# Patient Record
Sex: Female | Born: 1950
Health system: Southern US, Community
[De-identification: ages and names within clinical notes are randomized; demographics above are authoritative.]

## PROBLEM LIST (undated history)

## (undated) DIAGNOSIS — E739 Lactose intolerance, unspecified: Secondary | ICD-10-CM

## (undated) DIAGNOSIS — Z87898 Personal history of other specified conditions: Secondary | ICD-10-CM

## (undated) DIAGNOSIS — M8580 Other specified disorders of bone density and structure, unspecified site: Secondary | ICD-10-CM

## (undated) DIAGNOSIS — G5763 Lesion of plantar nerve, bilateral lower limbs: Secondary | ICD-10-CM

## (undated) DIAGNOSIS — Z860100 Personal history of colon polyps, unspecified: Secondary | ICD-10-CM

## (undated) DIAGNOSIS — M5416 Radiculopathy, lumbar region: Secondary | ICD-10-CM

## (undated) DIAGNOSIS — G8929 Other chronic pain: Secondary | ICD-10-CM

## (undated) DIAGNOSIS — M722 Plantar fascial fibromatosis: Secondary | ICD-10-CM

## (undated) DIAGNOSIS — Z9889 Other specified postprocedural states: Secondary | ICD-10-CM

## (undated) DIAGNOSIS — I351 Nonrheumatic aortic (valve) insufficiency: Secondary | ICD-10-CM

## (undated) DIAGNOSIS — M1990 Unspecified osteoarthritis, unspecified site: Secondary | ICD-10-CM

## (undated) DIAGNOSIS — Z973 Presence of spectacles and contact lenses: Secondary | ICD-10-CM

## (undated) DIAGNOSIS — N200 Calculus of kidney: Secondary | ICD-10-CM

## (undated) DIAGNOSIS — M545 Low back pain, unspecified: Secondary | ICD-10-CM

## (undated) DIAGNOSIS — E785 Hyperlipidemia, unspecified: Secondary | ICD-10-CM

## (undated) DIAGNOSIS — N898 Other specified noninflammatory disorders of vagina: Secondary | ICD-10-CM

## (undated) DIAGNOSIS — H269 Unspecified cataract: Secondary | ICD-10-CM

## (undated) DIAGNOSIS — R112 Nausea with vomiting, unspecified: Secondary | ICD-10-CM

## (undated) DIAGNOSIS — G4700 Insomnia, unspecified: Secondary | ICD-10-CM

## (undated) DIAGNOSIS — G629 Polyneuropathy, unspecified: Secondary | ICD-10-CM

## (undated) DIAGNOSIS — I700 Atherosclerosis of aorta: Secondary | ICD-10-CM

## (undated) DIAGNOSIS — R011 Cardiac murmur, unspecified: Secondary | ICD-10-CM

## (undated) DIAGNOSIS — F419 Anxiety disorder, unspecified: Secondary | ICD-10-CM

## (undated) DIAGNOSIS — M25569 Pain in unspecified knee: Secondary | ICD-10-CM

## (undated) DIAGNOSIS — I493 Ventricular premature depolarization: Secondary | ICD-10-CM

## (undated) DIAGNOSIS — B009 Herpesviral infection, unspecified: Secondary | ICD-10-CM

## (undated) HISTORY — DX: Hyperlipidemia, unspecified: E78.5

## (undated) HISTORY — DX: Anxiety disorder, unspecified: F41.9

## (undated) HISTORY — DX: Personal history of colon polyps, unspecified: Z86.0100

## (undated) HISTORY — DX: Personal history of colonic polyps: Z86.010

## (undated) HISTORY — PX: TONSILLECTOMY AND ADENOIDECTOMY: SUR1326

## (undated) HISTORY — PX: EXCISION MORTON'S NEUROMA: SHX5013

## (undated) HISTORY — DX: Unspecified osteoarthritis, unspecified site: M19.90

## (undated) HISTORY — DX: Insomnia, unspecified: G47.00

## (undated) HISTORY — PX: PLANTAR FASCIA RELEASE: SHX2239

## (undated) HISTORY — DX: Lactose intolerance, unspecified: E73.9

## (undated) HISTORY — DX: Other specified disorders of bone density and structure, unspecified site: M85.80

---

## 1972-03-15 HISTORY — PX: TOTAL ABDOMINAL HYSTERECTOMY: SHX209

## 1999-04-10 ENCOUNTER — Other Ambulatory Visit: Admission: RE | Admit: 1999-04-10 | Discharge: 1999-04-10 | Payer: Self-pay | Admitting: *Deleted

## 2004-12-29 ENCOUNTER — Ambulatory Visit: Payer: Self-pay | Admitting: Pulmonary Disease

## 2005-01-19 ENCOUNTER — Ambulatory Visit: Payer: Self-pay | Admitting: Pulmonary Disease

## 2009-02-20 ENCOUNTER — Ambulatory Visit (HOSPITAL_BASED_OUTPATIENT_CLINIC_OR_DEPARTMENT_OTHER): Admission: RE | Admit: 2009-02-20 | Discharge: 2009-02-20 | Payer: Self-pay | Admitting: Orthopedic Surgery

## 2009-05-08 ENCOUNTER — Ambulatory Visit (HOSPITAL_BASED_OUTPATIENT_CLINIC_OR_DEPARTMENT_OTHER): Admission: RE | Admit: 2009-05-08 | Discharge: 2009-05-08 | Payer: Self-pay | Admitting: Orthopedic Surgery

## 2010-04-02 ENCOUNTER — Ambulatory Visit
Admission: RE | Admit: 2010-04-02 | Discharge: 2010-04-02 | Payer: Self-pay | Source: Home / Self Care | Attending: Orthopedic Surgery | Admitting: Orthopedic Surgery

## 2010-04-08 NOTE — Op Note (Addendum)
  NAMEREEGAN, MCTIGHE NO.:  [id number]  MEDICAL RECORD NO.:  [id number]          PATIENT TYPE:  AMB  LOCATION:  DSC                          FACILITY:  [facility]  PHYSICIAN:  Loreta Ave, M.D. DATE OF BIRTH:  1950-11-24  DATE OF PROCEDURE: DATE OF DISCHARGE:                              OPERATIVE REPORT   PREOPERATIVE DIAGNOSIS:  Chronic recalcitrant plantar fasciitis, left heel.  POSTOPERATIVE DIAGNOSIS:  Chronic recalcitrant plantar fasciitis, left heel.  PROCEDURE:  Endoscopic plantar fascia release, left heel.  SURGEON:  Loreta Ave, M.D.  ASSISTANT:  Zonia Kief, PA.  ANESTHESIA:  General.  BLOOD LOSS:  Minimal.  SPECIMENS:  None.  CULTURES:  None.  COMPLICATIONS:  None.  DRESSING:  Soft compressive with wooden shoe.  TOURNIQUET TIME:  30 minutes.  PROCEDURE:  The patient was brought to the operating room, placed on the operating table in supine position.  After adequate anesthesia had been obtained, calf tourniquet applied.  Prepped and draped in usual sterile fashion.  Exsanguinated with elevation and Esmarch.  Tourniquet was inflated to 250 mmHg.  With fluoroscopic guidance, the attachment of the plantar fascia to the os calcis identified from medial to lateral. Small incisions made on either side.  A spreading spatula was then used to clear the tissue off the plantar side of the plantar fascia, and the cannula for plantar fascia release was inserted from one side to the other.  Fluoroscopy used again to confirm good position.  Looking through the cannula with the endoscopic instruments, plantar fascia was identified.  Divided under direct visualization from medial to lateral down to the muscle layer below.  Neurovascular structures on either side protected throughout.  Once I confirmed a good release, wound was irrigated.  The cannula was removed.  Portals closed with nylon. Sterile compressive dressing applied.  Tourniquet  inflated was removed. Anesthesia reversed.  Brought to recovery room.  Tolerated surgery well. No complications.     Loreta Ave, M.D.     DFM/MEDQ  D:  04/02/2010  T:  04/03/2010  Job:  [id number]  Electronically Signed by Mckinley Jewel M.D. on 04/08/2010 04:21:47 PM

## 2010-04-30 ENCOUNTER — Encounter: Payer: Self-pay | Admitting: Sports Medicine

## 2010-05-06 ENCOUNTER — Encounter: Payer: Self-pay | Admitting: Sports Medicine

## 2010-05-06 ENCOUNTER — Encounter (INDEPENDENT_AMBULATORY_CARE_PROVIDER_SITE_OTHER): Payer: BC Managed Care – PPO | Admitting: Sports Medicine

## 2010-05-06 DIAGNOSIS — M79609 Pain in unspecified limb: Secondary | ICD-10-CM | POA: Insufficient documentation

## 2010-05-06 DIAGNOSIS — M7750 Other enthesopathy of unspecified foot: Secondary | ICD-10-CM

## 2010-05-06 DIAGNOSIS — M722 Plantar fascial fibromatosis: Secondary | ICD-10-CM | POA: Insufficient documentation

## 2010-05-12 NOTE — Assessment & Plan Note (Signed)
Summary: PER WAINER, MAY NEED ORTHOTICS, PF RELEASE IN JAN   Vital Signs:  Patient profile:   60 year old female Height:      65 inches Weight:      170 pounds BMI:     28.39 BP sitting:   111 / 66  Vitals Entered By: Lillia Pauls CMA (May 06, 2010 11:33 AM)  History of Present Illness: Patient had endoscopic plantar fascial release January 19th. Has some persisent pain and swelling but it is much improved. She started experiencing pain with all movement in April. Patient tried cortizone shot and night splint with no relief. Patient decided to have MRI and surgery rather than waiting for a year.  Patient has had 3 morton's neuroma - 2 from left and 1 removed from right At least 3 year hx of chronic bilat foot pain has tried lots of OTC inserts and pads  December 2010 - was first surgery - left foot morton's neuroma February 2011 - left and right moron's neuroma removed January 2012 - Endoscopic plantar fascial release  Patient has tried many different shoes and inserts in the past. Patient states she is not able to walk 1 mile without discomfort in left foot at this time.    Physical Exam  General:  Well-developed,well-nourished,in no acute distress; alert,appropriate and cooperative throughout examination Msk:  Hip: MS 5/5 hip flexion/extension MS 4/5 hip abduction Negative straight leg Negative FABERE  Ankle: Normal ROM in ankles b/l No joint laxity Anterior drawer negative b/l Talar tilt negative b/l  MS 5/5 Plantarflexion, Dorsiflexion, Inversion, Eversion of foot b/l  Morton's foot High longitudinal arch No visible transverse arch collapse Crowding of digit with hammering of 3rd DIP b/l bunionettes bilat some TTP under MT heads   Gait analysis: -Patient favoring left side now due to recent surgery   Impression & Recommendations:  Problem # 1:  PLANTAR FASCIITIS, LEFT (ICD-728.71) Assessment New Patient's left foot pain improving since endoscopic  surgical release. Patient with history of 2 Morton's neuromas removed on left foot and one removed on right foot. Patient with weakness of hip abduction more pronounced on left side.  Patient has several of her own insoles. Patient to try wearing 3 different insoles each with metatarsal pads over the next month. Patient to follow up in one month to determine which insole is best for her. If patient still having pain at that time will determine if custom orthotic is needed. Patient given hip strengthening exercises (hip abduction 3x30 daily, standing hip rotation 3x15 daily)  Problem # 2:  METATARSALGIA (ICD-726.70)  we tried adding MT pads to several inserts we settled on using 3 to try to see if these give relief and can stop the recurrent Morton's neuromas  Orders: Foot Orthosis ( Arch Strap/Heel Cup) [phone])  Problem # 3:  FOOT PAIN, BILATERAL (ICD-729.5) I think with all she has invested in shoe support we will try to use what she has to give her ideal support If this is successful cont with the OTC products  if not helping p 1 month try custom orthotics  Patient Instructions: 1)  Alternate red insole, tan insole and blue insole daily in work shoes to determine which insole feels the best. 2)  Try red insole in sports insole. 3)  Follow up in one month to determine which insole will be best for you. 4)  Excersises 3 sets of 30 Hip Abduction and 3 sets of 15 Standing hip rotation daily.   Orders Added:  1)  New Patient Level III [16109] 2)  Foot Orthosis ( Arch Strap/Heel Cup) [phone]

## 2010-06-04 LAB — POCT HEMOGLOBIN-HEMACUE: Hemoglobin: 14.5 g/dL (ref 12.0–15.0)

## 2010-06-16 LAB — POCT HEMOGLOBIN-HEMACUE: Hemoglobin: 13.4 g/dL (ref 12.0–15.0)

## 2010-09-10 ENCOUNTER — Ambulatory Visit (HOSPITAL_BASED_OUTPATIENT_CLINIC_OR_DEPARTMENT_OTHER)
Admission: RE | Admit: 2010-09-10 | Discharge: 2010-09-10 | Disposition: A | Discharge status: Home / Self Care | Payer: BC Managed Care – PPO | Source: Ambulatory Visit | Attending: Orthopedic Surgery | Admitting: Orthopedic Surgery

## 2010-09-10 DIAGNOSIS — M7710 Lateral epicondylitis, unspecified elbow: Secondary | ICD-10-CM | POA: Insufficient documentation

## 2010-09-10 DIAGNOSIS — H3530 Unspecified macular degeneration: Secondary | ICD-10-CM | POA: Insufficient documentation

## 2010-09-10 DIAGNOSIS — M129 Arthropathy, unspecified: Secondary | ICD-10-CM | POA: Insufficient documentation

## 2010-09-10 DIAGNOSIS — M249 Joint derangement, unspecified: Secondary | ICD-10-CM | POA: Insufficient documentation

## 2010-09-10 DIAGNOSIS — G5630 Lesion of radial nerve, unspecified upper limb: Secondary | ICD-10-CM | POA: Insufficient documentation

## 2010-09-10 DIAGNOSIS — Z01812 Encounter for preprocedural laboratory examination: Secondary | ICD-10-CM | POA: Insufficient documentation

## 2010-09-10 HISTORY — PX: ELBOW DEBRIDEMENT: SHX931

## 2010-09-10 LAB — POCT HEMOGLOBIN-HEMACUE: Hemoglobin: 13.3 g/dL (ref 12.0–15.0)

## 2010-09-25 NOTE — Op Note (Signed)
  NAMELEOMIA, BLAKE NO.:  [id number]  MEDICAL RECORD NO.:  [id number]  LOCATION:  DSC                          FACILITY:  [facility]  PHYSICIAN:  Loreta Ave, M.D. DATE OF BIRTH:  [birth year]  DATE OF PROCEDURE:  09/10/2010 DATE OF DISCHARGE:  04/02/2010                              OPERATIVE REPORT   PREOPERATIVE DIAGNOSES: 1. Right elbow chronic lateral epicondylitis with tearing extensor     carpi radialis brevis tendon. 2. Radial tunnel syndrome, proximal forearm right.  POSTOPERATIVE DIAGNOSES: 1. Right elbow chronic lateral epicondylitis with tearing extensor     carpi radialis brevis tendon. 2. Radial tunnel syndrome, proximal forearm right.  PROCEDURES: 1. Right elbow lateral exploration.  Debridement of ECRB tendon.     Debridement drilling, lateral epicondyle.  Repair of superficial     extensors over defect. 2. Radial tunnel release.  SURGEON:  Loreta Ave, MD  ASSISTANT:  Genene Churn. Barry Dienes, [place]  ANESTHESIA:  General.  BLOOD LOSS:  Minimal.  SPECIMENS:  None.  CULTURES:  None.  COMPLICATIONS:  None.  DRESSING:  Soft compressive with a sugar-tong splint.  TOURNIQUET TIME:  Forty-five minutes.  PROCEDURE IN DETAIL:  The patient was brought to the operating room, placed in the operating table in supine position.  After adequate anesthesia had been obtained, tourniquet applied, prepped and draped in usual sterile fashion.  Exsanguinated with elevation, Esmarch tourniquet inflated to 50 mmHg.  Attention first turned lateral.  Incision from the epicondyle distal.  Skin and  subcutaneous tissue divided.  Superficial extensors intact, divided longitudinally, exposing ECRB.  Marked mucinous degeneration tearing of the tendon and capsule from the lateral epicondyle down the level of the radial head.  All the abnormal tissue excised.  Joint inspected.  No instability or chondromalacia.  Wound irrigated.  Epicondyle debrided.  Treated  with multiple drilling. Superficial extensors closed over the defect with Vicryl.  Wound irrigated and closed subcutaneous and subcuticular with Vicryl. Attention turned to the radial tunnel.  A volar incision, proximal volar aspect of forearm adjacent to the extensor muscles.  Skin and subcutaneous tissue divided.  The interval by the extensor is developed, they are retracted, revealing superficial branch of the radial nerve. This was followed back to the bifurcation.  The motor branch followed distally.  Numerous crossing venous structures treated with bipolar cautery.  Moderate constriction at the arcade of [place].  The tendon aponeurosis and muscle were split from there by 2 cm distal. Obliterating all pressure on the nerve, both from the venous structures and tendon.  The superficial and deep branch inspected intact throughout completion.  Wound irrigated.  Closed subcutaneous and subcuticular Vicryl.  Sterile compressive dressing applied.  Sugar-tong splint applied.  Tourniquet deflated and removed.  Anesthesia reversed. Brought to the recovery room.  Tolerated surgery well.  No complications.     Loreta Ave, M.D.     DFM/MEDQ  D:  09/10/2010  T:  09/11/2010  Job:  [id number]  Electronically Signed by Mckinley Jewel M.D. on 09/25/2010 08:25:56 AM

## 2012-08-31 ENCOUNTER — Encounter: Payer: Self-pay | Admitting: Internal Medicine

## 2012-08-31 ENCOUNTER — Ambulatory Visit (INDEPENDENT_AMBULATORY_CARE_PROVIDER_SITE_OTHER): Payer: BC Managed Care – PPO | Admitting: Internal Medicine

## 2012-08-31 VITALS — BP 130/80 | HR 56 | Ht 65.5 in | Wt 173.6 lb

## 2012-08-31 DIAGNOSIS — R002 Palpitations: Secondary | ICD-10-CM | POA: Insufficient documentation

## 2012-08-31 NOTE — Progress Notes (Signed)
OFFICE NOTE  Chief Complaint:  Palpitations  Primary Care Physician: Jearld Lesch, MD  HPI:  Lorraine [name] is a pleasant 62 year old female who works as a IT sales professional at [employer].  She was referred to [place] for evaluation of missed or skipped beats. She does not report any awareness of palpitations, however she notices that she occasionally skips beats. This actually may occur on a daily basis. Has been present for many years, however recently was brought to her attention by her primary care doctor. She is actually fairly active, and belongs to the [employer]. She is often swimming or doing land exercises, and does not have any significant limitation to exercise. She denies any chest pain or worsening shortness of breath. Recently she was trying to lose weight and was prescribed phentermine. This did cause marked increase in her palpitations and she discontinued the medicine secondary to that.  PMHx:  Past Medical History  Diagnosis Date  . Insomnia   . Arthritis   . Hyperlipidemia     Past Surgical History  Procedure Laterality Date  . Tonsillectomy  child  . Abdominal hysterectomy  1974  . Morton neuroma  2010, 2011    R & L foot  . Plantar fascitis  2012    L foot  . R elbow surgery  08/2010    FAMHx:  History reviewed. No pertinent family history. No history of palpitations in the family.  SOCHx:   reports that she has never smoked. She has never used smokeless tobacco. She reports that she does not drink alcohol or use illicit drugs.  ALLERGIES:  Allergies  Allergen Reactions  . Codeine Nausea Only    ROS: A comprehensive review of systems was negative except for: Cardiovascular: positive for palpitations  HOME MEDS: Current Outpatient Prescriptions  Medication Sig Dispense Refill  . ALPRAZolam (XANAX) 0.5 MG tablet Take 0.5 mg by mouth as needed for sleep.      Marland Kitchen amoxicillin (AMOXIL) 875 MG tablet Take 875 mg by mouth 2 (two) times daily.      .  famotidine (PEPCID) 20 MG tablet Take 20 mg by mouth as needed for heartburn.      Marland Kitchen KRILL OIL PO Take 1 capsule by mouth daily.      . meloxicam (MOBIC) 15 MG tablet Take 15 mg by mouth daily.      . NON FORMULARY Take 1 each by mouth daily. Pure Magnesium Plus (takes a few times a week)      . Probiotic Product (PROBIOTIC DAILY PO) Take 1 tablet by mouth daily. Align      . traMADol (ULTRAM) 50 MG tablet Take 50 mg by mouth every 6 (six) hours as needed for pain.      Marland Kitchen triazolam (HALCION) 0.25 MG tablet Take 0.25 mg by mouth at bedtime.       No current facility-administered medications for this visit.    LABS/IMAGING: No results found for this or any previous visit (from the past 48 hour(s)). No results found.  VITALS: BP 130/80  Pulse 56  Ht 5' 5.5" (1.664 m)  Wt 173 lb 9.6 oz (78.744 kg)  BMI 28.44 kg/m2  EXAM: General appearance: alert and no distress Neck: no adenopathy, no carotid bruit, no JVD, supple, symmetrical, trachea midline and thyroid not enlarged, symmetric, no tenderness/mass/nodules Lungs: clear to auscultation bilaterally Heart: regular rate and rhythm and occasional missed beats Abdomen: soft, non-tender; bowel sounds normal; no masses,  no organomegaly Extremities: extremities normal, atraumatic,  no cyanosis or edema Pulses: 2+ and symmetric Skin: Skin color, texture, turgor normal. No rashes or lesions Neurologic: Grossly normal  EKG: Sinus bradycardia with a fusion beat at 56  ASSESSMENT: 1. Extrasystoles, which appear to be fusion of the beats or PVCs that are nonconducted 2. Dyslipidemia 3. Age risk of coronary disease  PLAN: 1.   Ms. Hornbrook is having extra beats which are nonconducted. These appear to be fusion beats. She is unaware of them, but most likely is having a good part of her life. Given her age, however I would like to rule out ischemia as a possible cause. I would also like to see the response of her heart with exercise. Thank you  treadmill stress test is appropriate. If that is negative, would not recommend any further treatment. These could be suppressed with a beta blocker, however since she is asymptomatic, I do not see any benefit with that. She should continue to work on modifying her risk, and also work on lowering cholesterol.  I will be contacted with the results of her stress test. Thanks again for the kind referral.  Chrystie Nose, MD, [facility] Attending Cardiologist [facility]  HILTY,Kenneth C 08/31/2012, 10:13 AM

## 2012-08-31 NOTE — Patient Instructions (Addendum)
Your physician wants you to follow-up as needed.   Your physician has requested that you have en exercise stress myoview. For further information please visit [email or web address] Please follow instruction sheet, as given. We will call you with the results.

## 2012-09-01 ENCOUNTER — Encounter: Payer: Self-pay | Admitting: Internal Medicine

## 2012-09-06 ENCOUNTER — Ambulatory Visit (HOSPITAL_COMMUNITY)
Admission: RE | Admit: 2012-09-06 | Discharge: 2012-09-06 | Disposition: A | Discharge status: Home / Self Care | Payer: BC Managed Care – PPO | Source: Ambulatory Visit | Attending: Internal Medicine | Admitting: Internal Medicine

## 2012-09-06 ENCOUNTER — Encounter (HOSPITAL_COMMUNITY): Payer: BC Managed Care – PPO

## 2012-09-06 DIAGNOSIS — R002 Palpitations: Secondary | ICD-10-CM | POA: Insufficient documentation

## 2012-09-18 ENCOUNTER — Telehealth: Payer: Self-pay | Admitting: *Deleted

## 2012-09-18 NOTE — Telephone Encounter (Signed)
Message from Dr. Rennis Golden:  Please notify patient that the stress test results were low-risk. I don't think her extra beats (PVC's) are due to ischemia (blockages). -Dr. Rennis Golden   Call to Lorraine [name] and results given per MD.  Lorraine [name] verbalized understanding.

## 2013-03-15 HISTORY — PX: CATARACT EXTRACTION W/ INTRAOCULAR LENS  IMPLANT, BILATERAL: SHX1307

## 2014-01-13 HISTORY — PX: ANTERIOR CERVICAL DECOMP/DISCECTOMY FUSION: SHX1161

## 2014-01-26 ENCOUNTER — Encounter (HOSPITAL_COMMUNITY): Payer: Self-pay

## 2014-01-26 ENCOUNTER — Inpatient Hospital Stay (HOSPITAL_COMMUNITY): Payer: BC Managed Care – PPO

## 2014-01-26 ENCOUNTER — Inpatient Hospital Stay (HOSPITAL_COMMUNITY)
Admission: EM | Admit: 2014-01-26 | Discharge: 2014-01-30 | DRG: 552 | Disposition: A | Discharge status: Home / Self Care | Payer: BC Managed Care – PPO | Attending: Neurosurgery | Admitting: Neurosurgery

## 2014-01-26 DIAGNOSIS — M1990 Unspecified osteoarthritis, unspecified site: Secondary | ICD-10-CM | POA: Diagnosis present

## 2014-01-26 DIAGNOSIS — E785 Hyperlipidemia, unspecified: Secondary | ICD-10-CM | POA: Diagnosis present

## 2014-01-26 DIAGNOSIS — Z7952 Long term (current) use of systemic steroids: Secondary | ICD-10-CM | POA: Diagnosis not present

## 2014-01-26 DIAGNOSIS — R112 Nausea with vomiting, unspecified: Secondary | ICD-10-CM | POA: Diagnosis present

## 2014-01-26 DIAGNOSIS — R52 Pain, unspecified: Secondary | ICD-10-CM | POA: Diagnosis present

## 2014-01-26 DIAGNOSIS — M25512 Pain in left shoulder: Secondary | ICD-10-CM

## 2014-01-26 DIAGNOSIS — G8918 Other acute postprocedural pain: Secondary | ICD-10-CM

## 2014-01-26 DIAGNOSIS — Z79891 Long term (current) use of opiate analgesic: Secondary | ICD-10-CM

## 2014-01-26 DIAGNOSIS — D72829 Elevated white blood cell count, unspecified: Secondary | ICD-10-CM | POA: Diagnosis present

## 2014-01-26 DIAGNOSIS — E860 Dehydration: Secondary | ICD-10-CM | POA: Diagnosis present

## 2014-01-26 DIAGNOSIS — Z79899 Other long term (current) drug therapy: Secondary | ICD-10-CM | POA: Diagnosis not present

## 2014-01-26 DIAGNOSIS — M542 Cervicalgia: Principal | ICD-10-CM | POA: Diagnosis present

## 2014-01-26 DIAGNOSIS — Z90710 Acquired absence of both cervix and uterus: Secondary | ICD-10-CM

## 2014-01-26 DIAGNOSIS — M25519 Pain in unspecified shoulder: Secondary | ICD-10-CM

## 2014-01-26 DIAGNOSIS — M79602 Pain in left arm: Secondary | ICD-10-CM

## 2014-01-26 LAB — URINALYSIS, ROUTINE W REFLEX MICROSCOPIC
Bilirubin Urine: NEGATIVE
Glucose, UA: NEGATIVE mg/dL
Hgb urine dipstick: NEGATIVE
Ketones, ur: 40 mg/dL — AB
NITRITE: NEGATIVE
PROTEIN: NEGATIVE mg/dL
SPECIFIC GRAVITY, URINE: 1.018 (ref 1.005–1.030)
UROBILINOGEN UA: 0.2 mg/dL (ref 0.0–1.0)
pH: 6 (ref 5.0–8.0)

## 2014-01-26 LAB — CBC WITH DIFFERENTIAL/PLATELET
BASOS PCT: 0 % (ref 0–1)
Basophils Absolute: 0 10*3/uL (ref 0.0–0.1)
EOS ABS: 0.1 10*3/uL (ref 0.0–0.7)
Eosinophils Relative: 1 % (ref 0–5)
HEMATOCRIT: 43.6 % (ref 36.0–46.0)
Hemoglobin: 14.7 g/dL (ref 12.0–15.0)
Lymphocytes Relative: 26 % (ref 12–46)
Lymphs Abs: 2.8 10*3/uL (ref 0.7–4.0)
MCH: 28.5 pg (ref 26.0–34.0)
MCHC: 33.7 g/dL (ref 30.0–36.0)
MCV: 84.7 fL (ref 78.0–100.0)
Monocytes Absolute: 0.8 10*3/uL (ref 0.1–1.0)
Monocytes Relative: 7 % (ref 3–12)
NEUTROS ABS: 7 10*3/uL (ref 1.7–7.7)
NEUTROS PCT: 66 % (ref 43–77)
PLATELETS: 361 10*3/uL (ref 150–400)
RBC: 5.15 MIL/uL — ABNORMAL HIGH (ref 3.87–5.11)
RDW: 14.3 % (ref 11.5–15.5)
WBC: 10.7 10*3/uL — AB (ref 4.0–10.5)

## 2014-01-26 LAB — BASIC METABOLIC PANEL
ANION GAP: 17 — AB (ref 5–15)
BUN: 23 mg/dL (ref 6–23)
CHLORIDE: 106 meq/L (ref 96–112)
CO2: 20 mEq/L (ref 19–32)
Calcium: 9.3 mg/dL (ref 8.4–10.5)
Creatinine, Ser: 0.65 mg/dL (ref 0.50–1.10)
Glucose, Bld: 97 mg/dL (ref 70–99)
POTASSIUM: 4 meq/L (ref 3.7–5.3)
SODIUM: 143 meq/L (ref 137–147)

## 2014-01-26 LAB — URINE MICROSCOPIC-ADD ON

## 2014-01-26 MED ORDER — PANTOPRAZOLE SODIUM 40 MG IV SOLR
40.0000 mg | Freq: Two times a day (BID) | INTRAVENOUS | Status: DC
  Administered 2014-01-26 – 2014-01-27 (×3): 40 mg via INTRAVENOUS
  Filled 2014-01-26 (×3): qty 40

## 2014-01-26 MED ORDER — SODIUM CHLORIDE 0.9 % IJ SOLN: 3.0000 mL | INTRAMUSCULAR | Status: DC | PRN

## 2014-01-26 MED ORDER — SODIUM CHLORIDE 0.9 % IJ SOLN
3.0000 mL | Freq: Two times a day (BID) | INTRAMUSCULAR | Status: DC
  Administered 2014-01-26: 3 mL via INTRAVENOUS

## 2014-01-26 MED ORDER — POLYETHYLENE GLYCOL 3350 17 G PO PACK
17.0000 g | PACK | Freq: Every day | ORAL | Status: DC | PRN
  Administered 2014-01-28: 17 g via ORAL
  Filled 2014-01-26: qty 1

## 2014-01-26 MED ORDER — ALUM & MAG HYDROXIDE-SIMETH 200-200-20 MG/5ML PO SUSP: 30.0000 mL | Freq: Four times a day (QID) | ORAL | Status: DC | PRN

## 2014-01-26 MED ORDER — ONDANSETRON HCL 4 MG/2ML IJ SOLN
4.0000 mg | Freq: Once | INTRAMUSCULAR | Status: AC
Start: 2014-01-26 — End: 2014-01-26
  Administered 2014-01-26: 4 mg via INTRAVENOUS
  Filled 2014-01-26: qty 2

## 2014-01-26 MED ORDER — BISACODYL 5 MG PO TBEC
5.0000 mg | DELAYED_RELEASE_TABLET | Freq: Every day | ORAL | Status: DC | PRN
  Administered 2014-01-27 – 2014-01-28 (×2): 5 mg via ORAL
  Filled 2014-01-26: qty 1

## 2014-01-26 MED ORDER — LORAZEPAM 2 MG/ML IJ SOLN
1.0000 mg | Freq: Once | INTRAMUSCULAR | Status: AC
  Administered 2014-01-26: 1 mg via INTRAVENOUS
  Filled 2014-01-26: qty 1

## 2014-01-26 MED ORDER — TRIAZOLAM 0.125 MG PO TABS
0.2500 mg | ORAL_TABLET | Freq: Every day | ORAL | Status: DC
  Administered 2014-01-26 – 2014-01-29 (×4): 0.25 mg via ORAL
  Filled 2014-01-26 (×4): qty 2

## 2014-01-26 MED ORDER — OXYCODONE HCL 5 MG PO TABS
5.0000 mg | ORAL_TABLET | ORAL | Status: DC | PRN
  Administered 2014-01-27: 5 mg via ORAL
  Filled 2014-01-26: qty 1

## 2014-01-26 MED ORDER — ACETAMINOPHEN 650 MG RE SUPP: 650.0000 mg | Freq: Four times a day (QID) | RECTAL | Status: DC | PRN

## 2014-01-26 MED ORDER — MAGNESIUM CITRATE PO SOLN: 1.0000 | Freq: Once | ORAL | Status: AC | PRN

## 2014-01-26 MED ORDER — HYDROMORPHONE HCL 1 MG/ML IJ SOLN
1.0000 mg | INTRAMUSCULAR | Status: DC | PRN
  Administered 2014-01-26 – 2014-01-27 (×4): 1 mg via INTRAVENOUS
  Filled 2014-01-26 (×4): qty 1

## 2014-01-26 MED ORDER — ACETAMINOPHEN 325 MG PO TABS
650.0000 mg | ORAL_TABLET | Freq: Four times a day (QID) | ORAL | Status: DC | PRN
  Administered 2014-01-29: 650 mg via ORAL
  Filled 2014-01-26: qty 2

## 2014-01-26 MED ORDER — HEPARIN SODIUM (PORCINE) 5000 UNIT/ML IJ SOLN
5000.0000 [IU] | Freq: Three times a day (TID) | INTRAMUSCULAR | Status: DC
  Administered 2014-01-26 – 2014-01-29 (×9): 5000 [IU] via SUBCUTANEOUS
  Filled 2014-01-26 (×12): qty 1

## 2014-01-26 MED ORDER — DIAZEPAM 5 MG PO TABS
5.0000 mg | ORAL_TABLET | Freq: Four times a day (QID) | ORAL | Status: DC | PRN
  Administered 2014-01-27 – 2014-01-30 (×8): 5 mg via ORAL
  Filled 2014-01-26 (×9): qty 1

## 2014-01-26 MED ORDER — SENNA 8.6 MG PO TABS
1.0000 | ORAL_TABLET | Freq: Two times a day (BID) | ORAL | Status: DC
  Administered 2014-01-26 – 2014-01-29 (×7): 8.6 mg via ORAL
  Filled 2014-01-26 (×8): qty 1

## 2014-01-26 MED ORDER — SODIUM CHLORIDE 0.9 % IV SOLN: 250.0000 mL | INTRAVENOUS | Status: DC | PRN

## 2014-01-26 MED ORDER — HYDROMORPHONE HCL 1 MG/ML IJ SOLN
1.0000 mg | Freq: Once | INTRAMUSCULAR | Status: AC
  Administered 2014-01-26: 1 mg via INTRAVENOUS
  Filled 2014-01-26: qty 1

## 2014-01-26 MED ORDER — POTASSIUM CHLORIDE IN NACL 20-0.9 MEQ/L-% IV SOLN
INTRAVENOUS | Status: DC
  Administered 2014-01-26 – 2014-01-28 (×3): via INTRAVENOUS
  Filled 2014-01-26 (×6): qty 1000

## 2014-01-26 MED ORDER — SODIUM CHLORIDE 0.9 % IV SOLN
INTRAVENOUS | Status: DC
  Administered 2014-01-26: 16:00:00 via INTRAVENOUS

## 2014-01-26 MED ORDER — SODIUM CHLORIDE 0.9 % IV BOLUS (SEPSIS)
2000.0000 mL | Freq: Once | INTRAVENOUS | Status: AC
  Administered 2014-01-26: 2000 mL via INTRAVENOUS

## 2014-01-26 MED ORDER — ONDANSETRON HCL 4 MG/2ML IJ SOLN
4.0000 mg | Freq: Four times a day (QID) | INTRAMUSCULAR | Status: DC | PRN
  Administered 2014-01-27: 4 mg via INTRAVENOUS
  Filled 2014-01-26: qty 2

## 2014-01-26 MED ORDER — ONDANSETRON HCL 4 MG PO TABS
4.0000 mg | ORAL_TABLET | Freq: Four times a day (QID) | ORAL | Status: DC | PRN
  Administered 2014-01-27 – 2014-01-28 (×2): 4 mg via ORAL
  Filled 2014-01-26 (×2): qty 1

## 2014-01-26 NOTE — ED Notes (Signed)
Dr. Christella Noa arrived here at about 1645 hours; and is still speaking with pt.  She is pleased to find she will be admitted.  Dr. Christella Noa has informed her she is to be admitted at [facility], with which she agrees.

## 2014-01-26 NOTE — Progress Notes (Signed)
Patient  Arrived from [facility] E.D patient alert and oriented X4  Oriented to unit and Room. Assisted to bathroom one assist voided without difficulty.

## 2014-01-26 NOTE — ED Provider Notes (Signed)
CSN: [id number]     Arrival date & time 01/26/14  1256 History   First MD Initiated Contact with Patient 01/26/14 1327     Chief Complaint  Patient presents with  . Nausea     (Consider location/radiation/quality/duration/timing/severity/associated sxs/prior Treatment) HPI Comments: Patient here due to worsening nausea and vomiting and left upper extremity pain. Patient had outpatient surgery on her C-spine 12 days ago and saw her neurosurgeon yesterday, Dr. Hal Neer, for follow-up and was placed on a different pain regimen of Valium and hydromorphone. She denies any fever or chills. Denies any neck pain. Denies any weakness to her arm. Pain characterized as sharp and localized to her left arm and worse with movement. Denies any numbness or tingling to her left hand. Has been unable to keep down her pain medications at this time. States that over the past week she has had decreased oral intake and as well body weakness. No change in bowel or bladder function.  The history is provided by the patient.    Past Medical History  Diagnosis Date  . Insomnia   . Arthritis   . Hyperlipidemia    Past Surgical History  Procedure Laterality Date  . Tonsillectomy  child  . Abdominal hysterectomy  1974  . Morton neuroma  2010, 2011    R & L foot  . Plantar fascitis  2012    L foot  . R elbow surgery  08/2010  . Cervical spine surgery     No family history on file. History  Substance Use Topics  . Smoking status: Never Smoker   . Smokeless tobacco: Never Used  . Alcohol Use: No   OB History    No data available     Review of Systems  All other systems reviewed and are negative.     Allergies  Codeine  Home Medications   Prior to Admission medications   Medication Sig Start Date End Date Taking? Authorizing Provider  ALPRAZolam Duanne Moron) 0.5 MG tablet Take 0.5 mg by mouth as needed for sleep.    Historical Provider, MD  famotidine (PEPCID) 20 MG tablet Take 20 mg by mouth as  needed for heartburn.    Historical Provider, MD  Darcey Nora SUSP  12/31/13   Historical Provider, MD  meloxicam (MOBIC) 15 MG tablet Take 15 mg by mouth daily.    Historical Provider, MD  NON FORMULARY Take 1 each by mouth daily. Pure Magnesium Plus (takes a few times a week)    Historical Provider, MD  Oxycodone HCl 10 MG TABS  01/02/14   Historical Provider, MD  predniSONE (STERAPRED UNI-PAK) 5 MG TABS tablet  01/21/14   Historical Provider, MD  Probiotic Product (PROBIOTIC DAILY PO) Take 1 tablet by mouth daily. Align    Historical Provider, MD  traMADol (ULTRAM) 50 MG tablet Take 50 mg by mouth every 6 (six) hours as needed for pain.    Historical Provider, MD  triazolam (HALCION) 0.25 MG tablet Take 0.25 mg by mouth at bedtime.    Historical Provider, MD   BP 158/57 mmHg  Pulse 73  Temp(Src) 98.7 F (37.1 C) (Oral)  Resp 16  SpO2 100% Physical Exam  Constitutional: She is oriented to person, place, and time. She appears well-developed and well-nourished.  Non-toxic appearance. No distress.  HENT:  Head: Normocephalic and atraumatic.  Eyes: Conjunctivae, EOM and lids are normal. Pupils are equal, round, and reactive to light.  Neck: Normal range of motion. Neck supple. No tracheal deviation present.  No thyroid mass present.    Cardiovascular: Normal rate, regular rhythm and normal heart sounds.  Exam reveals no gallop.   No murmur heard. Pulmonary/Chest: Effort normal and breath sounds normal. No stridor. No respiratory distress. She has no decreased breath sounds. She has no wheezes. She has no rhonchi. She has no rales.  Abdominal: Soft. Normal appearance and bowel sounds are normal. She exhibits no distension. There is no tenderness. There is no rebound and no CVA tenderness.  Musculoskeletal: Normal range of motion. She exhibits no edema or tenderness.  Neurological: She is alert and oriented to person, place, and time. She has normal strength. No cranial nerve deficit or sensory  deficit. GCS eye subscore is 4. GCS verbal subscore is 5. GCS motor subscore is 6.  Skin: Skin is warm and dry. No abrasion and no rash noted.  Psychiatric: She has a normal mood and affect. Her speech is normal and behavior is normal.  Nursing note and vitals reviewed.   ED Course  Procedures (including critical care time) Labs Review Labs Reviewed  URINE CULTURE  CBC WITH DIFFERENTIAL  BASIC METABOLIC PANEL  URINALYSIS, ROUTINE W REFLEX MICROSCOPIC    Imaging Review No results found.   EKG Interpretation None      MDM   Final diagnoses:  None    Patient given pain meds here and still feels weak. Dehydration evidenced on her blood work. Spoke with neurosurgeon and he will come to consult on the patient. Patient will be admitted by triad hospitalist for pain management    Leota Jacobsen, MD 01/26/14 1616

## 2014-01-26 NOTE — H&P (Addendum)
[facility] Consult Note.   Haely Leyland ZOX:[id number] DOB: 11-Oct-1950 DOA: 01/26/2014  Referring physician: Dr Zenia Resides.  PCP: Harvie Junior, MD   Chief Complaint: Neck, shoulder pain, severe. And Nausea.   HPI: Lorraine [name] is a 63 y.o. female with PMH significant for hyperlipidemia, Arthritis, PVC or extrasystole,  recent C spinal surgery done by Dr Cyril Mourning 11-02  Who presents with severe neck, shoulder pain. Patient relates that 3 days post surgery she started to have this pain, 10/10, no weakness. She has try multiples pain medications. Dr Cyril Mourning saw her day prior to admission and prescribe valium and oral dilaudid. She has not been able to keep this medications down due to nausea and vomiting.  She has had nausea and vomiting for last week. Unable to eat. She has not had a BM for a week now. She has pass some gas.   She denies chest pain, dyspnea, abdominal pain. No lower extremities edema.    Review of Systems:  Negative, except as per HPI.   Past Medical History  Diagnosis Date  . Insomnia   . Arthritis   . Hyperlipidemia    Past Surgical History  Procedure Laterality Date  . Tonsillectomy  child  . Abdominal hysterectomy  1974  . Morton neuroma  2010, 2011    R & L foot  . Plantar fascitis  2012    L foot  . R elbow surgery  08/2010  . Cervical spine surgery     Social History:  reports that she has never smoked. She has never used smokeless tobacco. She reports that she does not drink alcohol or use illicit drugs.  Allergies  Allergen Reactions  . Codeine Nausea Only   Family History;  Father dies of PNA, history of Alzheimer.  Mother; History of liver cancer.   Prior to Admission medications   Medication Sig Start Date End Date Taking? Authorizing Provider  ALPRAZolam Duanne Moron) 0.5 MG tablet Take 0.5 mg by mouth as needed for sleep.   Yes Historical Provider, MD  diazepam (VALIUM) 5 MG tablet Take 5 mg by mouth 3 (three) times daily as needed for  anxiety.   Yes Historical Provider, MD  famotidine (PEPCID) 20 MG tablet Take 20 mg by mouth as needed for heartburn.   Yes Historical Provider, MD  Hydrocodone-Acetaminophen (VICODIN) 5-300 MG TABS Take 1 tablet by mouth every 4 (four) hours as needed (pain).   Yes Historical Provider, MD  HYDROmorphone (DILAUDID) 4 MG tablet Take 4 mg by mouth every 4 (four) hours as needed for severe pain.   Yes Historical Provider, MD  meloxicam (MOBIC) 15 MG tablet Take 15 mg by mouth daily.   Yes Historical Provider, MD  NON FORMULARY Take 1 each by mouth daily. Pure Magnesium Plus (takes a few times a week)   Yes Historical Provider, MD  traMADol (ULTRAM) 50 MG tablet Take 50 mg by mouth every 6 (six) hours as needed for pain.   Yes Historical Provider, MD  triazolam (HALCION) 0.25 MG tablet Take 0.25 mg by mouth at bedtime.   Yes Historical Provider, MD  Darcey Nora SUSP  12/31/13   Historical Provider, MD  Oxycodone HCl 10 MG TABS Take 10 mg by mouth every 4 (four) hours as needed (pain).  01/02/14   Historical Provider, MD  predniSONE (STERAPRED UNI-PAK) 5 MG TABS tablet  01/21/14   Historical Provider, MD   Physical Exam: Filed Vitals:   01/26/14 1308 01/26/14 1649  BP: 158/57 133/67  Pulse: 73  82  Temp: 98.7 F (37.1 C)   TempSrc: Oral   Resp: 16 23  SpO2: 100% 100%    Wt Readings from Last 3 Encounters:  08/31/12 78.744 kg (173 lb 9.6 oz)  05/06/10 77.111 kg (170 lb)    General:  Appears uncomfortable due to pain Eyes: PERRL, normal lids, irises & conjunctiva ENT: grossly normal hearing, lips & tongue very dry. Anterior incision appears clean, swelling area posterior neck.  Neck: no LAD, masses or thyromegaly Cardiovascular: RRR, no m/r/g. No LE edema. Telemetry: SR, no arrhythmias  Respiratory: CTA bilaterally, no w/r/r. Normal respiratory effort. Abdomen: soft, ntnd Skin: no rash or induration seen on limited exam Musculoskeletal: grossly normal tone BUE/BLE, no significant edema on  upper extremity.  Psychiatric: grossly normal mood and affect, speech fluent and appropriate Neurologic: grossly non-focal. Motor strength 5/5.            Labs on Admission:  Basic Metabolic Panel:  Recent Labs Lab 01/26/14 1452  NA 143  K 4.0  CL 106  CO2 20  GLUCOSE 97  BUN 23  CREATININE 0.65  CALCIUM 9.3   Liver Function Tests: No results for input(s): AST, ALT, ALKPHOS, BILITOT, PROT, ALBUMIN in the last 168 hours. No results for input(s): LIPASE, AMYLASE in the last 168 hours. No results for input(s): AMMONIA in the last 168 hours. CBC:  Recent Labs Lab 01/26/14 1452  WBC 10.7*  NEUTROABS 7.0  HGB 14.7  HCT 43.6  MCV 84.7  PLT 361   Cardiac Enzymes: No results for input(s): CKTOTAL, CKMB, CKMBINDEX, TROPONINI in the last 168 hours.  BNP (last 3 results) No results for input(s): PROBNP in the last 8760 hours. CBG: No results for input(s): GLUCAP in the last 168 hours.  Radiological Exams on Admission: No results found.  EKG: ordered.   Assessment/Plan Active Problems:   Nausea & vomiting   Dehydration   Neck and shoulder pain  1-Neck Pain, Shoulder Pain: Post C spine surgery: Patient presents with severe neck, left shoulder arm pain that started post surgery.  Will check Shoulder X ray.  Will defer MRI c spine to neurosurgery/  IV dilaudid every 3 hours PRN.  Continue with valium PRN.    2-Nausea, vomiting; differential could be related to medications, but also this could be secondary to ileus.  Continue with IV fluids.  IV Zofran PRN.  Will check KUB, lipase, LFT, cycle cardiac enzymes.  Clear diet.  Follow urine culture.   3-Mild leukocytosis:  Follow trend.   Discussed with Dr Christella Noa, he will admit patient to [facility] and take over patient care. He will call us as needed. [facility] sign off.    Code Status: Presume Full Code.  DVT Prophylaxis:SCD.  Family Communication: Care discussed with patient and friend who was at bedside.    Disposition Plan: expect 2 to 3 days inpatient.   Time spent: 75 minutes.   Niel Hummer A [facility] Pager [phone]

## 2014-01-26 NOTE — ED Notes (Signed)
Patient talking with Dr. Christella Noa

## 2014-01-26 NOTE — ED Notes (Signed)
She states she has had nausea with occasional, but not excessive vomiting since having an operation on  her c-spine 11-2 of this month.

## 2014-01-26 NOTE — ED Notes (Signed)
I have just called report to 4 N to nurse Aiesha.  We are notifying [facility] at this time.

## 2014-01-26 NOTE — H&P (Signed)
[id number] Lorraine [name] is a 63 y.o. female Whom underwent an ACDF at [facility]5,5/6  By Dr. Hal Neer on November 2. She went home that day without difficulty, with a normal neurologic examination. Post op she stated she started to have fairly severe pain in the left upper extremity, which was the painful limb preop, on November 7th. She contacted the on call physician, who instructed her to take an anti inflammatory in addition to her pain medication. She had stopped on her own accord the pain medication on November 4th. She was not able to tolerate the percocet, so she took hydrocodone which she had on the 7th. Lorraine [name] was prescribed a medrol dosepak on the 8th, and stated she felt well for one day. Then she describes an intense tingling in the left upper extremity along with pain. Lorraine [name] did see Dr. Hal Neer this past Friday and was told she was doing ok, but she did go home. This morning she called me stating she was dehydrated, and that she needed an IV. She came in to [facility] long ED for evaluation. Nothing of note was appreciated by the ED physician, but I will admit her for iv hydration, and pain control.  Allergies  Allergen Reactions  . Codeine Nausea Only   Prior to Admission medications   Medication Sig Start Date End Date Taking? Authorizing Provider  ALPRAZolam Duanne Moron) 0.5 MG tablet Take 0.5 mg by mouth as needed for sleep.   Yes Historical Provider, MD  diazepam (VALIUM) 5 MG tablet Take 5 mg by mouth 3 (three) times daily as needed for anxiety.   Yes Historical Provider, MD  famotidine (PEPCID) 20 MG tablet Take 20 mg by mouth as needed for heartburn.   Yes Historical Provider, MD  Hydrocodone-Acetaminophen (VICODIN) 5-300 MG TABS Take 1 tablet by mouth every 4 (four) hours as needed (pain).   Yes Historical Provider, MD  HYDROmorphone (DILAUDID) 4 MG tablet Take 4 mg by mouth every 4 (four) hours as needed for severe pain.   Yes Historical Provider, MD  meloxicam (MOBIC) 15 MG tablet Take 15 mg  by mouth daily.   Yes Historical Provider, MD  NON FORMULARY Take 1 each by mouth daily. Pure Magnesium Plus (takes a few times a week)   Yes Historical Provider, MD  traMADol (ULTRAM) 50 MG tablet Take 50 mg by mouth every 6 (six) hours as needed for pain.   Yes Historical Provider, MD  triazolam (HALCION) 0.25 MG tablet Take 0.25 mg by mouth at bedtime.   Yes Historical Provider, MD  Darcey Nora SUSP  12/31/13   Historical Provider, MD  Oxycodone HCl 10 MG TABS Take 10 mg by mouth every 4 (four) hours as needed (pain).  01/02/14   Historical Provider, MD  predniSONE (STERAPRED UNI-PAK) 5 MG TABS tablet  01/21/14   Historical Provider, MD   Past Surgical History  Procedure Laterality Date  . Tonsillectomy  child  . Abdominal hysterectomy  1974  . Morton neuroma  2010, 2011    R & L foot  . Plantar fascitis  2012    L foot  . R elbow surgery  08/2010  . Cervical spine surgery     Past Medical History  Diagnosis Date  . Insomnia   . Arthritis   . Hyperlipidemia    History   Social History  . Marital Status: Divorced    Spouse Name: N/A    Number of Children: N/A  . Years of Education: N/A   Occupational History  .  Not on file.   Social History Main Topics  . Smoking status: Never Smoker   . Smokeless tobacco: Never Used  . Alcohol Use: No  . Drug Use: No  . Sexual Activity: Not on file   Other Topics Concern  . Not on file   Social History Narrative   No family history on file. Physical Exam  Constitutional: She is oriented to person, place, and time. She appears well-developed and well-nourished. She appears distressed.  HENT:  Head: Normocephalic and atraumatic.  Right Ear: External ear normal.  Left Ear: External ear normal.  Nose: Nose normal.  Mouth/Throat: Oropharynx is clear and moist.  Eyes: Conjunctivae and EOM are normal. Pupils are equal, round, and reactive to light. Right eye exhibits no discharge. Left eye exhibits no discharge.  Neck: Normal range of  motion. Neck supple.  Cardiovascular: Normal rate, regular rhythm, normal heart sounds and intact distal pulses.   Pulmonary/Chest: Effort normal and breath sounds normal.  Abdominal: Soft. Bowel sounds are normal.  Musculoskeletal: Normal range of motion.  Neurological: She is alert and oriented to person, place, and time. She has normal strength and normal reflexes. She displays normal reflexes. No cranial nerve deficit or sensory deficit. She exhibits normal muscle tone. Coordination normal. She displays no Babinski's sign on the right side. She displays no Babinski's sign on the left side.  Reflex Scores:      Tricep reflexes are 2+ on the right side and 2+ on the left side.      Bicep reflexes are 2+ on the right side and 2+ on the left side.      Brachioradialis reflexes are 2+ on the right side and 2+ on the left side.      Patellar reflexes are 2+ on the right side and 2+ on the left side.      Achilles reflexes are 2+ on the right side and 2+ on the left side. Normal proprioception Normal fundoscopic evaluation Coordination in the upper extremities is normal  Skin: Skin is warm and dry.  Psychiatric: She has a normal mood and affect. Her behavior is normal. Judgment and thought content normal.   Assessment/Plan Admit for pain control, and IV hydration. Vital signs are within normal limits.  Will obtain plain xray upon arrival to [facility] hospital.

## 2014-01-26 NOTE — ED Notes (Signed)
Bed: YS16 Expected date: 01/26/14 Expected time: 12:46 PM Means of arrival: Ambulance Comments: N/V arm pain

## 2014-01-27 ENCOUNTER — Inpatient Hospital Stay (HOSPITAL_COMMUNITY): Payer: BC Managed Care – PPO

## 2014-01-27 ENCOUNTER — Encounter (HOSPITAL_COMMUNITY): Payer: Self-pay | Admitting: *Deleted

## 2014-01-27 LAB — URINE CULTURE: Colony Count: 50000

## 2014-01-27 MED ORDER — PROCHLORPERAZINE 25 MG RE SUPP
25.0000 mg | Freq: Two times a day (BID) | RECTAL | Status: DC | PRN
  Filled 2014-01-27: qty 1

## 2014-01-27 MED ORDER — HYDROMORPHONE HCL 2 MG PO TABS
2.0000 mg | ORAL_TABLET | ORAL | Status: DC | PRN
  Administered 2014-01-27 – 2014-01-29 (×9): 2 mg via ORAL
  Filled 2014-01-27 (×11): qty 1

## 2014-01-27 NOTE — Plan of Care (Signed)
Problem: Phase I Progression Outcomes Goal: OOB as tolerated unless otherwise ordered Outcome: Completed/Met Date Met:  01/27/14

## 2014-01-27 NOTE — Plan of Care (Signed)
Problem: Consults Goal: General Medical Patient Education See Patient Education Module for specific education.  Outcome: Completed/Met Date Met:  01/27/14

## 2014-01-27 NOTE — Plan of Care (Signed)
Problem: Phase III Progression Outcomes Goal: Voiding independently Outcome: Completed/Met Date Met:  01/27/14

## 2014-01-27 NOTE — Plan of Care (Signed)
Problem: Phase II Progression Outcomes Goal: Progress activity as tolerated unless otherwise ordered Outcome: Completed/Met Date Met:  01/27/14     

## 2014-01-27 NOTE — Plan of Care (Signed)
Problem: Phase I Progression Outcomes Goal: Voiding-avoid urinary catheter unless indicated Outcome: Completed/Met Date Met:  01/27/14

## 2014-01-27 NOTE — Progress Notes (Signed)
Patient ID: Lorraine [name], female   DOB: [birth year], 63 y.o.   MRN: [id number] BP 124/59 mmHg  Pulse 62  Temp(Src) 98.3 F (36.8 C) (Oral)  Resp 18  Ht 5\' 5"  (1.651 m)  Wt 77.111 kg (170 lb)  BMI 28.29 kg/m2  SpO2 100% Alert and oriented x 4, speech is clear and fluent Complaining of nausea, no emesis since admission Moving all extremities States she still feels ill. Poor appetite encouraged to eat

## 2014-01-28 ENCOUNTER — Inpatient Hospital Stay (HOSPITAL_COMMUNITY): Payer: BC Managed Care – PPO

## 2014-01-28 MED ORDER — GABAPENTIN 600 MG PO TABS
300.0000 mg | ORAL_TABLET | Freq: Two times a day (BID) | ORAL | Status: DC
  Administered 2014-01-28 – 2014-01-29 (×4): 300 mg via ORAL
  Filled 2014-01-28 (×5): qty 1

## 2014-01-28 MED ORDER — PANTOPRAZOLE SODIUM 40 MG PO TBEC
40.0000 mg | DELAYED_RELEASE_TABLET | Freq: Two times a day (BID) | ORAL | Status: DC
  Administered 2014-01-28 – 2014-01-29 (×4): 40 mg via ORAL
  Filled 2014-01-28 (×5): qty 1

## 2014-01-28 NOTE — Progress Notes (Signed)
CARE MANAGEMENT NOTE 01/28/2014  Patient:  Lorraine [name], Lorraine [name]   Account Number:  [id number]  Date Initiated:  01/28/2014  Documentation initiated by:  Olga Coaster  Subjective/Objective Assessment:   ADMITTED WITH CERVICALGIA, NAUSEA/ VOMITING     Action/Plan:   CM FOLLOWING FOR DCP   Anticipated DC Date:  02/01/2014   Anticipated DC Plan:  POSSIBLY HOME/SELF CARE     DC Planning Services  CM consult         Status of service:  In process, will continue to follow Medicare Important Message given?   (If response is "NO", the following Medicare IM given date fields will be blank)  Per UR Regulation:  Reviewed for med. necessity/level of care/duration of stay  Comments:  11/16/2015Mindi Slicker RN,BSN,MHA [phone]

## 2014-01-28 NOTE — Progress Notes (Signed)
Patient ID: Lorraine [name], female   DOB: 12/31/1950, 63 y.o.   MRN: [id number] Afeb, vss Says she feels better in general but has bad Left arm pain. No new neuro issues. Will check MRI scan of the neck and make plan based on our findings.

## 2014-01-28 NOTE — Progress Notes (Signed)
Pt last bowel movement on 11/2.  Pt is getting scheduled senna but refusing anything  Guadalupe Dawn for it, states " It will happen when it happens".  Pt passing gas, bowel sounds active, abdomen soft non-tender.  This nurse convinced her to take dulcolax PO this shift.  Will continue to monitor. Cori Razor, RN

## 2014-01-29 NOTE — Plan of Care (Signed)
Problem: Phase I Progression Outcomes Goal: Hemodynamically stable Outcome: Completed/Met Date Met:  01/29/14

## 2014-01-29 NOTE — Progress Notes (Signed)
Patient complaining of left arm/sholder pain applied ice and po pain medication she rested well last night and says she has reduced pain this morning will continue to monitor.

## 2014-01-29 NOTE — Progress Notes (Signed)
Patient ID: Lorraine [name], female   DOB: [birth year], 63 y.o.   MRN: [id number] Afeb, vss No new neuro issues Says her pain is a bit better. I have reviewed the MRI and it looks excellent with no post issues to explain her left arm pain. The plan is to continue the present treatment, and see how she feels tomorrow with tentative plans to have her go home in the am.

## 2014-01-30 MED ORDER — HYDROMORPHONE HCL 2 MG PO TABS: 2.0000 mg | ORAL_TABLET | ORAL | Status: DC | PRN

## 2014-01-30 MED ORDER — DIAZEPAM 5 MG PO TABS: 5.0000 mg | ORAL_TABLET | Freq: Four times a day (QID) | ORAL | Status: DC | PRN

## 2014-01-30 NOTE — Progress Notes (Signed)
Pt is being discharged home. Discharge instructions were given to patient and family 

## 2014-01-30 NOTE — Discharge Summary (Signed)
  Physician Discharge Summary  Patient ID: Lorraine [name] MRN: [id number] DOB/AGE: March 31, 1950 63 y.o.  Admit date: 01/26/2014 Discharge date: 01/30/2014  Admission Diagnoses:  Discharge Diagnoses:  Active Problems:   Nausea & vomiting   Dehydration   Neck and shoulder pain   Cervicalgia   Discharged Condition: fair  Hospital Course: Surgery 2 weeks ago with 2 level acdf. Initially did well but then developed left shoulder region pain. Admitted for pain control. MRI neck done which looked excellent. Was able to increase activity. Initislly had nausea and some vomiting but this improved. Once pain adequaltely controlled, felt could be released with out patient follow up.  Consults: None  Significant Diagnostic Studies: MRI  Treatments: IV hydration  Discharge Exam: Blood pressure 112/48, pulse 52, temperature 98.1 F (36.7 C), temperature source Oral, resp. rate 18, height 5\' 5"  (1.651 m), weight 77.111 kg (170 lb), SpO2 99 %. Incision/Wound:clean and dry  Disposition: 01-Home or Self Care     Medication List    ASK your doctor about these medications        ALPRAZolam 0.5 MG tablet  Commonly known as:  XANAX  Take 0.5 mg by mouth as needed for sleep.     diazepam 5 MG tablet  Commonly known as:  VALIUM  Take 5 mg by mouth 3 (three) times daily as needed for anxiety.     FLUVIRIN Susp  Generic drug:  Influenza Vac Typ A&B Surf Ant     HYDROmorphone 4 MG tablet  Commonly known as:  DILAUDID  Take 4 mg by mouth every 4 (four) hours as needed for severe pain.     meloxicam 15 MG tablet  Commonly known as:  MOBIC  Take 15 mg by mouth daily.     NON FORMULARY  Take 1 each by mouth daily. Pure Magnesium Plus (takes a few times a week)     Oxycodone HCl 10 MG Tabs  Take 10 mg by mouth every 4 (four) hours as needed (pain).     PEPCID 20 MG tablet  Generic drug:  famotidine  Take 20 mg by mouth as needed for heartburn.     predniSONE 5 MG Tabs tablet   Commonly known as:  STERAPRED UNI-PAK     traMADol 50 MG tablet  Commonly known as:  ULTRAM  Take 50 mg by mouth every 6 (six) hours as needed for pain.     triazolam 0.25 MG tablet  Commonly known as:  HALCION  Take 0.25 mg by mouth at bedtime.     VICODIN 5-300 MG Tabs  Generic drug:  Hydrocodone-Acetaminophen  Take 1 tablet by mouth every 4 (four) hours as needed (pain).         At home rest most of the time. Get up 9 or 10 times each day and take a 15 or 20 minute walk. No riding in the car and to your first postoperative appointment. If you have neck surgery you may shower from the chest down starting on the third postoperative day. If you had back surgery he may start showering on the third postoperative day with saran wrap wrapped around your incisional area 3 times. After the shower remove the saran wrap. Take pain medicine as needed and other medications as instructed. Call my office for an appointment.  SignedFaythe Ghee, MD 01/30/2014, 8:59 AM

## 2014-03-18 LAB — CBC AND DIFFERENTIAL
HEMATOCRIT: 43 % (ref 36–46)
Hemoglobin: 14.8 g/dL (ref 12.0–16.0)
PLATELETS: 321 10*3/uL (ref 150–399)
WBC: 6.9 10^3/mL

## 2014-03-18 LAB — BASIC METABOLIC PANEL
POTASSIUM: 4.2 mmol/L (ref 3.4–5.3)
SODIUM: 141 mmol/L (ref 137–147)

## 2015-03-06 ENCOUNTER — Encounter: Payer: Self-pay | Admitting: Podiatry

## 2015-03-06 ENCOUNTER — Ambulatory Visit (INDEPENDENT_AMBULATORY_CARE_PROVIDER_SITE_OTHER): Payer: BC Managed Care – PPO | Admitting: Podiatry

## 2015-03-06 VITALS — BP 134/81 | HR 59 | Resp 16 | Ht 65.0 in | Wt 155.0 lb

## 2015-03-06 DIAGNOSIS — M722 Plantar fascial fibromatosis: Secondary | ICD-10-CM

## 2015-03-06 MED ORDER — METHYLPREDNISOLONE 4 MG PO TBPK: ORAL_TABLET | ORAL | Status: DC

## 2015-03-06 NOTE — Patient Instructions (Signed)

## 2015-03-06 NOTE — Progress Notes (Signed)
   Subjective:    Patient ID: Lorraine [name], female    DOB: 1950/11/25, 64 y.o.   MRN: [id number]  HPI: She presents today with a 2 month duration of a painful right heel that seems to be screaming at her with every step she takes. She had a friend who had shockwave performed and she would like to consider this at some point.    Review of Systems  All other systems reviewed and are negative.      Objective:   Physical Exam: She presents today as a 64 year old female vital signs stable alert and oriented 3 pulses are strongly palpable. Neurologic sensorium is intact versus lasting monofilament. Deep tendon reflexes are intact. Muscle strength +5 over 5 dorsiflexion plantar flexors and inverters everters all intrinsic musculature is intact. Orthopedic evaluation Mr. Insall joints distal to the ankle have a full range of motion without crepitation. His pain on palpation medial calcaneal tubercle of the right heel. She has pain on palpation medial calcaneal tubercle of the right heel. Consistent with plantar fasciitis. Radiographs confirm plantar distally oriented calcaneal heel spur. Soft tissue increase in density of plantar fascial calcaneal insertion site and a plantar fasciitis. Cutaneous evaluation demonstrates supple well-hydrated cutis there is no erythema edema cellulitis drainage or odor.      Assessment & Plan:  Assessment: Plantar fasciitis 2 months right foot.  Plan: Discussed etiology pathology conservative versus surgical therapies. Placed her in a plantar fascial brace. She has a night splint at home. Started on a Medrol Dosepak. Discussed appropriate shoe gear stretching exercises ice therapy sugar modifications. Injection right heel. We will discuss the possible need for shockwave therapy at her next visit.

## 2015-03-21 ENCOUNTER — Other Ambulatory Visit: Payer: Self-pay | Admitting: Specialist

## 2015-03-21 DIAGNOSIS — R5381 Other malaise: Secondary | ICD-10-CM

## 2015-04-01 ENCOUNTER — Ambulatory Visit (INDEPENDENT_AMBULATORY_CARE_PROVIDER_SITE_OTHER): Payer: BC Managed Care – PPO | Admitting: Podiatry

## 2015-04-01 ENCOUNTER — Encounter: Payer: Self-pay | Admitting: Podiatry

## 2015-04-01 VITALS — BP 125/69 | HR 53 | Resp 16

## 2015-04-01 DIAGNOSIS — M722 Plantar fascial fibromatosis: Secondary | ICD-10-CM

## 2015-04-01 NOTE — Progress Notes (Signed)
She presents today states that she is doing approximately 80% improvement of her right heel pain.  Objective: Pulses are palpable. Pain on palpation medial calcaneal tubercle of the right foot. No calf pain.  Assessment: Plantar fasciitis right foot.  Plan: Injected the right heel today follow-up with her in 1 month.

## 2015-04-14 ENCOUNTER — Other Ambulatory Visit: Payer: Self-pay | Admitting: Specialist

## 2015-04-14 DIAGNOSIS — Z780 Asymptomatic menopausal state: Secondary | ICD-10-CM

## 2015-05-07 ENCOUNTER — Ambulatory Visit
Admission: RE | Admit: 2015-05-07 | Discharge: 2015-05-07 | Disposition: A | Discharge status: Home / Self Care | Payer: BC Managed Care – PPO | Source: Ambulatory Visit | Attending: Specialist | Admitting: Specialist

## 2015-05-07 DIAGNOSIS — Z780 Asymptomatic menopausal state: Secondary | ICD-10-CM

## 2015-05-08 ENCOUNTER — Ambulatory Visit (INDEPENDENT_AMBULATORY_CARE_PROVIDER_SITE_OTHER): Payer: BC Managed Care – PPO | Admitting: Podiatry

## 2015-05-08 ENCOUNTER — Encounter: Payer: Self-pay | Admitting: Podiatry

## 2015-05-08 VITALS — BP 138/66 | HR 58 | Resp 12

## 2015-05-08 DIAGNOSIS — M722 Plantar fascial fibromatosis: Secondary | ICD-10-CM

## 2015-05-08 DIAGNOSIS — S91311A Laceration without foreign body, right foot, initial encounter: Secondary | ICD-10-CM | POA: Diagnosis not present

## 2015-05-08 MED ORDER — MUPIROCIN 2 % EX OINT: TOPICAL_OINTMENT | CUTANEOUS | Status: DC

## 2015-05-08 NOTE — Progress Notes (Signed)
She presents today for follow-up of her plantar fasciitis of her right foot. She states that she is doing very well until she overdid working outside in the yard just the other day. He states that she felt like there was something in her heel that was hurting her stabbing her. When she evaluated the heel she noticed a laceration on the heel but was not bleeding at the time. She had been wearing her plantar fascial brace at that time. She also has a small area of irritation medial aspect of the foot as well superior to the fat pad.  Objective: Vital signs are stable alert and oriented 3. Pulses are palpable. Minimal pain on palpation medial calcaneal tubercle of the right heel. Superficial laceration plantar aspect of the right foot which appears to be healing and contracting already. This does not appear to be deep nor do I see any foreign body. She does have some skin breakdown medial aspect of the foot it appears that there was probably some irritation associated with the plantar fascial brace or possibly even something in her shoemaker caused this. They're very well could've been some skin breakdown associated with steroid injections however the injection will side of the foot and inferior to the area in question.  Assessment: Plantar fasciitis resolving skin fissure plantar aspect right foot.  Plan: Started her on Bactroban ointment to be applied daily and I will follow-up with her in a week or 2.

## 2015-05-14 HISTORY — PX: COLONOSCOPY: SHX174

## 2015-05-14 LAB — HM COLONOSCOPY

## 2015-05-27 ENCOUNTER — Other Ambulatory Visit: Payer: Self-pay | Admitting: Gastroenterology

## 2015-10-14 ENCOUNTER — Ambulatory Visit (INDEPENDENT_AMBULATORY_CARE_PROVIDER_SITE_OTHER): Payer: BC Managed Care – PPO | Admitting: Podiatry

## 2015-10-14 ENCOUNTER — Ambulatory Visit (INDEPENDENT_AMBULATORY_CARE_PROVIDER_SITE_OTHER): Payer: BC Managed Care – PPO

## 2015-10-14 ENCOUNTER — Encounter: Payer: Self-pay | Admitting: Podiatry

## 2015-10-14 DIAGNOSIS — S92301A Fracture of unspecified metatarsal bone(s), right foot, initial encounter for closed fracture: Secondary | ICD-10-CM | POA: Diagnosis not present

## 2015-10-14 DIAGNOSIS — M7671 Peroneal tendinitis, right leg: Secondary | ICD-10-CM

## 2015-10-15 NOTE — Progress Notes (Signed)
She presents today for follow-up of her plantar fasciitis and pain to the lateral aspect of the foot now she states the plantar fasciitis has resolved at the lateral aspect of the foot is still painful. Denies any trauma.  Objective: Vital signs are stable she is alert and oriented 3. Pulses are palpable. She is pain on palpation of fifth metatarsal base of the right foot and along the peroneal tendon of the brevis. Redress demonstrates what appears to be a possible avulsion fracture fifth metatarsal base right with soft tissue edema around the peroneal tendons.  Assessment: Peroneal tendinitis fifth metatarsal base avulsion fracture.  Plan: I placed her in a short Cam Walker will follow up with her for another set of x-rays in 4 weeks.

## 2015-11-11 ENCOUNTER — Ambulatory Visit (INDEPENDENT_AMBULATORY_CARE_PROVIDER_SITE_OTHER): Payer: BC Managed Care – PPO | Admitting: Podiatry

## 2015-11-11 ENCOUNTER — Encounter: Payer: Self-pay | Admitting: Podiatry

## 2015-11-11 ENCOUNTER — Ambulatory Visit (INDEPENDENT_AMBULATORY_CARE_PROVIDER_SITE_OTHER): Payer: BC Managed Care – PPO

## 2015-11-11 DIAGNOSIS — M7671 Peroneal tendinitis, right leg: Secondary | ICD-10-CM

## 2015-11-11 DIAGNOSIS — S92301A Fracture of unspecified metatarsal bone(s), right foot, initial encounter for closed fracture: Secondary | ICD-10-CM

## 2015-11-12 NOTE — Progress Notes (Signed)
She presents today for follow-up of her fifth metatarsal base fracture/avulsion fracture and insertional peroneal tendinitis. She states that it's getting better but it's not well.  Objective: Vital signs are stable she is alert and oriented 3 still has edema over the fifth metatarsal base and peroneal brevis tendon. She has pain on palpation of the insertion site of the fifth metatarsal and peroneal tendon. Radiographs taken today demonstrate no change in fifth metatarsal base.  Assessment: I'm concerned she may have a split tear of the peroneal tendon.  Assessment: I'm requesting an MRI she is to continue to wear her boot until the MRI is complete.

## 2015-11-19 ENCOUNTER — Telehealth: Payer: Self-pay | Admitting: *Deleted

## 2015-11-19 NOTE — Telephone Encounter (Signed)
"  I spoke to Imaging people about my MRI.  It is scheduled for tomorrow.  They said it needs authorization.  I'm calling to see what I need to do.  I can't afford to pay for this.  What needs to be done?"  I will take care of the pre-certification.  You should be fine for tomorrow.  "How will I know that it has been authorized?"  [facility] will not do the test without approval.  "So, I can call them before I go tomorrow to see if it has been approved?"  Yes, that is correct.

## 2015-11-20 ENCOUNTER — Ambulatory Visit
Admission: RE | Admit: 2015-11-20 | Discharge: 2015-11-20 | Disposition: A | Discharge status: Home / Self Care | Payer: BC Managed Care – PPO | Source: Ambulatory Visit | Attending: Podiatry | Admitting: Podiatry

## 2015-11-20 DIAGNOSIS — M7671 Peroneal tendinitis, right leg: Secondary | ICD-10-CM

## 2015-12-04 ENCOUNTER — Telehealth: Payer: Self-pay | Admitting: *Deleted

## 2015-12-04 ENCOUNTER — Ambulatory Visit: Payer: BC Managed Care – PPO | Admitting: Podiatry

## 2015-12-04 NOTE — Telephone Encounter (Addendum)
Dr. Milinda Pointer ordered MRI overread.  I informed pt of the delay, pt states she would like to get a tentative appt to discuss the results and she is currently feeling better and doing passive stretches, because she got her results. Mailed copy of MRI disc to [facility]. 12/10/2015-Pt asked if the overread had arrived and if she could get a copy. Left message inform pt overread was available for Dr. Milinda Pointer and I would attach copy for her to pick up at [place].

## 2015-12-18 ENCOUNTER — Ambulatory Visit: Payer: BC Managed Care – PPO | Admitting: Podiatry

## 2015-12-19 ENCOUNTER — Encounter: Payer: Self-pay | Admitting: Podiatry

## 2015-12-23 ENCOUNTER — Ambulatory Visit (INDEPENDENT_AMBULATORY_CARE_PROVIDER_SITE_OTHER): Payer: BC Managed Care – PPO | Admitting: Podiatry

## 2015-12-23 ENCOUNTER — Encounter: Payer: Self-pay | Admitting: Podiatry

## 2015-12-23 DIAGNOSIS — M7671 Peroneal tendinitis, right leg: Secondary | ICD-10-CM

## 2015-12-23 DIAGNOSIS — M722 Plantar fascial fibromatosis: Secondary | ICD-10-CM

## 2015-12-23 MED ORDER — GABAPENTIN 300 MG PO CAPS: 300.0000 mg | ORAL_CAPSULE | Freq: Every day | ORAL | 3 refills | Status: DC

## 2015-12-24 NOTE — Progress Notes (Signed)
She presents today for review of her MRI of her right foot which was positive for interstitial tearing of the plantar fascia. She states it is quite tender today and tingling.  Objective: Vital signs are stable alert and oriented 3 we reviewed the radiographs and MRIs as well as report with her today which did demonstrate interstitial tearing of the right plantar fascia.  Assessment: Plantar fasciitis right.  Plan: I injected the right heel today with Kenalog and local anesthetic. We discussed need for surgical intervention I'll follow-up with her in early November for surgical consult for endoscopic fasciotomy.

## 2016-01-20 ENCOUNTER — Ambulatory Visit: Payer: BC Managed Care – PPO | Admitting: Podiatry

## 2016-01-21 DIAGNOSIS — N764 Abscess of vulva: Secondary | ICD-10-CM | POA: Diagnosis not present

## 2016-01-27 DIAGNOSIS — M25512 Pain in left shoulder: Secondary | ICD-10-CM | POA: Diagnosis not present

## 2016-01-27 DIAGNOSIS — M542 Cervicalgia: Secondary | ICD-10-CM | POA: Diagnosis not present

## 2016-02-03 ENCOUNTER — Ambulatory Visit: Payer: BC Managed Care – PPO | Admitting: Podiatry

## 2016-02-16 DIAGNOSIS — M25512 Pain in left shoulder: Secondary | ICD-10-CM | POA: Diagnosis not present

## 2016-02-17 ENCOUNTER — Encounter: Payer: Self-pay | Admitting: Podiatry

## 2016-02-17 ENCOUNTER — Ambulatory Visit (INDEPENDENT_AMBULATORY_CARE_PROVIDER_SITE_OTHER): Payer: Medicare Other | Admitting: Podiatry

## 2016-02-17 VITALS — BP 141/53 | HR 60 | Resp 16

## 2016-02-17 DIAGNOSIS — M722 Plantar fascial fibromatosis: Secondary | ICD-10-CM | POA: Diagnosis not present

## 2016-02-17 DIAGNOSIS — M25512 Pain in left shoulder: Secondary | ICD-10-CM | POA: Diagnosis not present

## 2016-02-17 NOTE — Patient Instructions (Signed)

## 2016-02-18 NOTE — Progress Notes (Signed)
She presents today with chief complaint of pain to the right foot. She states that this is been a chronic pain and ongoing problem and should like to consider surgical intervention. No changes in her past history medications or allergies.  Objective: I have reviewed her past medical history medications allergy surgery social history and review of systems area pulses are strongly palpable. Neurologic sensorium is intact. She has pain on palpation medial calcaneal tubercle of the right foot.  Assessment: Pain in limb secondary to plantar fasciitis chronic in nature right foot.  Plan: Discussed etiology pathology conservative versus surgical therapies at this point time we consented her for an endoscopic plantar fasciotomy right foot. We discussed possible postop complications which may include but are not limited to postop pain bleeding swelling infection need for further surgery overcorrection and under correction loss of digit loss of limb loss of life.  We dispensed information regarding the surgical center today as well as a Cam Walker for her postop recovery.Marland Kitchen

## 2016-03-02 ENCOUNTER — Telehealth: Payer: Self-pay | Admitting: *Deleted

## 2016-03-02 NOTE — Telephone Encounter (Signed)
"  I am having foot surgery on January 19 with Dr. Milinda Pointer.  We were supposed to schedule my post-op appointments for January 25 and February 1.  We felled to do that, I need for you to call me so I can get that scheduled."

## 2016-03-12 NOTE — Telephone Encounter (Signed)
I am returning your call.  We have your first post-operative appointment scheduled for January 25 at 8:45 am and second one is scheduled for February 1 at 9:15 am.  "What time will I need to be there for my surgery?  Does the surgical center ever close due to inclement weather?"  Someone from the surgical center will call you with the arrival time a day or two prior to surgery date.  They will notify you if there are any changes in their schedule due to inclement weather.

## 2016-04-01 ENCOUNTER — Other Ambulatory Visit: Payer: Self-pay | Admitting: Podiatry

## 2016-04-01 MED ORDER — ONDANSETRON HCL 4 MG PO TABS: 4.0000 mg | ORAL_TABLET | Freq: Three times a day (TID) | ORAL | 0 refills | Status: DC | PRN

## 2016-04-01 MED ORDER — HYDROMORPHONE HCL 4 MG PO TABS: 4.0000 mg | ORAL_TABLET | ORAL | 0 refills | Status: DC | PRN

## 2016-04-01 MED ORDER — CEPHALEXIN 500 MG PO CAPS: 500.0000 mg | ORAL_CAPSULE | Freq: Three times a day (TID) | ORAL | 0 refills | Status: DC

## 2016-04-02 ENCOUNTER — Encounter: Payer: Self-pay | Admitting: Podiatry

## 2016-04-02 DIAGNOSIS — M25571 Pain in right ankle and joints of right foot: Secondary | ICD-10-CM | POA: Diagnosis not present

## 2016-04-02 DIAGNOSIS — M722 Plantar fascial fibromatosis: Secondary | ICD-10-CM | POA: Diagnosis not present

## 2016-04-02 DIAGNOSIS — E7800 Pure hypercholesterolemia, unspecified: Secondary | ICD-10-CM | POA: Diagnosis not present

## 2016-04-05 ENCOUNTER — Telehealth: Payer: Self-pay

## 2016-04-05 NOTE — Telephone Encounter (Signed)
Spoke with pt regarding post op status, she states she is doing ok, although the block has worn off. She states that she was taking a muscle relaxer instead of the Rx pain medication and it seemed to work well. She has had no nausea, chills or fever. Advised on boot usage, ice and elevation. She is to call with any acute s/s changes

## 2016-04-06 ENCOUNTER — Other Ambulatory Visit: Payer: Self-pay

## 2016-04-06 MED ORDER — TIZANIDINE HCL 4 MG PO TABS: 4.0000 mg | ORAL_TABLET | Freq: Four times a day (QID) | ORAL | 0 refills | Status: DC | PRN

## 2016-04-06 NOTE — Progress Notes (Signed)
Spoke with pt in regards to taking a Zanaflex for post op pain instead of Rx pain medication. Per Dr Milinda Pointer, an Rx was written for this medication Zanaflex 4mg  #10 take Q6hrs prn muscle spasms. Advised her to continue with elevation and rest.

## 2016-04-08 ENCOUNTER — Encounter: Payer: Self-pay | Admitting: Podiatry

## 2016-04-08 ENCOUNTER — Ambulatory Visit (INDEPENDENT_AMBULATORY_CARE_PROVIDER_SITE_OTHER): Payer: Medicare Other | Admitting: Podiatry

## 2016-04-08 DIAGNOSIS — M722 Plantar fascial fibromatosis: Secondary | ICD-10-CM

## 2016-04-08 NOTE — Progress Notes (Signed)
She presents today 1 week status post endoscopic plantar fasciotomy of the right foot states it is still quite sore. She denies chest pain Pain shortness of breath.  Objective: Vital signs are stable she is alert and oriented 3. Has tenderness on palpation of the plantar right heel does states that it has not. Be the same type of pain. Sutures are intact margins are well coapted. Right foot does not demonstrate any signs of infection.  Assessment: Status post 1 week endoscopic plantar fasciotomy right foot.  Plan: Place her in a compression anklet and I will allow her to start washing his foot. She sustained a cam walker granulation as well as sleeping. I will follow-up with her in 1 week for suture removal she will continue the use of the cam walker at nighttime but I will allow her back into her tennis shoes with orthotics.

## 2016-04-12 NOTE — Progress Notes (Signed)
DOS 01.19.2018 Endoscopic plantar fasciotomy right.

## 2016-04-15 ENCOUNTER — Ambulatory Visit (INDEPENDENT_AMBULATORY_CARE_PROVIDER_SITE_OTHER): Payer: Self-pay | Admitting: Podiatry

## 2016-04-15 DIAGNOSIS — Z9889 Other specified postprocedural states: Secondary | ICD-10-CM

## 2016-04-15 DIAGNOSIS — M722 Plantar fascial fibromatosis: Secondary | ICD-10-CM

## 2016-04-15 MED ORDER — TIZANIDINE HCL 4 MG PO TABS: 4.0000 mg | ORAL_TABLET | Freq: Four times a day (QID) | ORAL | 0 refills | Status: DC | PRN

## 2016-04-15 NOTE — Progress Notes (Signed)
Subjective:     Patient ID: Lorraine [name], female   DOB: Sep 22, 1950, 66 y.o.   MRN: [id number]  HPI patient presents the heel is doing very well with minimal discomfort   Review of Systems     Objective:   Physical Exam Neurovascular status intact negative Homans sign noted wound edges well coapted stitches in place with minimal plantar discomfort of the heel    Assessment:     Doing well post endoscopic surgery right    Plan:     Stitches were removed band aids applied and gave instructions on gradual increase in activity and utilization of compression stocking which was dispensed Ace wrap and elevation immobilization as needed. Reappoint in approximately 4 weeks

## 2016-04-28 ENCOUNTER — Encounter: Payer: Self-pay | Admitting: General Practice

## 2016-05-06 ENCOUNTER — Ambulatory Visit (INDEPENDENT_AMBULATORY_CARE_PROVIDER_SITE_OTHER): Payer: Medicare Other | Admitting: Podiatry

## 2016-05-06 ENCOUNTER — Encounter: Payer: Self-pay | Admitting: Podiatry

## 2016-05-06 VITALS — BP 117/66 | HR 61 | Resp 18

## 2016-05-06 DIAGNOSIS — M722 Plantar fascial fibromatosis: Secondary | ICD-10-CM | POA: Diagnosis not present

## 2016-05-06 MED ORDER — TIZANIDINE HCL 4 MG PO TABS: 4.0000 mg | ORAL_TABLET | Freq: Four times a day (QID) | ORAL | 0 refills | Status: DC | PRN

## 2016-05-06 MED ORDER — GABAPENTIN 300 MG PO CAPS: ORAL_CAPSULE | ORAL | 3 refills | Status: DC

## 2016-05-06 NOTE — Progress Notes (Signed)
She presents today one month status post endoscopic plantar fasciotomy states that she is doing well but she has some burning to the forefoot and medial longitudinal arch she states that she has not in the arch that is exquisitely painful and she is having pain along the outside of her foot and up the back of her leg.  Objective: Vital signs are stable she's alert and oriented 3. Pulses are palpable. Neurologic sensorium is intact she has no pain on direct palpation at the surgical site. She does have a nodular mass nonpulsatile in nature to the medial band plantar fascia in the medial longitudinal arch. I feel this is probably a small plantar fibroma.  Plan: Well-healing endoscopic plantar fasciotomy with postsurgical pain and a plantar fibroma medial longitudinal arch right.  Plan: I injected Kenalog into the site of maximal tenderness today into the fibroma. I will follow up with her in a few weeks. Pulses suggested that she take her gabapentin twice daily rather than just a when necessary basis which she is doing now. 300 mg gabapentin twice daily. Rewrote her a prescription for her.

## 2016-05-24 DIAGNOSIS — Z1231 Encounter for screening mammogram for malignant neoplasm of breast: Secondary | ICD-10-CM | POA: Diagnosis not present

## 2016-05-24 LAB — HM MAMMOGRAPHY

## 2016-05-25 ENCOUNTER — Other Ambulatory Visit: Payer: Self-pay | Admitting: General Practice

## 2016-05-26 ENCOUNTER — Encounter: Payer: Self-pay | Admitting: General Practice

## 2016-05-26 ENCOUNTER — Ambulatory Visit (INDEPENDENT_AMBULATORY_CARE_PROVIDER_SITE_OTHER): Payer: Medicare Other | Admitting: Family Medicine

## 2016-05-26 ENCOUNTER — Encounter: Payer: Self-pay | Admitting: Family Medicine

## 2016-05-26 DIAGNOSIS — E785 Hyperlipidemia, unspecified: Secondary | ICD-10-CM | POA: Diagnosis not present

## 2016-05-26 DIAGNOSIS — R768 Other specified abnormal immunological findings in serum: Secondary | ICD-10-CM | POA: Diagnosis not present

## 2016-05-26 DIAGNOSIS — G629 Polyneuropathy, unspecified: Secondary | ICD-10-CM

## 2016-05-26 DIAGNOSIS — R102 Pelvic and perineal pain: Secondary | ICD-10-CM | POA: Diagnosis not present

## 2016-05-26 DIAGNOSIS — M1990 Unspecified osteoarthritis, unspecified site: Secondary | ICD-10-CM | POA: Insufficient documentation

## 2016-05-26 DIAGNOSIS — G4700 Insomnia, unspecified: Secondary | ICD-10-CM | POA: Diagnosis not present

## 2016-05-26 DIAGNOSIS — R7689 Other specified abnormal immunological findings in serum: Secondary | ICD-10-CM | POA: Insufficient documentation

## 2016-05-26 LAB — CBC WITH DIFFERENTIAL/PLATELET
Basophils Absolute: 0.1 10*3/uL (ref 0.0–0.1)
Basophils Relative: 1.3 % (ref 0.0–3.0)
EOS PCT: 4.6 % (ref 0.0–5.0)
Eosinophils Absolute: 0.3 10*3/uL (ref 0.0–0.7)
HCT: 43.8 % (ref 36.0–46.0)
HEMOGLOBIN: 14.4 g/dL (ref 12.0–15.0)
LYMPHS ABS: 1.9 10*3/uL (ref 0.7–4.0)
Lymphocytes Relative: 28.1 % (ref 12.0–46.0)
MCHC: 32.9 g/dL (ref 30.0–36.0)
MCV: 87.3 fl (ref 78.0–100.0)
MONO ABS: 0.4 10*3/uL (ref 0.1–1.0)
Monocytes Relative: 5.7 % (ref 3.0–12.0)
NEUTROS ABS: 4.1 10*3/uL (ref 1.4–7.7)
Neutrophils Relative %: 60.3 % (ref 43.0–77.0)
PLATELETS: 266 10*3/uL (ref 150.0–400.0)
RBC: 5.02 Mil/uL (ref 3.87–5.11)
RDW: 15.2 % (ref 11.5–15.5)
WBC: 6.8 10*3/uL (ref 4.0–10.5)

## 2016-05-26 LAB — TSH: TSH: 1.69 u[IU]/mL (ref 0.35–4.50)

## 2016-05-26 LAB — HEPATIC FUNCTION PANEL
ALBUMIN: 4.4 g/dL (ref 3.5–5.2)
ALK PHOS: 81 U/L (ref 39–117)
ALT: 39 U/L — AB (ref 0–35)
AST: 23 U/L (ref 0–37)
Bilirubin, Direct: 0.1 mg/dL (ref 0.0–0.3)
Total Bilirubin: 0.9 mg/dL (ref 0.2–1.2)
Total Protein: 7 g/dL (ref 6.0–8.3)

## 2016-05-26 LAB — LIPID PANEL
CHOLESTEROL: 264 mg/dL — AB (ref 0–200)
HDL: 56.4 mg/dL (ref >39.00)
LDL Cholesterol: 195 mg/dL — ABNORMAL HIGH (ref 0–99)
NONHDL: 207.24
Total CHOL/HDL Ratio: 5
Triglycerides: 59 mg/dL (ref 0.0–149.0)
VLDL: 11.8 mg/dL (ref 0.0–40.0)

## 2016-05-26 LAB — BASIC METABOLIC PANEL
BUN: 25 mg/dL — ABNORMAL HIGH (ref 6–23)
CALCIUM: 10.1 mg/dL (ref 8.4–10.5)
CO2: 25 mEq/L (ref 19–32)
Chloride: 106 mEq/L (ref 96–112)
Creatinine, Ser: 0.72 mg/dL (ref 0.40–1.20)
GFR: 86.16 mL/min (ref >60.00)
Glucose, Bld: 93 mg/dL (ref 70–99)
POTASSIUM: 4.4 meq/L (ref 3.5–5.1)
Sodium: 140 mEq/L (ref 135–145)

## 2016-05-26 MED ORDER — ZOLPIDEM TARTRATE 10 MG PO TABS: 10.0000 mg | ORAL_TABLET | Freq: Every day | ORAL | 3 refills | Status: DC

## 2016-05-26 MED ORDER — TIZANIDINE HCL 4 MG PO TABS: 4.0000 mg | ORAL_TABLET | Freq: Four times a day (QID) | ORAL | 0 refills | Status: DC | PRN

## 2016-05-26 MED ORDER — MELOXICAM 15 MG PO TABS: 15.0000 mg | ORAL_TABLET | Freq: Every day | ORAL | 3 refills | Status: DC

## 2016-05-26 NOTE — Progress Notes (Signed)
   Subjective:    Patient ID: Lorraine [name], female    DOB: 08-11-1950, 66 y.o.   MRN: [id number]  HPI New to establish.  Previous MD- [facility] (office closed)  Health Maintenance- UTD on colonoscopy (3/17- [facility]), mammo 3/12 [facility]), DEXA 2017 (osteopenia)  Vaginal pain- only with penetration, sxs started ~1 yr ago.  'something doesn't seem right'.  Neuropathy- feet bilaterally, 'stinging and burning'.  On Gabapentin, seeing Dr Milinda Pointer.  Insomnia- pt has difficulty falling asleep.  Has been on sleep meds 'for a number of years'.  Currently on Zolpidem 10mg .  Pt is needing a refill  Arthritis- chronic problem, pt is alternating Mobic and Diclofenac.  Pt also takes Zanaflex prn.   + ANA- labs from 03/24/15 show homogenous pattern w/ titer of 1:320  Hyperlipidemia- on 03/24/15 total cholesterol 228, LDL 153   Review of Systems Denies CP, SOB, abd pain, N/V, HAs, visual changes, edema. + palpitations intermittently    Objective:   Physical Exam  Constitutional: She is oriented to person, place, and time. She appears well-developed and well-nourished. No distress.  overweight  HENT:  Head: Normocephalic and atraumatic.  Eyes: Conjunctivae and EOM are normal. Pupils are equal, round, and reactive to light.  Neck: Normal range of motion. Neck supple. No thyromegaly present.  Cardiovascular: Normal rate, regular rhythm, normal heart sounds and intact distal pulses.   No murmur heard. Pulmonary/Chest: Effort normal and breath sounds normal. No respiratory distress.  Abdominal: Soft. She exhibits no distension. There is no tenderness.  Musculoskeletal: She exhibits no edema.  Lymphadenopathy:    She has no cervical adenopathy.  Neurological: She is alert and oriented to person, place, and time.  Skin: Skin is warm and dry.  Psychiatric: She has a normal mood and affect. Her behavior is normal.  Vitals reviewed.         Assessment & Plan:

## 2016-05-26 NOTE — Assessment & Plan Note (Signed)
New to provider, ongoing for pt.  Seeing Dr Milinda Pointer.  On Gabapentin- has refills available.  Pt has not had neuropathy work up but reports this is the working dx.  Given her + ANA and high titer, this needs further investigation.  Refer to Rheum.  Pt expressed understanding and is in agreement w/ plan.

## 2016-05-26 NOTE — Patient Instructions (Signed)
Schedule your Medicare Wellness Visit with Lorraine [name] in 3-4 months and a follow up with me in 6 months to recheck Rheumatologic issues We'll notify you of your lab results and make any changes if needed We'll call you with your Rheumatology and GYN appts Restart the Mobic once daily for pain/inflammation Continue to use the Zolpidem at night for sleep Call with any questions or concerns Hang in there!!!

## 2016-05-26 NOTE — Assessment & Plan Note (Signed)
New.  Noted on record review for pt.  She has never seen rheum despite a titer of 320.  Given her neuropathy, joint pain- will need complete work up.  Referral placed.

## 2016-05-26 NOTE — Assessment & Plan Note (Signed)
New to provider, ongoing for pt x1 yr.  Pt reports 'something doesn't feel right in there'.  Is not sexually active w/ others but has pain w/ penetration during masturbation.  Given pt's concerns, will refer to GYN.  Pt expressed understanding and is in agreement w/ plan.

## 2016-05-26 NOTE — Progress Notes (Signed)
Pre visit review using our clinic review tool, if applicable. No additional management support is needed unless otherwise documented below in the visit note. 

## 2016-05-26 NOTE — Assessment & Plan Note (Signed)
New to provider, ongoing for pt.  She is attempting to control w/ diet and exercise.  Repeat labs today to determine if meds are needed.  Will follow.

## 2016-05-26 NOTE — Assessment & Plan Note (Signed)
New to provider, ongoing for pt.  Needs refill on Ambien.  Pt signed CSC in order to get refill today.

## 2016-05-26 NOTE — Assessment & Plan Note (Signed)
New to provider, ongoing for pt.  Needs refill on once daily anti-inflammatory.  Will go back to Mobic rather than Diclofenac.  Given pt's + ANA and high titer, will get Rheumatoid factor.  Refer to Rheumatology.  Pt expressed understanding and is in agreement w/ plan.

## 2016-05-27 ENCOUNTER — Other Ambulatory Visit: Payer: Self-pay | Admitting: Family Medicine

## 2016-05-27 DIAGNOSIS — E785 Hyperlipidemia, unspecified: Secondary | ICD-10-CM

## 2016-05-27 LAB — RHEUMATOID FACTOR: Rhuematoid fact SerPl-aCnc: <14 IU/mL (ref <14)

## 2016-05-27 LAB — ANTI-NUCLEAR AB-TITER (ANA TITER): ANA Titer 1: 1:160 {titer} — ABNORMAL HIGH

## 2016-05-27 LAB — ANA: ANA: POSITIVE — AB

## 2016-05-27 MED ORDER — ATORVASTATIN CALCIUM 40 MG PO TABS: 40.0000 mg | ORAL_TABLET | Freq: Every evening | ORAL | 3 refills | Status: DC

## 2016-06-02 DIAGNOSIS — H35363 Drusen (degenerative) of macula, bilateral: Secondary | ICD-10-CM | POA: Diagnosis not present

## 2016-06-08 ENCOUNTER — Ambulatory Visit (INDEPENDENT_AMBULATORY_CARE_PROVIDER_SITE_OTHER): Payer: Medicare Other | Admitting: Podiatry

## 2016-06-08 ENCOUNTER — Encounter: Payer: Self-pay | Admitting: Podiatry

## 2016-06-08 DIAGNOSIS — M722 Plantar fascial fibromatosis: Secondary | ICD-10-CM | POA: Diagnosis not present

## 2016-06-08 DIAGNOSIS — Z9889 Other specified postprocedural states: Secondary | ICD-10-CM

## 2016-06-09 NOTE — Progress Notes (Signed)
She presents today status post endoscopic plantar fasciotomy of the right foot. She states it seems to be coming along a gas that she refers to the p.m. right foot. Date of surgery June 3 19 2018.  Objective: Vital signs are stable she is alert and oriented 3. Palpation metatarsophalangeal joint plantar fascial area appears to be healing nicely as is the plantar fibroma.  Assessment: Capsulitis plantar fasciitis resolving.  Plan: Injected dexamethasone second metatarsophalangeal joint follow up with me 1 month

## 2016-06-16 ENCOUNTER — Ambulatory Visit: Payer: BC Managed Care – PPO | Admitting: Family Medicine

## 2016-06-21 DIAGNOSIS — R1900 Intra-abdominal and pelvic swelling, mass and lump, unspecified site: Secondary | ICD-10-CM | POA: Diagnosis not present

## 2016-06-21 DIAGNOSIS — Z01419 Encounter for gynecological examination (general) (routine) without abnormal findings: Secondary | ICD-10-CM | POA: Diagnosis not present

## 2016-06-21 DIAGNOSIS — N898 Other specified noninflammatory disorders of vagina: Secondary | ICD-10-CM | POA: Diagnosis not present

## 2016-06-21 DIAGNOSIS — N952 Postmenopausal atrophic vaginitis: Secondary | ICD-10-CM | POA: Diagnosis not present

## 2016-06-25 ENCOUNTER — Encounter (HOSPITAL_BASED_OUTPATIENT_CLINIC_OR_DEPARTMENT_OTHER): Payer: Self-pay | Admitting: *Deleted

## 2016-06-25 DIAGNOSIS — N898 Other specified noninflammatory disorders of vagina: Secondary | ICD-10-CM | POA: Diagnosis not present

## 2016-06-25 DIAGNOSIS — R1904 Left lower quadrant abdominal swelling, mass and lump: Secondary | ICD-10-CM | POA: Diagnosis not present

## 2016-06-25 NOTE — Progress Notes (Addendum)
NPO AFTER MN.  ARRIVE AT 0600.  NEEDS HG.  ASK MDA DOS IF EKG NEEDED , HX PVC'S.

## 2016-06-29 NOTE — H&P (Addendum)
Lorraine [name] is an 66 y.o. female. Here for vaginal cyst removal.   Pertinent Gynecological History: Menses: post-menopausal Bleeding: N/A Contraception: none DES exposure: unknown Blood transfusions: none Sexually transmitted diseases: no past history Previous GYN Procedures:Hysterecomy  Last mammogram: normal Date: last year per pt Last pap: normal Date: 2011 (s/p hyst) OB History: G1, P1001   Menstrual History: Menarche age: unknown No LMP recorded. Patient has had a hysterectomy.    Past Medical History:  Diagnosis Date  . Anxiety   . Chronic knee pain   . Chronic low back pain   . History of palpitations    2014-- low risk exercise tolerence test, no ischemi, PVC's (09-06-2012)  . Hx of colonic polyps   . Hyperlipidemia   . Insomnia   . Lactose intolerance   . Osteoarthritis    knees and lumbar  . Osteopenia   . Peripheral neuropathy    bilateral feet -- burning and stinging  . PONV (postoperative nausea and vomiting)    severe  . PVC's (premature ventricular contractions)   . Vaginal cyst   . Wears glasses     Past Surgical History:  Procedure Laterality Date  . ANTERIOR CERVICAL DECOMP/DISCECTOMY FUSION  01/2014   C4 -- C6  . CATARACT EXTRACTION W/ INTRAOCULAR LENS  IMPLANT, BILATERAL  2015  . ELBOW DEBRIDEMENT Right 09/10/2010   and tendon debridement and radial tunnel release  . EXCISION MORTON'S NEUROMA Bilateral left 02-20-2009/  right & left 05-08-2009  . PLANTAR FASCIA RELEASE Bilateral left 04-02-2010/  right 04-02-2016  . TONSILLECTOMY AND ADENOIDECTOMY  child  . TOTAL ABDOMINAL HYSTERECTOMY  1974   w/  Left salpingoophorectomy and Partial right salpingoophorectomy    Family History  Problem Relation Age of Onset  . Cancer Mother     lung  . Heart attack Father   . Memory loss Father   . Cancer Paternal Grandfather     colon    Social History:  reports that she has never smoked. She has never used smokeless tobacco. She reports that  she does not drink alcohol or use drugs.  Allergies:  Allergies  Allergen Reactions  . Codeine Nausea And Vomiting    No prescriptions prior to admission.    ROS  Height 5\' 5"  (1.651 m), weight 77.1 kg (170 lb). Physical Exam Well appearing, NAD CTAB Reg rate Abd soft, nontender, nondistended Breasts WNL GYN: large 2-3cm cytic structure at the vaginal introitus with extremely thin walls. No overlying erythema or induration. Vaginal cuff w/out any defects of abnl. Vaginal cuff well healed. Mild vaginal atrophy.  No results found for this or any previous visit (from the past 24 hour(s)).  No results found.  Assessment/Plan: G1P1 w/vaginal dryness/problems usuing vibrators, found to have a 2cm vaginal cyst. Plan for removal vs marsupialization and biopsy of cyst wall given possible bartholin's cyst in woman >7 yo. Risks discussed including infection, bleeding, damage to surrounding structures and the need for additional procedures. Consent signed and pt desires to proceed. Will initiate abx if any evidence of infection intraop. Rx already given for PO dilaudid.   Tyson Dense 06/29/2016, 2:43 PM    No changes in above H&P. Risks again discussed and patient desires to proceed. Consent signed.   Royston Sinner MD

## 2016-06-30 ENCOUNTER — Encounter (HOSPITAL_BASED_OUTPATIENT_CLINIC_OR_DEPARTMENT_OTHER): Payer: Self-pay | Admitting: Anesthesiology

## 2016-06-30 NOTE — Anesthesia Preprocedure Evaluation (Addendum)
Anesthesia Evaluation  Patient identified by MRN, date of birth, ID band Patient awake    Reviewed: Allergy & Precautions, NPO status , Patient's Chart, lab work & pertinent test results  History of Anesthesia Complications (+) PONV and history of anesthetic complications  Airway Mallampati: II  TM Distance: >3 FB Neck ROM: Full    Dental  (+) Teeth Intact, Dental Advisory Given   Pulmonary neg pulmonary ROS,    breath sounds clear to auscultation       Cardiovascular negative cardio ROS   Rhythm:Regular Rate:Normal     Neuro/Psych Anxiety  Neuromuscular disease    GI/Hepatic negative GI ROS, Neg liver ROS,   Endo/Other  negative endocrine ROS  Renal/GU negative Renal ROS  negative genitourinary   Musculoskeletal  (+) Arthritis , Osteoarthritis,    Abdominal   Peds negative pediatric ROS (+)  Hematology negative hematology ROS (+)   Anesthesia Other Findings Day of surgery medications reviewed with the patient. - HLD  Reproductive/Obstetrics negative OB ROS                            Lab Results  Component Value Date   WBC 6.8 05/26/2016   HGB 13.6 07/01/2016   HCT 43.8 05/26/2016   MCV 87.3 05/26/2016   PLT 266.0 05/26/2016   Lab Results  Component Value Date   CREATININE 0.72 05/26/2016   BUN 25 (H) 05/26/2016   NA 140 05/26/2016   K 4.4 05/26/2016   CL 106 05/26/2016   CO2 25 05/26/2016   No results found for: INR, PROTIME  06/2016 EKG: sinus bradycardia.  Anesthesia Physical Anesthesia Plan  ASA: II  Anesthesia Plan: General   Post-op Pain Management:    Induction: Intravenous  Airway Management Planned: LMA  Additional Equipment:   Intra-op Plan:   Post-operative Plan: Extubation in OR  Informed Consent: I have reviewed the patients History and Physical, chart, labs and discussed the procedure including the risks, benefits and alternatives for the  proposed anesthesia with the patient or authorized representative who has indicated his/her understanding and acceptance.   Dental advisory given  Plan Discussed with: CRNA  Anesthesia Plan Comments:         Anesthesia Quick Evaluation

## 2016-07-01 ENCOUNTER — Encounter (HOSPITAL_BASED_OUTPATIENT_CLINIC_OR_DEPARTMENT_OTHER)
Admission: RE | Disposition: A | Discharge status: Home / Self Care | Payer: Self-pay | Source: Ambulatory Visit | Attending: Obstetrics and Gynecology

## 2016-07-01 ENCOUNTER — Ambulatory Visit (HOSPITAL_BASED_OUTPATIENT_CLINIC_OR_DEPARTMENT_OTHER): Payer: Medicare Other | Admitting: Anesthesiology

## 2016-07-01 ENCOUNTER — Encounter (HOSPITAL_BASED_OUTPATIENT_CLINIC_OR_DEPARTMENT_OTHER): Payer: Self-pay | Admitting: *Deleted

## 2016-07-01 ENCOUNTER — Ambulatory Visit (HOSPITAL_BASED_OUTPATIENT_CLINIC_OR_DEPARTMENT_OTHER)
Admission: RE | Admit: 2016-07-01 | Discharge: 2016-07-01 | Disposition: A | Discharge status: Home / Self Care | Payer: Medicare Other | Source: Ambulatory Visit | Attending: Obstetrics and Gynecology | Admitting: Obstetrics and Gynecology

## 2016-07-01 DIAGNOSIS — M545 Low back pain: Secondary | ICD-10-CM | POA: Insufficient documentation

## 2016-07-01 DIAGNOSIS — L048 Acute lymphadenitis of other sites: Secondary | ICD-10-CM | POA: Insufficient documentation

## 2016-07-01 DIAGNOSIS — G8929 Other chronic pain: Secondary | ICD-10-CM | POA: Insufficient documentation

## 2016-07-01 DIAGNOSIS — Z90710 Acquired absence of both cervix and uterus: Secondary | ICD-10-CM | POA: Diagnosis not present

## 2016-07-01 DIAGNOSIS — G4700 Insomnia, unspecified: Secondary | ICD-10-CM | POA: Diagnosis not present

## 2016-07-01 DIAGNOSIS — N959 Unspecified menopausal and perimenopausal disorder: Secondary | ICD-10-CM | POA: Diagnosis not present

## 2016-07-01 DIAGNOSIS — N750 Cyst of Bartholin's gland: Secondary | ICD-10-CM | POA: Insufficient documentation

## 2016-07-01 DIAGNOSIS — E785 Hyperlipidemia, unspecified: Secondary | ICD-10-CM | POA: Insufficient documentation

## 2016-07-01 DIAGNOSIS — M1990 Unspecified osteoarthritis, unspecified site: Secondary | ICD-10-CM | POA: Insufficient documentation

## 2016-07-01 DIAGNOSIS — G629 Polyneuropathy, unspecified: Secondary | ICD-10-CM | POA: Diagnosis not present

## 2016-07-01 DIAGNOSIS — F419 Anxiety disorder, unspecified: Secondary | ICD-10-CM | POA: Diagnosis not present

## 2016-07-01 DIAGNOSIS — M25569 Pain in unspecified knee: Secondary | ICD-10-CM | POA: Diagnosis not present

## 2016-07-01 DIAGNOSIS — R112 Nausea with vomiting, unspecified: Secondary | ICD-10-CM | POA: Diagnosis not present

## 2016-07-01 DIAGNOSIS — N898 Other specified noninflammatory disorders of vagina: Secondary | ICD-10-CM | POA: Diagnosis not present

## 2016-07-01 DIAGNOSIS — R002 Palpitations: Secondary | ICD-10-CM | POA: Diagnosis not present

## 2016-07-01 HISTORY — DX: Polyneuropathy, unspecified: G62.9

## 2016-07-01 HISTORY — DX: Other chronic pain: G89.29

## 2016-07-01 HISTORY — DX: Presence of spectacles and contact lenses: Z97.3

## 2016-07-01 HISTORY — DX: Ventricular premature depolarization: I49.3

## 2016-07-01 HISTORY — PX: EXCISION VAGINAL CYST: SHX5825

## 2016-07-01 HISTORY — DX: Nausea with vomiting, unspecified: R11.2

## 2016-07-01 HISTORY — DX: Pain in unspecified knee: M25.569

## 2016-07-01 HISTORY — DX: Personal history of other specified conditions: Z87.898

## 2016-07-01 HISTORY — DX: Other specified postprocedural states: Z98.890

## 2016-07-01 HISTORY — DX: Low back pain, unspecified: M54.50

## 2016-07-01 HISTORY — DX: Low back pain: M54.5

## 2016-07-01 HISTORY — DX: Other specified noninflammatory disorders of vagina: N89.8

## 2016-07-01 LAB — POCT HEMOGLOBIN-HEMACUE: Hemoglobin: 13.6 g/dL (ref 12.0–15.0)

## 2016-07-01 SURGERY — EXCISION, CYST, VAGINA
Anesthesia: General

## 2016-07-01 MED ORDER — CEFAZOLIN SODIUM-DEXTROSE 2-4 GM/100ML-% IV SOLN
INTRAVENOUS | Status: AC
  Filled 2016-07-01: qty 100

## 2016-07-01 MED ORDER — EPHEDRINE 5 MG/ML INJ
INTRAVENOUS | Status: AC
  Filled 2016-07-01: qty 10

## 2016-07-01 MED ORDER — PROPOFOL 10 MG/ML IV BOLUS
INTRAVENOUS | Status: DC | PRN
Start: 2016-07-01 — End: 2016-07-01
  Administered 2016-07-01: 170 mg via INTRAVENOUS

## 2016-07-01 MED ORDER — LIDOCAINE 2% (20 MG/ML) 5 ML SYRINGE
INTRAMUSCULAR | Status: AC
  Filled 2016-07-01: qty 5

## 2016-07-01 MED ORDER — KETOROLAC TROMETHAMINE 30 MG/ML IJ SOLN
INTRAMUSCULAR | Status: DC | PRN
  Administered 2016-07-01: 30 mg via INTRAVENOUS

## 2016-07-01 MED ORDER — ONDANSETRON HCL 4 MG/2ML IJ SOLN
INTRAMUSCULAR | Status: AC
  Filled 2016-07-01: qty 2

## 2016-07-01 MED ORDER — FENTANYL CITRATE (PF) 100 MCG/2ML IJ SOLN
INTRAMUSCULAR | Status: AC
  Filled 2016-07-01: qty 2

## 2016-07-01 MED ORDER — DEXAMETHASONE SODIUM PHOSPHATE 10 MG/ML IJ SOLN
INTRAMUSCULAR | Status: AC
  Filled 2016-07-01: qty 1

## 2016-07-01 MED ORDER — PROPOFOL 10 MG/ML IV BOLUS
INTRAVENOUS | Status: AC
  Filled 2016-07-01: qty 20

## 2016-07-01 MED ORDER — DEXAMETHASONE SODIUM PHOSPHATE 4 MG/ML IJ SOLN
INTRAMUSCULAR | Status: DC | PRN
  Administered 2016-07-01: 10 mg via INTRAVENOUS

## 2016-07-01 MED ORDER — MIDAZOLAM HCL 2 MG/2ML IJ SOLN
INTRAMUSCULAR | Status: AC
  Filled 2016-07-01: qty 2

## 2016-07-01 MED ORDER — LIDOCAINE 2% (20 MG/ML) 5 ML SYRINGE
INTRAMUSCULAR | Status: DC | PRN
Start: 2016-07-01 — End: 2016-07-01
  Administered 2016-07-01: 60 mg via INTRAVENOUS

## 2016-07-01 MED ORDER — SULFAMETHOXAZOLE-TRIMETHOPRIM 800-160 MG PO TABS: 1.0000 | ORAL_TABLET | Freq: Two times a day (BID) | ORAL | 0 refills | Status: AC

## 2016-07-01 MED ORDER — FENTANYL CITRATE (PF) 100 MCG/2ML IJ SOLN
INTRAMUSCULAR | Status: DC | PRN
  Administered 2016-07-01: 50 ug via INTRAVENOUS

## 2016-07-01 MED ORDER — CEFAZOLIN SODIUM-DEXTROSE 2-4 GM/100ML-% IV SOLN
2.0000 g | INTRAVENOUS | Status: AC
  Administered 2016-07-01: 2 g via INTRAVENOUS
  Filled 2016-07-01: qty 100

## 2016-07-01 MED ORDER — MIDAZOLAM HCL 5 MG/5ML IJ SOLN
INTRAMUSCULAR | Status: DC | PRN
  Administered 2016-07-01 (×2): 1 mg via INTRAVENOUS

## 2016-07-01 MED ORDER — SCOPOLAMINE 1 MG/3DAYS TD PT72
MEDICATED_PATCH | TRANSDERMAL | Status: DC | PRN
  Administered 2016-07-01: 1 via TRANSDERMAL

## 2016-07-01 MED ORDER — ONDANSETRON HCL 4 MG/2ML IJ SOLN
INTRAMUSCULAR | Status: DC | PRN
  Administered 2016-07-01: 4 mg via INTRAVENOUS

## 2016-07-01 MED ORDER — SCOPOLAMINE 1 MG/3DAYS TD PT72
MEDICATED_PATCH | TRANSDERMAL | Status: AC
  Filled 2016-07-01: qty 1

## 2016-07-01 MED ORDER — LACTATED RINGERS IV SOLN
INTRAVENOUS | Status: DC
  Administered 2016-07-01: 07:00:00 via INTRAVENOUS
  Filled 2016-07-01: qty 1000

## 2016-07-01 MED ORDER — EPHEDRINE SULFATE-NACL 50-0.9 MG/10ML-% IV SOSY
PREFILLED_SYRINGE | INTRAVENOUS | Status: DC | PRN
  Administered 2016-07-01: 10 mg via INTRAVENOUS

## 2016-07-01 SURGICAL SUPPLY — 34 items
BLADE SURG 15 STRL LF DISP TIS (BLADE) ×1 IMPLANT
BLADE SURG 15 STRL SS (BLADE) ×2
CATH ROBINSON RED A/P 16FR (CATHETERS) ×1 IMPLANT
CATH ROBINSON RED A/P 8FR (CATHETERS) ×1 IMPLANT
COVER BACK TABLE 60X90IN (DRAPES) ×2 IMPLANT
DRAIN PENROSE 18X1/4 LTX STRL (WOUND CARE) ×1 IMPLANT
DRAPE HYSTEROSCOPY (DRAPE) ×2 IMPLANT
DRAPE LG THREE QUARTER DISP (DRAPES) ×2 IMPLANT
DRSG TELFA 3X8 NADH (GAUZE/BANDAGES/DRESSINGS) ×2 IMPLANT
ELECT REM PT RETURN 9FT ADLT (ELECTROSURGICAL) ×2
ELECTRODE REM PT RTRN 9FT ADLT (ELECTROSURGICAL) ×1 IMPLANT
GLOVE BIO SURGEON STRL SZ 6.5 (GLOVE) ×4 IMPLANT
GOWN STRL REUS W/TWL LRG LVL3 (GOWN DISPOSABLE) ×2 IMPLANT
KIT RM TURNOVER CYSTO AR (KITS) ×2 IMPLANT
LEGGING LITHOTOMY PAIR STRL (DRAPES) ×2 IMPLANT
NDL HYPO 25X1 1.5 SAFETY (NEEDLE) IMPLANT
NDL SPNL 22GX3.5 QUINCKE BK (NEEDLE) IMPLANT
NEEDLE HYPO 25X1 1.5 SAFETY (NEEDLE) IMPLANT
NEEDLE SPNL 22GX3.5 QUINCKE BK (NEEDLE) IMPLANT
PACK BASIN DAY SURGERY FS (CUSTOM PROCEDURE TRAY) ×2 IMPLANT
PAD DRESSING TELFA 3X8 NADH (GAUZE/BANDAGES/DRESSINGS) IMPLANT
PAD OB MATERNITY 4.3X12.25 (PERSONAL CARE ITEMS) ×2 IMPLANT
PAD PREP 24X48 CUFFED NSTRL (MISCELLANEOUS) ×2 IMPLANT
SURGILUBE 2OZ TUBE FLIPTOP (MISCELLANEOUS) ×2 IMPLANT
SUT CHROMIC 3 0 SH 27 (SUTURE) IMPLANT
SUT SILK 3 0 SH 30 (SUTURE) IMPLANT
SUT VIC AB 3-0 SH 27 (SUTURE)
SUT VIC AB 3-0 SH 27X BRD (SUTURE) IMPLANT
SYR CONTROL 10ML LL (SYRINGE) IMPLANT
TOWEL OR 17X24 6PK STRL BLUE (TOWEL DISPOSABLE) ×4 IMPLANT
TRAY DSU PREP LF (CUSTOM PROCEDURE TRAY) ×2 IMPLANT
TUBE CONNECTING 12X1/4 (SUCTIONS) ×2 IMPLANT
WATER STERILE IRR 500ML POUR (IV SOLUTION) ×2 IMPLANT
YANKAUER SUCT BULB TIP NO VENT (SUCTIONS) ×2 IMPLANT

## 2016-07-01 NOTE — Transfer of Care (Signed)
Immediate Anesthesia Transfer of Care Note  Patient: Lorraine [name]  Procedure(s) Performed: Procedure(s) (LRB): EXCISION VAGINAL CYST (N/A)  Patient Location: PACU  Anesthesia Type: General  Level of Consciousness: awake, oriented, sedated and patient cooperative  Airway & Oxygen Therapy: Patient Spontanous Breathing and Patient connected to face mask oxygen  Post-op Assessment: Report given to PACU RN and Post -op Vital signs reviewed and stable  Post vital signs: Reviewed and stable  Complications: No apparent anesthesia complications Last Vitals:  Vitals:   07/01/16 0601 07/01/16 0808  BP: (!) 123/59 134/61  Pulse: (!) 53 61  Resp: 18 13  Temp: 36.4 C 36.3 C

## 2016-07-01 NOTE — Op Note (Signed)
DATE OF OPERATION:  07/01/16  PREOPERATIVE DIAGNOSIS:  R Bartholin gland cyst  POSTOPERATIVE DIAGNOSIS:  Suspect R Bartholin gland abscess.  OPERATION PERFORMED:  Removal of Bartholin gland cyst/abscess.  SURGEON:  Lucillie Garfinkel MD  ASSISTANT:  None  ANESTHESIA:  General.  ESTIMATED BLOOD LOSS:  Negligible.  COMPLICATIONS:  None.  INDICATIONS FOR OPERATION AND OPERATIVE FINDINGS:  The patient was seen in the office prior to admission, at which time a large Bartholin gland cyst was appreciated. Due to her age of 26 and the increased risk of carcinoma, the patient opted for complete excision of the cyst.  Examination under anesthesia again noted a left Bartholin gland abscess, approximately 3 cm.   DESCRIPTION OF OPERATION:  The patient was brought to the operating room in stable condition and placed on the operating room table in the dorsal supine position. 2g ancef was given.  The patient was then administered general anesthesia without difficulty. The patient was then repositioned in the dorsal lithotomy position. Exam under anesthesia was performed. The findings on examination are delineated above. The patient was then perineally prepped and draped.  With the use of an 11 blade, a skin incision was made in the mucocutaneous junction, approximately 1 cm. The cyst protruded from the incision and metz were used to dissect the cyst from the vulvar epithelium and lyse adhesions. While attempting to grasp the cyst with an alice clamp, the cyst ruptured. Fluid cultures were sent to pathology. The cyst was was still able to be separated from the vaginal mucosa and it was sent to pathology. Hemostasis was achieved in the bed of the cyst with a combination of electrocautery and three interrupted stitches with 3'0 vicryl. The incision was closed with a running stitch and a small ~1cm drain was placed for continued drainage of the area.   At the conclusion of the procedure, the site was hemostatic. The  patient was then repositioned to the dorsal supine position and awakened from general anesthesia. The patient was transferred to the stretcher and taken to the postanesthesia recovery unit in good condition. There were no complications associated with this procedure.   Plan for removal of the drain on Monday in the office. Plan for postoperative Bactrim BID x 5 days given suspicion for possible abscess.   Arty Baumgartner MD

## 2016-07-01 NOTE — Discharge Instructions (Signed)

## 2016-07-01 NOTE — Anesthesia Postprocedure Evaluation (Addendum)
Anesthesia Post Note  Patient: RUTHE ROEMER  Procedure(s) Performed: Procedure(s) (LRB): EXCISION VAGINAL CYST (N/A)  Patient location during evaluation: PACU Anesthesia Type: General Level of consciousness: awake and alert Pain management: pain level controlled Vital Signs Assessment: post-procedure vital signs reviewed and stable Respiratory status: spontaneous breathing, nonlabored ventilation, respiratory function stable and patient connected to nasal cannula oxygen Cardiovascular status: blood pressure returned to baseline and stable Postop Assessment: no signs of nausea or vomiting Anesthetic complications: no       Last Vitals:  Vitals:   07/01/16 0915 07/01/16 0945  BP:  133/61  Pulse:  (!) 54  Resp: 14 12  Temp:  36.6 C    Last Pain:  Vitals:   07/01/16 0601  TempSrc: Oral                 Effie Berkshire

## 2016-07-01 NOTE — Anesthesia Procedure Notes (Signed)
Procedure Name: LMA Insertion Date/Time: 07/01/2016 7:26 AM Performed by: Denna Haggard D Pre-anesthesia Checklist: Patient identified, Emergency Drugs available, Suction available and Patient being monitored Patient Re-evaluated:Patient Re-evaluated prior to inductionOxygen Delivery Method: Circle system utilized Preoxygenation: Pre-oxygenation with 100% oxygen Intubation Type: IV induction Ventilation: Mask ventilation without difficulty LMA: LMA inserted LMA Size: 4.0 Number of attempts: 1 Airway Equipment and Method: Bite block Placement Confirmation: positive ETCO2 Tube secured with: Tape Dental Injury: Teeth and Oropharynx as per pre-operative assessment

## 2016-07-01 NOTE — Brief Op Note (Signed)
07/01/2016  8:14 AM  PATIENT:  Lorraine [name]  66 y.o. female  PRE-OPERATIVE DIAGNOSIS:  VAGINAL CYST, suspect bartholin's gland cyst  POST-OPERATIVE DIAGNOSIS:  VAGINAL CYST, suspect bartholin's gland abscess  PROCEDURE:  Procedure(s): EXCISION VAGINAL CYST (N/A)  SURGEON:  Surgeon(s) and Role:    * Tyson Dense, MD - Primary  PHYSICIAN ASSISTANT:   ASSISTANTS: none   ANESTHESIA:   LMA  EBL:  Total I/O In: 600 [I.V.:600] Out: 10 [Blood:10]  BLOOD ADMINISTERED:none  DRAINS: premrose vulvar drain   LOCAL MEDICATIONS USED:  NONE  SPECIMEN:  Source of Specimen:  bartholin's cyst  DISPOSITION OF SPECIMEN:  PATHOLOGY  COUNTS:  YES  TOURNIQUET:  * No tourniquets in log *  DICTATION: .Note written in EPIC  PLAN OF CARE: Discharge to home after PACU  PATIENT DISPOSITION:  PACU - hemodynamically stable.   Delay start of Pharmacological VTE agent (>24hrs) due to surgical blood loss or risk of bleeding: not applicable

## 2016-07-02 ENCOUNTER — Encounter (HOSPITAL_BASED_OUTPATIENT_CLINIC_OR_DEPARTMENT_OTHER): Payer: Self-pay | Admitting: Obstetrics and Gynecology

## 2016-07-03 LAB — AEROBIC CULTURE  (SUPERFICIAL SPECIMEN): CULTURE: NORMAL

## 2016-07-03 LAB — AEROBIC CULTURE W GRAM STAIN (SUPERFICIAL SPECIMEN)

## 2016-07-06 LAB — ANAEROBIC CULTURE

## 2016-07-09 DIAGNOSIS — E669 Obesity, unspecified: Secondary | ICD-10-CM | POA: Diagnosis not present

## 2016-07-09 DIAGNOSIS — Z6831 Body mass index (BMI) 31.0-31.9, adult: Secondary | ICD-10-CM | POA: Diagnosis not present

## 2016-07-09 DIAGNOSIS — R768 Other specified abnormal immunological findings in serum: Secondary | ICD-10-CM | POA: Diagnosis not present

## 2016-07-15 ENCOUNTER — Ambulatory Visit (INDEPENDENT_AMBULATORY_CARE_PROVIDER_SITE_OTHER): Payer: Self-pay | Admitting: Podiatry

## 2016-07-15 ENCOUNTER — Encounter: Payer: Self-pay | Admitting: Podiatry

## 2016-07-15 DIAGNOSIS — M722 Plantar fascial fibromatosis: Secondary | ICD-10-CM

## 2016-07-15 NOTE — Progress Notes (Signed)
She presents today for follow-up of her endoscopic plantar fasciotomy of the right foot and continued numbness to the toe hallux right she's also complaining of pain around the second metatarsophalangeal joint.  Objective: The major portion of the reproducible pain is on the right foot at the second metatarsophalangeal joint. Minimal pain on palpation of the rear foot. There is no edema and no erythema cellulitis drainage or odor.  Assessment: Well-healing endoscopic fasciotomy compensatory capsulitis of the second metatarsophalangeal joint and lateral aspect of the foot.  Plan: I feel we should not give her another injection at this point in time I really did recommend physical therapy and should this fail to alleviate her symptoms then orthotics will be necessary. I discussed this in great detail with her today she understands that Medicare does not cover orthotics.

## 2016-07-20 DIAGNOSIS — M25671 Stiffness of right ankle, not elsewhere classified: Secondary | ICD-10-CM | POA: Diagnosis not present

## 2016-07-20 DIAGNOSIS — M25672 Stiffness of left ankle, not elsewhere classified: Secondary | ICD-10-CM | POA: Diagnosis not present

## 2016-07-20 DIAGNOSIS — M79672 Pain in left foot: Secondary | ICD-10-CM | POA: Diagnosis not present

## 2016-07-20 DIAGNOSIS — M79671 Pain in right foot: Secondary | ICD-10-CM | POA: Diagnosis not present

## 2016-07-21 DIAGNOSIS — M25672 Stiffness of left ankle, not elsewhere classified: Secondary | ICD-10-CM | POA: Diagnosis not present

## 2016-07-21 DIAGNOSIS — M25671 Stiffness of right ankle, not elsewhere classified: Secondary | ICD-10-CM | POA: Diagnosis not present

## 2016-07-21 DIAGNOSIS — M79672 Pain in left foot: Secondary | ICD-10-CM | POA: Diagnosis not present

## 2016-07-21 DIAGNOSIS — M79671 Pain in right foot: Secondary | ICD-10-CM | POA: Diagnosis not present

## 2016-07-23 ENCOUNTER — Other Ambulatory Visit (INDEPENDENT_AMBULATORY_CARE_PROVIDER_SITE_OTHER): Payer: Medicare Other

## 2016-07-23 DIAGNOSIS — M79671 Pain in right foot: Secondary | ICD-10-CM | POA: Diagnosis not present

## 2016-07-23 DIAGNOSIS — M25671 Stiffness of right ankle, not elsewhere classified: Secondary | ICD-10-CM | POA: Diagnosis not present

## 2016-07-23 DIAGNOSIS — E785 Hyperlipidemia, unspecified: Secondary | ICD-10-CM | POA: Diagnosis not present

## 2016-07-23 DIAGNOSIS — M79672 Pain in left foot: Secondary | ICD-10-CM | POA: Diagnosis not present

## 2016-07-23 DIAGNOSIS — M25672 Stiffness of left ankle, not elsewhere classified: Secondary | ICD-10-CM | POA: Diagnosis not present

## 2016-07-23 LAB — HEPATIC FUNCTION PANEL
ALBUMIN: 4.8 g/dL (ref 3.5–5.2)
ALT: 17 U/L (ref 0–35)
AST: 14 U/L (ref 0–37)
Alkaline Phosphatase: 88 U/L (ref 39–117)
Bilirubin, Direct: 0.1 mg/dL (ref 0.0–0.3)
Total Bilirubin: 0.8 mg/dL (ref 0.2–1.2)
Total Protein: 7.3 g/dL (ref 6.0–8.3)

## 2016-07-26 DIAGNOSIS — M25672 Stiffness of left ankle, not elsewhere classified: Secondary | ICD-10-CM | POA: Diagnosis not present

## 2016-07-26 DIAGNOSIS — M79672 Pain in left foot: Secondary | ICD-10-CM | POA: Diagnosis not present

## 2016-07-26 DIAGNOSIS — M25671 Stiffness of right ankle, not elsewhere classified: Secondary | ICD-10-CM | POA: Diagnosis not present

## 2016-07-26 DIAGNOSIS — M79671 Pain in right foot: Secondary | ICD-10-CM | POA: Diagnosis not present

## 2016-07-28 DIAGNOSIS — M79672 Pain in left foot: Secondary | ICD-10-CM | POA: Diagnosis not present

## 2016-07-28 DIAGNOSIS — M79671 Pain in right foot: Secondary | ICD-10-CM | POA: Diagnosis not present

## 2016-07-28 DIAGNOSIS — M25672 Stiffness of left ankle, not elsewhere classified: Secondary | ICD-10-CM | POA: Diagnosis not present

## 2016-07-28 DIAGNOSIS — M25671 Stiffness of right ankle, not elsewhere classified: Secondary | ICD-10-CM | POA: Diagnosis not present

## 2016-07-29 DIAGNOSIS — M25672 Stiffness of left ankle, not elsewhere classified: Secondary | ICD-10-CM | POA: Diagnosis not present

## 2016-07-29 DIAGNOSIS — M25671 Stiffness of right ankle, not elsewhere classified: Secondary | ICD-10-CM | POA: Diagnosis not present

## 2016-07-29 DIAGNOSIS — M79671 Pain in right foot: Secondary | ICD-10-CM | POA: Diagnosis not present

## 2016-07-29 DIAGNOSIS — M79672 Pain in left foot: Secondary | ICD-10-CM | POA: Diagnosis not present

## 2016-08-02 DIAGNOSIS — M25671 Stiffness of right ankle, not elsewhere classified: Secondary | ICD-10-CM | POA: Diagnosis not present

## 2016-08-02 DIAGNOSIS — M79672 Pain in left foot: Secondary | ICD-10-CM | POA: Diagnosis not present

## 2016-08-02 DIAGNOSIS — M25672 Stiffness of left ankle, not elsewhere classified: Secondary | ICD-10-CM | POA: Diagnosis not present

## 2016-08-02 DIAGNOSIS — M79671 Pain in right foot: Secondary | ICD-10-CM | POA: Diagnosis not present

## 2016-08-03 DIAGNOSIS — M79671 Pain in right foot: Secondary | ICD-10-CM | POA: Diagnosis not present

## 2016-08-03 DIAGNOSIS — M25672 Stiffness of left ankle, not elsewhere classified: Secondary | ICD-10-CM | POA: Diagnosis not present

## 2016-08-03 DIAGNOSIS — M25671 Stiffness of right ankle, not elsewhere classified: Secondary | ICD-10-CM | POA: Diagnosis not present

## 2016-08-03 DIAGNOSIS — M79672 Pain in left foot: Secondary | ICD-10-CM | POA: Diagnosis not present

## 2016-08-05 DIAGNOSIS — M25672 Stiffness of left ankle, not elsewhere classified: Secondary | ICD-10-CM | POA: Diagnosis not present

## 2016-08-05 DIAGNOSIS — M25671 Stiffness of right ankle, not elsewhere classified: Secondary | ICD-10-CM | POA: Diagnosis not present

## 2016-08-05 DIAGNOSIS — M79672 Pain in left foot: Secondary | ICD-10-CM | POA: Diagnosis not present

## 2016-08-05 DIAGNOSIS — M79671 Pain in right foot: Secondary | ICD-10-CM | POA: Diagnosis not present

## 2016-08-10 DIAGNOSIS — M79671 Pain in right foot: Secondary | ICD-10-CM | POA: Diagnosis not present

## 2016-08-10 DIAGNOSIS — M79672 Pain in left foot: Secondary | ICD-10-CM | POA: Diagnosis not present

## 2016-08-10 DIAGNOSIS — M25672 Stiffness of left ankle, not elsewhere classified: Secondary | ICD-10-CM | POA: Diagnosis not present

## 2016-08-10 DIAGNOSIS — M25671 Stiffness of right ankle, not elsewhere classified: Secondary | ICD-10-CM | POA: Diagnosis not present

## 2016-08-12 ENCOUNTER — Encounter: Payer: Self-pay | Admitting: Podiatry

## 2016-08-12 ENCOUNTER — Telehealth: Payer: Self-pay | Admitting: *Deleted

## 2016-08-12 ENCOUNTER — Ambulatory Visit (INDEPENDENT_AMBULATORY_CARE_PROVIDER_SITE_OTHER): Payer: Medicare Other | Admitting: Podiatry

## 2016-08-12 ENCOUNTER — Ambulatory Visit (INDEPENDENT_AMBULATORY_CARE_PROVIDER_SITE_OTHER): Payer: Medicare Other

## 2016-08-12 DIAGNOSIS — M7751 Other enthesopathy of right foot: Secondary | ICD-10-CM | POA: Diagnosis not present

## 2016-08-12 DIAGNOSIS — M778 Other enthesopathies, not elsewhere classified: Secondary | ICD-10-CM

## 2016-08-12 DIAGNOSIS — M79671 Pain in right foot: Secondary | ICD-10-CM | POA: Diagnosis not present

## 2016-08-12 DIAGNOSIS — M722 Plantar fascial fibromatosis: Secondary | ICD-10-CM

## 2016-08-12 DIAGNOSIS — M779 Enthesopathy, unspecified: Principal | ICD-10-CM

## 2016-08-12 DIAGNOSIS — G5781 Other specified mononeuropathies of right lower limb: Secondary | ICD-10-CM

## 2016-08-12 DIAGNOSIS — G5761 Lesion of plantar nerve, right lower limb: Secondary | ICD-10-CM | POA: Diagnosis not present

## 2016-08-12 DIAGNOSIS — M25671 Stiffness of right ankle, not elsewhere classified: Secondary | ICD-10-CM | POA: Diagnosis not present

## 2016-08-12 DIAGNOSIS — M25672 Stiffness of left ankle, not elsewhere classified: Secondary | ICD-10-CM | POA: Diagnosis not present

## 2016-08-12 DIAGNOSIS — M79672 Pain in left foot: Secondary | ICD-10-CM | POA: Diagnosis not present

## 2016-08-12 MED ORDER — PREGABALIN 50 MG PO CAPS: 50.0000 mg | ORAL_CAPSULE | Freq: Two times a day (BID) | ORAL | 2 refills | Status: DC

## 2016-08-12 NOTE — Telephone Encounter (Signed)
Pt states she wanted to know if Dr. Milinda Pointer wanted her to be on the Lyrica. I reviewed today's orders and Dr. Milinda Pointer did order. I spoke with pt and she states she did come back and was given a written rx.

## 2016-08-12 NOTE — Progress Notes (Addendum)
She presents today for follow-up of her right foot endoscopic plantar fasciotomy date of surgery 04/02/2016 states that in going to PT and I think he needs to be x-rayed. She states this really been hurting around the toes and PT thinks it may be a stress fracture.  Objective: Vital signs are stable alert and oriented 3. Much decrease in pain to the endoscopic fasciotomy site. She has tenderness on palpation to the third interdigital space of the right foot with a palpable Mulder's click. Radiographs do not demonstrate any type of osseous abnormalities.  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  Plan: Injected the area today with Kenalog and local anesthetic discussed the possible need for dehydrated alcohol. Referred her back to physical therapy.  Also started her on Lyrica 50 mg 1 twice daily and she will discontinue the gabapentin. Follow up with her in the near future.

## 2016-08-13 NOTE — Addendum Note (Signed)
Addendum  created 08/13/16 1227 by Effie Berkshire, MD   Sign clinical note

## 2016-08-16 DIAGNOSIS — M25672 Stiffness of left ankle, not elsewhere classified: Secondary | ICD-10-CM | POA: Diagnosis not present

## 2016-08-16 DIAGNOSIS — M79671 Pain in right foot: Secondary | ICD-10-CM | POA: Diagnosis not present

## 2016-08-16 DIAGNOSIS — M25671 Stiffness of right ankle, not elsewhere classified: Secondary | ICD-10-CM | POA: Diagnosis not present

## 2016-08-16 DIAGNOSIS — M79672 Pain in left foot: Secondary | ICD-10-CM | POA: Diagnosis not present

## 2016-08-19 DIAGNOSIS — M25672 Stiffness of left ankle, not elsewhere classified: Secondary | ICD-10-CM | POA: Diagnosis not present

## 2016-08-19 DIAGNOSIS — M79671 Pain in right foot: Secondary | ICD-10-CM | POA: Diagnosis not present

## 2016-08-19 DIAGNOSIS — M25671 Stiffness of right ankle, not elsewhere classified: Secondary | ICD-10-CM | POA: Diagnosis not present

## 2016-08-19 DIAGNOSIS — M79672 Pain in left foot: Secondary | ICD-10-CM | POA: Diagnosis not present

## 2016-08-25 DIAGNOSIS — M25671 Stiffness of right ankle, not elsewhere classified: Secondary | ICD-10-CM | POA: Diagnosis not present

## 2016-08-25 DIAGNOSIS — M25672 Stiffness of left ankle, not elsewhere classified: Secondary | ICD-10-CM | POA: Diagnosis not present

## 2016-08-25 DIAGNOSIS — M79671 Pain in right foot: Secondary | ICD-10-CM | POA: Diagnosis not present

## 2016-08-25 DIAGNOSIS — M79672 Pain in left foot: Secondary | ICD-10-CM | POA: Diagnosis not present

## 2016-08-26 ENCOUNTER — Encounter: Payer: Self-pay | Admitting: Podiatry

## 2016-08-26 ENCOUNTER — Ambulatory Visit (INDEPENDENT_AMBULATORY_CARE_PROVIDER_SITE_OTHER): Payer: Medicare Other | Admitting: Podiatry

## 2016-08-26 DIAGNOSIS — M79671 Pain in right foot: Secondary | ICD-10-CM | POA: Diagnosis not present

## 2016-08-26 DIAGNOSIS — M778 Other enthesopathies, not elsewhere classified: Secondary | ICD-10-CM

## 2016-08-26 DIAGNOSIS — M779 Enthesopathy, unspecified: Secondary | ICD-10-CM

## 2016-08-26 DIAGNOSIS — G5761 Lesion of plantar nerve, right lower limb: Secondary | ICD-10-CM

## 2016-08-26 DIAGNOSIS — M25672 Stiffness of left ankle, not elsewhere classified: Secondary | ICD-10-CM | POA: Diagnosis not present

## 2016-08-26 DIAGNOSIS — M79672 Pain in left foot: Secondary | ICD-10-CM | POA: Diagnosis not present

## 2016-08-26 DIAGNOSIS — M7751 Other enthesopathy of right foot: Secondary | ICD-10-CM

## 2016-08-26 DIAGNOSIS — M25671 Stiffness of right ankle, not elsewhere classified: Secondary | ICD-10-CM | POA: Diagnosis not present

## 2016-08-26 DIAGNOSIS — G5781 Other specified mononeuropathies of right lower limb: Secondary | ICD-10-CM

## 2016-08-26 DIAGNOSIS — N952 Postmenopausal atrophic vaginitis: Secondary | ICD-10-CM | POA: Diagnosis not present

## 2016-08-29 NOTE — Progress Notes (Signed)
She presents today with chief complaint painful right foot. Last time we injected it she states that it felt much better.  Objective: Vital signs are stable she is alert and oriented 3. Pulses are palpable. She has palpable Mulder's click third interdigital space of the right foot. She also has pain to the dorsal aspect of the right foot.  Assessment: Neuritis neuroma third interdigital space right capsulitis dorsal aspect right.  Plan: Injected the dorsal aspect of the dexamethasone local anesthetic also injected the first dose of dehydrated alcohol to the right foot.

## 2016-08-30 DIAGNOSIS — M25671 Stiffness of right ankle, not elsewhere classified: Secondary | ICD-10-CM | POA: Diagnosis not present

## 2016-08-30 DIAGNOSIS — M79671 Pain in right foot: Secondary | ICD-10-CM | POA: Diagnosis not present

## 2016-08-30 DIAGNOSIS — M79672 Pain in left foot: Secondary | ICD-10-CM | POA: Diagnosis not present

## 2016-08-30 DIAGNOSIS — M25672 Stiffness of left ankle, not elsewhere classified: Secondary | ICD-10-CM | POA: Diagnosis not present

## 2016-09-02 DIAGNOSIS — M25671 Stiffness of right ankle, not elsewhere classified: Secondary | ICD-10-CM | POA: Diagnosis not present

## 2016-09-02 DIAGNOSIS — M79671 Pain in right foot: Secondary | ICD-10-CM | POA: Diagnosis not present

## 2016-09-02 DIAGNOSIS — M79672 Pain in left foot: Secondary | ICD-10-CM | POA: Diagnosis not present

## 2016-09-02 DIAGNOSIS — M25672 Stiffness of left ankle, not elsewhere classified: Secondary | ICD-10-CM | POA: Diagnosis not present

## 2016-09-06 DIAGNOSIS — M79671 Pain in right foot: Secondary | ICD-10-CM | POA: Diagnosis not present

## 2016-09-06 DIAGNOSIS — M25672 Stiffness of left ankle, not elsewhere classified: Secondary | ICD-10-CM | POA: Diagnosis not present

## 2016-09-06 DIAGNOSIS — M25671 Stiffness of right ankle, not elsewhere classified: Secondary | ICD-10-CM | POA: Diagnosis not present

## 2016-09-06 DIAGNOSIS — M79672 Pain in left foot: Secondary | ICD-10-CM | POA: Diagnosis not present

## 2016-09-09 DIAGNOSIS — M79672 Pain in left foot: Secondary | ICD-10-CM | POA: Diagnosis not present

## 2016-09-09 DIAGNOSIS — M25672 Stiffness of left ankle, not elsewhere classified: Secondary | ICD-10-CM | POA: Diagnosis not present

## 2016-09-09 DIAGNOSIS — M25671 Stiffness of right ankle, not elsewhere classified: Secondary | ICD-10-CM | POA: Diagnosis not present

## 2016-09-09 DIAGNOSIS — M79671 Pain in right foot: Secondary | ICD-10-CM | POA: Diagnosis not present

## 2016-09-13 DIAGNOSIS — M25671 Stiffness of right ankle, not elsewhere classified: Secondary | ICD-10-CM | POA: Diagnosis not present

## 2016-09-13 DIAGNOSIS — M79671 Pain in right foot: Secondary | ICD-10-CM | POA: Diagnosis not present

## 2016-09-13 DIAGNOSIS — M25672 Stiffness of left ankle, not elsewhere classified: Secondary | ICD-10-CM | POA: Diagnosis not present

## 2016-09-13 DIAGNOSIS — M79672 Pain in left foot: Secondary | ICD-10-CM | POA: Diagnosis not present

## 2016-09-13 DIAGNOSIS — M7751 Other enthesopathy of right foot: Secondary | ICD-10-CM | POA: Diagnosis not present

## 2016-09-14 ENCOUNTER — Encounter: Payer: Self-pay | Admitting: Podiatry

## 2016-09-14 ENCOUNTER — Ambulatory Visit (INDEPENDENT_AMBULATORY_CARE_PROVIDER_SITE_OTHER): Payer: Medicare Other | Admitting: Podiatry

## 2016-09-14 DIAGNOSIS — G5761 Lesion of plantar nerve, right lower limb: Secondary | ICD-10-CM | POA: Diagnosis not present

## 2016-09-14 DIAGNOSIS — M792 Neuralgia and neuritis, unspecified: Secondary | ICD-10-CM | POA: Diagnosis not present

## 2016-09-14 DIAGNOSIS — G5781 Other specified mononeuropathies of right lower limb: Secondary | ICD-10-CM

## 2016-09-14 NOTE — Progress Notes (Signed)
She presents today for follow-up of her neuroma or neuritis third interdigital space right foot and the first intermetatarsal space. She states that the neuroma is doing better with first metatarsal space is still quite tender.  Objective: Vital signs stable she is alert and oriented 3 status painful Mulder's plate to the thirdjust between the metatarsal heads right foot. She also has tenderness on palpation of the first metatarsal space consistent with the peroneal nerve neuritis.  Assessment: Deep peroneal nerve neuritis as well as neuroma third interdigital space right foot.  Plan: Discussed etiology pathology inserted surgical therapies injected the third interdigital space as well as the first interdigital space of the alcohol this awaits second injection to the third interdigital space in the first to the first. Follow-up with her in 3 weeks area

## 2016-09-22 NOTE — Progress Notes (Addendum)
Subjective:   Lorraine [name] is a 66 y.o. female who presents for an Initial Medicare Annual Wellness Visit.  Review of Systems    No ROS.  Medicare Wellness Visit. Additional risk factors are reflected in the social history.  Cardiac Risk Factors include: dyslipidemia;advanced age (>45mn, >>45women)   Sleep patterns: Sleeps 6 hours, feels rested. Takes Ambien. Up to void x 3.  Home Safety/Smoke Alarms: Feels safe in home. Smoke alarms in place.  Living environment; residence and Firearm Safety: Lives alone in 1 story home.  Seat Belt Safety/Bike Helmet: Wears seat belt.   Counseling:   Eye Exam-Last exam 10/2015, yearly Dr. TJoya San Pt reports [facility].   Dental-Last exam 04/2016, every 6 months. Dr. JWynetta Emery  Female:   Pap-N/A      Mammo-05/24/2016, negative.       Dexa scan-05/07/2015, Osteopenia      [facility]-Colonoscopy 05/27/2015, polyp. Recall 5 years. Dr. OPaulita Fujita     Objective:    Today's Vitals   09/23/16 0824  BP: 118/68  Pulse: 66  SpO2: 98%  Weight: 176 lb 6.4 oz (80 kg)  Height: _0  (1.651 m)   Body mass index is 29.35 kg/m.   Current Medications (verified) Outpatient Encounter Prescriptions as of 09/23/2016  Medication Sig  . atorvastatin (LIPITOR) 40 MG tablet Take 1 tablet (40 mg total) by mouth every evening. (Patient taking differently: Take 40 mg by mouth every evening. )  . gabapentin (NEURONTIN) 300 MG capsule Take one capsule by mouth at lunch and one capsule by mouth at bedtime. (Patient taking differently: Take one capsule by mouth at lunch and one capsule by mouth at bedtime.)  . MAGNESIUM PO Take by mouth 3 (three) times a week.  . meloxicam (MOBIC) 15 MG tablet Take 1 tablet (15 mg total) by mouth daily. (Patient taking differently: Take 15 mg by mouth every morning. )  . POTASSIUM CHLORIDE PO Take by mouth.  .Marland KitchentiZANidine (ZANAFLEX) 4 MG tablet Take 1 tablet (4 mg total) by mouth every 6 (six) hours as needed for muscle spasms.  .  valACYclovir (VALTREX) 1000 MG tablet Take 1,000 mg by mouth 2 (two) times daily as needed.   . Vitamin D-Vitamin K (VITAMIN K2-VITAMIN D3) 45-2000 MCG-UNIT CAPS Take by mouth 3 (three) times a week.  . zolpidem (AMBIEN) 10 MG tablet Take 1 tablet (10 mg total) by mouth at bedtime.  . Omega-3 Fatty Acids (FISH OIL PO) Take by mouth 3 (three) times a week.    No facility-administered encounter medications on file as of 09/23/2016.     Allergies (verified) Codeine   History: Past Medical History:  Diagnosis Date  . Anxiety   . Chronic knee pain   . Chronic low back pain   . History of palpitations    2014-- low risk exercise tolerence test, no ischemi, PVC's (09-06-2012)  . Hx of colonic polyps   . Hyperlipidemia   . Insomnia   . Lactose intolerance   . Osteoarthritis    knees and lumbar  . Osteopenia   . Peripheral neuropathy    bilateral feet -- burning and stinging  . PONV (postoperative nausea and vomiting)    severe  . PVC's (premature ventricular contractions)   . Vaginal cyst   . Wears glasses    Past Surgical History:  Procedure Laterality Date  . ANTERIOR CERVICAL DECOMP/DISCECTOMY FUSION  01/2014   C4 -- C6  . CATARACT EXTRACTION W/ INTRAOCULAR LENS  IMPLANT,  BILATERAL  2015  . ELBOW DEBRIDEMENT Right 09/10/2010   and tendon debridement and radial tunnel release  . EXCISION MORTON'S NEUROMA Bilateral left 02-20-2009/  right & left 05-08-2009  . EXCISION VAGINAL CYST N/A 07/01/2016   Procedure: EXCISION VAGINAL CYST;  Surgeon: Tyson Dense, MD;  Location[facility];  Service: Gynecology;  Laterality: N/A;  . FOOT SURGERY Right 03/15/2016  . PLANTAR FASCIA RELEASE Bilateral left 04-02-2010/  right 04-02-2016  . TONSILLECTOMY AND ADENOIDECTOMY  child  . TOTAL ABDOMINAL HYSTERECTOMY  1974   w/  Left salpingoophorectomy and Partial right salpingoophorectomy   Family History  Problem Relation Age of Onset  . Cancer Mother        lung  .  Heart attack Father   . Memory loss Father   . Cancer Paternal Grandfather        colon   Social History   Occupational History  . Not on file.   Social History Main Topics  . Smoking status: Never Smoker  . Smokeless tobacco: Never Used  . Alcohol use No  . Drug use: No  . Sexual activity: Not on file    Tobacco Counseling Counseling given: Not Answered   Activities of Daily Living In your present state of health, do you have any difficulty performing the following activities: 09/23/2016 07/01/2016  Hearing? N N  Vision? N N  Difficulty concentrating or making decisions? N N  Walking or climbing stairs? N N  Dressing or bathing? N N  Doing errands, shopping? N -  Preparing Food and eating ? N -  Using the Toilet? N -  In the past six months, have you accidently leaked urine? N -  Do you have problems with loss of bowel control? N -  Managing your Medications? N -  Managing your Finances? N -  Housekeeping or managing your Housekeeping? N -  Some recent data might be hidden    Immunizations and Health Maintenance Immunization History  Administered Date(s) Administered  . Influenza,inj,Quad PF,36+ Mos 11/14/2015  . Pneumococcal Conjugate-13 06/14/2015  . Tdap 07/13/2015   Health Maintenance Due  Topic Date Due  . Hepatitis C Screening  Mar 26, 1950  . PNA vac Low Risk Adult (2 of 2 - PPSV23) 06/13/2016    Patient Care Team: [name], MD as PCP - General (Family Medicine) Royston Sinner, Colin Benton, MD as Consulting Physician (Obstetrics and Gynecology) [name], [place] as Consulting Physician (Podiatry) [name], MD as Consulting Physician (Rheumatology) [name], MD as Attending Physician (Orthopedic Surgery)  Indicate any recent Medical Services you may have received from other than [facility] providers in the past year (date may be approximate).     Assessment:   This is a routine wellness examination for Lorraine [name]. Physical assessment  deferred to PCP.   Hearing/Vision screen Hearing Screening Comments: Able to hear conversational tones w/o difficulty. No issues reported.   Vision Screening Comments: Wears glasses. H/O Cataract surgery 2015  Dietary issues and exercise activities discussed: Current Exercise Habits: Home exercise routine (Works full time. ), Type of exercise: treadmill;Other - see comments (water aerobics), Time (Minutes): 45, Frequency (Times/Week): 4, Weekly Exercise (Minutes/Week): 180, Exercise limited by: orthopedic condition(s)   Diet (meal preparation, eat out, water intake, caffeinated beverages, dairy products, fruits and vegetables): Drinks water.   Breakfast: eggs, bread Lunch: vegetables, protein Dinner: vegetables, fruit, protein, salads  Discussed heart healthy diet and staying active.   Goals    None  Depression Screen PHQ 2/9 Scores 09/23/2016 05/26/2016  PHQ - 2 Score 0 0  PHQ- 9 Score - 0    Fall Risk Fall Risk  09/23/2016 05/26/2016  Falls in the past year? No No  Risk for fall due to : History of fall(s) -    Cognitive Function: MMSE - Mini Mental State Exam 09/23/2016  Orientation to time 5  Orientation to Place 5  Registration 3  Attention/ Calculation 5  Recall 2  Language- name 2 objects 2  Language- repeat 1  Language- follow 3 step command 3  Language- read & follow direction 1  Write a sentence 1  Copy design 1  Total score 29        Screening Tests Health Maintenance  Topic Date Due  . Hepatitis C Screening  January 17, 1951  . PNA vac Low Risk Adult (2 of 2 - PPSV23) 06/13/2016  . INFLUENZA VACCINE  10/13/2016  . MAMMOGRAM  05/25/2018  . COLONOSCOPY  05/13/2025  . TETANUS/TDAP  07/12/2025  . DEXA SCAN  Completed   Declines Shingrix Vaccine d/t cost.     Plan:    Bring a copy of your advance directives to your next office visit.  Continue doing brain stimulating activities (puzzles, reading, adult coloring books, staying active) to keep memory  sharp.   I have personally reviewed and noted the following in the patient's chart:   . Medical and social history . Use of alcohol, tobacco or illicit drugs  . Current medications and supplements . Functional ability and status . Nutritional status . Physical activity . Advanced directives . List of other physicians . Hospitalizations, surgeries, and ER visits in previous 12 months . Vitals . Screenings to include cognitive, depression, and falls . Referrals and appointments  In addition, I have reviewed and discussed with patient certain preventive protocols, quality metrics, and best practice recommendations. A written personalized care plan for preventive services as well as general preventive health recommendations were provided to patient.     Gerilyn Nestle, RN   09/23/2016    PCP Notes: -Would like to discuss at next F/U with PCP (11/23/2016) ---Patient has questions regarding labs to dx Alzheimer's. Reports "brain is blank" occasionally. [facility] of [facility]'s. MMS Exam 29/30.  ---Would like to start receiving B12 injections (reports NO h/o B12 deficiency)   Reviewed documentation and agree w/ above.  Will discuss her questions at the next visit as there is no lab testing for dementia and unless verified low B12, no need for injxns  Annye Asa

## 2016-09-23 ENCOUNTER — Ambulatory Visit (INDEPENDENT_AMBULATORY_CARE_PROVIDER_SITE_OTHER): Payer: Medicare Other

## 2016-09-23 ENCOUNTER — Telehealth: Payer: Self-pay

## 2016-09-23 DIAGNOSIS — E785 Hyperlipidemia, unspecified: Secondary | ICD-10-CM

## 2016-09-23 DIAGNOSIS — Z0000 Encounter for general adult medical examination without abnormal findings: Secondary | ICD-10-CM

## 2016-09-23 DIAGNOSIS — Z23 Encounter for immunization: Secondary | ICD-10-CM | POA: Diagnosis not present

## 2016-09-23 MED ORDER — ATORVASTATIN CALCIUM 40 MG PO TABS: 40.0000 mg | ORAL_TABLET | Freq: Every evening | ORAL | 3 refills | Status: DC

## 2016-09-23 MED ORDER — ZOLPIDEM TARTRATE 10 MG PO TABS: 10.0000 mg | ORAL_TABLET | Freq: Every day | ORAL | 3 refills | Status: DC

## 2016-09-23 MED ORDER — MELOXICAM 15 MG PO TABS: 15.0000 mg | ORAL_TABLET | Freq: Every day | ORAL | 3 refills | Status: DC

## 2016-09-23 NOTE — Telephone Encounter (Signed)
Medication filled to pharmacy as requested.   

## 2016-09-23 NOTE — Telephone Encounter (Signed)
Patient calling because [facility] is saying they have only received RX for the Atorvastatin and Meloxicam.  Patient is just wanting to verify that the other medications will be called in as well.

## 2016-09-23 NOTE — Telephone Encounter (Signed)
This will be sent in to the pharmacy.

## 2016-09-23 NOTE — Telephone Encounter (Signed)
Ok for Atorvastatin, Meloxicm, Tizanidine, Zolpidem, #30 3 for each

## 2016-09-23 NOTE — Telephone Encounter (Signed)
Patient in for AWV today, requesting refills for:  -Atorvastatin 40mg  (Pt states does not take on daily basis because it makes her weak, currently on hold by pt) -Meloxicam 15 mg -Tizanidine 4 mg -Zolpidem 10 mg  Patient has f/u appt with PCP on 11/23/2016

## 2016-09-23 NOTE — Patient Instructions (Addendum)
Bring a copy of your advance directives to your next office visit.  Continue doing brain stimulating activities (puzzles, reading, adult coloring books, staying active) to keep memory sharp.    Fall Prevention in the Home Falls can cause injuries. They can happen to people of all ages. There are many things you can do to make your home safe and to help prevent falls. What can I do on the outside of my home?  Regularly fix the edges of walkways and driveways and fix any cracks.  Remove anything that might make you trip as you walk through a door, such as a raised step or threshold.  Trim any bushes or trees on the path to your home.  Use bright outdoor lighting.  Clear any walking paths of anything that might make someone trip, such as rocks or tools.  Regularly check to see if handrails are loose or broken. Make sure that both sides of any steps have handrails.  Any raised decks and porches should have guardrails on the edges.  Have any leaves, snow, or ice cleared regularly.  Use sand or salt on walking paths during winter.  Clean up any spills in your garage right away. This includes oil or grease spills. What can I do in the bathroom?  Use night lights.  Install grab bars by the toilet and in the tub and shower. Do not use towel bars as grab bars.  Use non-skid mats or decals in the tub or shower.  If you need to sit down in the shower, use a plastic, non-slip stool.  Keep the floor dry. Clean up any water that spills on the floor as soon as it happens.  Remove soap buildup in the tub or shower regularly.  Attach bath mats securely with double-sided non-slip rug tape.  Do not have throw rugs and other things on the floor that can make you trip. What can I do in the bedroom?  Use night lights.  Make sure that you have a light by your bed that is easy to reach.  Do not use any sheets or blankets that are too big for your bed. They should not hang down onto the  floor.  Have a firm chair that has side arms. You can use this for support while you get dressed.  Do not have throw rugs and other things on the floor that can make you trip. What can I do in the kitchen?  Clean up any spills right away.  Avoid walking on wet floors.  Keep items that you use a lot in easy-to-reach places.  If you need to reach something above you, use a strong step stool that has a grab bar.  Keep electrical cords out of the way.  Do not use floor polish or wax that makes floors slippery. If you must use wax, use non-skid floor wax.  Do not have throw rugs and other things on the floor that can make you trip. What can I do with my stairs?  Do not leave any items on the stairs.  Make sure that there are handrails on both sides of the stairs and use them. Fix handrails that are broken or loose. Make sure that handrails are as long as the stairways.  Check any carpeting to make sure that it is firmly attached to the stairs. Fix any carpet that is loose or worn.  Avoid having throw rugs at the top or bottom of the stairs. If you do have throw rugs,  attach them to the floor with carpet tape.  Make sure that you have a light switch at the top of the stairs and the bottom of the stairs. If you do not have them, ask someone to add them for you. What else can I do to help prevent falls?  Wear shoes that: ? Do not have high heels. ? Have rubber bottoms. ? Are comfortable and fit you well. ? Are closed at the toe. Do not wear sandals.  If you use a stepladder: ? Make sure that it is fully opened. Do not climb a closed stepladder. ? Make sure that both sides of the stepladder are locked into place. ? Ask someone to hold it for you, if possible.  Clearly mark and make sure that you can see: ? Any grab bars or handrails. ? First and last steps. ? Where the edge of each step is.  Use tools that help you move around (mobility aids) if they are needed. These  include: ? Canes. ? Walkers. ? Scooters. ? Crutches.  Turn on the lights when you go into a dark area. Replace any light bulbs as soon as they burn out.  Set up your furniture so you have a clear path. Avoid moving your furniture around.  If any of your floors are uneven, fix them.  If there are any pets around you, be aware of where they are.  Review your medicines with your doctor. Some medicines can make you feel dizzy. This can increase your chance of falling. Ask your doctor what other things that you can do to help prevent falls. This information is not intended to replace advice given to you by your health care provider. Make sure you discuss any questions you have with your health care provider. Document Released: 12/26/2008 Document Revised: 08/07/2015 Document Reviewed: 04/05/2014 Elsevier Interactive Patient Education  2018 [place] Maintenance, Female Adopting a healthy lifestyle and getting preventive care can go a long way to promote health and wellness. Talk with your health care provider about what schedule of regular examinations is right for you. This is a good chance for you to check in with your provider about disease prevention and staying healthy. In between checkups, there are plenty of things you can do on your own. Experts have done a lot of research about which lifestyle changes and preventive measures are most likely to keep you healthy. Ask your health care provider for more information. Weight and diet Eat a healthy diet  Be sure to include plenty of vegetables, fruits, low-fat dairy products, and lean protein.  Do not eat a lot of foods high in solid fats, added sugars, or salt.  Get regular exercise. This is one of the most important things you can do for your health. ? Most adults should exercise for at least 150 minutes each week. The exercise should increase your heart rate and make you sweat (moderate-intensity exercise). ? Most adults  should also do strengthening exercises at least twice a week. This is in addition to the moderate-intensity exercise.  Maintain a healthy weight  Body mass index (BMI) is a measurement that can be used to identify possible weight problems. It estimates body fat based on height and weight. Your health care provider can help determine your BMI and help you achieve or maintain a healthy weight.  For females 35 years of age and older: ? A BMI below 18.5 is considered underweight. ? A BMI of 18.5 to 24.9 is normal. ?  A BMI of 25 to 29.9 is considered overweight. ? A BMI of 30 and above is considered obese.  Watch levels of cholesterol and blood lipids  You should start having your blood tested for lipids and cholesterol at 66 years of age, then have this test every 5 years.  You may need to have your cholesterol levels checked more often if: ? Your lipid or cholesterol levels are high. ? You are older than 66 years of age. ? You are at high risk for heart disease.  Cancer screening Lung Cancer  Lung cancer screening is recommended for adults 20-45 years old who are at high risk for lung cancer because of a history of smoking.  A yearly low-dose CT scan of the lungs is recommended for people who: ? Currently smoke. ? Have quit within the past 15 years. ? Have at least a 30-pack-year history of smoking. A pack year is smoking an average of one pack of cigarettes a day for 1 year.  Yearly screening should continue until it has been 15 years since you quit.  Yearly screening should stop if you develop a health problem that would prevent you from having lung cancer treatment.  Breast Cancer  Practice breast self-awareness. This means understanding how your breasts normally appear and feel.  It also means doing regular breast self-exams. Let your health care provider know about any changes, no matter how small.  If you are in your 20s or 30s, you should have a clinical breast exam (CBE)  by a health care provider every 1-3 years as part of a regular health exam.  If you are 49 or older, have a CBE every year. Also consider having a breast X-ray (mammogram) every year.  If you have a family history of breast cancer, talk to your health care provider about genetic screening.  If you are at high risk for breast cancer, talk to your health care provider about having an MRI and a mammogram every year.  Breast cancer gene (BRCA) assessment is recommended for women who have family members with BRCA-related cancers. BRCA-related cancers include: ? Breast. ? Ovarian. ? Tubal. ? Peritoneal cancers.  Results of the assessment will determine the need for genetic counseling and BRCA1 and BRCA2 testing.  Cervical Cancer Your health care provider may recommend that you be screened regularly for cancer of the pelvic organs (ovaries, uterus, and vagina). This screening involves a pelvic examination, including checking for microscopic changes to the surface of your cervix (Pap test). You may be encouraged to have this screening done every 3 years, beginning at age 80.  For women ages 18-65, health care providers may recommend pelvic exams and Pap testing every 3 years, or they may recommend the Pap and pelvic exam, combined with testing for human papilloma virus (HPV), every 5 years. Some types of HPV increase your risk of cervical cancer. Testing for HPV may also be done on women of any age with unclear Pap test results.  Other health care providers may not recommend any screening for nonpregnant women who are considered low risk for pelvic cancer and who do not have symptoms. Ask your health care provider if a screening pelvic exam is right for you.  If you have had past treatment for cervical cancer or a condition that could lead to cancer, you need Pap tests and screening for cancer for at least 20 years after your treatment. If Pap tests have been discontinued, your risk factors (such as  having a  new sexual partner) need to be reassessed to determine if screening should resume. Some women have medical problems that increase the chance of getting cervical cancer. In these cases, your health care provider may recommend more frequent screening and Pap tests.  Colorectal Cancer  This type of cancer can be detected and often prevented.  Routine colorectal cancer screening usually begins at 66 years of age and continues through 66 years of age.  Your health care provider may recommend screening at an earlier age if you have risk factors for colon cancer.  Your health care provider may also recommend using home test kits to check for hidden blood in the stool.  A small camera at the end of a tube can be used to examine your colon directly (sigmoidoscopy or colonoscopy). This is done to check for the earliest forms of colorectal cancer.  Routine screening usually begins at age 67.  Direct examination of the colon should be repeated every 5-10 years through 66 years of age. However, you may need to be screened more often if early forms of precancerous polyps or small growths are found.  Skin Cancer  Check your skin from head to toe regularly.  Tell your health care provider about any new moles or changes in moles, especially if there is a change in a mole's shape or color.  Also tell your health care provider if you have a mole that is larger than the size of a pencil eraser.  Always use sunscreen. Apply sunscreen liberally and repeatedly throughout the day.  Protect yourself by wearing long sleeves, pants, a wide-brimmed hat, and sunglasses whenever you are outside.  Heart disease, diabetes, and high blood pressure  High blood pressure causes heart disease and increases the risk of stroke. High blood pressure is more likely to develop in: ? People who have blood pressure in the high end of the normal range (130-139/85-89 mm Hg). ? People who are overweight or  obese. ? People who are African American.  If you are 7-49 years of age, have your blood pressure checked every 3-5 years. If you are 76 years of age or older, have your blood pressure checked every year. You should have your blood pressure measured twice-once when you are at a hospital or clinic, and once when you are not at a hospital or clinic. Record the average of the two measurements. To check your blood pressure when you are not at a hospital or clinic, you can use: ? An automated blood pressure machine at a pharmacy. ? A home blood pressure monitor.  If you are between 75 years and 31 years old, ask your health care provider if you should take aspirin to prevent strokes.  Have regular diabetes screenings. This involves taking a blood sample to check your fasting blood sugar level. ? If you are at a normal weight and have a low risk for diabetes, have this test once every three years after 66 years of age. ? If you are overweight and have a high risk for diabetes, consider being tested at a younger age or more often. Preventing infection Hepatitis B  If you have a higher risk for hepatitis B, you should be screened for this virus. You are considered at high risk for hepatitis B if: ? You were born in a country where hepatitis B is common. Ask your health care provider which countries are considered high risk. ? Your parents were born in a high-risk country, and you have not been immunized  against hepatitis B (hepatitis B vaccine). ? You have HIV or AIDS. ? You use needles to inject street drugs. ? You live with someone who has hepatitis B. ? You have had sex with someone who has hepatitis B. ? You get hemodialysis treatment. ? You take certain medicines for conditions, including cancer, organ transplantation, and autoimmune conditions.  Hepatitis C  Blood testing is recommended for: ? Everyone born from [birth year] through 1965. ? Anyone with known risk factors for hepatitis  C.  Sexually transmitted infections (STIs)  You should be screened for sexually transmitted infections (STIs) including gonorrhea and chlamydia if: ? You are sexually active and are younger than 66 years of age. ? You are older than 66 years of age and your health care provider tells you that you are at risk for this type of infection. ? Your sexual activity has changed since you were last screened and you are at an increased risk for chlamydia or gonorrhea. Ask your health care provider if you are at risk.  If you do not have HIV, but are at risk, it may be recommended that you take a prescription medicine daily to prevent HIV infection. This is called pre-exposure prophylaxis (PrEP). You are considered at risk if: ? You are sexually active and do not regularly use condoms or know the HIV status of your partner(s). ? You take drugs by injection. ? You are sexually active with a partner who has HIV.  Talk with your health care provider about whether you are at high risk of being infected with HIV. If you choose to begin PrEP, you should first be tested for HIV. You should then be tested every 3 months for as long as you are taking PrEP. Pregnancy  If you are premenopausal and you may become pregnant, ask your health care provider about preconception counseling.  If you may become pregnant, take 400 to 800 micrograms (mcg) of folic acid every day.  If you want to prevent pregnancy, talk to your health care provider about birth control (contraception). Osteoporosis and menopause  Osteoporosis is a disease in which the bones lose minerals and strength with aging. This can result in serious bone fractures. Your risk for osteoporosis can be identified using a bone density scan.  If you are 52 years of age or older, or if you are at risk for osteoporosis and fractures, ask your health care provider if you should be screened.  Ask your health care provider whether you should take a calcium or  vitamin D supplement to lower your risk for osteoporosis.  Menopause may have certain physical symptoms and risks.  Hormone replacement therapy may reduce some of these symptoms and risks. Talk to your health care provider about whether hormone replacement therapy is right for you. Follow these instructions at home:  Schedule regular health, dental, and eye exams.  Stay current with your immunizations.  Do not use any tobacco products including cigarettes, chewing tobacco, or electronic cigarettes.  If you are pregnant, do not drink alcohol.  If you are breastfeeding, limit how much and how often you drink alcohol.  Limit alcohol intake to no more than 1 drink per day for nonpregnant women. One drink equals 12 ounces of beer, 5 ounces of wine, or 1 ounces of hard liquor.  Do not use street drugs.  Do not share needles.  Ask your health care provider for help if you need support or information about quitting drugs.  Tell your health care provider if  you often feel depressed.  Tell your health care provider if you have ever been abused or do not feel safe at home. This information is not intended to replace advice given to you by your health care provider. Make sure you discuss any questions you have with your health care provider. Document Released: 09/14/2010 Document Revised: 08/07/2015 Document Reviewed: 12/03/2014 Elsevier Interactive Patient Education  [employer].

## 2016-09-24 ENCOUNTER — Other Ambulatory Visit: Payer: Self-pay | Admitting: Family Medicine

## 2016-09-27 MED ORDER — TIZANIDINE HCL 4 MG PO TABS: 4.0000 mg | ORAL_TABLET | Freq: Four times a day (QID) | ORAL | 0 refills | Status: DC | PRN

## 2016-09-27 NOTE — Telephone Encounter (Signed)
Last OV 05/26/16 zanaflex last filled 05/26/16 #30 with 0

## 2016-10-12 ENCOUNTER — Encounter: Payer: Self-pay | Admitting: Podiatry

## 2016-10-12 ENCOUNTER — Ambulatory Visit (INDEPENDENT_AMBULATORY_CARE_PROVIDER_SITE_OTHER): Payer: Medicare Other | Admitting: Podiatry

## 2016-10-12 DIAGNOSIS — G5761 Lesion of plantar nerve, right lower limb: Secondary | ICD-10-CM | POA: Diagnosis not present

## 2016-10-12 DIAGNOSIS — G5781 Other specified mononeuropathies of right lower limb: Secondary | ICD-10-CM

## 2016-10-12 DIAGNOSIS — M792 Neuralgia and neuritis, unspecified: Secondary | ICD-10-CM | POA: Diagnosis not present

## 2016-10-13 NOTE — Progress Notes (Signed)
She presents today for follow-up of neuroma. She states that her condition is moving in the correct direction. She states it is better than it was.  Objective: Vital signs are stable she is alert and oriented 3. Pulses are palpable. Still has pain on palpation to the first intermetatarsal space proximally and she also has pain on palpation to the third interdigital space of the right foot.  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 Neuroma third interspace right foot.  Plan: Injected another dose of dehydrated alcohol first intermetatarsal space this will be her second injection to the first intermetatarsal space and third injection to the third intermetatarsal space. I will follow-up with him in 3 weeks

## 2016-11-03 DIAGNOSIS — H35363 Drusen (degenerative) of macula, bilateral: Secondary | ICD-10-CM | POA: Diagnosis not present

## 2016-11-09 ENCOUNTER — Encounter: Payer: Self-pay | Admitting: Podiatry

## 2016-11-09 ENCOUNTER — Ambulatory Visit (INDEPENDENT_AMBULATORY_CARE_PROVIDER_SITE_OTHER): Payer: Medicare Other | Admitting: Podiatry

## 2016-11-09 DIAGNOSIS — G5761 Lesion of plantar nerve, right lower limb: Secondary | ICD-10-CM

## 2016-11-09 DIAGNOSIS — M792 Neuralgia and neuritis, unspecified: Secondary | ICD-10-CM | POA: Diagnosis not present

## 2016-11-09 DIAGNOSIS — G5781 Other specified mononeuropathies of right lower limb: Secondary | ICD-10-CM

## 2016-11-09 NOTE — Progress Notes (Signed)
She presents today for follow-up of her neuritis and neuroma. She states that she is actually starting to do better as we are to inject the fourth dose into the third interdigital space and the third dose into the first interdigital space.  Objective: She still has tenderness on palpation of the neuroma third interdigital space right worse in the first interdigital space right.  Assessment: Neuritis deep peroneal nerve right foot. Neuroma third interdigital space right foot.  Plan: Injected dehydrated alcohol first and third interdigital spaces of the right foot. Follow up with her in 1 month

## 2016-11-23 ENCOUNTER — Encounter: Payer: Self-pay | Admitting: Family Medicine

## 2016-11-23 ENCOUNTER — Ambulatory Visit (INDEPENDENT_AMBULATORY_CARE_PROVIDER_SITE_OTHER): Payer: Medicare Other | Admitting: Family Medicine

## 2016-11-23 VITALS — BP 132/92 | HR 53 | Temp 98.1°F | Resp 16 | Ht 65.0 in | Wt 177.0 lb

## 2016-11-23 DIAGNOSIS — Z23 Encounter for immunization: Secondary | ICD-10-CM | POA: Diagnosis not present

## 2016-11-23 DIAGNOSIS — E785 Hyperlipidemia, unspecified: Secondary | ICD-10-CM

## 2016-11-23 DIAGNOSIS — B029 Zoster without complications: Secondary | ICD-10-CM | POA: Diagnosis not present

## 2016-11-23 DIAGNOSIS — R768 Other specified abnormal immunological findings in serum: Secondary | ICD-10-CM

## 2016-11-23 LAB — BASIC METABOLIC PANEL
BUN: 22 mg/dL (ref 6–23)
CHLORIDE: 106 meq/L (ref 96–112)
CO2: 27 mEq/L (ref 19–32)
Calcium: 9.7 mg/dL (ref 8.4–10.5)
Creatinine, Ser: 0.68 mg/dL (ref 0.40–1.20)
GFR: 91.89 mL/min (ref >60.00)
Glucose, Bld: 93 mg/dL (ref 70–99)
POTASSIUM: 4.6 meq/L (ref 3.5–5.1)
Sodium: 141 mEq/L (ref 135–145)

## 2016-11-23 LAB — LIPID PANEL
CHOL/HDL RATIO: 4
Cholesterol: 200 mg/dL (ref 0–200)
HDL: 53.9 mg/dL (ref >39.00)
LDL Cholesterol: 135 mg/dL — ABNORMAL HIGH (ref 0–99)
NonHDL: 145.62
TRIGLYCERIDES: 52 mg/dL (ref 0.0–149.0)
VLDL: 10.4 mg/dL (ref 0.0–40.0)

## 2016-11-23 LAB — HEPATIC FUNCTION PANEL
ALT: 15 U/L (ref 0–35)
AST: 12 U/L (ref 0–37)
Albumin: 4.2 g/dL (ref 3.5–5.2)
Alkaline Phosphatase: 80 U/L (ref 39–117)
BILIRUBIN DIRECT: 0.1 mg/dL (ref 0.0–0.3)
Total Bilirubin: 0.8 mg/dL (ref 0.2–1.2)
Total Protein: 6.6 g/dL (ref 6.0–8.3)

## 2016-11-23 MED ORDER — TIZANIDINE HCL 4 MG PO TABS: 4.0000 mg | ORAL_TABLET | Freq: Four times a day (QID) | ORAL | 0 refills | Status: DC | PRN

## 2016-11-23 NOTE — Progress Notes (Signed)
   Subjective:    Patient ID: Lorraine [name], female    DOB: Apr 09, 1950, 66 y.o.   MRN: [id number]  HPI Arthritis issues- pt had + ANA of 1:320.  Was referred to Rheumatology for complete evaluation.  Pt was told she did not have Lupus- she is pleased by this.  The plan is for yearly follow up w/ Rheum.  Hyperlipidemia- pt was started on Lipitor 40mg  in March due to LDL of 195.  Denies abd pain, N/V.  Pt reports 'it weakens me'- and will take 1/2 tab daily and take breaks as needed based on sxs.  Rash- developed yesterday on L shoulder.  Has been under a lot of stress recently and working a lot of overtime.  Had shingles shot at age 19.  Pt has Valtrex at home for cold sores.   Review of Systems For ROS see HPI     Objective:   Physical Exam  Constitutional: She is oriented to person, place, and time. She appears well-developed and well-nourished. No distress.  HENT:  Head: Normocephalic and atraumatic.  Eyes: Pupils are equal, round, and reactive to light. Conjunctivae and EOM are normal.  Neck: Normal range of motion. Neck supple. No thyromegaly present.  Cardiovascular: Normal rate, regular rhythm, normal heart sounds and intact distal pulses.   No murmur heard. Pulmonary/Chest: Effort normal and breath sounds normal. No respiratory distress.  Abdominal: Soft. She exhibits no distension. There is no tenderness.  Musculoskeletal: She exhibits no edema.  Lymphadenopathy:    She has no cervical adenopathy.  Neurological: She is alert and oriented to person, place, and time.  Skin: Skin is warm and dry. Rash (vesicular rash on L shoulder consistent w/ Zoster) noted.  Psychiatric: She has a normal mood and affect. Her behavior is normal.  Vitals reviewed.         Assessment & Plan:

## 2016-11-23 NOTE — Assessment & Plan Note (Signed)
Pt has been to Rheum and the plan is to follow up yearly to monitor for emergence of sxs.  She reports she is currently seeing Ortho for knee pain and will get injxns prn.  Will continue to follow along.

## 2016-11-23 NOTE — Assessment & Plan Note (Signed)
Pt was started on Statin at last visit.  She reports that she will take this medication but adjust the dose and the frequency based on how she is feeling.  Stressed that it works best when taken daily but pt is committed to her unique scheduling of medication.  Check labs.  Adjust meds prn

## 2016-11-23 NOTE — Progress Notes (Signed)
Pre visit review using our clinic review tool, if applicable. No additional management support is needed unless otherwise documented below in the visit note. 

## 2016-11-23 NOTE — Assessment & Plan Note (Signed)
New.  Pt's rash is consistent w/ shingles.  Her lack of pain is likely due to the fact that she has been previously vaccinated.  Start Valtrex as directed.  Pt expressed understanding and is in agreement w/ plan.

## 2016-11-23 NOTE — Patient Instructions (Signed)
Schedule your Medicare Wellness Visit w/ [facility] notify you of your lab results and make any changes if needed Start the Valtrex 3x/day x7 days for the shingles Continue to work on healthy diet and regular exercise- you can do it!!! Call with any questions or concerns Stay safe with the weather!!!

## 2016-11-24 ENCOUNTER — Encounter: Payer: Self-pay | Admitting: General Practice

## 2016-11-25 ENCOUNTER — Ambulatory Visit: Payer: Medicare Other | Admitting: Family Medicine

## 2016-12-02 ENCOUNTER — Ambulatory Visit (INDEPENDENT_AMBULATORY_CARE_PROVIDER_SITE_OTHER): Payer: Medicare Other | Admitting: Podiatry

## 2016-12-02 DIAGNOSIS — G5781 Other specified mononeuropathies of right lower limb: Secondary | ICD-10-CM

## 2016-12-02 DIAGNOSIS — G5761 Lesion of plantar nerve, right lower limb: Secondary | ICD-10-CM

## 2016-12-02 NOTE — Progress Notes (Signed)
She presents today for follow-up of pain to her right foot. States that she is just recently developed shingles on her left shoulder and upper left thorax. She is currently taking antivirals. She states that it is not very painful however she has started to develop more pain in her feet and was wondering whether or not it may be associated with her shingles outbreak. At this point she states that it is more painful to the third interdigital space of the right foot than it was last week.  Objective: Vital signs are stable alert and oriented 3. Pulses are palpable. She has a palpable Mulder's click to the third interdigital space of the right foot. She also has severe pain on palpation of the deep peroneal nerve of the right foot.  Assessment: Neuroma third interdigital space right neuritis deep peroneal nerve right foot.  Plan: Discussed etiology pathology conservative versus surgical therapies. At this point I reinjected another dose of dehydrated alcohol to the first interdigital space and the third interdigital space of the right foot. Follow-up with her in 3-4 weeks. If she is not improved we'll consider neurology appointment or biopsy.

## 2016-12-21 DIAGNOSIS — M7712 Lateral epicondylitis, left elbow: Secondary | ICD-10-CM | POA: Diagnosis not present

## 2016-12-21 DIAGNOSIS — M25561 Pain in right knee: Secondary | ICD-10-CM | POA: Diagnosis not present

## 2017-01-04 ENCOUNTER — Encounter: Payer: Self-pay | Admitting: Podiatry

## 2017-01-04 ENCOUNTER — Ambulatory Visit (INDEPENDENT_AMBULATORY_CARE_PROVIDER_SITE_OTHER): Payer: Medicare Other | Admitting: Podiatry

## 2017-01-04 DIAGNOSIS — G5761 Lesion of plantar nerve, right lower limb: Secondary | ICD-10-CM

## 2017-01-04 DIAGNOSIS — G5781 Other specified mononeuropathies of right lower limb: Secondary | ICD-10-CM

## 2017-01-04 DIAGNOSIS — M792 Neuralgia and neuritis, unspecified: Secondary | ICD-10-CM

## 2017-01-04 MED ORDER — METHYLPREDNISOLONE 4 MG PO TBPK: ORAL_TABLET | ORAL | 0 refills | Status: DC

## 2017-01-05 NOTE — Progress Notes (Signed)
She presents today for follow-up of a neuroma third interdigital space of the right foot and the deep peroneal neuritis of the right foot. She states that the neuroma seems to be doing better as does the neuritis after a knee injection by Dr. Percell Miller 12/31/2016. She states that she noticed an improvement in her foot immediately following the cortisone injection to her knee.  Objective: Vital signs are stable she is alert and oriented 3. Still has pain on palpation to the deep peroneal nerve in the first intermetatarsal space. Much decreased pain on palpation to the third interdigital space.  Assessment: Resolving neuroma third interdigital space right deep peroneal neuritis right foot.  Plan: At this point also repaired to inject dehydrated alcohol however she is asking for cortisone. I see no harm and injecting cortisone at this time she would also like to consider a pulse pack. She states that this seemed to work better initially and she would like to consider continuing the cortisone prednisone effect since it worked well for her knee and her foot. At this point I injected just the first intermetatarsal space with 20 mg of Kenalog and local anesthetic. I then prescribed a 6 day Medrol Dosepak and will follow-up with her in 1 month

## 2017-01-20 ENCOUNTER — Encounter: Payer: Self-pay | Admitting: Family Medicine

## 2017-01-20 MED ORDER — ZOLPIDEM TARTRATE 10 MG PO TABS: 10.0000 mg | ORAL_TABLET | Freq: Every day | ORAL | 3 refills | Status: DC

## 2017-01-20 MED ORDER — MELOXICAM 15 MG PO TABS: 15.0000 mg | ORAL_TABLET | Freq: Every day | ORAL | 3 refills | Status: DC

## 2017-01-20 NOTE — Telephone Encounter (Signed)
Last OV 11/23/16 meloxicam last filled 09/23/16 #30 with 3 Zolpidem last filled 09/23/16 #30 with 3

## 2017-01-20 NOTE — Telephone Encounter (Signed)
Medication filled to pharmacy as requested.   

## 2017-02-01 ENCOUNTER — Telehealth: Payer: Self-pay | Admitting: Podiatry

## 2017-02-01 ENCOUNTER — Ambulatory Visit (INDEPENDENT_AMBULATORY_CARE_PROVIDER_SITE_OTHER): Payer: Medicare Other | Admitting: Podiatry

## 2017-02-01 ENCOUNTER — Encounter: Payer: Self-pay | Admitting: Podiatry

## 2017-02-01 DIAGNOSIS — G5781 Other specified mononeuropathies of right lower limb: Secondary | ICD-10-CM

## 2017-02-01 DIAGNOSIS — G5761 Lesion of plantar nerve, right lower limb: Secondary | ICD-10-CM | POA: Diagnosis not present

## 2017-02-01 DIAGNOSIS — M792 Neuralgia and neuritis, unspecified: Secondary | ICD-10-CM

## 2017-02-01 MED ORDER — AMITRIPTYLINE HCL 150 MG PO TABS: 150.0000 mg | ORAL_TABLET | Freq: Every day | ORAL | 1 refills | Status: DC

## 2017-02-01 NOTE — Telephone Encounter (Signed)
Left message informing pt that she could take 1/2 of the amitriptyline at bedtime.

## 2017-02-01 NOTE — Telephone Encounter (Signed)
Call her and let her know that it was sent over.  I only want her to take 1/2 a tablet at bedtime.

## 2017-02-01 NOTE — Progress Notes (Signed)
She presents today for follow-up of her neuritis and neuroma to the right foot. She states that deathly feels better than it did but is just not doing well. She states that the steroid injections did not really work last time.  Objective: Signs are stable she is alert and oriented 3. Still has pain on palpation third interdigital space. First interdigital space is painful as well.  Assessment: Neuritis possible neuropathy possible neuroma.  Plan: We will start her on Elavil 150 mg. She will take one half tablet at nighttime. We will consider a biopsy for epidermal nerve fiber consultation in 1 month.

## 2017-02-01 NOTE — Telephone Encounter (Signed)
Pt said she was just in office and went to  [employer] in [place], [place] not there. Just checking

## 2017-03-01 ENCOUNTER — Ambulatory Visit (INDEPENDENT_AMBULATORY_CARE_PROVIDER_SITE_OTHER): Payer: Medicare Other | Admitting: Podiatry

## 2017-03-01 ENCOUNTER — Encounter: Payer: Self-pay | Admitting: Podiatry

## 2017-03-01 DIAGNOSIS — G5793 Unspecified mononeuropathy of bilateral lower limbs: Secondary | ICD-10-CM | POA: Diagnosis not present

## 2017-03-01 MED ORDER — NORTRIPTYLINE HCL 10 MG PO CAPS: 10.0000 mg | ORAL_CAPSULE | Freq: Every day | ORAL | 1 refills | Status: DC

## 2017-03-01 NOTE — Progress Notes (Signed)
She presents today for follow-up of her neuropathy.  States that her feet and legs have been doing much better since she started taking the Elavil but she is taking less than 75 mg of Elavil because she is cutting on 150 mg tablet into 3 pieces.  She states that she is starting to have some urinary retention at this point he would like to try nortriptyline at 10 mg which is what her friend has been suggesting.  She would also like to go ahead and have the biopsies done today.  Objective: Vital signs are stable alert and oriented x3.  Pulses are palpable.  No change in neurologic sensation.  Assessment: Neuropathy.  Plan: At this point we performed nerve biopsies after localization.  The area was prepped and draped with a Betadine swabs and the pack.  2 mm punch biopsies were taken 4 cm above the lateral malleolus bilaterally.  She tolerated procedure well without complications I did write a prescription for nortriptyline 10 mg to be taken at nighttime.

## 2017-03-03 ENCOUNTER — Ambulatory Visit (INDEPENDENT_AMBULATORY_CARE_PROVIDER_SITE_OTHER): Payer: Medicare Other | Admitting: Family Medicine

## 2017-03-03 ENCOUNTER — Encounter: Payer: Self-pay | Admitting: Family Medicine

## 2017-03-03 VITALS — BP 144/82 | HR 78 | Temp 98.1°F | Ht 65.0 in | Wt 183.0 lb

## 2017-03-03 DIAGNOSIS — R102 Pelvic and perineal pain unspecified side: Secondary | ICD-10-CM

## 2017-03-03 DIAGNOSIS — R350 Frequency of micturition: Secondary | ICD-10-CM

## 2017-03-03 DIAGNOSIS — R82998 Other abnormal findings in urine: Secondary | ICD-10-CM | POA: Diagnosis not present

## 2017-03-03 LAB — POCT URINALYSIS DIPSTICK
Blood, UA: NEGATIVE
PH UA: 6 (ref 5.0–8.0)
PROTEIN UA: NEGATIVE
SPEC GRAV UA: 1.02 (ref 1.010–1.025)
UROBILINOGEN UA: 4 U/dL — AB

## 2017-03-03 MED ORDER — CEPHALEXIN 500 MG PO CAPS: 500.0000 mg | ORAL_CAPSULE | Freq: Two times a day (BID) | ORAL | 0 refills | Status: DC

## 2017-03-03 NOTE — Progress Notes (Signed)
Subjective   CC:  Chief Complaint  Patient presents with  . Urinary Frequency    but going in small amounts, not empting  . plevic pressure    HPI: Lorraine [name] is a 66 y.o. female who presents to the office today to address the problems listed above in the chief complaint.  Patient reports dysuria and urinary frequency x 3 days.  She has sensation of increased urinary pressure and poor bladder emptying. Has been on amitrypiline x 30days for possible peripheral neuropathy. She stopped this 3 days ago.  She denies fevers flank pain nausea vomiting or gross hematuria.  Symptoms have been present for several  days.  She denies history of interstitial cystitis.  She denies vaginal symptoms including vaginal discharge or pelvic pain. No f/c/s.   I reviewed the patients updated PMH, FH, and SocHx.    Patient Active Problem List   Diagnosis Date Noted  . Shingles 11/23/2016  . Arthritis 05/26/2016  . Vaginal pain 05/26/2016  . Insomnia 05/26/2016  . Neuropathy 05/26/2016  . Positive ANA (antinuclear antibody) 05/26/2016  . Hyperlipidemia 05/26/2016  . Nausea & vomiting 01/26/2014  . Dehydration 01/26/2014  . Neck and shoulder pain 01/26/2014  . Cervicalgia 01/26/2014  . Palpitations 08/31/2012  . METATARSALGIA 05/06/2010  . PLANTAR FASCIITIS, LEFT 05/06/2010  . FOOT PAIN, BILATERAL 05/06/2010   Current Meds  Medication Sig  . MAGNESIUM PO Take by mouth 3 (three) times a week.  . meloxicam (MOBIC) 15 MG tablet Take 1 tablet (15 mg total) daily by mouth.  Marland Kitchen POTASSIUM CHLORIDE PO Take 99 mg by mouth daily.   Marland Kitchen tiZANidine (ZANAFLEX) 4 MG tablet Take 1 tablet (4 mg total) by mouth every 6 (six) hours as needed for muscle spasms.  . Vitamin D-Vitamin K (VITAMIN K2-VITAMIN D3) 45-2000 MCG-UNIT CAPS Take by mouth 3 (three) times a week.  . zolpidem (AMBIEN) 10 MG tablet Take 1 tablet (10 mg total) at bedtime by mouth.    Review of Systems: Cardiovascular: negative for chest  pain Respiratory: negative for SOB or persistent cough Gastrointestinal: negative for abdominal pain Constitutional: Negative for fever malaise or anorexia  Objective  Vitals: BP (!) 144/82 (BP Location: Left Arm, Patient Position: Sitting, Cuff Size: Normal)   Pulse 78   Temp 98.1 F (36.7 C) (Oral)   Ht 5\' 5"  (1.651 m)   Wt 183 lb (83 kg)   SpO2 97%   BMI 30.45 kg/m  General: no acute distress  Psych:  Alert and oriented, normal mood and affect Cardiovascular:  RRR without murmur or gallop. no peripheral edema Respiratory:  Good breath sounds bilaterally, CTAB with normal respiratory effort Gastrointestinal: soft, flat abdomen, normal active bowel sounds, no palpable masses, no hepatosplenomegaly, no appreciated hernias, NO CVAT, mild suprapubic ttp w/o rebound or guarding, no distention of bladder appreciated Skin:  Warm, no rashes Neurologic:   Mental status is normal. normal gait ON AZO Office Visit on 03/03/2017  Component Date Value Ref Range Status  . Color, UA 03/03/2017 Orange   Final  . Clarity, UA 03/03/2017 cloudy   Final  . Glucose, UA 03/03/2017 +-   Final  . Bilirubin, UA 03/03/2017 3+   Final  . Ketones, UA 03/03/2017 +-   Final  . Spec Grav, UA 03/03/2017 1.020  1.010 - 1.025 Final  . Blood, UA 03/03/2017 negative   Final  . pH, UA 03/03/2017 6.0  5.0 - 8.0 Final  . Protein, UA 03/03/2017 negative  Final  . Urobilinogen, UA 03/03/2017 4.0* 0.2 or 1.0 E.U./dL Final  . Nitrite, UA 03/03/2017 +   Final  . Leukocytes, UA 03/03/2017 Large (3+)* Negative Final  . Appearance 03/03/2017    Final    Assessment  1. Urinary frequency   2. Leukocytes in urine      Plan   Presumed UTI: Start abx, agree with stopping anticholinergic, and should improve in next 48 hours. F/u if not improved. To ER for pelvic pain..  Follow up: prn    Commons side effects, risks, benefits, and alternatives for medications and treatment plan prescribed today were discussed, and  the patient expressed understanding of the given instructions. Patient is instructed to call or message via MyChart if he/she has any questions or concerns regarding our treatment plan. No barriers to understanding were identified. We discussed Red Flag symptoms and signs in detail. Patient expressed understanding regarding what to do in case of urgent or emergency type symptoms.   Medication list was reconciled, printed and provided to the patient in AVS. Patient instructions and summary information was reviewed with the patient as documented in the AVS. This note was prepared with assistance of Dragon voice recognition software. Occasional wrong-word or sound-a-like substitutions may have occurred due to the inherent limitations of voice recognition software  Orders Placed This Encounter  Procedures  . Urine Culture  . POCT Urinalysis Dipstick   Meds ordered this encounter  Medications  . cephALEXin (KEFLEX) 500 MG capsule    Sig: Take 1 capsule (500 mg total) by mouth 2 (two) times daily.    Dispense:  10 capsule    Refill:  0

## 2017-03-04 ENCOUNTER — Telehealth: Payer: Self-pay | Admitting: *Deleted

## 2017-03-04 LAB — URINE CULTURE
MICRO NUMBER: 81434379
Result:: NO GROWTH
SPECIMEN QUALITY: ADEQUATE

## 2017-03-04 NOTE — Telephone Encounter (Signed)
Nerve biopsy specimen was not picked up by [employer]. I spoke with Fredderick Phenix, she states the specimen is not viable, and test will need to be performed again and if pt is charged she should contact them and it will be discounted. I explained the problem to pt and transferred to scheduler to be scheduled for 03/10/2017 with Dr. Milinda Pointer.

## 2017-03-07 ENCOUNTER — Encounter: Payer: Self-pay | Admitting: Family Medicine

## 2017-03-07 ENCOUNTER — Other Ambulatory Visit: Payer: Self-pay

## 2017-03-07 ENCOUNTER — Emergency Department (HOSPITAL_COMMUNITY)
Admission: EM | Admit: 2017-03-07 | Discharge: 2017-03-07 | Disposition: A | Discharge status: Home / Self Care | Payer: Medicare Other | Attending: Emergency Medicine | Admitting: Emergency Medicine

## 2017-03-07 ENCOUNTER — Encounter: Payer: Self-pay | Admitting: General Practice

## 2017-03-07 ENCOUNTER — Ambulatory Visit (INDEPENDENT_AMBULATORY_CARE_PROVIDER_SITE_OTHER): Payer: Medicare Other | Admitting: Family Medicine

## 2017-03-07 ENCOUNTER — Encounter (HOSPITAL_COMMUNITY): Payer: Self-pay | Admitting: Emergency Medicine

## 2017-03-07 VITALS — BP 130/82 | HR 81 | Temp 98.0°F | Resp 17 | Ht 65.0 in | Wt 177.2 lb

## 2017-03-07 DIAGNOSIS — R350 Frequency of micturition: Secondary | ICD-10-CM | POA: Diagnosis not present

## 2017-03-07 DIAGNOSIS — R339 Retention of urine, unspecified: Secondary | ICD-10-CM

## 2017-03-07 DIAGNOSIS — R338 Other retention of urine: Secondary | ICD-10-CM | POA: Insufficient documentation

## 2017-03-07 DIAGNOSIS — R1030 Lower abdominal pain, unspecified: Secondary | ICD-10-CM | POA: Diagnosis not present

## 2017-03-07 DIAGNOSIS — Z79899 Other long term (current) drug therapy: Secondary | ICD-10-CM | POA: Insufficient documentation

## 2017-03-07 LAB — COMPREHENSIVE METABOLIC PANEL
ALBUMIN: 4.5 g/dL (ref 3.5–5.0)
ALT: 21 U/L (ref 14–54)
AST: 34 U/L (ref 15–41)
Alkaline Phosphatase: 90 U/L (ref 38–126)
Anion gap: 11 (ref 5–15)
BILIRUBIN TOTAL: 1.3 mg/dL — AB (ref 0.3–1.2)
BUN: 28 mg/dL — AB (ref 6–20)
CHLORIDE: 105 mmol/L (ref 101–111)
CO2: 18 mmol/L — ABNORMAL LOW (ref 22–32)
CREATININE: 0.79 mg/dL (ref 0.44–1.00)
Calcium: 9.3 mg/dL (ref 8.9–10.3)
GFR calc Af Amer: >60 mL/min (ref >60)
GLUCOSE: 77 mg/dL (ref 65–99)
POTASSIUM: 4.9 mmol/L (ref 3.5–5.1)
Sodium: 134 mmol/L — ABNORMAL LOW (ref 135–145)
Total Protein: 8 g/dL (ref 6.5–8.1)

## 2017-03-07 LAB — URINALYSIS, ROUTINE W REFLEX MICROSCOPIC
Bilirubin Urine: NEGATIVE
Glucose, UA: NEGATIVE mg/dL
Hgb urine dipstick: NEGATIVE
Ketones, ur: 80 mg/dL — AB
LEUKOCYTES UA: NEGATIVE
NITRITE: NEGATIVE
PH: 5 (ref 5.0–8.0)
Protein, ur: NEGATIVE mg/dL
SPECIFIC GRAVITY, URINE: 1.023 (ref 1.005–1.030)

## 2017-03-07 LAB — POCT URINALYSIS DIPSTICK
Glucose, UA: NEGATIVE
Leukocytes, UA: NEGATIVE
NITRITE UA: POSITIVE
PH UA: 5 (ref 5.0–8.0)
PROTEIN UA: NEGATIVE
UROBILINOGEN UA: 1 U/dL

## 2017-03-07 LAB — CBC WITH DIFFERENTIAL/PLATELET
Basophils Absolute: 0 10*3/uL (ref 0.0–0.1)
Basophils Relative: 0 %
EOS ABS: 0.1 10*3/uL (ref 0.0–0.7)
EOS PCT: 1 %
HCT: 46.6 % — ABNORMAL HIGH (ref 36.0–46.0)
Hemoglobin: 16 g/dL — ABNORMAL HIGH (ref 12.0–15.0)
LYMPHS ABS: 3.4 10*3/uL (ref 0.7–4.0)
LYMPHS PCT: 35 %
MCH: 29.5 pg (ref 26.0–34.0)
MCHC: 34.3 g/dL (ref 30.0–36.0)
MCV: 86 fL (ref 78.0–100.0)
MONO ABS: 0.7 10*3/uL (ref 0.1–1.0)
Monocytes Relative: 7 %
Neutro Abs: 5.4 10*3/uL (ref 1.7–7.7)
Neutrophils Relative %: 57 %
PLATELETS: 241 10*3/uL (ref 150–400)
RBC: 5.42 MIL/uL — AB (ref 3.87–5.11)
RDW: 15 % (ref 11.5–15.5)
WBC: 9.6 10*3/uL (ref 4.0–10.5)

## 2017-03-07 MED ORDER — BETHANECHOL CHLORIDE 10 MG PO TABS: 10.0000 mg | ORAL_TABLET | Freq: Three times a day (TID) | ORAL | 0 refills | Status: DC

## 2017-03-07 NOTE — ED Provider Notes (Signed)
[facility] DEPT Provider Note   CSN: [id number] Arrival date & time: 03/07/17  1459     History   Chief Complaint Chief Complaint  Patient presents with  . Urinary Retention    HPI Lorraine [name] is a 66 y.o. female with past medical history of anxiety, hyperlipidemia, presenting to the ED with 1 week of urinary retention.  Patient has been followed by her PCP throughout this past week for these symptoms.  Per chart review, patient was recently started on amitriptyline and it was suspected that the anticholinergic effect from this medication was causing her retention.  Started on a 5-day course of Keflex for possible, and has 2 remaining doses. U/A done in clinic last week was neg.  She was seen again this morning and given bethanechol times to improve her bladder emptying.  She was instructed to report to the ED if it did not work in a few hours.  She states she took the medication, however the feeling of pressure remains.  She states she feels like she has lots of pressure down in her bladder area, and when she attempts to urinate she only has a few dribbles of urine come out.  She states sometimes when she stands she feels as though she is going to wet herself, however has not done so.  Denies any other abdominal pain, flank pain, nausea, vomiting, dysuria, vaginal bleeding or discharge.  The history is provided by the patient.    Past Medical History:  Diagnosis Date  . Anxiety   . Chronic knee pain   . Chronic low back pain   . History of palpitations    2014-- low risk exercise tolerence test, no ischemi, PVC's (09-06-2012)  . Hx of colonic polyps   . Hyperlipidemia   . Insomnia   . Lactose intolerance   . Osteoarthritis    knees and lumbar  . Osteopenia   . Peripheral neuropathy    bilateral feet -- burning and stinging  . PONV (postoperative nausea and vomiting)    severe  . PVC's (premature ventricular contractions)   . Vaginal cyst     . Wears glasses     Patient Active Problem List   Diagnosis Date Noted  . Shingles 11/23/2016  . Arthritis 05/26/2016  . Vaginal pain 05/26/2016  . Insomnia 05/26/2016  . Neuropathy 05/26/2016  . Positive ANA (antinuclear antibody) 05/26/2016  . Hyperlipidemia 05/26/2016  . Nausea & vomiting 01/26/2014  . Dehydration 01/26/2014  . Neck and shoulder pain 01/26/2014  . Cervicalgia 01/26/2014  . Palpitations 08/31/2012  . METATARSALGIA 05/06/2010  . PLANTAR FASCIITIS, LEFT 05/06/2010  . FOOT PAIN, BILATERAL 05/06/2010    Past Surgical History:  Procedure Laterality Date  . ANTERIOR CERVICAL DECOMP/DISCECTOMY FUSION  01/2014   C4 -- C6  . CATARACT EXTRACTION W/ INTRAOCULAR LENS  IMPLANT, BILATERAL  2015  . ELBOW DEBRIDEMENT Right 09/10/2010   and tendon debridement and radial tunnel release  . EXCISION MORTON'S NEUROMA Bilateral left 02-20-2009/  right & left 05-08-2009  . EXCISION VAGINAL CYST N/A 07/01/2016   Procedure: EXCISION VAGINAL CYST;  Surgeon: Tyson Dense, MD;  Location: [facility];  Service: Gynecology;  Laterality: N/A;  . FOOT SURGERY Right 03/15/2016  . PLANTAR FASCIA RELEASE Bilateral left 04-02-2010/  right 04-02-2016  . TONSILLECTOMY AND ADENOIDECTOMY  child  . TOTAL ABDOMINAL HYSTERECTOMY  1974   w/  Left salpingoophorectomy and Partial right salpingoophorectomy    OB History  No data available       Home Medications    Prior to Admission medications   Medication Sig Start Date End Date Taking? Authorizing Provider  bethanechol (URECHOLINE) 10 MG tablet Take 1 tablet (10 mg total) by mouth 3 (three) times daily. 03/07/17  Yes [name], MD  cephALEXin (KEFLEX) 500 MG capsule Take 1 capsule (500 mg total) by mouth 2 (two) times daily. 03/03/17  Yes Leamon Arnt, MD  MAGNESIUM PO Take by mouth 3 (three) times a week.   Yes [provider]  meloxicam (MOBIC) 15 MG tablet Take 1 tablet (15 mg total) daily  by mouth. 01/20/17  Yes [name], MD  POTASSIUM CHLORIDE PO Take 99 mg by mouth daily.    Yes [provider]  tiZANidine (ZANAFLEX) 4 MG tablet Take 1 tablet (4 mg total) by mouth every 6 (six) hours as needed for muscle spasms. 11/23/16  Yes [name], MD  Vitamin D-Vitamin K (VITAMIN K2-VITAMIN D3) 45-2000 MCG-UNIT CAPS Take by mouth 3 (three) times a week.   Yes [provider]  zolpidem (AMBIEN) 10 MG tablet Take 1 tablet (10 mg total) at bedtime by mouth. 01/20/17  Yes [name], MD  atorvastatin (LIPITOR) 40 MG tablet Take 1 tablet (40 mg total) by mouth every evening. Patient not taking: Reported on 03/03/2017 09/23/16   [name], MD  nortriptyline (PAMELOR) 10 MG capsule Take 1 capsule (10 mg total) by mouth at bedtime. Patient not taking: Reported on 03/03/2017 03/01/17   [name], DPM    Family History Family History  Problem Relation Age of Onset  . Cancer Mother        lung  . Heart attack Father   . Memory loss Father   . Cancer Paternal Grandfather        colon    Social History Social History   Tobacco Use  . Smoking status: Never Smoker  . Smokeless tobacco: Never Used  Substance Use Topics  . Alcohol use: No  . Drug use: No     Allergies   Codeine   Review of Systems Review of Systems  Constitutional: Negative for fever.  Gastrointestinal: Negative for nausea and vomiting.  Genitourinary: Positive for difficulty urinating and urgency. Negative for dysuria, flank pain, vaginal bleeding and vaginal discharge.  All other systems reviewed and are negative.    Physical Exam Updated Vital Signs BP (!) 141/89 (BP Location: Right Arm)   Pulse 65   Temp 98.4 F (36.9 C) (Oral)   Resp 15   Wt 80.8 kg (178 lb 2 oz)   SpO2 100%   BMI 29.64 kg/m   Physical Exam  Constitutional: She appears well-developed and well-nourished. No distress.  HENT:  Head: Normocephalic and atraumatic.  Eyes:  Conjunctivae are normal.  Cardiovascular: Normal rate, regular rhythm, normal heart sounds and intact distal pulses.  Pulmonary/Chest: Effort normal and breath sounds normal.  Abdominal: Soft. Bowel sounds are normal. She exhibits no distension and no mass. There is tenderness (mild suprapubic tenderness, no distension noted). There is no rebound and no guarding.  No CVA tenderness  Neurological: She is alert.  Skin: Skin is warm.  Psychiatric: She has a normal mood and affect. Her behavior is normal.  Nursing note and vitals reviewed.    ED Treatments / Results  Labs (all labs ordered are listed, but only abnormal results are displayed) Labs Reviewed  URINALYSIS, ROUTINE W REFLEX MICROSCOPIC -  Abnormal; Notable for the following components:      Result Value   Color, Urine AMBER (*)    Ketones, ur 80 (*)    All other components within normal limits  COMPREHENSIVE METABOLIC PANEL - Abnormal; Notable for the following components:   Sodium 134 (*)    CO2 18 (*)    BUN 28 (*)    Total Bilirubin 1.3 (*)    All other components within normal limits  CBC WITH DIFFERENTIAL/PLATELET - Abnormal; Notable for the following components:   RBC 5.42 (*)    Hemoglobin 16.0 (*)    HCT 46.6 (*)    All other components within normal limits  URINE CULTURE    EKG  EKG Interpretation None       Radiology No results found.  Procedures Procedures (including critical care time)       Medications Ordered in ED Medications - No data to display   Initial Impression / Assessment and Plan / ED Course  I have reviewed the triage vital signs and the nursing notes.  Pertinent labs & imaging results that were available during my care of the patient were reviewed by me and considered in my medical decision making (see chart for details).     Presenting with 1 week of urinary retention.  Patient being followed by her PCP throughout this past week for the symptoms, which were questionably  from starting amitriptyline.  Patient has since stopped that medication, however they retention persists.  She was given bethanechol today by her PCP with hopes of relieving symptoms, however she persisted to have urinary retention. Also almost finished with keflex rx. Initial bladder scan showed 42 mL's of urine, however bedside ultrasound showing distended bladder.  Patient urinated with postvoid residual showing 350 mL's.  Patient urinated again in the ED with greater than 100 mL's shown on bladder scan.  At this time feel a Foley catheter placement will benefit patient until she can be seen outpatient by urology. No flank pain, fever, N/V. Remainder of workup reassuring.  Normal kidney function.  Normal white count.  Urine without infection.  Will Place Foley catheter in ED, with >200cc urine draining prior to discharge. Discharged with a leg bag and urology referral. Discussed importance of oral hydration.   Patient discussed with and seen by Dr. Sherry Ruffing.  Discussed results, findings, treatment and follow up. Patient advised of return precautions. Patient verbalized understanding and agreed with plan.  Final Clinical Impressions(s) / ED Diagnoses   Final diagnoses:  Acute urinary retention    ED Discharge Orders    None       Robinson, [place] N, PA-C 03/08/17 0002    Tegeler, Gwenyth Allegra, MD 03/09/17 [phone]

## 2017-03-07 NOTE — ED Notes (Signed)
ED Provider at bedside. 

## 2017-03-07 NOTE — Progress Notes (Signed)
Please call patient: I have reviewed his/her lab results. Urine culture did NOT show a bladder infection. If her symptoms were a side effect from the amitryptiline, then she should be feeling better.

## 2017-03-07 NOTE — Patient Instructions (Signed)
Follow up as needed or as scheduled Start the Bethanechol 3x/day as directed.  This can show improvement as quickly as 30 minutes but may take 90 minutes to work.  If no improvement, and you continue to feel distended, please go to the ER for evaluation We'll call you with your urology appt Take the Bethanechol on an empty stomach to avoid nausea/vomiting and please change positions slowly as this can cause dizziness Call with any questions or concerns Hang in there!!!

## 2017-03-07 NOTE — Discharge Instructions (Signed)
Please read instructions below. Finish your antibiotic as prescribed. Empty your catheter bag regularly. Schedule an appointment with the urologist to follow-up on your urinary retention.  Do not attempt to remove the catheter on your own.  If you have any problems with your catheter between now and urology follow-up, you can report back to the Er. Return to the ER for fever, flank pain, or new or concerning symptoms.

## 2017-03-07 NOTE — Progress Notes (Signed)
   Subjective:    Patient ID: Lorraine [name], female    DOB: [birth year], 66 y.o.   MRN: [id number]  HPI Bladder issues- pt saw Dr Jonni Sanger on Thursday.  Pt was started on Amitriptyline in November (taking a quarter of the pills).  1 week ago, woke up unable to void.  Stopped Amitriptyline 1 week ago and still not able to void 'more than a tablespoon'.  Took AZO w/o relief.  + hesitancy, frequency.  No burning w/ urination.  Urine from last week did not show infxn.  No low back pain.  + distended.   Review of Systems For ROS see HPI     Objective:   Physical Exam  Constitutional: She is oriented to person, place, and time. She appears well-developed and well-nourished.  Anxious and uncomfortable  HENT:  Head: Normocephalic and atraumatic.  Abdominal: Bowel sounds are normal. She exhibits distension (suprapubic distension w/ mild TTP). There is tenderness. There is no rebound and no guarding.  Neurological: She is alert and oriented to person, place, and time.  Skin: Skin is warm and dry.  Psychiatric: Her behavior is normal.  anxious  Vitals reviewed.         Assessment & Plan:  Urinary retention- reviewed w/ pt that her urine culture from last week was negative and her UA today was unremarkable.  Suspect this is an anticholinergic effect from her amitriptyline which pt discontinued 6 days ago.  Will add Bethanechol in attempts to improve bladder emptying.  I will put in referral to urology but cautioned her that with the holiday this won't be until later this week.  Instructed her that if the Bethanechol isn't effective within 2 hrs, she needs to go to ER.  Pt expressed understanding and is in agreement w/ plan.

## 2017-03-07 NOTE — ED Triage Notes (Addendum)
Patient here with complaints of difficulty urinating. Seen by primary care doctor with no relief. Symptoms started on Monday. States that her bladder is full.

## 2017-03-08 LAB — URINE CULTURE
MICRO NUMBER:: 81446090
SPECIMEN QUALITY:: ADEQUATE

## 2017-03-09 LAB — URINE CULTURE: Culture: NO GROWTH

## 2017-03-10 ENCOUNTER — Ambulatory Visit: Payer: Medicare Other | Admitting: Podiatry

## 2017-03-10 NOTE — Telephone Encounter (Signed)
So what went wrong? Of course have her in for another test.

## 2017-03-14 DIAGNOSIS — R338 Other retention of urine: Secondary | ICD-10-CM | POA: Diagnosis not present

## 2017-03-22 ENCOUNTER — Ambulatory Visit: Payer: Medicare Other | Admitting: Podiatry

## 2017-03-24 ENCOUNTER — Ambulatory Visit: Payer: Medicare Other | Admitting: Podiatry

## 2017-03-24 ENCOUNTER — Encounter: Payer: Self-pay | Admitting: Podiatry

## 2017-03-24 ENCOUNTER — Ambulatory Visit (INDEPENDENT_AMBULATORY_CARE_PROVIDER_SITE_OTHER): Payer: Medicare Other | Admitting: Podiatry

## 2017-03-24 DIAGNOSIS — M79672 Pain in left foot: Secondary | ICD-10-CM | POA: Diagnosis not present

## 2017-03-24 DIAGNOSIS — M79671 Pain in right foot: Secondary | ICD-10-CM | POA: Diagnosis not present

## 2017-03-24 DIAGNOSIS — G5793 Unspecified mononeuropathy of bilateral lower limbs: Secondary | ICD-10-CM

## 2017-03-24 NOTE — Progress Notes (Signed)
She presents today for repeat of her nerve biopsy.  She states that the nortriptyline and amitriptyline caused her to have a bladder problems where she had to actually be catheterized for a week.  So she states that she is not taking that any longer.  We have to resample the skin today because of a complication with pickup of the last nerve biopsies.  Objective: The areas were prepped today in the normal sterile fashion and local anesthetic was injected above the area of biopsy.  This area was prepped after local anesthetic to the fact and 2 punch biopsies were on the lateral fibula and the right fibula and was sent for pathologic evaluation for a epidermal nerve fiber testing.  Assessment neuropathy.  Plan: Epidermal nerve fiber testing is was taken today follow-up with her once that comes in about 2 weeks.

## 2017-04-06 ENCOUNTER — Telehealth: Payer: Self-pay | Admitting: *Deleted

## 2017-04-06 DIAGNOSIS — G629 Polyneuropathy, unspecified: Secondary | ICD-10-CM

## 2017-04-06 DIAGNOSIS — M792 Neuralgia and neuritis, unspecified: Secondary | ICD-10-CM

## 2017-04-06 NOTE — Telephone Encounter (Signed)
I spoke with Lorraine [name] states test is complete will fax to [phone].

## 2017-04-06 NOTE — Telephone Encounter (Signed)
Received [facility] Nerve biopsy results and S. Durant scanned into pt's chart. I informed pt the results were available and to keep her appt tomorrow.

## 2017-04-06 NOTE — Telephone Encounter (Signed)
Pt called for nerve biopsy results.

## 2017-04-07 ENCOUNTER — Ambulatory Visit (INDEPENDENT_AMBULATORY_CARE_PROVIDER_SITE_OTHER): Payer: Medicare Other | Admitting: Podiatry

## 2017-04-07 ENCOUNTER — Encounter: Payer: Self-pay | Admitting: Neurology

## 2017-04-07 ENCOUNTER — Encounter: Payer: Self-pay | Admitting: Podiatry

## 2017-04-07 DIAGNOSIS — G5793 Unspecified mononeuropathy of bilateral lower limbs: Secondary | ICD-10-CM | POA: Diagnosis not present

## 2017-04-07 DIAGNOSIS — M792 Neuralgia and neuritis, unspecified: Secondary | ICD-10-CM | POA: Diagnosis not present

## 2017-04-07 NOTE — Telephone Encounter (Signed)
-----   Message from Rip Harbour, [facility] sent at 04/07/2017  9:16 AM EST ----- Regarding: Neurology referral [facility] - consult - evaluate neuropathy/neuritis bilateral - nerve fiber testing negative

## 2017-04-07 NOTE — Progress Notes (Signed)
She presents today for follow-up of her nerve fiber testing.  She states that both of her feet still hurt but the right one in particular is very tender.  Objective: No change in neurovascular status.  Pulses are strongly palpable.  She still has pain on palpation to the first and third interdigital space of the right foot.  Pathology report does demonstrate a relatively normal nerve ending concentration.  It appears that she may have an idiopathic neuropathy.  Assessment: Neuromas with neuropathy.  Plan: Reinjected Kenalog and local anesthetic to the point of maximal tenderness today after sterile Betadine skin prep consisting of 20 mg of Kenalog 5 mg Marcaine point maximal tenderness.  Tolerated procedure well without comp occasions.  We are referring her to neurology for pain in her feet.

## 2017-04-07 NOTE — Telephone Encounter (Signed)
Faxed referral, clinicals and demographics to [facility]. 

## 2017-04-25 DIAGNOSIS — M545 Low back pain: Secondary | ICD-10-CM | POA: Diagnosis not present

## 2017-04-25 DIAGNOSIS — M48061 Spinal stenosis, lumbar region without neurogenic claudication: Secondary | ICD-10-CM | POA: Diagnosis not present

## 2017-04-27 DIAGNOSIS — M545 Low back pain: Secondary | ICD-10-CM | POA: Diagnosis not present

## 2017-04-27 DIAGNOSIS — M48061 Spinal stenosis, lumbar region without neurogenic claudication: Secondary | ICD-10-CM | POA: Diagnosis not present

## 2017-05-05 ENCOUNTER — Ambulatory Visit: Payer: Medicare Other | Admitting: Podiatry

## 2017-05-13 ENCOUNTER — Encounter: Payer: Self-pay | Admitting: Family Medicine

## 2017-05-13 MED ORDER — MELOXICAM 15 MG PO TABS: 15.0000 mg | ORAL_TABLET | Freq: Every day | ORAL | 3 refills | Status: DC

## 2017-05-13 MED ORDER — ZOLPIDEM TARTRATE 10 MG PO TABS: 10.0000 mg | ORAL_TABLET | Freq: Every day | ORAL | 3 refills | Status: DC

## 2017-05-13 NOTE — Telephone Encounter (Signed)
Last OV 03/07/17 Urinary frequency meloxicam last filled 01/20/17 #30 with 3 Zolpidem last filled 01/20/17 #30 with 3

## 2017-05-19 DIAGNOSIS — M48061 Spinal stenosis, lumbar region without neurogenic claudication: Secondary | ICD-10-CM | POA: Diagnosis not present

## 2017-05-19 DIAGNOSIS — M545 Low back pain: Secondary | ICD-10-CM | POA: Diagnosis not present

## 2017-05-27 DIAGNOSIS — Z1231 Encounter for screening mammogram for malignant neoplasm of breast: Secondary | ICD-10-CM | POA: Diagnosis not present

## 2017-05-27 DIAGNOSIS — H35363 Drusen (degenerative) of macula, bilateral: Secondary | ICD-10-CM | POA: Diagnosis not present

## 2017-05-27 LAB — HM MAMMOGRAPHY

## 2017-05-30 ENCOUNTER — Encounter: Payer: Self-pay | Admitting: Family Medicine

## 2017-06-10 DIAGNOSIS — M48061 Spinal stenosis, lumbar region without neurogenic claudication: Secondary | ICD-10-CM | POA: Diagnosis not present

## 2017-06-10 DIAGNOSIS — M545 Low back pain: Secondary | ICD-10-CM | POA: Diagnosis not present

## 2017-07-08 ENCOUNTER — Encounter

## 2017-07-08 ENCOUNTER — Encounter: Payer: Self-pay | Admitting: Neurology

## 2017-07-08 ENCOUNTER — Ambulatory Visit (INDEPENDENT_AMBULATORY_CARE_PROVIDER_SITE_OTHER): Payer: Medicare Other | Admitting: Neurology

## 2017-07-08 VITALS — BP 110/80 | HR 62 | Ht 64.5 in | Wt 181.5 lb

## 2017-07-08 DIAGNOSIS — M5416 Radiculopathy, lumbar region: Secondary | ICD-10-CM | POA: Diagnosis not present

## 2017-07-08 DIAGNOSIS — R011 Cardiac murmur, unspecified: Secondary | ICD-10-CM | POA: Diagnosis not present

## 2017-07-08 DIAGNOSIS — M792 Neuralgia and neuritis, unspecified: Secondary | ICD-10-CM

## 2017-07-08 NOTE — Progress Notes (Signed)
[facility] Clinic Note - Initial Visit   Date: 07/08/17  Lorraine [name] MRN: [id number] DOB: [birth year]   Dear Lorraine [name]:  Thank you for your kind referral of Lorraine [name] for consultation of bilateral feet pain. Although her history is well known to you, please allow [place] to reiterate it for the purpose of our medical record. The patient was accompanied to the clinic by self.    History of Present Illness: Lorraine [name] is a 68 y.o. right-handed Caucasian female with cervical fusion at C4-5 and C5-6 (2015), insomnia, bilateral plantar fasciitis s/p plantar fasciotomy, and bilateral Morton's neuroma s/p resection presenting for evaluation of bilateral feet pain.    She had right foot plantar fasciotomy in January 2018.  Following this, she developed burning pain over her entire right foot and by the fall of 2018, she has similar symptoms in the left foot.  Her pain feels like sharp prickly needles and affects her entire foot, and is particularly worse over the top of the foot.  Pain is constant and worse at night.  She denies any weakness or imbalance and walks unassisted.  Rest improves her pain and activity such as yard work or walking exacerbates her pain. Pain is usually ranked as 4-5/10.  She has been followed by Dr. Ron Agee for pain management and pver the past few months, she had two lumbar epidural injections which did relieve some of her feet pain.  She has tried amitriptyline 150mg /d (effective, but developed urinary retention), gabapentin 300mg  qhs (urinary urgency), Lyrica (discontinued due to bladder problems).  She is currently not taking any medications. She has tried OTC ointments.  Her prior evaluation has included skin biopsy for small fiber neuropathy which was normal.  Most recent MRI lumbar spine from 2017 shows bilateral L5 impingement due to foraminal stenosis.   She has no history of diabetes mellitus, thyroid disease, family history of  neuropathy, or heavy alcohol use.   Out-side paper records, electronic medical record, and images have been reviewed where available and most recent data is summarized as:  Skin biopsy 03/24/2017:  Intraepidermal nerve counts are normal  MRI cervical spine 01/28/2014:  Interval C4-C6 ACDF. No residual spinal stenosis. Mild to moderate residual neural foraminal narrowing as above, improved from prior.  MRI lumbar spine 07/04/2015: 1.  Focally advanced facet arthropathy at L4-5 with anterolisthesis and progressive canal stenosis.  The subarticular recesses are particularly stenotic with bilateral L5 impingement 2.  Progressive L2-3 degenerative disc disease without impingement 3.  Strain of the low right intrinsic back muscles.   Lab Results  Component Value Date   TSH 1.69 05/26/2016   No results found for: [id number] Lab Results  Component Value Date   ANA POS (A) 05/26/2016   No results found for: HGBA1C   Past Medical History:  Diagnosis Date  . Anxiety   . Chronic knee pain   . Chronic low back pain   . History of palpitations    2014-- low risk exercise tolerence test, no ischemi, PVC's (09-06-2012)  . Hx of colonic polyps   . Hyperlipidemia   . Insomnia   . Lactose intolerance   . Osteoarthritis    knees and lumbar  . Osteopenia   . Peripheral neuropathy    bilateral feet -- burning and stinging  . PONV (postoperative nausea and vomiting)    severe  . PVC's (premature ventricular contractions)   . Vaginal cyst   . Wears glasses  Past Surgical History:  Procedure Laterality Date  . ANTERIOR CERVICAL DECOMP/DISCECTOMY FUSION  01/2014   C4 -- C6  . CATARACT EXTRACTION W/ INTRAOCULAR LENS  IMPLANT, BILATERAL  2015  . ELBOW DEBRIDEMENT Right 09/10/2010   and tendon debridement and radial tunnel release  . EXCISION MORTON'S NEUROMA Bilateral left 02-20-2009/  right & left 05-08-2009  . EXCISION VAGINAL CYST N/A 07/01/2016   Procedure: EXCISION VAGINAL CYST;   Surgeon: Tyson Dense, MD;  Location: [facility];  Service: Gynecology;  Laterality: N/A;  . FOOT SURGERY Right 03/15/2016  . PLANTAR FASCIA RELEASE Bilateral left 04-02-2010/  right 04-02-2016  . TONSILLECTOMY AND ADENOIDECTOMY  child  . TOTAL ABDOMINAL HYSTERECTOMY  1974   w/  Left salpingoophorectomy and Partial right salpingoophorectomy     Medications:  Outpatient Encounter Medications as of 07/08/2017  Medication Sig  . B Complex Vitamins (B COMPLEX 100 PO) Take by mouth.  Marland Kitchen MAGNESIUM PO Take by mouth 3 (three) times a week.  . meloxicam (MOBIC) 15 MG tablet Take 1 tablet (15 mg total) by mouth daily.  Marland Kitchen POTASSIUM CHLORIDE PO Take 99 mg by mouth daily.   . pregabalin (LYRICA) 75 MG capsule Take 75 mg by mouth 2 (two) times daily.  Marland Kitchen Specialty Vitamins Products (LIPOTRIAD VISION SUPPORT PO) Take by mouth.  Marland Kitchen tiZANidine (ZANAFLEX) 4 MG tablet Take 1 tablet (4 mg total) by mouth every 6 (six) hours as needed for muscle spasms.  . valACYclovir (VALTREX) 1000 MG tablet Take 1,000 mg by mouth 2 (two) times daily.  . Vitamin D-Vitamin K (VITAMIN K2-VITAMIN D3) 45-2000 MCG-UNIT CAPS Take by mouth 3 (three) times a week.  . zolpidem (AMBIEN) 10 MG tablet Take 1 tablet (10 mg total) by mouth at bedtime.  . [DISCONTINUED] atorvastatin (LIPITOR) 40 MG tablet Take 1 tablet (40 mg total) by mouth every evening. (Patient not taking: Reported on 03/03/2017)  . [DISCONTINUED] bethanechol (URECHOLINE) 10 MG tablet Take 1 tablet (10 mg total) by mouth 3 (three) times daily.  . [DISCONTINUED] nortriptyline (PAMELOR) 10 MG capsule Take 1 capsule (10 mg total) by mouth at bedtime. (Patient not taking: Reported on 03/03/2017)   No facility-administered encounter medications on file as of 07/08/2017.      Allergies:  Allergies  Allergen Reactions  . Codeine Nausea And Vomiting  . Nortriptyline     Bladder retention     Family History: Family History  Problem Relation Age  of Onset  . Lung cancer Mother        lung  . Heart attack Father   . Memory loss Father   . Cancer Paternal Grandfather        colon  . Multiple sclerosis Brother     Social History: Social History   Tobacco Use  . Smoking status: Never Smoker  . Smokeless tobacco: Never Used  Substance Use Topics  . Alcohol use: No  . Drug use: No   Social History   Social History Narrative   Lives alone in a one story home.  Has one son.  Works as a Counsellor for [employer].  Education: some college.     Review of Systems:  CONSTITUTIONAL: No fevers, chills, night sweats, or weight loss.   EYES: No visual changes or eye pain ENT: No hearing changes.  No history of nose bleeds.   RESPIRATORY: No cough, wheezing and shortness of breath.   CARDIOVASCULAR: Negative for chest pain, and palpitations.   GI: Negative for  abdominal discomfort, blood in stools or black stools.  No recent change in bowel habits.   GU:  No history of incontinence.   MUSCLOSKELETAL: No history of joint pain or swelling.  No myalgias.   SKIN: Negative for lesions, rash, and itching.   HEMATOLOGY/ONCOLOGY: Negative for prolonged bleeding, bruising easily, and swollen nodes.  No history of cancer.   ENDOCRINE: Negative for cold or heat intolerance, polydipsia or goiter.   PSYCH:  No depression or anxiety symptoms.   NEURO: As Above.   Vital Signs:  BP 110/80   Pulse 62   Ht 5' 4.5" (1.638 m)   Wt 181 lb 8 oz (82.3 kg)   SpO2 96%   BMI 30.67 kg/m    General Medical Exam:   General:  Well appearing, comfortable.   Eyes/ENT: see cranial nerve examination.   Neck: No masses appreciated.  Full range of motion without tenderness.  No carotid bruits. Respiratory:  Clear to auscultation, good air entry bilaterally.   Cardiac:  Regular rate and rhythm, systolic murmur.   Extremities:  No deformities, edema, or skin discoloration.  Skin:  No rashes or lesions.  Neurological Exam: MENTAL STATUS  including orientation to time, place, person, recent and remote memory, attention span and concentration, language, and fund of knowledge is normal.  Speech is not dysarthric.  CRANIAL NERVES: II:  No visual field defects.  Unremarkable fundi.   III-IV-VI: Pupils equal round and reactive to light.  Normal conjugate, extra-ocular eye movements in all directions of gaze.  No nystagmus.  No ptosis.   V:  Normal facial sensation.   VII:  Normal facial symmetry and movements.   VIII:  Normal hearing and vestibular function.   IX-X:  Normal palatal movement.   XI:  Normal shoulder shrug and head rotation.   XII:  Normal tongue strength and range of motion, no deviation or fasciculation.  MOTOR:  No atrophy, fasciculations or abnormal movements.  No pronator drift.  Tone is normal.    Right Upper Extremity:    Left Upper Extremity:    Deltoid  5/5   Deltoid  5/5   Biceps  5/5   Biceps  5/5   Triceps  5/5   Triceps  5/5   Wrist extensors  5/5   Wrist extensors  5/5   Wrist flexors  5/5   Wrist flexors  5/5   Finger extensors  5/5   Finger extensors  5/5   Finger flexors  5/5   Finger flexors  5/5   Dorsal interossei  5/5   Dorsal interossei  5/5   Abductor pollicis  5/5   Abductor pollicis  5/5   Tone (Ashworth scale)  0  Tone (Ashworth scale)  0   Right Lower Extremity:    Left Lower Extremity:    Hip flexors  5/5   Hip flexors  5/5   Hip extensors  5/5   Hip extensors  5/5   Knee flexors  5/5   Knee flexors  5/5   Knee extensors  5/5   Knee extensors  5/5   Dorsiflexors  5/5   Dorsiflexors  5/5   Plantarflexors  5/5   Plantarflexors  5/5   Toe extensors  5/5   Toe extensors  5/5   Toe flexors  5/5   Toe flexors  5/5   Tone (Ashworth scale)  0  Tone (Ashworth scale)  0   MSRs:  Right  Left brachioradialis 2+  brachioradialis 2+  biceps 2+  biceps 2+  triceps 2+  triceps 2+  patellar 2+  patellar 2+  ankle jerk 2+   ankle jerk 2+  Hoffman no  Hoffman no  plantar response down  plantar response down   SENSORY:  Normal and symmetric perception of light touch, pinprick, vibration, and proprioception.  Romberg's sign absent.   COORDINATION/GAIT: Normal finger-to- nose-finger.  Intact rapid alternating movements bilaterally.    Gait narrow based and stable. Tandem and stressed gait intact.    IMPRESSION: Mrs. Koplin is a 67 year-old female referred for evaluation of bilateral feet pain.  Her neurological exam shows remarkably normal sensory exam, normal Achilles reflexes, and intact distal strength which makes large fiber neuropathy less likely.  I recommend NCS/EMG of the legs to better assess whether there is neuropathy or if symptoms are due to L5 radiculopathy.  Skin biopsy for small fiber neuropathy is normal.  Recommend lidocaine topical ointment to feet for pain relief, as she has many medication intolerances.    Systolic heart murmur is new and recommend that she follow-up with her cardiologist for this.    Further recommendations pending results.   Thank you for allowing me to participate in patient's care.  If I can answer any additional questions, I would be pleased to do so.    Sincerely,    Donika K. Posey Pronto, DO

## 2017-07-08 NOTE — Patient Instructions (Signed)
NCS/EMG of the legs  Follow-up with your cardiologist regarding for possible heart murmur

## 2017-07-14 ENCOUNTER — Encounter: Payer: Self-pay | Admitting: Family Medicine

## 2017-07-14 ENCOUNTER — Ambulatory Visit (INDEPENDENT_AMBULATORY_CARE_PROVIDER_SITE_OTHER): Payer: Medicare Other | Admitting: Family Medicine

## 2017-07-14 ENCOUNTER — Telehealth: Payer: Self-pay | Admitting: *Deleted

## 2017-07-14 ENCOUNTER — Other Ambulatory Visit: Payer: Self-pay

## 2017-07-14 ENCOUNTER — Ambulatory Visit (INDEPENDENT_AMBULATORY_CARE_PROVIDER_SITE_OTHER): Payer: Medicare Other | Admitting: Neurology

## 2017-07-14 VITALS — BP 120/80 | HR 63 | Temp 98.1°F | Resp 16 | Ht 65.0 in | Wt 179.1 lb

## 2017-07-14 DIAGNOSIS — E785 Hyperlipidemia, unspecified: Secondary | ICD-10-CM | POA: Diagnosis not present

## 2017-07-14 DIAGNOSIS — M792 Neuralgia and neuritis, unspecified: Secondary | ICD-10-CM | POA: Diagnosis not present

## 2017-07-14 DIAGNOSIS — M5417 Radiculopathy, lumbosacral region: Secondary | ICD-10-CM

## 2017-07-14 DIAGNOSIS — R011 Cardiac murmur, unspecified: Secondary | ICD-10-CM | POA: Insufficient documentation

## 2017-07-14 DIAGNOSIS — M5416 Radiculopathy, lumbar region: Secondary | ICD-10-CM

## 2017-07-14 HISTORY — DX: Radiculopathy, lumbar region: M54.16

## 2017-07-14 MED ORDER — BETHANECHOL CHLORIDE 10 MG PO TABS: 10.0000 mg | ORAL_TABLET | Freq: Three times a day (TID) | ORAL | 6 refills | Status: DC

## 2017-07-14 MED ORDER — VALACYCLOVIR HCL 1 G PO TABS: 1000.0000 mg | ORAL_TABLET | Freq: Every day | ORAL | 6 refills | Status: DC

## 2017-07-14 NOTE — Assessment & Plan Note (Signed)
New.  Pt has soft, systolic murmur over RUSB.  Asymptomatic.  Get ECHO to assess.  Will follow.

## 2017-07-14 NOTE — Patient Instructions (Signed)
Follow up in 6 months to recheck cholesterol We'll notify you of your lab results and make any changes if needed We'll call you with your ECHO appt to assess the heart murmur Continue to work on healthy diet and regular exercise to lower your cholesterol and improve your health Call with any questions or concerns Happy [place]!!!

## 2017-07-14 NOTE — Telephone Encounter (Signed)
Left message giving patient results and instructions.   

## 2017-07-14 NOTE — Procedures (Signed)
[facility]  [name]  [name], [name] [address] Tel: [phone] Fax:  [phone] Test Date:  07/14/2017  Patient: Lorraine [name] DOB: 1950/10/20 Physician: Narda Amber, DO  Sex: Female Height: 5\' 5"  Ref Phys: Narda Amber, DO  ID#: [id number] Temp: 36.1C Technician:    Patient Complaints: This is a 67 year old female referred for evaluation of bilateral feet pain and paresthesia.  NCV & EMG Findings: Extensive electrodiagnostic testing of the right lower extremity and additional studies of the left shows:  1. Bilateral sural and superficial peroneal sensory responses are within normal limits. 2. Bilateral peroneal and tibial motor responses are within normal limits. 3. Chronic motor axon loss changes are seen affecting the tibialis anterior and gluteus medius muscles, without accompanied active denervation.  Impression: 1. Chronic L5 radiculopathy affecting bilateral lower extremities, mild in degree electrically. 2. There is no evidence of a large fiber sensorimotor polyneuropathy affecting the lower extremities.   ___________________________ Narda Amber, DO    Nerve Conduction Studies Anti Sensory Summary Table   Site NR Peak (ms) Norm Peak (ms) P-T Amp (V) Norm P-T Amp  Left Sup Peroneal Anti Sensory (Ant Lat Mall)  36.1C  12 cm    1.8 <4.6 27.1 >3  Right Sup Peroneal Anti Sensory (Ant Lat Mall)  36.1C  12 cm    1.9 <4.6 19.1 >3  Left Sural Anti Sensory (Lat Mall)  36.1C  Calf    3.7 <4.6 10.1 >3  Right Sural Anti Sensory (Lat Mall)  36.1C  Calf    3.7 <4.6 11.2 >3   Motor Summary Table   Site NR Onset (ms) Norm Onset (ms) O-P Amp (mV) Norm O-P Amp Site1 Site2 Delta-0 (ms) Dist (cm) Vel (m/s) Norm Vel (m/s)  Left Peroneal Motor (Ext Dig Brev)  36.1C  Ankle    3.4 <6.0 2.6 >2.5 B Fib Ankle 7.0 35.0 50 >40  B Fib    10.4  2.5  Poplt B Fib 1.5 7.0 47 >40  Poplt    11.9  2.4         Right Peroneal Motor (Ext Dig Brev)  36.1C    Ankle    2.9 <6.0 2.7 >2.5 B Fib Ankle 7.4 37.0 50 >40  B Fib    10.3  2.3  Poplt B Fib 1.9 9.0 47 >40  Poplt    12.2  2.2         Left Tibial Motor (Abd Hall Brev)  36.1C  Ankle    5.0 <6.0 14.6 >4 Knee Ankle 6.7 41.0 61 >40  Knee    11.7  9.0         Right Tibial Motor (Abd Hall Brev)  36.1C  Ankle    3.1 <6.0 13.5 >4 Knee Ankle 9.0 39.0 43 >40  Knee    12.1  9.7          H Reflex Studies   NR H-Lat (ms) Lat Norm (ms) L-R H-Lat (ms)  Left Tibial (Gastroc)  36.1C     31.43 <35 0.00  Right Tibial (Gastroc)  36.1C     31.43 <35 0.00   EMG   Side Muscle Ins Act Fibs Psw Fasc Number Recrt Dur Dur. Amp Amp. Poly Poly. Comment  Right AntTibialis Nml Nml Nml Nml 1- Rapid Some 1+ Some 1+ Nml Nml N/A  Right Gastroc Nml Nml Nml Nml Nml Nml Nml Nml Nml Nml Nml Nml N/A  Right Flex Dig Long Nml Nml Nml  Nml Nml Nml Nml Nml Nml Nml Nml Nml N/A  Right RectFemoris Nml Nml Nml Nml Nml Nml Nml Nml Nml Nml Nml Nml N/A  Right LumbarPSP Nml Nml Nml Nml Nml Nml Nml Nml Nml Nml Nml Nml N/A  Right GluteusMed Nml Nml Nml Nml 1- Rapid Some 1+ Some 1+ Nml Nml N/A  Left AntTibialis Nml Nml Nml Nml 1- Rapid Some 1+ Some 1+ Nml Nml N/A  Left Gastroc Nml Nml Nml Nml Nml Nml Nml Nml Nml Nml Nml Nml N/A  Left Flex Dig Long Nml Nml Nml Nml Nml Nml Nml Nml Nml Nml Nml Nml N/A  Left RectFemoris Nml Nml Nml Nml Nml Nml Nml Nml Nml Nml Nml Nml N/A  Left GluteusMed Nml Nml Nml Nml 1- Rapid Some 1+ Some 1+ Nml Nml N/A      Waveforms:

## 2017-07-14 NOTE — Assessment & Plan Note (Signed)
Chronic problem.  Intolerant of statins in the past.  Check labs to risk stratify.  Encouraged healthy diet and regular exercise.  Will follow.

## 2017-07-14 NOTE — Progress Notes (Signed)
   Subjective:    Patient ID: Lorraine [name], female    DOB: 02/06/1951, 67 y.o.   MRN: [id number]  HPI ? Murmur- pt reports that Dr Posey Pronto thought she heard a murmur in the office last week.  Pt has no hx of murmur.  Has hx of irregular HR- saw Dr Debara Pickett in 2013 and normal stress test at that time and has not required f/u.  No CP, SOB, edema.  No family hx of valvular heart disease or CHF.   Review of Systems For ROS see HPI     Objective:   Physical Exam  Constitutional: She is oriented to person, place, and time. She appears well-developed and well-nourished. No distress.  HENT:  Head: Normocephalic and atraumatic.  Eyes: Pupils are equal, round, and reactive to light. Conjunctivae and EOM are normal.  Neck: Normal range of motion. Neck supple. No thyromegaly present.  Cardiovascular: Normal rate, regular rhythm and intact distal pulses.  Murmur (I-II/VI SEM at RUSB) heard. Pulmonary/Chest: Effort normal and breath sounds normal. No respiratory distress.  Abdominal: Soft. She exhibits no distension. There is no tenderness.  Musculoskeletal: She exhibits no edema.  Lymphadenopathy:    She has no cervical adenopathy.  Neurological: She is alert and oriented to person, place, and time.  Skin: Skin is warm and dry.  Psychiatric: She has a normal mood and affect. Her behavior is normal.  Vitals reviewed.         Assessment & Plan:

## 2017-07-14 NOTE — Telephone Encounter (Signed)
-----   Message from Alda Berthold, DO sent at 07/14/2017  3:56 PM EDT ----- Please inform patient that her nerve testing shows pinched nerve in the back causing her feet discomfort, there is no neuropathy in the feet.  Recommend f/u with Dr. Ron Agee for management of her back. She requested telephone call for the results. Thanks.

## 2017-07-18 ENCOUNTER — Ambulatory Visit (HOSPITAL_COMMUNITY): Payer: Medicare Other | Attending: Cardiovascular Disease

## 2017-07-18 ENCOUNTER — Other Ambulatory Visit (INDEPENDENT_AMBULATORY_CARE_PROVIDER_SITE_OTHER): Payer: Medicare Other

## 2017-07-18 ENCOUNTER — Other Ambulatory Visit: Payer: Medicare Other

## 2017-07-18 ENCOUNTER — Other Ambulatory Visit: Payer: Self-pay

## 2017-07-18 DIAGNOSIS — R011 Cardiac murmur, unspecified: Secondary | ICD-10-CM

## 2017-07-18 DIAGNOSIS — I082 Rheumatic disorders of both aortic and tricuspid valves: Secondary | ICD-10-CM | POA: Diagnosis not present

## 2017-07-18 DIAGNOSIS — E785 Hyperlipidemia, unspecified: Secondary | ICD-10-CM | POA: Insufficient documentation

## 2017-07-18 DIAGNOSIS — M545 Low back pain: Secondary | ICD-10-CM | POA: Diagnosis not present

## 2017-07-18 DIAGNOSIS — M48061 Spinal stenosis, lumbar region without neurogenic claudication: Secondary | ICD-10-CM | POA: Diagnosis not present

## 2017-07-18 DIAGNOSIS — I351 Nonrheumatic aortic (valve) insufficiency: Secondary | ICD-10-CM

## 2017-07-18 HISTORY — DX: Nonrheumatic aortic (valve) insufficiency: I35.1

## 2017-07-18 LAB — BASIC METABOLIC PANEL
BUN: 23 mg/dL (ref 6–23)
CO2: 25 mEq/L (ref 19–32)
Calcium: 9.8 mg/dL (ref 8.4–10.5)
Chloride: 107 mEq/L (ref 96–112)
Creatinine, Ser: 0.7 mg/dL (ref 0.40–1.20)
GFR: 88.7 mL/min (ref >60.00)
Glucose, Bld: 92 mg/dL (ref 70–99)
Potassium: 4.1 mEq/L (ref 3.5–5.1)
Sodium: 141 mEq/L (ref 135–145)

## 2017-07-18 LAB — HEPATIC FUNCTION PANEL
ALK PHOS: 87 U/L (ref 39–117)
ALT: 23 U/L (ref 0–35)
AST: 20 U/L (ref 0–37)
Albumin: 4.5 g/dL (ref 3.5–5.2)
BILIRUBIN DIRECT: 0.1 mg/dL (ref 0.0–0.3)
BILIRUBIN TOTAL: 0.6 mg/dL (ref 0.2–1.2)
Total Protein: 7.2 g/dL (ref 6.0–8.3)

## 2017-07-18 LAB — LIPID PANEL
CHOL/HDL RATIO: 4
Cholesterol: 244 mg/dL — ABNORMAL HIGH (ref 0–200)
HDL: 56 mg/dL (ref >39.00)
LDL CALC: 177 mg/dL — AB (ref 0–99)
NonHDL: 187.78
TRIGLYCERIDES: 56 mg/dL (ref 0.0–149.0)
VLDL: 11.2 mg/dL (ref 0.0–40.0)

## 2017-07-18 LAB — TSH: TSH: 1.53 u[IU]/mL (ref 0.35–4.50)

## 2017-07-19 ENCOUNTER — Other Ambulatory Visit: Payer: Self-pay | Admitting: General Practice

## 2017-07-19 DIAGNOSIS — M5416 Radiculopathy, lumbar region: Secondary | ICD-10-CM | POA: Diagnosis not present

## 2017-07-19 DIAGNOSIS — M4807 Spinal stenosis, lumbosacral region: Secondary | ICD-10-CM | POA: Diagnosis not present

## 2017-07-19 MED ORDER — EZETIMIBE 10 MG PO TABS: 10.0000 mg | ORAL_TABLET | Freq: Every day | ORAL | 6 refills | Status: DC

## 2017-07-22 DIAGNOSIS — R350 Frequency of micturition: Secondary | ICD-10-CM | POA: Diagnosis not present

## 2017-07-22 DIAGNOSIS — R3915 Urgency of urination: Secondary | ICD-10-CM | POA: Diagnosis not present

## 2017-07-22 DIAGNOSIS — M545 Low back pain: Secondary | ICD-10-CM | POA: Diagnosis not present

## 2017-07-27 DIAGNOSIS — M4807 Spinal stenosis, lumbosacral region: Secondary | ICD-10-CM | POA: Diagnosis not present

## 2017-07-27 DIAGNOSIS — M79672 Pain in left foot: Secondary | ICD-10-CM | POA: Diagnosis not present

## 2017-07-28 DIAGNOSIS — M9903 Segmental and somatic dysfunction of lumbar region: Secondary | ICD-10-CM | POA: Diagnosis not present

## 2017-07-28 DIAGNOSIS — M5136 Other intervertebral disc degeneration, lumbar region: Secondary | ICD-10-CM | POA: Diagnosis not present

## 2017-08-01 DIAGNOSIS — M5136 Other intervertebral disc degeneration, lumbar region: Secondary | ICD-10-CM | POA: Diagnosis not present

## 2017-08-01 DIAGNOSIS — M9903 Segmental and somatic dysfunction of lumbar region: Secondary | ICD-10-CM | POA: Diagnosis not present

## 2017-08-02 DIAGNOSIS — M4807 Spinal stenosis, lumbosacral region: Secondary | ICD-10-CM | POA: Diagnosis not present

## 2017-08-02 DIAGNOSIS — M5416 Radiculopathy, lumbar region: Secondary | ICD-10-CM | POA: Diagnosis not present

## 2017-08-02 DIAGNOSIS — M9903 Segmental and somatic dysfunction of lumbar region: Secondary | ICD-10-CM | POA: Diagnosis not present

## 2017-08-02 DIAGNOSIS — M5136 Other intervertebral disc degeneration, lumbar region: Secondary | ICD-10-CM | POA: Diagnosis not present

## 2017-08-03 DIAGNOSIS — R350 Frequency of micturition: Secondary | ICD-10-CM | POA: Diagnosis not present

## 2017-08-03 DIAGNOSIS — R3982 Chronic bladder pain: Secondary | ICD-10-CM | POA: Diagnosis not present

## 2017-08-04 DIAGNOSIS — M5136 Other intervertebral disc degeneration, lumbar region: Secondary | ICD-10-CM | POA: Diagnosis not present

## 2017-08-04 DIAGNOSIS — M9903 Segmental and somatic dysfunction of lumbar region: Secondary | ICD-10-CM | POA: Diagnosis not present

## 2017-08-05 ENCOUNTER — Other Ambulatory Visit: Payer: Self-pay | Admitting: General Practice

## 2017-08-05 ENCOUNTER — Encounter: Payer: Self-pay | Admitting: Family Medicine

## 2017-08-05 DIAGNOSIS — R3982 Chronic bladder pain: Secondary | ICD-10-CM | POA: Diagnosis not present

## 2017-08-05 MED ORDER — TIZANIDINE HCL 4 MG PO TABS: 4.0000 mg | ORAL_TABLET | Freq: Four times a day (QID) | ORAL | 0 refills | Status: DC | PRN

## 2017-08-09 DIAGNOSIS — R350 Frequency of micturition: Secondary | ICD-10-CM | POA: Diagnosis not present

## 2017-08-10 DIAGNOSIS — M5136 Other intervertebral disc degeneration, lumbar region: Secondary | ICD-10-CM | POA: Diagnosis not present

## 2017-08-10 DIAGNOSIS — M9903 Segmental and somatic dysfunction of lumbar region: Secondary | ICD-10-CM | POA: Diagnosis not present

## 2017-08-11 DIAGNOSIS — M6281 Muscle weakness (generalized): Secondary | ICD-10-CM | POA: Diagnosis not present

## 2017-08-11 DIAGNOSIS — R350 Frequency of micturition: Secondary | ICD-10-CM | POA: Diagnosis not present

## 2017-08-11 DIAGNOSIS — M62838 Other muscle spasm: Secondary | ICD-10-CM | POA: Diagnosis not present

## 2017-08-11 DIAGNOSIS — M4807 Spinal stenosis, lumbosacral region: Secondary | ICD-10-CM | POA: Diagnosis not present

## 2017-08-11 DIAGNOSIS — M545 Low back pain: Secondary | ICD-10-CM | POA: Diagnosis not present

## 2017-08-11 DIAGNOSIS — R3982 Chronic bladder pain: Secondary | ICD-10-CM | POA: Diagnosis not present

## 2017-08-11 DIAGNOSIS — M5416 Radiculopathy, lumbar region: Secondary | ICD-10-CM | POA: Diagnosis not present

## 2017-08-11 DIAGNOSIS — R3915 Urgency of urination: Secondary | ICD-10-CM | POA: Diagnosis not present

## 2017-08-15 DIAGNOSIS — M9903 Segmental and somatic dysfunction of lumbar region: Secondary | ICD-10-CM | POA: Diagnosis not present

## 2017-08-15 DIAGNOSIS — M5136 Other intervertebral disc degeneration, lumbar region: Secondary | ICD-10-CM | POA: Diagnosis not present

## 2017-08-16 DIAGNOSIS — M5136 Other intervertebral disc degeneration, lumbar region: Secondary | ICD-10-CM | POA: Diagnosis not present

## 2017-08-16 DIAGNOSIS — M9903 Segmental and somatic dysfunction of lumbar region: Secondary | ICD-10-CM | POA: Diagnosis not present

## 2017-08-19 DIAGNOSIS — M6281 Muscle weakness (generalized): Secondary | ICD-10-CM | POA: Diagnosis not present

## 2017-08-19 DIAGNOSIS — M62838 Other muscle spasm: Secondary | ICD-10-CM | POA: Diagnosis not present

## 2017-08-19 DIAGNOSIS — R350 Frequency of micturition: Secondary | ICD-10-CM | POA: Diagnosis not present

## 2017-08-19 DIAGNOSIS — R3915 Urgency of urination: Secondary | ICD-10-CM | POA: Diagnosis not present

## 2017-08-19 DIAGNOSIS — R3982 Chronic bladder pain: Secondary | ICD-10-CM | POA: Diagnosis not present

## 2017-08-24 DIAGNOSIS — M5136 Other intervertebral disc degeneration, lumbar region: Secondary | ICD-10-CM | POA: Diagnosis not present

## 2017-08-24 DIAGNOSIS — M9903 Segmental and somatic dysfunction of lumbar region: Secondary | ICD-10-CM | POA: Diagnosis not present

## 2017-08-24 DIAGNOSIS — R350 Frequency of micturition: Secondary | ICD-10-CM | POA: Diagnosis not present

## 2017-08-25 DIAGNOSIS — M62838 Other muscle spasm: Secondary | ICD-10-CM | POA: Diagnosis not present

## 2017-08-25 DIAGNOSIS — M6281 Muscle weakness (generalized): Secondary | ICD-10-CM | POA: Diagnosis not present

## 2017-08-25 DIAGNOSIS — R3982 Chronic bladder pain: Secondary | ICD-10-CM | POA: Diagnosis not present

## 2017-08-25 DIAGNOSIS — M9903 Segmental and somatic dysfunction of lumbar region: Secondary | ICD-10-CM | POA: Diagnosis not present

## 2017-08-25 DIAGNOSIS — R350 Frequency of micturition: Secondary | ICD-10-CM | POA: Diagnosis not present

## 2017-08-25 DIAGNOSIS — M5136 Other intervertebral disc degeneration, lumbar region: Secondary | ICD-10-CM | POA: Diagnosis not present

## 2017-08-25 DIAGNOSIS — R3915 Urgency of urination: Secondary | ICD-10-CM | POA: Diagnosis not present

## 2017-09-07 DIAGNOSIS — R3982 Chronic bladder pain: Secondary | ICD-10-CM | POA: Diagnosis not present

## 2017-09-07 DIAGNOSIS — M6281 Muscle weakness (generalized): Secondary | ICD-10-CM | POA: Diagnosis not present

## 2017-09-07 DIAGNOSIS — R350 Frequency of micturition: Secondary | ICD-10-CM | POA: Diagnosis not present

## 2017-09-07 DIAGNOSIS — M62838 Other muscle spasm: Secondary | ICD-10-CM | POA: Diagnosis not present

## 2017-09-07 DIAGNOSIS — R3915 Urgency of urination: Secondary | ICD-10-CM | POA: Diagnosis not present

## 2017-09-07 DIAGNOSIS — M6289 Other specified disorders of muscle: Secondary | ICD-10-CM | POA: Diagnosis not present

## 2017-09-08 ENCOUNTER — Encounter: Payer: Self-pay | Admitting: Family Medicine

## 2017-09-08 DIAGNOSIS — M9903 Segmental and somatic dysfunction of lumbar region: Secondary | ICD-10-CM | POA: Diagnosis not present

## 2017-09-08 DIAGNOSIS — M79671 Pain in right foot: Secondary | ICD-10-CM | POA: Diagnosis not present

## 2017-09-08 DIAGNOSIS — M79672 Pain in left foot: Secondary | ICD-10-CM | POA: Diagnosis not present

## 2017-09-08 DIAGNOSIS — M4807 Spinal stenosis, lumbosacral region: Secondary | ICD-10-CM | POA: Diagnosis not present

## 2017-09-08 DIAGNOSIS — M5136 Other intervertebral disc degeneration, lumbar region: Secondary | ICD-10-CM | POA: Diagnosis not present

## 2017-09-08 MED ORDER — MELOXICAM 15 MG PO TABS: 15.0000 mg | ORAL_TABLET | Freq: Every day | ORAL | 3 refills | Status: DC

## 2017-09-08 MED ORDER — ZOLPIDEM TARTRATE 10 MG PO TABS: 10.0000 mg | ORAL_TABLET | Freq: Every day | ORAL | 3 refills | Status: DC

## 2017-09-08 NOTE — Telephone Encounter (Signed)
Last OV 07/14/17 Zolpidem last filled 05/13/17 #30 with 3 meloxicam 05/13/17 #30 with 3

## 2017-09-12 ENCOUNTER — Encounter: Payer: Self-pay | Admitting: Family Medicine

## 2017-09-13 DIAGNOSIS — M25572 Pain in left ankle and joints of left foot: Secondary | ICD-10-CM | POA: Diagnosis not present

## 2017-09-13 DIAGNOSIS — M25571 Pain in right ankle and joints of right foot: Secondary | ICD-10-CM | POA: Diagnosis not present

## 2017-09-13 DIAGNOSIS — M9903 Segmental and somatic dysfunction of lumbar region: Secondary | ICD-10-CM | POA: Diagnosis not present

## 2017-09-13 DIAGNOSIS — M5136 Other intervertebral disc degeneration, lumbar region: Secondary | ICD-10-CM | POA: Diagnosis not present

## 2017-09-13 DIAGNOSIS — Z6830 Body mass index (BMI) 30.0-30.9, adult: Secondary | ICD-10-CM | POA: Diagnosis not present

## 2017-09-14 DIAGNOSIS — R3982 Chronic bladder pain: Secondary | ICD-10-CM | POA: Diagnosis not present

## 2017-09-14 DIAGNOSIS — R350 Frequency of micturition: Secondary | ICD-10-CM | POA: Diagnosis not present

## 2017-09-14 DIAGNOSIS — M6281 Muscle weakness (generalized): Secondary | ICD-10-CM | POA: Diagnosis not present

## 2017-09-14 DIAGNOSIS — M62838 Other muscle spasm: Secondary | ICD-10-CM | POA: Diagnosis not present

## 2017-09-14 DIAGNOSIS — R102 Pelvic and perineal pain: Secondary | ICD-10-CM | POA: Diagnosis not present

## 2017-09-14 DIAGNOSIS — M6289 Other specified disorders of muscle: Secondary | ICD-10-CM | POA: Diagnosis not present

## 2017-09-21 DIAGNOSIS — R102 Pelvic and perineal pain: Secondary | ICD-10-CM | POA: Diagnosis not present

## 2017-09-21 DIAGNOSIS — R3982 Chronic bladder pain: Secondary | ICD-10-CM | POA: Diagnosis not present

## 2017-09-21 DIAGNOSIS — R350 Frequency of micturition: Secondary | ICD-10-CM | POA: Diagnosis not present

## 2017-09-21 DIAGNOSIS — M6289 Other specified disorders of muscle: Secondary | ICD-10-CM | POA: Diagnosis not present

## 2017-09-21 DIAGNOSIS — M9903 Segmental and somatic dysfunction of lumbar region: Secondary | ICD-10-CM | POA: Diagnosis not present

## 2017-09-21 DIAGNOSIS — M6281 Muscle weakness (generalized): Secondary | ICD-10-CM | POA: Diagnosis not present

## 2017-09-21 DIAGNOSIS — M62838 Other muscle spasm: Secondary | ICD-10-CM | POA: Diagnosis not present

## 2017-09-21 DIAGNOSIS — M5136 Other intervertebral disc degeneration, lumbar region: Secondary | ICD-10-CM | POA: Diagnosis not present

## 2017-09-23 DIAGNOSIS — R102 Pelvic and perineal pain: Secondary | ICD-10-CM | POA: Diagnosis not present

## 2017-09-23 DIAGNOSIS — R3915 Urgency of urination: Secondary | ICD-10-CM | POA: Diagnosis not present

## 2017-09-23 DIAGNOSIS — R3982 Chronic bladder pain: Secondary | ICD-10-CM | POA: Diagnosis not present

## 2017-09-23 DIAGNOSIS — R350 Frequency of micturition: Secondary | ICD-10-CM | POA: Diagnosis not present

## 2017-09-26 DIAGNOSIS — M9903 Segmental and somatic dysfunction of lumbar region: Secondary | ICD-10-CM | POA: Diagnosis not present

## 2017-09-26 DIAGNOSIS — R3915 Urgency of urination: Secondary | ICD-10-CM | POA: Diagnosis not present

## 2017-09-26 DIAGNOSIS — R102 Pelvic and perineal pain: Secondary | ICD-10-CM | POA: Diagnosis not present

## 2017-09-26 DIAGNOSIS — M62838 Other muscle spasm: Secondary | ICD-10-CM | POA: Diagnosis not present

## 2017-09-26 DIAGNOSIS — M6289 Other specified disorders of muscle: Secondary | ICD-10-CM | POA: Diagnosis not present

## 2017-09-26 DIAGNOSIS — R3982 Chronic bladder pain: Secondary | ICD-10-CM | POA: Diagnosis not present

## 2017-09-26 DIAGNOSIS — M6281 Muscle weakness (generalized): Secondary | ICD-10-CM | POA: Diagnosis not present

## 2017-09-26 DIAGNOSIS — M5136 Other intervertebral disc degeneration, lumbar region: Secondary | ICD-10-CM | POA: Diagnosis not present

## 2017-09-30 DIAGNOSIS — R102 Pelvic and perineal pain: Secondary | ICD-10-CM | POA: Diagnosis not present

## 2017-10-05 DIAGNOSIS — R102 Pelvic and perineal pain: Secondary | ICD-10-CM | POA: Diagnosis not present

## 2017-10-05 DIAGNOSIS — M5136 Other intervertebral disc degeneration, lumbar region: Secondary | ICD-10-CM | POA: Diagnosis not present

## 2017-10-05 DIAGNOSIS — M9903 Segmental and somatic dysfunction of lumbar region: Secondary | ICD-10-CM | POA: Diagnosis not present

## 2017-10-06 DIAGNOSIS — R3915 Urgency of urination: Secondary | ICD-10-CM | POA: Diagnosis not present

## 2017-10-06 DIAGNOSIS — R102 Pelvic and perineal pain: Secondary | ICD-10-CM | POA: Diagnosis not present

## 2017-10-06 DIAGNOSIS — M6281 Muscle weakness (generalized): Secondary | ICD-10-CM | POA: Diagnosis not present

## 2017-10-06 DIAGNOSIS — M6289 Other specified disorders of muscle: Secondary | ICD-10-CM | POA: Diagnosis not present

## 2017-10-06 DIAGNOSIS — R3982 Chronic bladder pain: Secondary | ICD-10-CM | POA: Diagnosis not present

## 2017-10-06 DIAGNOSIS — M62838 Other muscle spasm: Secondary | ICD-10-CM | POA: Diagnosis not present

## 2017-10-10 DIAGNOSIS — R102 Pelvic and perineal pain: Secondary | ICD-10-CM | POA: Diagnosis not present

## 2017-10-11 DIAGNOSIS — M9903 Segmental and somatic dysfunction of lumbar region: Secondary | ICD-10-CM | POA: Diagnosis not present

## 2017-10-11 DIAGNOSIS — M5136 Other intervertebral disc degeneration, lumbar region: Secondary | ICD-10-CM | POA: Diagnosis not present

## 2017-10-19 DIAGNOSIS — M5136 Other intervertebral disc degeneration, lumbar region: Secondary | ICD-10-CM | POA: Diagnosis not present

## 2017-10-19 DIAGNOSIS — R338 Other retention of urine: Secondary | ICD-10-CM | POA: Diagnosis not present

## 2017-10-19 DIAGNOSIS — M9903 Segmental and somatic dysfunction of lumbar region: Secondary | ICD-10-CM | POA: Diagnosis not present

## 2017-10-20 DIAGNOSIS — M6289 Other specified disorders of muscle: Secondary | ICD-10-CM | POA: Diagnosis not present

## 2017-10-20 DIAGNOSIS — R3915 Urgency of urination: Secondary | ICD-10-CM | POA: Diagnosis not present

## 2017-10-20 DIAGNOSIS — R102 Pelvic and perineal pain: Secondary | ICD-10-CM | POA: Diagnosis not present

## 2017-10-20 DIAGNOSIS — M6281 Muscle weakness (generalized): Secondary | ICD-10-CM | POA: Diagnosis not present

## 2017-10-20 DIAGNOSIS — M62838 Other muscle spasm: Secondary | ICD-10-CM | POA: Diagnosis not present

## 2017-10-20 DIAGNOSIS — R3982 Chronic bladder pain: Secondary | ICD-10-CM | POA: Diagnosis not present

## 2017-10-24 DIAGNOSIS — R350 Frequency of micturition: Secondary | ICD-10-CM | POA: Diagnosis not present

## 2017-10-24 DIAGNOSIS — R102 Pelvic and perineal pain: Secondary | ICD-10-CM | POA: Diagnosis not present

## 2017-10-24 DIAGNOSIS — M9903 Segmental and somatic dysfunction of lumbar region: Secondary | ICD-10-CM | POA: Diagnosis not present

## 2017-10-24 DIAGNOSIS — M5136 Other intervertebral disc degeneration, lumbar region: Secondary | ICD-10-CM | POA: Diagnosis not present

## 2017-10-28 DIAGNOSIS — M6289 Other specified disorders of muscle: Secondary | ICD-10-CM | POA: Diagnosis not present

## 2017-10-28 DIAGNOSIS — R102 Pelvic and perineal pain: Secondary | ICD-10-CM | POA: Diagnosis not present

## 2017-10-28 DIAGNOSIS — M6281 Muscle weakness (generalized): Secondary | ICD-10-CM | POA: Diagnosis not present

## 2017-10-28 DIAGNOSIS — R3915 Urgency of urination: Secondary | ICD-10-CM | POA: Diagnosis not present

## 2017-10-28 DIAGNOSIS — M62838 Other muscle spasm: Secondary | ICD-10-CM | POA: Diagnosis not present

## 2017-10-28 DIAGNOSIS — R350 Frequency of micturition: Secondary | ICD-10-CM | POA: Diagnosis not present

## 2017-11-02 DIAGNOSIS — M9903 Segmental and somatic dysfunction of lumbar region: Secondary | ICD-10-CM | POA: Diagnosis not present

## 2017-11-02 DIAGNOSIS — R102 Pelvic and perineal pain: Secondary | ICD-10-CM | POA: Diagnosis not present

## 2017-11-02 DIAGNOSIS — M5136 Other intervertebral disc degeneration, lumbar region: Secondary | ICD-10-CM | POA: Diagnosis not present

## 2017-11-07 DIAGNOSIS — H35363 Drusen (degenerative) of macula, bilateral: Secondary | ICD-10-CM | POA: Diagnosis not present

## 2017-11-08 DIAGNOSIS — M9903 Segmental and somatic dysfunction of lumbar region: Secondary | ICD-10-CM | POA: Diagnosis not present

## 2017-11-08 DIAGNOSIS — M5136 Other intervertebral disc degeneration, lumbar region: Secondary | ICD-10-CM | POA: Diagnosis not present

## 2017-11-08 DIAGNOSIS — R102 Pelvic and perineal pain: Secondary | ICD-10-CM | POA: Diagnosis not present

## 2017-11-12 DIAGNOSIS — Z23 Encounter for immunization: Secondary | ICD-10-CM | POA: Diagnosis not present

## 2017-11-16 DIAGNOSIS — M9903 Segmental and somatic dysfunction of lumbar region: Secondary | ICD-10-CM | POA: Diagnosis not present

## 2017-11-16 DIAGNOSIS — M5136 Other intervertebral disc degeneration, lumbar region: Secondary | ICD-10-CM | POA: Diagnosis not present

## 2017-11-21 DIAGNOSIS — M5136 Other intervertebral disc degeneration, lumbar region: Secondary | ICD-10-CM | POA: Diagnosis not present

## 2017-11-21 DIAGNOSIS — M9903 Segmental and somatic dysfunction of lumbar region: Secondary | ICD-10-CM | POA: Diagnosis not present

## 2017-11-25 DIAGNOSIS — Z23 Encounter for immunization: Secondary | ICD-10-CM | POA: Diagnosis not present

## 2017-11-30 DIAGNOSIS — M5136 Other intervertebral disc degeneration, lumbar region: Secondary | ICD-10-CM | POA: Diagnosis not present

## 2017-11-30 DIAGNOSIS — M9903 Segmental and somatic dysfunction of lumbar region: Secondary | ICD-10-CM | POA: Diagnosis not present

## 2017-12-02 DIAGNOSIS — M7062 Trochanteric bursitis, left hip: Secondary | ICD-10-CM | POA: Diagnosis not present

## 2017-12-07 DIAGNOSIS — M9903 Segmental and somatic dysfunction of lumbar region: Secondary | ICD-10-CM | POA: Diagnosis not present

## 2017-12-07 DIAGNOSIS — M5136 Other intervertebral disc degeneration, lumbar region: Secondary | ICD-10-CM | POA: Diagnosis not present

## 2017-12-09 DIAGNOSIS — R102 Pelvic and perineal pain: Secondary | ICD-10-CM | POA: Diagnosis not present

## 2017-12-14 DIAGNOSIS — M5136 Other intervertebral disc degeneration, lumbar region: Secondary | ICD-10-CM | POA: Diagnosis not present

## 2017-12-14 DIAGNOSIS — M9903 Segmental and somatic dysfunction of lumbar region: Secondary | ICD-10-CM | POA: Diagnosis not present

## 2017-12-19 DIAGNOSIS — M5136 Other intervertebral disc degeneration, lumbar region: Secondary | ICD-10-CM | POA: Diagnosis not present

## 2017-12-19 DIAGNOSIS — M9903 Segmental and somatic dysfunction of lumbar region: Secondary | ICD-10-CM | POA: Diagnosis not present

## 2017-12-20 DIAGNOSIS — R102 Pelvic and perineal pain: Secondary | ICD-10-CM | POA: Diagnosis not present

## 2017-12-20 DIAGNOSIS — R3982 Chronic bladder pain: Secondary | ICD-10-CM | POA: Diagnosis not present

## 2017-12-20 DIAGNOSIS — R350 Frequency of micturition: Secondary | ICD-10-CM | POA: Diagnosis not present

## 2017-12-20 DIAGNOSIS — R338 Other retention of urine: Secondary | ICD-10-CM | POA: Diagnosis not present

## 2017-12-23 DIAGNOSIS — R102 Pelvic and perineal pain: Secondary | ICD-10-CM | POA: Diagnosis not present

## 2017-12-28 DIAGNOSIS — R102 Pelvic and perineal pain: Secondary | ICD-10-CM | POA: Diagnosis not present

## 2017-12-28 DIAGNOSIS — M5136 Other intervertebral disc degeneration, lumbar region: Secondary | ICD-10-CM | POA: Diagnosis not present

## 2017-12-28 DIAGNOSIS — M9903 Segmental and somatic dysfunction of lumbar region: Secondary | ICD-10-CM | POA: Diagnosis not present

## 2018-01-02 ENCOUNTER — Encounter: Payer: Self-pay | Admitting: Family Medicine

## 2018-01-02 DIAGNOSIS — R102 Pelvic and perineal pain: Secondary | ICD-10-CM | POA: Diagnosis not present

## 2018-01-02 NOTE — Telephone Encounter (Signed)
Last OV 07/14/17 Zolpidem last filled 09/08/17 #30 with 3 meloxicam last filled 09/08/17 #30 with 3

## 2018-01-03 MED ORDER — ZOLPIDEM TARTRATE 10 MG PO TABS: 10.0000 mg | ORAL_TABLET | Freq: Every day | ORAL | 3 refills | Status: DC

## 2018-01-03 MED ORDER — MELOXICAM 15 MG PO TABS: 15.0000 mg | ORAL_TABLET | Freq: Every day | ORAL | 3 refills | Status: DC

## 2018-01-04 DIAGNOSIS — M5136 Other intervertebral disc degeneration, lumbar region: Secondary | ICD-10-CM | POA: Diagnosis not present

## 2018-01-04 DIAGNOSIS — M9903 Segmental and somatic dysfunction of lumbar region: Secondary | ICD-10-CM | POA: Diagnosis not present

## 2018-01-11 DIAGNOSIS — R102 Pelvic and perineal pain: Secondary | ICD-10-CM | POA: Diagnosis not present

## 2018-01-11 DIAGNOSIS — M9903 Segmental and somatic dysfunction of lumbar region: Secondary | ICD-10-CM | POA: Diagnosis not present

## 2018-01-11 DIAGNOSIS — M5136 Other intervertebral disc degeneration, lumbar region: Secondary | ICD-10-CM | POA: Diagnosis not present

## 2018-01-17 DIAGNOSIS — R102 Pelvic and perineal pain: Secondary | ICD-10-CM | POA: Diagnosis not present

## 2018-01-17 DIAGNOSIS — M5136 Other intervertebral disc degeneration, lumbar region: Secondary | ICD-10-CM | POA: Diagnosis not present

## 2018-01-17 DIAGNOSIS — M9903 Segmental and somatic dysfunction of lumbar region: Secondary | ICD-10-CM | POA: Diagnosis not present

## 2018-01-25 DIAGNOSIS — M9903 Segmental and somatic dysfunction of lumbar region: Secondary | ICD-10-CM | POA: Diagnosis not present

## 2018-01-25 DIAGNOSIS — R102 Pelvic and perineal pain: Secondary | ICD-10-CM | POA: Diagnosis not present

## 2018-01-25 DIAGNOSIS — M5136 Other intervertebral disc degeneration, lumbar region: Secondary | ICD-10-CM | POA: Diagnosis not present

## 2018-01-25 DIAGNOSIS — R350 Frequency of micturition: Secondary | ICD-10-CM | POA: Diagnosis not present

## 2018-01-25 DIAGNOSIS — R3982 Chronic bladder pain: Secondary | ICD-10-CM | POA: Diagnosis not present

## 2018-01-31 DIAGNOSIS — M9903 Segmental and somatic dysfunction of lumbar region: Secondary | ICD-10-CM | POA: Diagnosis not present

## 2018-01-31 DIAGNOSIS — R102 Pelvic and perineal pain: Secondary | ICD-10-CM | POA: Diagnosis not present

## 2018-01-31 DIAGNOSIS — M5136 Other intervertebral disc degeneration, lumbar region: Secondary | ICD-10-CM | POA: Diagnosis not present

## 2018-02-03 DIAGNOSIS — N200 Calculus of kidney: Secondary | ICD-10-CM

## 2018-02-03 DIAGNOSIS — I700 Atherosclerosis of aorta: Secondary | ICD-10-CM

## 2018-02-03 DIAGNOSIS — R102 Pelvic and perineal pain: Secondary | ICD-10-CM | POA: Diagnosis not present

## 2018-02-03 HISTORY — DX: Calculus of kidney: N20.0

## 2018-02-03 HISTORY — DX: Atherosclerosis of aorta: I70.0

## 2018-02-08 DIAGNOSIS — R3982 Chronic bladder pain: Secondary | ICD-10-CM | POA: Diagnosis not present

## 2018-02-08 DIAGNOSIS — R102 Pelvic and perineal pain: Secondary | ICD-10-CM | POA: Diagnosis not present

## 2018-02-08 DIAGNOSIS — M5136 Other intervertebral disc degeneration, lumbar region: Secondary | ICD-10-CM | POA: Diagnosis not present

## 2018-02-08 DIAGNOSIS — M9903 Segmental and somatic dysfunction of lumbar region: Secondary | ICD-10-CM | POA: Diagnosis not present

## 2018-02-14 DIAGNOSIS — M9903 Segmental and somatic dysfunction of lumbar region: Secondary | ICD-10-CM | POA: Diagnosis not present

## 2018-02-14 DIAGNOSIS — R102 Pelvic and perineal pain: Secondary | ICD-10-CM | POA: Diagnosis not present

## 2018-02-14 DIAGNOSIS — M5136 Other intervertebral disc degeneration, lumbar region: Secondary | ICD-10-CM | POA: Diagnosis not present

## 2018-02-14 DIAGNOSIS — R350 Frequency of micturition: Secondary | ICD-10-CM | POA: Diagnosis not present

## 2018-02-22 DIAGNOSIS — M5136 Other intervertebral disc degeneration, lumbar region: Secondary | ICD-10-CM | POA: Diagnosis not present

## 2018-02-22 DIAGNOSIS — R350 Frequency of micturition: Secondary | ICD-10-CM | POA: Diagnosis not present

## 2018-02-22 DIAGNOSIS — R3915 Urgency of urination: Secondary | ICD-10-CM | POA: Diagnosis not present

## 2018-02-22 DIAGNOSIS — M9903 Segmental and somatic dysfunction of lumbar region: Secondary | ICD-10-CM | POA: Diagnosis not present

## 2018-02-23 DIAGNOSIS — R102 Pelvic and perineal pain: Secondary | ICD-10-CM | POA: Diagnosis not present

## 2018-02-27 DIAGNOSIS — M5136 Other intervertebral disc degeneration, lumbar region: Secondary | ICD-10-CM | POA: Diagnosis not present

## 2018-02-27 DIAGNOSIS — M9903 Segmental and somatic dysfunction of lumbar region: Secondary | ICD-10-CM | POA: Diagnosis not present

## 2018-03-03 DIAGNOSIS — R102 Pelvic and perineal pain: Secondary | ICD-10-CM | POA: Diagnosis not present

## 2018-03-07 DIAGNOSIS — R350 Frequency of micturition: Secondary | ICD-10-CM | POA: Diagnosis not present

## 2018-03-07 DIAGNOSIS — R3915 Urgency of urination: Secondary | ICD-10-CM | POA: Diagnosis not present

## 2018-03-09 ENCOUNTER — Other Ambulatory Visit: Payer: Self-pay | Admitting: Urology

## 2018-03-13 DIAGNOSIS — M5136 Other intervertebral disc degeneration, lumbar region: Secondary | ICD-10-CM | POA: Diagnosis not present

## 2018-03-13 DIAGNOSIS — M9903 Segmental and somatic dysfunction of lumbar region: Secondary | ICD-10-CM | POA: Diagnosis not present

## 2018-03-17 DIAGNOSIS — R102 Pelvic and perineal pain: Secondary | ICD-10-CM | POA: Diagnosis not present

## 2018-03-17 DIAGNOSIS — R3982 Chronic bladder pain: Secondary | ICD-10-CM | POA: Diagnosis not present

## 2018-03-22 DIAGNOSIS — M5136 Other intervertebral disc degeneration, lumbar region: Secondary | ICD-10-CM | POA: Diagnosis not present

## 2018-03-22 DIAGNOSIS — M9903 Segmental and somatic dysfunction of lumbar region: Secondary | ICD-10-CM | POA: Diagnosis not present

## 2018-03-27 DIAGNOSIS — M5136 Other intervertebral disc degeneration, lumbar region: Secondary | ICD-10-CM | POA: Diagnosis not present

## 2018-03-27 DIAGNOSIS — M9903 Segmental and somatic dysfunction of lumbar region: Secondary | ICD-10-CM | POA: Diagnosis not present

## 2018-04-05 DIAGNOSIS — M9903 Segmental and somatic dysfunction of lumbar region: Secondary | ICD-10-CM | POA: Diagnosis not present

## 2018-04-05 DIAGNOSIS — M5136 Other intervertebral disc degeneration, lumbar region: Secondary | ICD-10-CM | POA: Diagnosis not present

## 2018-04-06 ENCOUNTER — Encounter (HOSPITAL_BASED_OUTPATIENT_CLINIC_OR_DEPARTMENT_OTHER): Payer: Self-pay

## 2018-04-06 ENCOUNTER — Other Ambulatory Visit: Payer: Self-pay

## 2018-04-06 NOTE — Progress Notes (Signed)
Spoke with:  Lina NPO:  After Midnight, no gum, candy, or mints   Arrival time: [id number] Labs: N/A AM medications: None Pre op orders: Yes Ride home:  Birdie Sons (friend) [phone]

## 2018-04-07 DIAGNOSIS — R300 Dysuria: Secondary | ICD-10-CM | POA: Diagnosis not present

## 2018-04-07 DIAGNOSIS — R102 Pelvic and perineal pain: Secondary | ICD-10-CM | POA: Diagnosis not present

## 2018-04-07 DIAGNOSIS — R350 Frequency of micturition: Secondary | ICD-10-CM | POA: Diagnosis not present

## 2018-04-10 DIAGNOSIS — M9903 Segmental and somatic dysfunction of lumbar region: Secondary | ICD-10-CM | POA: Diagnosis not present

## 2018-04-10 DIAGNOSIS — M5136 Other intervertebral disc degeneration, lumbar region: Secondary | ICD-10-CM | POA: Diagnosis not present

## 2018-04-13 NOTE — H&P (Signed)
Office Visit Report     04/07/2018   --------------------------------------------------------------------------------   Lorraine [name]  MRN: [id number]  PRIMARY CARE:    DOB: 09-Mar-1951, 68 year old Female  REFERRING:  Georgette Dover, MD  SSN: -[id number]  PROVIDER:  [name], M.D.    TREATING:  Azucena Fallen    LOCATION:  [facility] [phone]   --------------------------------------------------------------------------------   CC/HPI: Patient with past history of urinary retention in 02/2017 following use of Amitriptyline (pvr ~ 200 ml). She said she'll never take amitriptyline again. Baseline voiding symptoms at that time included urinary frequency of about every 2 hours and nocturia 2-3 ("longterm overactive bladder" per pt -- years). She also had weak stream with Lyrica Feb 2018 and noted "pain in the bladder". Cymbalta caused same symptoms.   NG risk -- She was evaluated by neurology for pinched nerve in her back, causing pain in her feet. She has undergone multiple therapies for this pain- Lyrica, Gabapentin, and Cymbalta. However, she feels that all of these therapies exacerbate voiding symptoms and she has had to d/c these. Neurologist felt large fiber neuropathy less likely. She recommended NCS/EMG of the legs to better assess whether there is neuropathy or if symptoms are due to L5 radiculopathy. No past neurological history and states she has been evaluated for MS and this was not suspected. She is not diabetic.   She presented May 2019 with bothersome urgency, frequency and bother SP pressure. She complained of urinary frequency of every 20 minutes- 1 hour. She also noted urinary urgency, but denies any incontinence at this time. Noc x 2-3 which is chronic. No straining or weak stream. No dysuria or gross hematuria. She felt SP pressure and urgency. Worse with walking. No prolapse symptoms. No dysuria or gross hematuria. Laying down and heating pad helps.  Pelvic MRI nl in 2017. Kidneys nl on l-spine MRI May 2019. She began bladder instillations and underwent pelvic floor physical occupational therapy. She had issues with constipation but managed with when necessary MiraLAX. Restarted on transvaginal diazepam and this worked quite well.   Aug 2019 She continued physical therapy, transvaginal diazepam and instillations. She also takes Uribel as needed. Recall she has lifelong frequency and urgency but more bothersome SP pressure since February 2019. She is read a lot about IC and started the IC diet. Cystoscopy was normal.    She was seen 12/20/17 with ongoing suprapubic pressure, pelvic pain, and increased urinary frequency and urgency. She denies dysuria or gross hematuria. She had bladder instillations, but states that this was not as beneficial for her as it has been in the past. She has also been using Uribel on PRN basis. She states that vaginal valium has seemed to help the most in the past.   She was seen Nov 2019: She is currently using Hydroxyzine, Uribel, and intravaginal Diazepam on PRN basis. She saw PT and is doing PT exercises at home. SP pressure, freq and urge improved. Given she continued to have some complaints and did not appear to have axial imaging a CT scan of the abdomen and pelvis was obtained Feb 03, 2018. This was benign. There was a 3 mm LLP stone.   02/14/2018: She returns today and is stable. I reviewed her CT scan images. She is currently using Hydroxyzine, Uribel, and intravaginal Diazepam on PRN basis. Also, PT. She uses TV valium twice a week. We discussed HD. Before we do that, I'm going to set her up for UDS here  in the office.   03/07/2018: UDS revealed a low capacity ~200 ml bladder, hypersensitive with pressure at capacity, unstable with no leak, no stress component; she voided to completion with some EMG flare. She would like an instillation today.   04/07/18: She presents today with concerns for possible UTI. She is  scheduled for cysto with HOD on 1/31. She complains of dysuria which began about 1-2 days ago. She also complains of pelvic pressure, urgency, and frequency. She denies gross hematuria, fevers, or chills. No nausea or vomiting. She continues to use Uribel and intravaginal valium on PRN basis. She remains on Hydroxyzine.     ALLERGIES: Amitriptyline Codeine Myrbetriq - Other Reaction, bloating    MEDICATIONS: Diazepam 10 mg tablet 1 tablet Per Vagina Q HS PRN  Hydroxyzine Hcl 10 mg tablet 1 tablet PO Q HS  Meloxicam  Zolpidem Tartrate 10 mg tablet     GU PSH: Complex cystometrogram, w/ void pressure and urethral pressure profile studies, any technique - 02/22/2018 Complex Uroflow - 02/22/2018 Cystoscopy - 10/24/2017 Emg surf Electrd - 02/22/2018 Hysterectomy - 1974 Inject For cystogram - 02/22/2018 Intrabd voidng Press - 02/22/2018 Locm 300-399Mg /Ml Iodine,1Ml - 02/03/2018      PSH Notes: Oral 1982 & 1986, l foot mortan neuroma 2010 & 2011, r foot mortan neuroma 2001, r elbow 2012, cataracts bilateral eyes 2015, neck fusion c 4 5 6  2015, excision vaginal cysts 2018   NON-GU PSH: Bmi>=30Or<22 Cal No Followup - 08/11/2017 Doc Meds Verified W/Pt Or Re - 08/11/2017 Pain Doc Pos And Plan - 08/11/2017    GU PMH: Pelvic/perineal pain - 10/20/2017, - 10/06/2017, - 09/21/2017, - 09/14/2017 Suprapubic pain - 08/03/2017 Urinary Frequency - 07/22/2017 Urinary Urgency - 07/22/2017 Urinary Retention - 03/14/2017    NON-GU PMH: Muscle weakness (generalized) - 02/23/2018, - 11/03/2017, - 10/28/2017, - 10/20/2017, - 10/06/2017, - 09/21/2017, - 09/14/2017, - 09/07/2017, - 08/11/2017 Other muscle spasm - 02/23/2018, - 11/03/2017, - 10/20/2017, - 10/06/2017, - 09/21/2017, - 09/14/2017, - 09/07/2017, - 08/11/2017 Other specified disorders of muscle - 02/23/2018, - 11/03/2017, - 10/20/2017, - 10/06/2017, - 09/21/2017, - 09/14/2017, - 09/07/2017 Anxiety Arthritis Hypercholesterolemia    FAMILY HISTORY: 1 son - Son Death In The  Family Father - Father, Mother   SOCIAL HISTORY: Marital Status: Single Preferred Language: English; Ethnicity: Not Hispanic Or Latino; Race: White Current Smoking Status: Patient has never smoked.   Tobacco Use Assessment Completed: Used Tobacco in last 30 days? Has never drank.  Does not drink caffeine.    REVIEW OF SYSTEMS:    GU Review Female:   Patient reports burning /pain with urination. Patient denies frequent urination, hard to postpone urination, get up at night to urinate, leakage of urine, stream starts and stops, trouble starting your stream, have to strain to urinate, and being pregnant.  Gastrointestinal (Upper):   Patient denies nausea, vomiting, and indigestion/ heartburn.  Gastrointestinal (Lower):   Patient denies diarrhea and constipation.  Constitutional:   Patient denies fever, night sweats, weight loss, and fatigue.  Skin:   Patient denies skin rash/ lesion and itching.  Eyes:   Patient denies blurred vision and double vision.  Ears/ Nose/ Throat:   Patient denies sore throat and sinus problems.  Hematologic/Lymphatic:   Patient denies swollen glands and easy bruising.  Cardiovascular:   Patient denies leg swelling and chest pains.  Respiratory:   Patient denies cough and shortness of breath.  Endocrine:   Patient denies excessive thirst.  Musculoskeletal:   Patient reports joint pain.  Patient denies back pain.  Neurological:   Patient denies headaches and dizziness.  Psychologic:   Patient denies depression and anxiety.   VITAL SIGNS:      04/07/2018 01:32 PM  Weight 175 lb / 79.38 kg  Height 64.5 in / 163.83 cm  BP 137/83 mmHg  Heart Rate 67 /min  Temperature 98.1 F / 36.7 C  BMI 29.6 kg/m   MULTI-SYSTEM PHYSICAL EXAMINATION:    Constitutional: Well-nourished. No physical deformities. Normally developed. Good grooming.  Neurologic / Psychiatric: Oriented to time, oriented to place, oriented to person. No depression, no anxiety, no agitation.   Gastrointestinal: No mass, no tenderness, no rigidity, non obese abdomen. No CVAT.      PAST DATA REVIEWED:  Source Of History:  Patient  Records Review:   Previous Patient Records  Urine Test Review:   Urinalysis, Urine Culture  Urodynamics Review:   Review Bladder Scan, Review Urodynamics Tests   PROCEDURES:          Anesthetic Cocktail - 26948 Patient prepped and cleansed well. 11fr clear cath inserted without difficulty and bladder completely drained. 25cc of 0.5% bupivacaine was instilled into patient's bladder without difficulty. Pt tolerated well.          Urinalysis Dipstick Dipstick Cont'd  Color: Green Bilirubin: Neg mg/dL  Appearance: Clear Ketones: Neg mg/dL  Specific Gravity: 1.020 Blood: Neg ery/uL  pH: 5.5 Protein: Neg mg/dL  Glucose: Neg mg/dL Urobilinogen: 0.2 mg/dL    Nitrites: Neg    Leukocyte Esterase: Neg leu/uL    ASSESSMENT:      ICD-10 Details  1 GU:   Pelvic/perineal pain - R10.2   2   Urinary Frequency - R35.0   3   Dysuria - R30.0    PLAN:            Medications Refill Meds: Uribel 118 mg-10 mg-40.8 mg-36 mg-0.12 mg capsule 1 capsule PO TID PRN   #30  1 Refill(s)            Orders Labs Urine Culture          Schedule Procedure: 04/07/2018 at [facility] [phone] - Anesthetic Cocktail (Bladder Lavage Irrigation) - 51700          Document Letter(s):  Created for Patient: Clinical Summary         Notes:   Urinalysis today is without infectious parameters. I will send for urine culture. We discussed that current symptoms may represent flare. We discussed moving forward with bladder instillation and she would like to proceed. I encouraged that she continue to hydrate well and limit intake of known bladder irritants. She may continues to use Uribel on PRN basis. Refill printed today for her and a few samples provided. She will keep scheduled follow up for procedure. We will keep her informed of culture results. Return  precuations reviewed.         Next Appointment:      Next Appointment: 04/14/2018 08:45 AM    Appointment Type: Surgery     Location: [facility] [phone]    Provider: [name], M.D.    Reason for Visit: NE/OP CYSTOSCOPY, HYDRODISTENTION, PYRIDIUM/MARCAINE INSTILLATION      * Signed by Azucena Fallen on 04/07/18 at 2:21 PM (EST)*     The information contained in this medical record document is considered private and confidential patient information. This information can only be used for the medical diagnosis and/or medical services that are being provided  by the patient's selected caregivers. This information can only be distributed outside of the patient's care if the patient agrees and signs waivers of authorization for this information to be sent to an outside source or route.  I reviewed pt's chart and labs. I discussed with NP Ronal Fear and agree with her assessment and plan.

## 2018-04-13 NOTE — Anesthesia Preprocedure Evaluation (Addendum)
Anesthesia Evaluation  Patient identified by MRN, date of birth, ID band Patient awake    Reviewed: Allergy & Precautions, NPO status , Patient's Chart, lab work & pertinent test results  History of Anesthesia Complications (+) PONV and history of anesthetic complications  Airway Mallampati: III  TM Distance: >3 FB Neck ROM: Full    Dental  (+) Dental Advisory Given, Teeth Intact   Pulmonary neg pulmonary ROS,    breath sounds clear to auscultation       Cardiovascular  Rhythm:Regular Rate:Normal   '19 TTE - EF 55-60%, mild AI    Neuro/Psych PSYCHIATRIC DISORDERS Anxiety  Neuromuscular disease (neuropathy)    GI/Hepatic negative GI ROS, Neg liver ROS,   Endo/Other  negative endocrine ROS  Renal/GU negative Renal ROS     Musculoskeletal  (+) Arthritis , Osteoarthritis,    Abdominal   Peds  Hematology negative hematology ROS (+)   Anesthesia Other Findings HSV-1  Reproductive/Obstetrics                            Anesthesia Physical Anesthesia Plan  ASA: II  Anesthesia Plan: General   Post-op Pain Management:    Induction: Intravenous  PONV Risk Score and Plan: 4 or greater and Treatment may vary due to age or medical condition, Ondansetron, Midazolam and Dexamethasone  Airway Management Planned: LMA  Additional Equipment: None  Intra-op Plan:   Post-operative Plan: Extubation in OR  Informed Consent: I have reviewed the patients History and Physical, chart, labs and discussed the procedure including the risks, benefits and alternatives for the proposed anesthesia with the patient or authorized representative who has indicated his/her understanding and acceptance.     Dental advisory given  Plan Discussed with: CRNA and Anesthesiologist  Anesthesia Plan Comments:        Anesthesia Quick Evaluation

## 2018-04-14 ENCOUNTER — Emergency Department (HOSPITAL_COMMUNITY)
Admission: EM | Admit: 2018-04-14 | Discharge: 2018-04-14 | Disposition: A | Discharge status: Home / Self Care | Payer: Medicare Other | Source: Home / Self Care | Attending: Emergency Medicine | Admitting: Emergency Medicine

## 2018-04-14 ENCOUNTER — Encounter (HOSPITAL_COMMUNITY): Payer: Self-pay

## 2018-04-14 ENCOUNTER — Encounter (HOSPITAL_BASED_OUTPATIENT_CLINIC_OR_DEPARTMENT_OTHER)
Admission: RE | Disposition: A | Discharge status: Other Acute Inpatient Hospital | Payer: Self-pay | Source: Home / Self Care | Attending: Urology

## 2018-04-14 ENCOUNTER — Ambulatory Visit (HOSPITAL_BASED_OUTPATIENT_CLINIC_OR_DEPARTMENT_OTHER)
Admission: RE | Admit: 2018-04-14 | Discharge: 2018-04-14 | Disposition: A | Discharge status: Other Acute Inpatient Hospital | Payer: Medicare Other | Attending: Urology | Admitting: Urology

## 2018-04-14 ENCOUNTER — Encounter (HOSPITAL_BASED_OUTPATIENT_CLINIC_OR_DEPARTMENT_OTHER): Payer: Self-pay | Admitting: Anesthesiology

## 2018-04-14 ENCOUNTER — Other Ambulatory Visit: Payer: Self-pay

## 2018-04-14 ENCOUNTER — Ambulatory Visit (HOSPITAL_BASED_OUTPATIENT_CLINIC_OR_DEPARTMENT_OTHER): Payer: Medicare Other | Admitting: Anesthesiology

## 2018-04-14 DIAGNOSIS — I97191 Other postprocedural cardiac functional disturbances following other surgery: Secondary | ICD-10-CM | POA: Insufficient documentation

## 2018-04-14 DIAGNOSIS — N952 Postmenopausal atrophic vaginitis: Secondary | ICD-10-CM | POA: Diagnosis not present

## 2018-04-14 DIAGNOSIS — Z79899 Other long term (current) drug therapy: Secondary | ICD-10-CM

## 2018-04-14 DIAGNOSIS — R3915 Urgency of urination: Secondary | ICD-10-CM | POA: Diagnosis not present

## 2018-04-14 DIAGNOSIS — F419 Anxiety disorder, unspecified: Secondary | ICD-10-CM | POA: Diagnosis not present

## 2018-04-14 DIAGNOSIS — Z791 Long term (current) use of non-steroidal anti-inflammatories (NSAID): Secondary | ICD-10-CM | POA: Diagnosis not present

## 2018-04-14 DIAGNOSIS — R102 Pelvic and perineal pain: Secondary | ICD-10-CM | POA: Diagnosis not present

## 2018-04-14 DIAGNOSIS — I480 Paroxysmal atrial fibrillation: Secondary | ICD-10-CM | POA: Insufficient documentation

## 2018-04-14 DIAGNOSIS — Y838 Other surgical procedures as the cause of abnormal reaction of the patient, or of later complication, without mention of misadventure at the time of the procedure: Secondary | ICD-10-CM | POA: Diagnosis not present

## 2018-04-14 DIAGNOSIS — R350 Frequency of micturition: Secondary | ICD-10-CM | POA: Diagnosis not present

## 2018-04-14 DIAGNOSIS — N3289 Other specified disorders of bladder: Secondary | ICD-10-CM | POA: Diagnosis not present

## 2018-04-14 DIAGNOSIS — R300 Dysuria: Secondary | ICD-10-CM | POA: Diagnosis not present

## 2018-04-14 DIAGNOSIS — N3281 Overactive bladder: Secondary | ICD-10-CM

## 2018-04-14 DIAGNOSIS — E785 Hyperlipidemia, unspecified: Secondary | ICD-10-CM | POA: Diagnosis not present

## 2018-04-14 HISTORY — DX: Radiculopathy, lumbar region: M54.16

## 2018-04-14 HISTORY — DX: Herpesviral infection, unspecified: B00.9

## 2018-04-14 HISTORY — DX: Atherosclerosis of aorta: I70.0

## 2018-04-14 HISTORY — DX: Cardiac murmur, unspecified: R01.1

## 2018-04-14 HISTORY — DX: Plantar fascial fibromatosis: M72.2

## 2018-04-14 HISTORY — PX: CYSTO WITH HYDRODISTENSION: SHX5453

## 2018-04-14 HISTORY — DX: Lesion of plantar nerve, bilateral lower limbs: G57.63

## 2018-04-14 HISTORY — DX: Unspecified cataract: H26.9

## 2018-04-14 HISTORY — DX: Nonrheumatic aortic (valve) insufficiency: I35.1

## 2018-04-14 HISTORY — DX: Calculus of kidney: N20.0

## 2018-04-14 LAB — CBC WITH DIFFERENTIAL/PLATELET
Abs Immature Granulocytes: 0.02 10*3/uL (ref 0.00–0.07)
Basophils Absolute: 0 10*3/uL (ref 0.0–0.1)
Basophils Relative: 0 %
Eosinophils Absolute: 0.1 10*3/uL (ref 0.0–0.5)
Eosinophils Relative: 1 %
HCT: 44.1 % (ref 36.0–46.0)
HEMOGLOBIN: 14.1 g/dL (ref 12.0–15.0)
Immature Granulocytes: 0 %
LYMPHS ABS: 0.9 10*3/uL (ref 0.7–4.0)
LYMPHS PCT: 13 %
MCH: 28.4 pg (ref 26.0–34.0)
MCHC: 32 g/dL (ref 30.0–36.0)
MCV: 88.9 fL (ref 80.0–100.0)
Monocytes Absolute: 0.1 10*3/uL (ref 0.1–1.0)
Monocytes Relative: 1 %
NEUTROS ABS: 5.8 10*3/uL (ref 1.7–7.7)
Neutrophils Relative %: 85 %
Platelets: 268 10*3/uL (ref 150–400)
RBC: 4.96 MIL/uL (ref 3.87–5.11)
RDW: 14.1 % (ref 11.5–15.5)
WBC: 6.9 10*3/uL (ref 4.0–10.5)
nRBC: 0 % (ref 0.0–0.2)

## 2018-04-14 LAB — COMPREHENSIVE METABOLIC PANEL
ALT: 20 U/L (ref 0–44)
AST: 21 U/L (ref 15–41)
Albumin: 4.1 g/dL (ref 3.5–5.0)
Alkaline Phosphatase: 94 U/L (ref 38–126)
Anion gap: 8 (ref 5–15)
BUN: 19 mg/dL (ref 8–23)
CALCIUM: 9.1 mg/dL (ref 8.9–10.3)
CO2: 22 mmol/L (ref 22–32)
Chloride: 108 mmol/L (ref 98–111)
Creatinine, Ser: 0.63 mg/dL (ref 0.44–1.00)
GFR calc Af Amer: >60 mL/min (ref >60)
GFR calc non Af Amer: >60 mL/min (ref >60)
Glucose, Bld: 119 mg/dL — ABNORMAL HIGH (ref 70–99)
Potassium: 4.2 mmol/L (ref 3.5–5.1)
Sodium: 138 mmol/L (ref 135–145)
Total Bilirubin: 0.6 mg/dL (ref 0.3–1.2)
Total Protein: 7.2 g/dL (ref 6.5–8.1)

## 2018-04-14 LAB — TSH: TSH: 1.066 u[IU]/mL (ref 0.350–4.500)

## 2018-04-14 SURGERY — CYSTOSCOPY, WITH BLADDER HYDRODISTENSION
Anesthesia: General | Site: Bladder

## 2018-04-14 MED ORDER — PROPOFOL 10 MG/ML IV BOLUS
INTRAVENOUS | Status: DC | PRN
  Administered 2018-04-14: 150 mg via INTRAVENOUS
  Administered 2018-04-14: 40 mg via INTRAVENOUS

## 2018-04-14 MED ORDER — PHENAZOPYRIDINE HCL 200 MG PO TABS
ORAL | Status: DC | PRN
  Administered 2018-04-14: 09:00:00 via INTRAVESICAL

## 2018-04-14 MED ORDER — DEXAMETHASONE SODIUM PHOSPHATE 10 MG/ML IJ SOLN
INTRAMUSCULAR | Status: DC | PRN
  Administered 2018-04-14: 4 mg via INTRAVENOUS

## 2018-04-14 MED ORDER — OXYBUTYNIN CHLORIDE 5 MG PO TABS: 5.0000 mg | ORAL_TABLET | Freq: Three times a day (TID) | ORAL | 0 refills | Status: DC | PRN

## 2018-04-14 MED ORDER — SULFAMETHOXAZOLE-TRIMETHOPRIM 400-80 MG PO TABS: 1.0000 | ORAL_TABLET | Freq: Every day | ORAL | 0 refills | Status: DC

## 2018-04-14 MED ORDER — METOPROLOL TARTRATE 25 MG PO TABS: 12.5000 mg | ORAL_TABLET | Freq: Two times a day (BID) | ORAL | 0 refills | Status: DC | PRN

## 2018-04-14 MED ORDER — DILTIAZEM HCL 25 MG/5ML IV SOLN
15.0000 mg | Freq: Once | INTRAVENOUS | Status: DC
  Filled 2018-04-14 (×2): qty 5

## 2018-04-14 MED ORDER — FENTANYL CITRATE (PF) 100 MCG/2ML IJ SOLN
INTRAMUSCULAR | Status: AC
  Filled 2018-04-14: qty 2

## 2018-04-14 MED ORDER — PROPOFOL 10 MG/ML IV BOLUS
INTRAVENOUS | Status: AC
  Filled 2018-04-14: qty 40

## 2018-04-14 MED ORDER — DILTIAZEM HCL 25 MG/5ML IV SOLN
15.0000 mg | Freq: Once | INTRAVENOUS | Status: AC
  Administered 2018-04-14: 15 mg via INTRAVENOUS
  Filled 2018-04-14: qty 5

## 2018-04-14 MED ORDER — LIDOCAINE 2% (20 MG/ML) 5 ML SYRINGE
INTRAMUSCULAR | Status: AC
  Filled 2018-04-14: qty 5

## 2018-04-14 MED ORDER — ONDANSETRON HCL 4 MG/2ML IJ SOLN
4.0000 mg | Freq: Once | INTRAMUSCULAR | Status: DC | PRN
  Filled 2018-04-14: qty 2

## 2018-04-14 MED ORDER — MIDAZOLAM HCL 2 MG/2ML IJ SOLN
INTRAMUSCULAR | Status: AC
  Filled 2018-04-14: qty 2

## 2018-04-14 MED ORDER — BUPIVACAINE HCL 0.5 % IJ SOLN
INTRAMUSCULAR | Status: DC | PRN
  Administered 2018-04-14: 15 mL

## 2018-04-14 MED ORDER — CEFAZOLIN SODIUM-DEXTROSE 2-4 GM/100ML-% IV SOLN
INTRAVENOUS | Status: AC
  Filled 2018-04-14: qty 100

## 2018-04-14 MED ORDER — APIXABAN 5 MG PO TABS: 5.0000 mg | ORAL_TABLET | Freq: Two times a day (BID) | ORAL | 0 refills | Status: DC

## 2018-04-14 MED ORDER — ONDANSETRON HCL 4 MG/2ML IJ SOLN
INTRAMUSCULAR | Status: AC
  Filled 2018-04-14: qty 2

## 2018-04-14 MED ORDER — CEFAZOLIN SODIUM-DEXTROSE 2-4 GM/100ML-% IV SOLN
2.0000 g | Freq: Once | INTRAVENOUS | Status: AC
  Administered 2018-04-14: 2 g via INTRAVENOUS
  Filled 2018-04-14: qty 100

## 2018-04-14 MED ORDER — DEXAMETHASONE SODIUM PHOSPHATE 10 MG/ML IJ SOLN
INTRAMUSCULAR | Status: AC
  Filled 2018-04-14: qty 1

## 2018-04-14 MED ORDER — LACTATED RINGERS IV SOLN
INTRAVENOUS | Status: DC
  Administered 2018-04-14 (×2): via INTRAVENOUS
  Filled 2018-04-14: qty 1000

## 2018-04-14 MED ORDER — LIDOCAINE 2% (20 MG/ML) 5 ML SYRINGE
INTRAMUSCULAR | Status: DC | PRN
  Administered 2018-04-14: 60 mg via INTRAVENOUS

## 2018-04-14 MED ORDER — FENTANYL CITRATE (PF) 100 MCG/2ML IJ SOLN
25.0000 ug | INTRAMUSCULAR | Status: DC | PRN
  Filled 2018-04-14: qty 1

## 2018-04-14 MED ORDER — ONDANSETRON HCL 4 MG/2ML IJ SOLN
INTRAMUSCULAR | Status: DC | PRN
  Administered 2018-04-14: 4 mg via INTRAVENOUS

## 2018-04-14 MED ORDER — MIDAZOLAM HCL 2 MG/2ML IJ SOLN
INTRAMUSCULAR | Status: DC | PRN
  Administered 2018-04-14: 2 mg via INTRAVENOUS

## 2018-04-14 MED ORDER — FENTANYL CITRATE (PF) 100 MCG/2ML IJ SOLN
INTRAMUSCULAR | Status: DC | PRN
  Administered 2018-04-14: 100 ug via INTRAVENOUS

## 2018-04-14 MED ORDER — LIDOCAINE HCL URETHRAL/MUCOSAL 2 % EX GEL
CUTANEOUS | Status: DC | PRN
  Administered 2018-04-14: 1 via URETHRAL

## 2018-04-14 SURGICAL SUPPLY — 16 items
BAG DRAIN URO-CYSTO SKYTR STRL (DRAIN) ×2 IMPLANT
BAG DRN UROCATH (DRAIN) ×1
CATH ROBINSON RED A/P 16FR (CATHETERS) ×1 IMPLANT
CLOTH BEACON ORANGE TIMEOUT ST (SAFETY) ×2 IMPLANT
ELECT REM PT RETURN 9FT ADLT (ELECTROSURGICAL) ×2
ELECTRODE REM PT RTRN 9FT ADLT (ELECTROSURGICAL) ×1 IMPLANT
GLOVE BIO SURGEON STRL SZ7.5 (GLOVE) ×3 IMPLANT
GOWN STRL REUS W/ TWL XL LVL3 (GOWN DISPOSABLE) ×1 IMPLANT
GOWN STRL REUS W/TWL XL LVL3 (GOWN DISPOSABLE) ×2
KIT TURNOVER CYSTO (KITS) ×2 IMPLANT
MANIFOLD NEPTUNE II (INSTRUMENTS) ×1 IMPLANT
NEEDLE HYPO 22GX1.5 SAFETY (NEEDLE) IMPLANT
NS IRRIG 500ML POUR BTL (IV SOLUTION) IMPLANT
PACK CYSTO (CUSTOM PROCEDURE TRAY) ×2 IMPLANT
TUBE CONNECTING 12X1/4 (SUCTIONS) IMPLANT
WATER STERILE IRR 3000ML UROMA (IV SOLUTION) ×2 IMPLANT

## 2018-04-14 NOTE — Progress Notes (Signed)
   Pt is a. Fib in PACU and Cardiology recommend transfer to [facility] ED for eval and treatment. She has no CP or SOB and is stable.   . Vitals:   04/14/18 1015 04/14/18 1030  BP: 138/88   Pulse: (!) 136 62  Resp: 15 13  Temp: (!) 97.5 F (36.4 C)   SpO2: 99% 99%    We went over the bladder findings of good functional capacity, no worrisome lesions and findings consistent with IC (hemorrhage and ulceration).

## 2018-04-14 NOTE — ED Provider Notes (Signed)
[facility] DEPT Provider Note   CSN: [id number] Arrival date & time: 04/14/18  1114     History   Chief Complaint Chief Complaint  Patient presents with  . Irregular Heart Beat    HPI Lorraine [name] is a 68 y.o. female.  HPI 68 year old female with a history of hyperlipidemia, mild aortic regurgitation noted on echo, history of prior palpitations and work-up with cardiology in 2014 with findings of PVCs, presents from the PACU with concern for atrial fibrillation.  Patient has his cystoscopy done today with Dr. Junious Silk, and while she is in the PACU, developed atrial fibrillation with RVR and a rate in the 130s.  She received 15 mg of diltiazem, and converted to sinus rhythm at 10:30 AM.  She was then transferred to the emergency department for further care.  Patient denies having any symptoms, even while she was in atrial fibrillation.  Denies chest pain, shortness of breath, lightheadedness.  Denies any other acute concerns or recent illness.   Past Medical History:  Diagnosis Date  . Anxiety   . Aortic atherosclerosis ([place]) 02/03/2018   Noted on CT Abd/Pelvis  . AR (aortic regurgitation) 07/18/2017   Mild, noted on ECHO  . Bilateral cataracts   . Bilateral plantar fasciitis   . Chronic knee pain   . Chronic low back pain   . Chronic lumbar radiculopathy 07/14/2017   Mild, L5, noted on electromyography  . History of palpitations    2014-- low risk exercise tolerence test, no ischemi, PVC's (09-06-2012)  . HSV-1 (herpes simplex virus 1) infection    Cold sore  . Hx of colonic polyps   . Hyperlipidemia   . Insomnia   . Lactose intolerance   . Left nephrolithiasis 02/03/2018   Nonobstructing, noted on CT Abd/pelvis  . Morton's metatarsalgia, neuralgia, or neuroma, bilateral   . Osteoarthritis    knees and lumbar, feet  . Osteopenia   . Peripheral neuropathy    bilateral feet -- burning and stinging  . PONV (postoperative nausea and  vomiting)    severe  . PVC's (premature ventricular contractions)   . Systolic murmur    "Slight"  . Vaginal cyst   . Wears glasses     Patient Active Problem List   Diagnosis Date Noted  . Systolic murmur 02/63/7858  . Shingles 11/23/2016  . Arthritis 05/26/2016  . Vaginal pain 05/26/2016  . Insomnia 05/26/2016  . Neuropathy 05/26/2016  . Positive ANA (antinuclear antibody) 05/26/2016  . Hyperlipidemia 05/26/2016  . Nausea & vomiting 01/26/2014  . Dehydration 01/26/2014  . Neck and shoulder pain 01/26/2014  . Cervicalgia 01/26/2014  . Palpitations 08/31/2012  . METATARSALGIA 05/06/2010  . PLANTAR FASCIITIS, LEFT 05/06/2010  . FOOT PAIN, BILATERAL 05/06/2010    Past Surgical History:  Procedure Laterality Date  . ANTERIOR CERVICAL DECOMP/DISCECTOMY FUSION  01/2014   C4 -- C6  . CATARACT EXTRACTION W/ INTRAOCULAR LENS  IMPLANT, BILATERAL  2015  . COLONOSCOPY  05/14/2015  . ELBOW DEBRIDEMENT Right 09/10/2010   and tendon debridement and radial tunnel release  . EXCISION MORTON'S NEUROMA Bilateral left 02-20-2009/  right & left 05-08-2009  . EXCISION VAGINAL CYST N/A 07/01/2016   Procedure: EXCISION VAGINAL CYST;  Surgeon: Tyson Dense, MD;  Location: [facility];  Service: Gynecology;  Laterality: N/A;  . PLANTAR FASCIA RELEASE Bilateral left 04-02-2010/  right 04-02-2016  . TONSILLECTOMY AND ADENOIDECTOMY  child  . TOTAL ABDOMINAL HYSTERECTOMY  1974   w/  Left salpingoophorectomy and Partial right salpingoophorectomy     OB History   No obstetric history on file.      Home Medications    Prior to Admission medications   Medication Sig Start Date End Date Taking? Authorizing Provider  diazepam (VALIUM) 10 MG tablet Take 10 mg by mouth every 6 (six) hours as needed for anxiety. 3 times per week currently   Yes [provider]  hydrOXYzine (ATARAX/VISTARIL) 10 MG tablet Take 10 mg by mouth 3 (three) times daily as needed for anxiety.  3 times per week currently    Yes [provider]  Meth-Hyo-M Bl-Na Phos-Ph Sal (URIBEL) 118 MG CAPS Take 118 mg by mouth as needed (pressure/bladder pain). Uses 3 times per week currently    Yes [provider]  polyvinyl alcohol (LIQUIFILM TEARS) 1.4 % ophthalmic solution Place 1 drop into both eyes as needed for dry eyes.   Yes [provider]  zolpidem (AMBIEN) 10 MG tablet Take 1 tablet (10 mg total) by mouth at bedtime. 01/03/18  Yes [name], MD  apixaban (ELIQUIS) 5 MG TABS tablet Take 1 tablet (5 mg total) by mouth 2 (two) times daily for 30 days. 04/14/18 05/14/18  Gareth Morgan, MD  metoprolol tartrate (LOPRESSOR) 25 MG tablet Take 0.5 tablets (12.5 mg total) by mouth 2 (two) times daily as needed (for elevated heart rate take 12.5mg , if your symptoms continue, you may take another 12.5mg  30 minutes later.). 04/14/18   Gareth Morgan, MD  oxybutynin (DITROPAN) 5 MG tablet Take 1 tablet (5 mg total) by mouth every 8 (eight) hours as needed for bladder spasms. 04/14/18   [name], MD  sulfamethoxazole-trimethoprim (BACTRIM,SEPTRA) 400-80 MG tablet Take 1 tablet by mouth at bedtime. 04/14/18   [name], MD  valACYclovir (VALTREX) 1000 MG tablet Take 1 tablet (1,000 mg total) by mouth daily. Patient not taking: Reported on 04/06/2018 07/14/17   [name], MD    Family History Family History  Problem Relation Age of Onset  . Lung cancer Mother        lung  . Heart attack Father   . Memory loss Father   . Cancer Paternal Grandfather        colon  . Multiple sclerosis Brother     Social History Social History   Tobacco Use  . Smoking status: Never Smoker  . Smokeless tobacco: Never Used  Substance Use Topics  . Alcohol use: Yes    Comment: rare  . Drug use: No     Allergies   Amitriptyline; Codeine; and Nortriptyline   Review of Systems Review of Systems  Constitutional: Negative for fever.  HENT: Negative  for sore throat.   Eyes: Negative for visual disturbance.  Respiratory: Negative for cough and shortness of breath.   Cardiovascular: Negative for chest pain.  Gastrointestinal: Negative for abdominal pain.  Musculoskeletal: Negative for back pain and myalgias.  Skin: Negative for rash.  Neurological: Negative for syncope and headaches.     Physical Exam Updated Vital Signs BP (!) 109/59 (BP Location: Right Arm)   Pulse (!) 54   Temp 98 F (36.7 C)   Resp 14   SpO2 95%   Physical Exam Vitals signs and nursing note reviewed.  Constitutional:      General: She is not in acute distress.    Appearance: She is well-developed. She is not diaphoretic.  HENT:     Head: Normocephalic and atraumatic.  Eyes:  Conjunctiva/sclera: Conjunctivae normal.  Neck:     Musculoskeletal: Normal range of motion.  Cardiovascular:     Rate and Rhythm: Normal rate and regular rhythm.     Heart sounds: Normal heart sounds. No murmur. No friction rub. No gallop.   Pulmonary:     Effort: Pulmonary effort is normal. No respiratory distress.     Breath sounds: Normal breath sounds. No wheezing or rales.  Abdominal:     General: There is no distension.     Palpations: Abdomen is soft.     Tenderness: There is no abdominal tenderness. There is no guarding.  Musculoskeletal:        General: No tenderness.  Skin:    General: Skin is warm and dry.     Findings: No erythema or rash.  Neurological:     Mental Status: She is alert and oriented to person, place, and time.      ED Treatments / Results  Labs (all labs ordered are listed, but only abnormal results are displayed) Labs Reviewed  COMPREHENSIVE METABOLIC PANEL - Abnormal; Notable for the following components:      Result Value   Glucose, Bld 119 (*)    All other components within normal limits  CBC WITH DIFFERENTIAL/PLATELET  TSH    EKG EKG Interpretation  Date/Time:  Friday April 14 2018 11:18:39 EST Ventricular Rate:   55 PR Interval:    QRS Duration: 93 QT Interval:  405 QTC Calculation: 388 R Axis:   33 Text Interpretation:  Sinus rhythm Since prior ECG, patient now in sinus rhythm, rate has slowed Confirmed by Gareth Morgan [phone]) on 04/14/2018 11:44:45 AM   Radiology No results found.  Procedures Procedures (including critical care time)  Medications Ordered in ED Medications - No data to display   Initial Impression / Assessment and Plan / ED Course  I have reviewed the triage vital signs and the nursing notes.  Pertinent labs & imaging results that were available during my care of the patient were reviewed by me and considered in my medical decision making (see chart for details).     68 year old female with a history of hyperlipidemia, mild aortic regurgitation noted on echo, history of prior palpitations and work-up with cardiology in 2014 with findings of PVCs, presents from the PACU with concern for atrial fibrillation.  Patient has his cystoscopy done today with Dr. Junious Silk, and while she is in the PACU, developed atrial fibrillation with RVR and a rate in the 130s.  She received 15 mg of diltiazem, and converted to sinus rhythm at 10:30 AM. Remains in NSR in the ED and asymptomatic.  Labs WNL.  Chads2Vasc is 2. Discussed with Dr. Debara Pickett of Cardiology who recommends anticoagulation with eliquis and discussed with Dr. Junious Silk or Urology given procedure and biopsies today who did not see a contraindication to starting the medication today.  Discussed bleeding risk.  Gave rx for prn metoprolol however given HR baseline in 50s, BP 100s, one episode of known afib after surgery, will not initiate daily metoprolol at this time. Recommend follow up in afib clinic. Patient discharged in stable condition with understanding of reasons to return.   Final Clinical Impressions(s) / ED Diagnoses   Final diagnoses:  Paroxysmal atrial fibrillation [facility])    ED Discharge Orders         Ordered     apixaban (ELIQUIS) 5 MG TABS tablet  2 times daily     04/14/18 1305    metoprolol tartrate (LOPRESSOR)  25 MG tablet  2 times daily PRN     04/14/18 1305           Gareth Morgan, MD 04/14/18 2122

## 2018-04-14 NOTE — Transfer of Care (Signed)
Immediate Anesthesia Transfer of Care Note  Patient: Lorraine [name]  Procedure(s) Performed: CYSTOSCOPY/HYDRODISTENSION WITH INSTILLATION OF PYRIDIUM AND MARCAINE, BLADDER BIOPSY WITH FULGERATION 0.5 TO 2 CM (N/A Bladder)  Patient Location: PACU  Anesthesia Type:General  Level of Consciousness: awake, alert  and oriented  Airway & Oxygen Therapy: Patient Spontanous Breathing and Patient connected to nasal cannula oxygen  Post-op Assessment: Report given to RN and Post -op Vital signs reviewed and stable  Post vital signs: Reviewed and stable  Last Vitals:  Vitals Value Taken Time  BP    Temp    Pulse    Resp    SpO2      Last Pain:  Vitals:   04/14/18 0715  TempSrc:   PainSc: 0-No pain      Patients Stated Pain Goal: 5 (70/62/37 6283)  Complications: No apparent anesthesia complications

## 2018-04-14 NOTE — Anesthesia Procedure Notes (Signed)
Procedure Name: LMA Insertion Performed by: Jonna Munro, CRNA Pre-anesthesia Checklist: Patient identified, Emergency Drugs available, Suction available, Patient being monitored and Timeout performed Patient Re-evaluated:Patient Re-evaluated prior to induction Oxygen Delivery Method: Circle system utilized Preoxygenation: Pre-oxygenation with 100% oxygen Induction Type: IV induction LMA: LMA inserted LMA Size: 4.0 Number of attempts: 1 Placement Confirmation: positive ETCO2 and breath sounds checked- equal and bilateral Dental Injury: Teeth and Oropharynx as per pre-operative assessment

## 2018-04-14 NOTE — ED Triage Notes (Signed)
Patient BIB surgery center with complaints of new onset Afib with RVR in 130s rate post cystoscopy. Patient was asymptomatic other than the increased and irregular heart beat. 74 G left hand PIV placed by surgery center and 15mg  IV Diltiazem was administered at approx 0930. Patient converted to SR at approx 1030. Patient brought to ED for cardiology consult. Patient denies CP/SOB. Patient VSS at this time.

## 2018-04-14 NOTE — Progress Notes (Signed)
I saw patient in the emergency room.  She is back in a regular rhythm.  I thought it was okay for her to start anticoagulation although she is at risk for hematuria.  She is voiding with the purewick and the urine is clear. She has not bladder pain - wean of hydroxyzine and vag. valium.

## 2018-04-14 NOTE — Anesthesia Postprocedure Evaluation (Signed)
Anesthesia Post Note  Patient: Lorraine [name]  Procedure(s) Performed: CYSTOSCOPY/HYDRODISTENSION WITH INSTILLATION OF PYRIDIUM AND MARCAINE, BLADDER BIOPSY WITH FULGERATION 0.5 TO 2 CM (N/A Bladder)     Patient location during evaluation: PACU Anesthesia Type: General Level of consciousness: awake and alert Pain management: pain level controlled Vital Signs Assessment: post-procedure vital signs reviewed and stable Respiratory status: spontaneous breathing, nonlabored ventilation, respiratory function stable and patient connected to nasal cannula oxygen Cardiovascular status: blood pressure returned to baseline, stable and tachycardic Postop Assessment: no apparent nausea or vomiting Anesthetic complications: no Comments: Patient with new onset atrial fibrillation in PACU, captured in 12 lead EKG. HR in 130's, only slightly improved with diltiazem. Patient will be sent to [facility] for further management.    Last Vitals:  Vitals:   04/14/18 1000 04/14/18 1015  BP: 112/81 138/88  Pulse: 96 (!) 136  Resp: 15 15  Temp:  (!) 36.4 C  SpO2: 98% 99%    Last Pain:  Vitals:   04/14/18 0715  TempSrc:   PainSc: 0-No pain                 Audry Pili

## 2018-04-14 NOTE — Discharge Instructions (Signed)
Hydrodistention of the Bladder, Care After This sheet gives you information about how to care for yourself after your procedure. Your health care provider may also give you more specific instructions. If you have problems or questions, contact your health care provider. What can I expect after the procedure? After the procedure, it is common to have:  Soreness and mild discomfort in your lower abdomen.  Mild pain when you urinate. Pain should stop within a few minutes after you urinate. This may last for up to a week.  A small amount of blood in your urine. Follow these instructions at home: Medicines  Take over-the-counter and prescription medicines only as told by your health care provider.  Do not drive for 24 hours if you were given a sedative during your procedure.  Do not drive or use heavy machinery while taking prescription pain medicine.  If you were prescribed an antibiotic medicine, take it as told by your health care provider. Do not stop taking the antibiotic even if you start to feel better. Lifestyle  Do not use any products that contain nicotine or tobacco, such as cigarettes, e-cigarettes, and chewing tobacco. If you need help quitting, ask your health care provider. Activity  Return to your normal activities as told by your health care provider. Ask your health care provider what activities are safe for you.  Do not lift anything that is heavier than 10 lb (4.5 kg), or the limit that you are told, until your health care provider says that it is safe. Eating and drinking   Follow instructions from your health care provider about eating or drinking restrictions.  Drink enough fluid to keep your urine pale yellow. General instructions  If a sample was removed for testing (biopsy), it is your responsibility to get your test results. Ask your health care provider or the department doing the test when your results will be ready.  Keep all follow-up visits as told by  your health care provider. This is important. Contact a health care provider if you have:  Blood clots in your urine.  Pus in your urine.  Pain that gets worse or does not get better with medicine, especially pain when you urinate.  Difficulty urinating.  Nausea or you vomit for more than 2 days after the procedure.  A fever. Get help right away if you:  Have severe pain in your abdomen.  Cannot urinate.  Have chest pain or difficulty breathing.  Develop swelling or pain (or both) in your lower legs. Summary  After this procedure, it is common to have soreness in your lower abdomen, mild pain when you urinate, and a small amount of blood in your urine.  Return to your normal activities as told by your health care provider.  Contact a health care provider if you have pain that gets worse, have a fever, see blood clots in your urine, or have difficulty urinating.  Get help right away if you have severe pain in your abdomen, cannot urinate, have difficulty breathing, or develop swelling and pain in the legs.

## 2018-04-14 NOTE — ED Notes (Signed)
Bed: QQ59 Expected date:  Expected time:  Means of arrival:  Comments: Tx From Post-op

## 2018-04-14 NOTE — Op Note (Signed)
Preoperative diagnosis: Urinary frequency, urgency, suprapubic pressure Postoperative diagnosis: Same  Procedure: Cystoscopy with low pressure short duration hydrodistention and instillation of Pyridium/Marcaine  Surgeon: Junious Silk  Anesthesia: General  Indication for procedure: 68 year old white female with a recent history of urinary frequency urgency and suprapubic pressure.  Urine cultures have been negative in office cystoscopy negative.  She is tried multiple treatments for urinary frequency urgency and then consideration of interstitial cystitis bladder installations.  None of these treatments have been particularly effective.  She was brought today for exam under anesthesia, cystoscopy and hydrodistention to determine other potential symptom causes (e.g., stones, tumors) and for Hunner's lesions and for possible therapeutic benefit.   Findings: On exam under anesthesia the vulva and the vagina appeared normal.  There were no lesions.  There was no introital stenosis.  There was no prolapse.  There was mild atrophic vaginitis.  The meatus was narrow.  On bimanual exam there were no palpable masses of the bladder or urethra.  There was good vaginal length.  On cystoscopy there was no stone or foreign body in the bladder.  The trigone and ureteral orifice ease appeared normal.  There was clear reflux.  The urethra and the bladder neck was normal.  There were mild petechiae diffuse throughout the bladder.  There were no mucosal lesions or papillary lesions.  Bladder capacity 650 cc.  Diffuse petechial hemorrhage more prominent in the right and left inferior bladder possibly consistent with Hunner's ulcers.  Description of procedure: After consent was obtained patient brought to the operating room.  After adequate anesthesia she was placed in lithotomy position and prepped and draped in usual sterile fashion.  A timeout was performed to confirm the patient and procedure.  An exam under anesthesia  was performed.  I had to use the 14 French red rubber catheter to cannulate the urethra and then the cystoscope with the obturator to dilate the meatus as it was a bit tight.  The cystoscope was passed per urethra and the bladder carefully inspected with a 30 degree and 70 degree lens.  There were mild petechiae.  The bladder was irrigated several times.  I then performed a hydrodistention by distending the bladder with the bag at 80 cm of water.  The bladder was filled until the irrigation flow began to stop.  I left the bladder distended for 3 minutes.  The bladder was drained and the capacity measured at 650 cc.  There was diffuse petechial hemorrhage but bleeding stopped quickly.  There were some focal ulceration in the right and the left inferior bladder.  A representative area on the left bladder was biopsied.  The left and the right areas of ulceration were fulgurated.  A more prominent petechial hemorrhage posteriorly was biopsied and fulgurated.  Hemostasis was excellent at low pressure.  The bladder was drained and the scope removed.  A 14 French red rubber catheter was passed into the bladder and Pyridium/Marcaine mixture instilled.  The catheter was removed.  Lidocaine jelly was instilled per urethra and on the meatus.  She was awakened and then taken to the recovery room in stable condition.  Complications: None  Blood loss: Minimal  Specimens to pathology: #1 left bladder biopsy #2 posterior bladder biopsy  Drains: none   Disposition: Patient stable to PACU

## 2018-04-14 NOTE — Interval H&P Note (Signed)
History and Physical Interval Note:  04/14/2018 8:13 AM  Lorraine [name]  has presented today for surgery, with the diagnosis of URINARY FREQUENCY, URGENCY  The various methods of treatment have been discussed with the patient and family. After consideration of risks, benefits and other options for treatment, the patient has consented to  Procedure(s): CYSTOSCOPY/HYDRODISTENSION WITH INSTILLATION OF PYRIDIUM AND MARCAINE (N/A) as a surgical intervention .  The patient's history has been reviewed, patient examined, no change in status, stable for surgery.  I have reviewed the patient's chart and labs.  She continues to have frequency and urgency.  She also complains of dysuria.  Recent UA and culture negative.  She was prescribed transvaginal estrogen by her gynecologist and has not started it.  We went over the nature risk benefits and alternatives to transvaginal estrogen and discussed this can be beneficial for the dysuria and she will consider.  Questions were answered to the patient's satisfaction.     [name]

## 2018-04-16 IMAGING — MR MR FOOT*R* W/O CM
4 of 5 series · 24 of 40 positions shown · non-contrast
Comparison: None.

CLINICAL DATA: Lateral right foot pain anterior to the fibula.
History of fall September 2015.

EXAM:
MRI OF THE RIGHT FOREFOOT WITHOUT CONTRAST
TECHNIQUE: Multiplanar, multisequence MR imaging of the ankle was performed. No
intravenous contrast was administered.

[Series 3: T2 fat-sat · sagittal · 3.0mm · 0.31mm/px · 6 of 20 slices shown (1 of 3)]
[im 1/20]
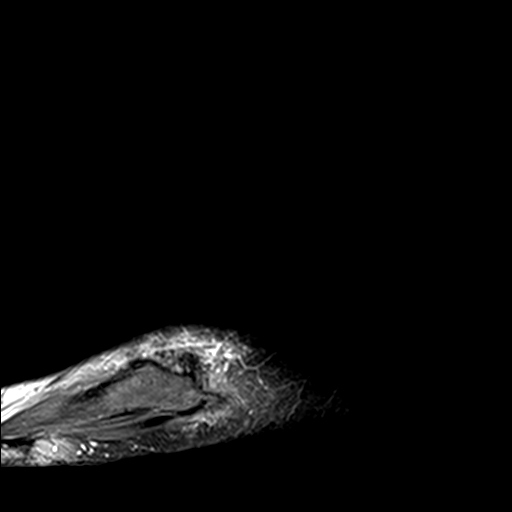
[im 4/20]
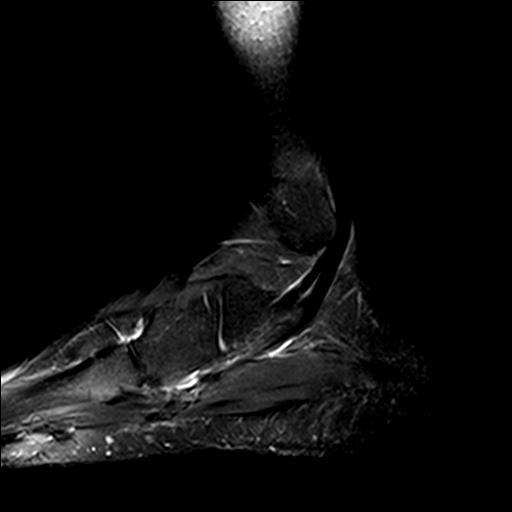
[im 8/20]
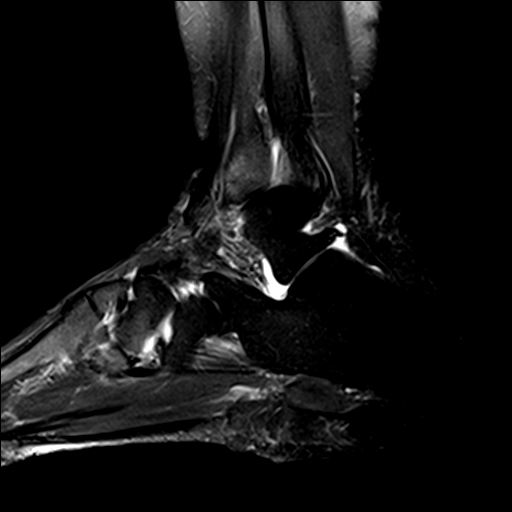
[im 12/20]
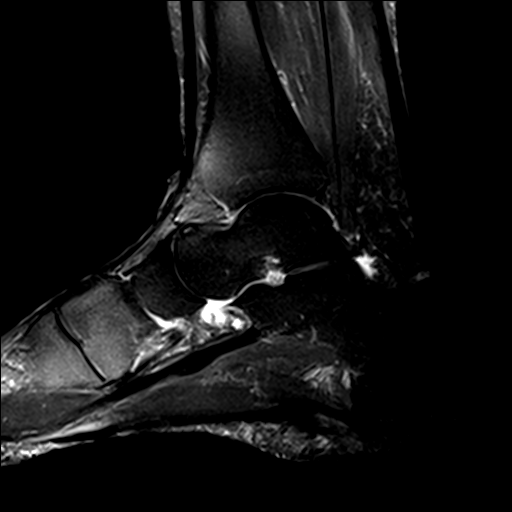
[im 16/20]
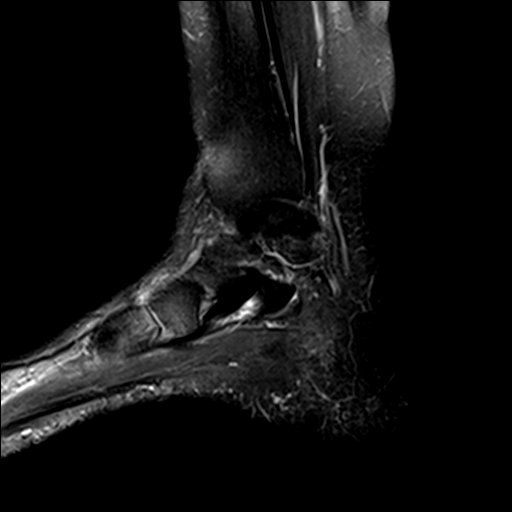
[im 20/20]
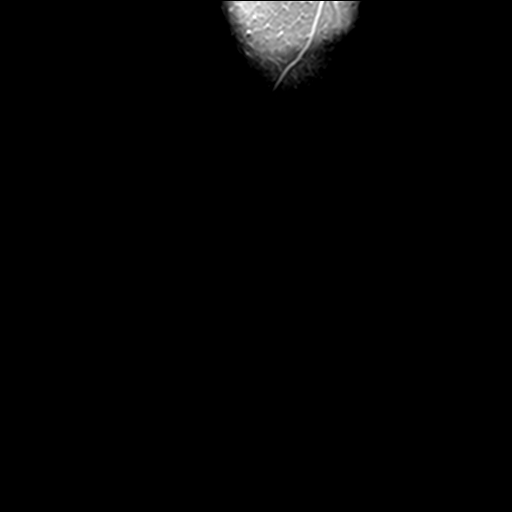

[Series 4: PD fat-sat · axial · 3.5mm · 0.31mm/px · z∈[-76,+38]mm · 9 of 27 slices shown]
[im 1/27]
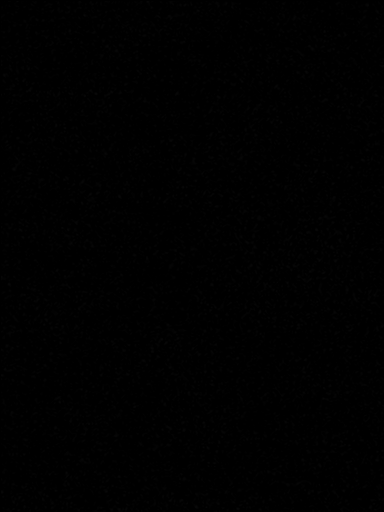
[im 4/27]
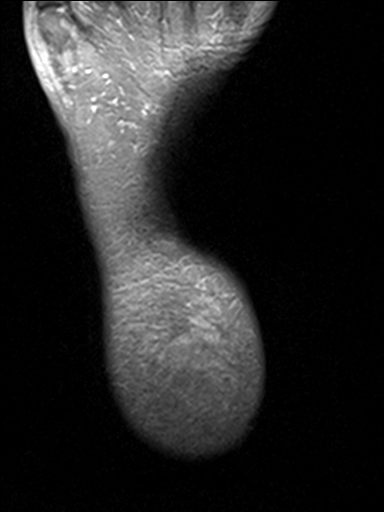
[im 7/27]
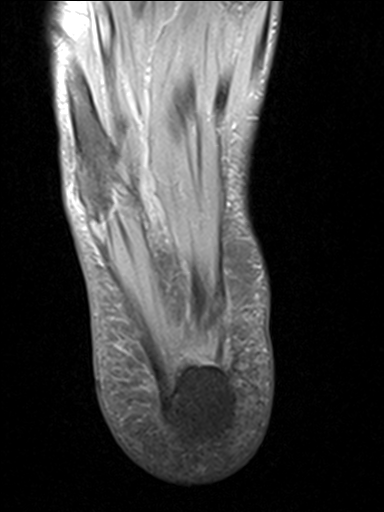
[im 10/27]
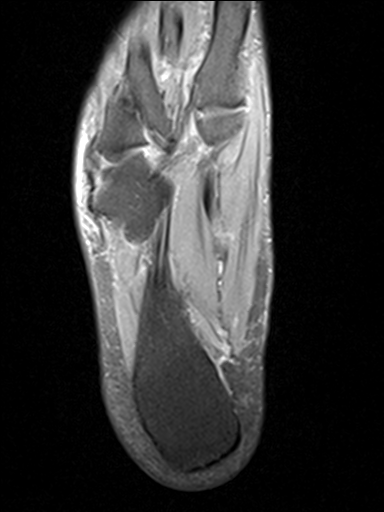
[im 14/27]
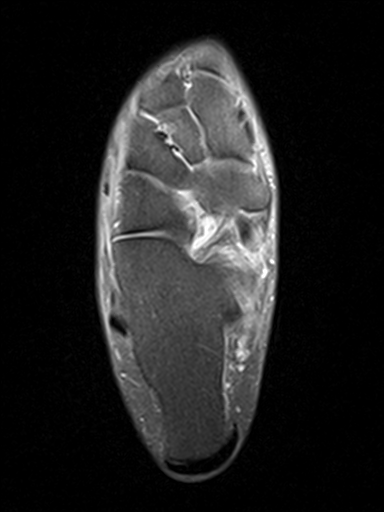
[im 17/27]
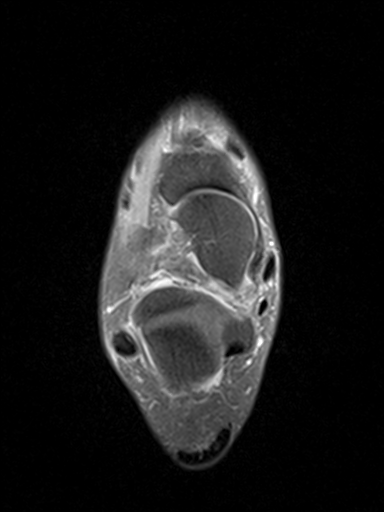
[im 20/27]
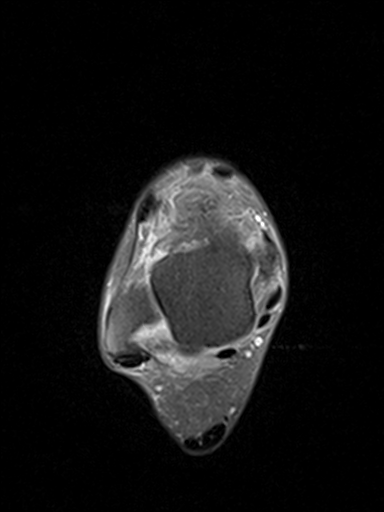
[im 23/27]
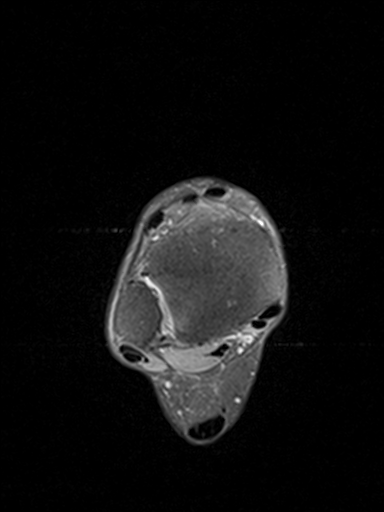
[im 27/27]
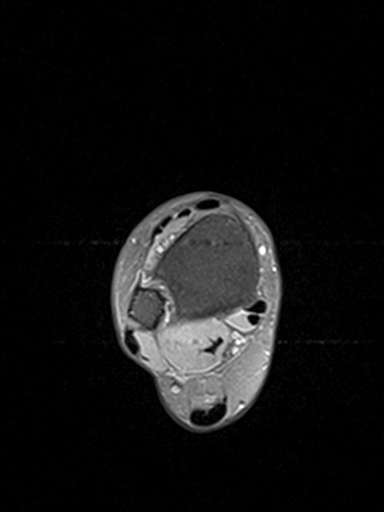

[Series 5: T2 fat-sat · axial · 3.5mm · 0.31mm/px · z∈[-76,+21]mm · 6 of 27 slices shown (2 of 3)]
[im 1/27]
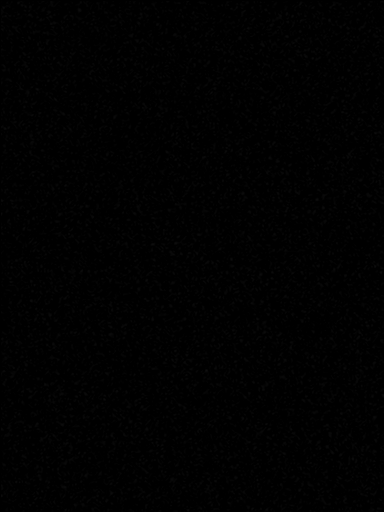
[im 4/27]
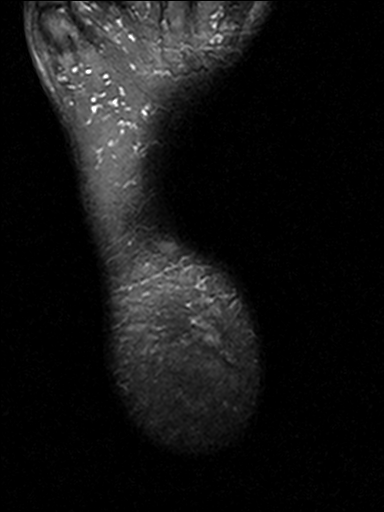
[im 7/27]
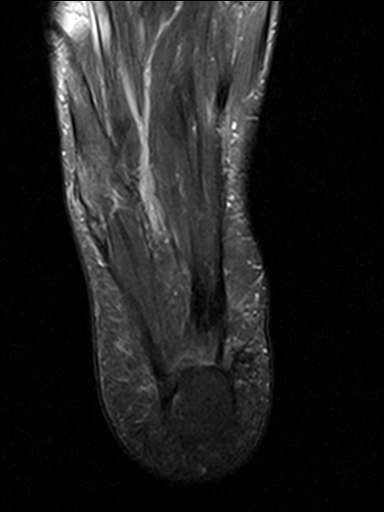
[im 10/27]
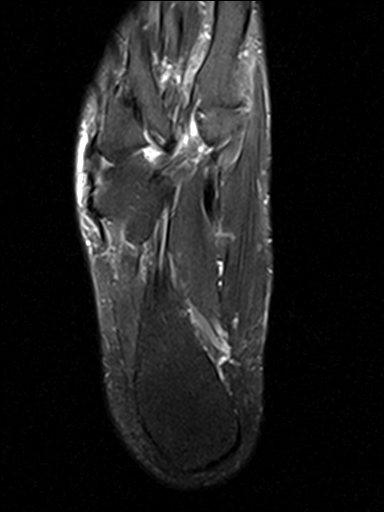
[im 14/27]
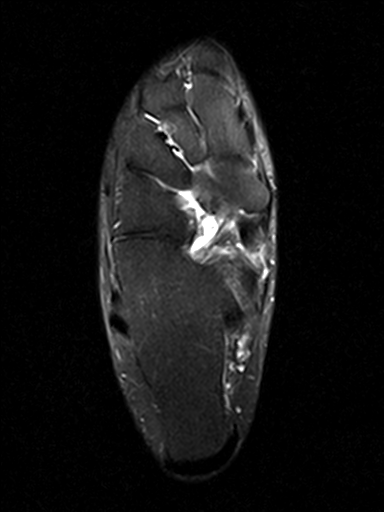
[im 23/27]
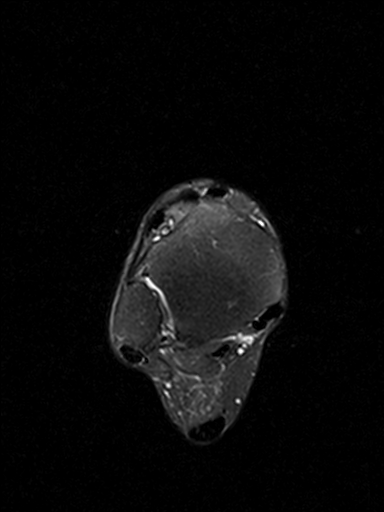

[Series 7: T2 fat-sat · coronal · 4.0mm · 0.31mm/px · 3 of 30 slices shown (3 of 3)]
[im 4/30]
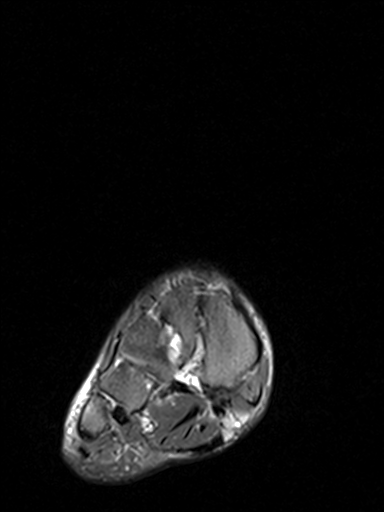
[im 17/30]
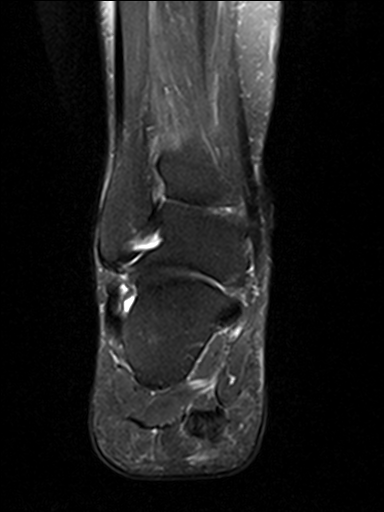
[im 26/30]
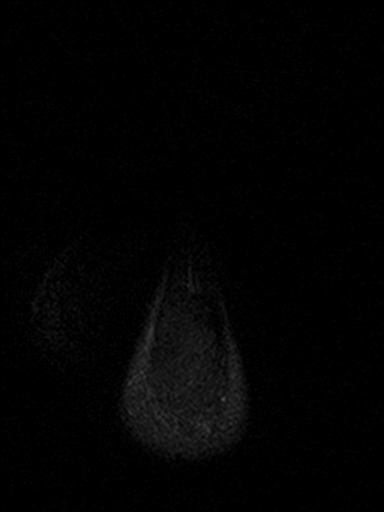

[24 of 40 positions shown; findings below may reference images not displayed]

FINDINGS: TENDONS

Peroneal: Intact.

Posteromedial: Intact.

Anterior: Intact.

Achilles: Intact.

Plantar Fascia: There is thickening and intrasubstance increased T2
signal in the plantar fascia, worse in the medial cord consistent
with fasciitis. Small plantar calcaneal spur is noted.

LIGAMENTS

Lateral: Intact.

Medial: Intact.

CARTILAGE

Ankle Joint: Unremarkable.

Subtalar Joints/Sinus Tarsi: Unremarkable.

Bones: Normal marrow signal throughout.

Other: None.
IMPRESSION: The study is positive for plantar fasciitis which appears moderately
severe to severe and worst in the medial cord.

No focal abnormality about the lateral aspect of the ankle in the
region of concern is identified.

## 2018-04-17 ENCOUNTER — Encounter (HOSPITAL_BASED_OUTPATIENT_CLINIC_OR_DEPARTMENT_OTHER): Payer: Self-pay | Admitting: Urology

## 2018-04-17 ENCOUNTER — Encounter: Payer: Self-pay | Admitting: Physician Assistant

## 2018-04-17 ENCOUNTER — Ambulatory Visit (HOSPITAL_COMMUNITY)
Admission: RE | Admit: 2018-04-17 | Discharge: 2018-04-17 | Disposition: A | Discharge status: Home / Self Care | Payer: Medicare Other | Source: Ambulatory Visit | Attending: Nurse Practitioner | Admitting: Nurse Practitioner

## 2018-04-17 VITALS — BP 114/68 | HR 63 | Ht 64.5 in | Wt 177.0 lb

## 2018-04-17 DIAGNOSIS — Z6829 Body mass index (BMI) 29.0-29.9, adult: Secondary | ICD-10-CM | POA: Insufficient documentation

## 2018-04-17 DIAGNOSIS — I480 Paroxysmal atrial fibrillation: Secondary | ICD-10-CM | POA: Insufficient documentation

## 2018-04-17 DIAGNOSIS — Z7901 Long term (current) use of anticoagulants: Secondary | ICD-10-CM | POA: Insufficient documentation

## 2018-04-17 DIAGNOSIS — E669 Obesity, unspecified: Secondary | ICD-10-CM | POA: Insufficient documentation

## 2018-04-17 DIAGNOSIS — M5136 Other intervertebral disc degeneration, lumbar region: Secondary | ICD-10-CM | POA: Diagnosis not present

## 2018-04-17 DIAGNOSIS — Z791 Long term (current) use of non-steroidal anti-inflammatories (NSAID): Secondary | ICD-10-CM | POA: Insufficient documentation

## 2018-04-17 DIAGNOSIS — R079 Chest pain, unspecified: Secondary | ICD-10-CM | POA: Diagnosis not present

## 2018-04-17 DIAGNOSIS — Z79899 Other long term (current) drug therapy: Secondary | ICD-10-CM | POA: Insufficient documentation

## 2018-04-17 DIAGNOSIS — E785 Hyperlipidemia, unspecified: Secondary | ICD-10-CM | POA: Insufficient documentation

## 2018-04-17 DIAGNOSIS — M9903 Segmental and somatic dysfunction of lumbar region: Secondary | ICD-10-CM | POA: Diagnosis not present

## 2018-04-17 MED ORDER — APIXABAN 5 MG PO TABS: 5.0000 mg | ORAL_TABLET | Freq: Two times a day (BID) | ORAL | 6 refills | Status: DC

## 2018-04-17 NOTE — Patient Instructions (Signed)
Have cbc/bmet drawn at primary office in 1 month - the orders are in their computer system.  Scheduling will be in touch with you to set up stress testing.

## 2018-04-17 NOTE — Progress Notes (Signed)
Primary Care Physician: [name], MD Primary Cardiologist: Dr Debara Pickett  Referring Physician: [facility]   Lorraine [name] is a 68 y.o. female with a history of paroxysmal atrial fibrillation who presents for consultation in the [facility].  The patient was initially diagnosed with atrial fibrillation 04/14/18 after having a cystoscopy she developed atrial fibrillation with RVR HR 130s in PACU. She was asymptomatic during this episode. She was given 15 mg diltiazem and converted to NSR. She was then started on Eliquis and PRN metoprolol. Of note, about 2 months ago, she did have an episode of chest pain at work during a very stressful situation which she describes as "an elephant sitting on my chest" which radiated to her neck and jaw. This lasted for about one hour.   Today, she denies symptoms of palpitations, chest pain, shortness of breath, orthopnea, PND, lower extremity edema, dizziness, presyncope, syncope, snoring, daytime somnolence, bleeding, or neurologic sequela. The patient is tolerating medications without difficulties and is otherwise without complaint today.    Atrial Fibrillation Risk Factors:  she does not have symptoms or diagnosis of sleep apnea.  she does not have a history of rheumatic fever.  she does not have a history of alcohol use.  she has a BMI of Body mass index is 29.91 kg/m.Marland Kitchen Filed Weights   04/17/18 0839  Weight: 80.3 kg     Atrial Fibrillation Management history:  Previous antiarrhythmic drugs: none  Previous cardioversions: none  Previous ablations: none  CHADS2VASC score: 2 (female, age)  Anticoagulation history: Eliquis   Past Medical History:  Diagnosis Date  . Anxiety   . Aortic atherosclerosis ([place]) 02/03/2018   Noted on CT Abd/Pelvis  . AR (aortic regurgitation) 07/18/2017   Mild, noted on ECHO  . Bilateral cataracts   . Bilateral plantar fasciitis   . Chronic knee pain   . Chronic  low back pain   . Chronic lumbar radiculopathy 07/14/2017   Mild, L5, noted on electromyography  . History of palpitations    2014-- low risk exercise tolerence test, no ischemi, PVC's (09-06-2012)  . HSV-1 (herpes simplex virus 1) infection    Cold sore  . Hx of colonic polyps   . Hyperlipidemia   . Insomnia   . Lactose intolerance   . Left nephrolithiasis 02/03/2018   Nonobstructing, noted on CT Abd/pelvis  . Morton's metatarsalgia, neuralgia, or neuroma, bilateral   . Osteoarthritis    knees and lumbar, feet  . Osteopenia   . Peripheral neuropathy    bilateral feet -- burning and stinging  . PONV (postoperative nausea and vomiting)    severe  . PVC's (premature ventricular contractions)   . Systolic murmur    "Slight"  . Vaginal cyst   . Wears glasses    Past Surgical History:  Procedure Laterality Date  . ANTERIOR CERVICAL DECOMP/DISCECTOMY FUSION  01/2014   C4 -- C6  . CATARACT EXTRACTION W/ INTRAOCULAR LENS  IMPLANT, BILATERAL  2015  . COLONOSCOPY  05/14/2015  . CYSTO WITH HYDRODISTENSION N/A 04/14/2018   Procedure: CYSTOSCOPY/HYDRODISTENSION WITH INSTILLATION OF PYRIDIUM AND MARCAINE, BLADDER BIOPSY WITH FULGERATION 0.5 TO 2 CM;  Surgeon: [name], MD;  Location: [facility];  Service: Urology;  Laterality: N/A;  . ELBOW DEBRIDEMENT Right 09/10/2010   and tendon debridement and radial tunnel release  . EXCISION MORTON'S NEUROMA Bilateral left 02-20-2009/  right & left 05-08-2009  . EXCISION VAGINAL CYST N/A 07/01/2016   Procedure:  EXCISION VAGINAL CYST;  Surgeon: Tyson Dense, MD;  [facility];  Service: Gynecology;  Laterality: N/A;  . PLANTAR FASCIA RELEASE Bilateral left 04-02-2010/  right 04-02-2016  . TONSILLECTOMY AND ADENOIDECTOMY  child  . TOTAL ABDOMINAL HYSTERECTOMY  1974   w/  Left salpingoophorectomy and Partial right salpingoophorectomy    Current Outpatient Medications  Medication Sig Dispense  Refill  . apixaban (ELIQUIS) 5 MG TABS tablet Take 1 tablet (5 mg total) by mouth 2 (two) times daily for 30 days. 60 tablet 6  . polyvinyl alcohol (LIQUIFILM TEARS) 1.4 % ophthalmic solution Place 1 drop into both eyes as needed for dry eyes.    Marland Kitchen sulfamethoxazole-trimethoprim (BACTRIM,SEPTRA) 400-80 MG tablet Take 1 tablet by mouth at bedtime. 5 tablet 0  . zolpidem (AMBIEN) 10 MG tablet Take 1 tablet (10 mg total) by mouth at bedtime. 30 tablet 3  . diazepam (VALIUM) 10 MG tablet Take 10 mg by mouth every 6 (six) hours as needed for anxiety. 3 times per week currently    . hydrOXYzine (ATARAX/VISTARIL) 10 MG tablet Take 10 mg by mouth 3 (three) times daily as needed for anxiety. 3 times per week currently     . meloxicam (MOBIC) 7.5 MG tablet Take 7.5 mg by mouth daily.    . Meth-Hyo-M Bl-Na Phos-Ph Sal (URIBEL) 118 MG CAPS Take 118 mg by mouth as needed (pressure/bladder pain). Uses 3 times per week currently     . metoprolol tartrate (LOPRESSOR) 25 MG tablet Take 0.5 tablets (12.5 mg total) by mouth 2 (two) times daily as needed (for elevated heart rate take 12.5mg , if your symptoms continue, you may take another 12.5mg  30 minutes later.). (Patient not taking: Reported on 04/17/2018) 30 tablet 0  . oxybutynin (DITROPAN) 5 MG tablet Take 1 tablet (5 mg total) by mouth every 8 (eight) hours as needed for bladder spasms. (Patient not taking: Reported on 04/17/2018) 30 tablet 0  . valACYclovir (VALTREX) 1000 MG tablet Take 1 tablet (1,000 mg total) by mouth daily. (Patient not taking: Reported on 04/06/2018) 30 tablet 6   No current facility-administered medications for this encounter.     Allergies  Allergen Reactions  . Amitriptyline     Bladder retention  . Codeine Nausea And Vomiting  . Nortriptyline     Bladder retention     Social History   Socioeconomic History  . Marital status: Divorced    Spouse name: Not on file  . Number of children: 1  . Years of education: 55  . Highest  education level: Not on file  Occupational History  . 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 Financial resource strain: Not on file  . Food insecurity:    Worry: Not on file    Inability: Not on file  . Transportation needs:    Medical: Not on file    Non-medical: Not on file  Tobacco Use  . Smoking status: Never Smoker  . Smokeless tobacco: Never Used  Substance and Sexual Activity  . Alcohol use: Yes    Comment: rare  . Drug use: No  . Sexual activity: Not on file  Lifestyle  . Physical activity:    Days per week: Not on file    Minutes per session: Not on file  . Stress: Not on file  Relationships  . Social connections:    Talks on phone: Not on file    Gets together: Not  on file    Attends religious service: Not on file    Active member of club or organization: Not on file    Attends meetings of clubs or organizations: Not on file    Relationship status: Not on file  . Intimate partner violence:    Fear of current or ex partner: Not on file    Emotionally abused: Not on file    Physically abused: Not on file    Forced sexual activity: Not on file  Other Topics Concern  . Not on file  Social History Narrative   Lives alone in a one story home.  Has one son.  Works as a Counsellor for [employer].  Education: some college.     Family History  Problem Relation Age of Onset  . Lung cancer Mother        lung  . Heart attack Father   . Memory loss Father   . Cancer Paternal Grandfather        colon  . Multiple sclerosis Brother    The patient does not have a history of early familial atrial fibrillation or other arrhythmias.  ROS- All systems are reviewed and negative except as per the HPI above.  Physical Exam: Vitals:   04/17/18 0839  BP: 114/68  Pulse: 63  Weight: 80.3 kg  Height: 5' 4.5" (1.638 m)    GEN- The patient is well appearing, alert and oriented x 3 today.   Head-  normocephalic, atraumatic Eyes-  Sclera clear, conjunctiva pink Ears- hearing intact Oropharynx- clear Neck- supple  Lungs- Clear to ausculation bilaterally, normal work of breathing Heart- Regular rate and rhythm, no murmurs, rubs or gallops  GI- soft, NT, ND, + BS Extremities- no clubbing, cyanosis, or edema MS- no significant deformity or atrophy Skin- no rash or lesion Psych- euthymic mood, full affect Neuro- strength and sensation are intact  Wt Readings from Last 3 Encounters:  04/17/18 80.3 kg  04/14/18 81.6 kg  07/14/17 81.3 kg    EKG today demonstrates SR HR 63, PR 152, QRS 80, QTc 407  Echo 07/18/17 demonstrated  Left ventricle: The cavity size was normal. Wall thickness was   normal. Systolic function was normal. The estimated ejection   fraction was in the range of 55% to 60%. Doppler parameters are   consistent with both elevated ventricular end-diastolic filling   pressure and elevated left atrial filling pressure. - Aortic valve: There was mild regurgitation. - Atrial septum: No defect or patent foramen ovale was identified. LA 74mm  Epic records are reviewed at length today  Assessment and Plan:  1. Paroxysmal atrial fibrillation The patient has Asymptomatic paroxysmal atrial fibrillation, possibly secondary to her procedure. She was completely unaware of her arrhythmia and so the duration of her afib is unclear. We had a long discussion about her afib and her stroke risk. General precautions with anticoagulation discussed. Continue Eliquis 5 mg BID Check Bmet/CBC in one month Metoprolol PRN for heart racing.  This patients CHA2DS2-VASc Score and unadjusted Ischemic Stroke Rate (% per year) is equal to 3.2 % stroke rate/year from a score of 3  Above score calculated as 1 point each if present [CHF, HTN, DM, Vascular=MI/PAD/Aortic Plaque, Age if 65-74, or Female] Above score calculated as 2 points each if present [Age > 75, or Stroke/TIA/TE]   2. Chest  Pain Patient had episode of chest pain with mostly typical symptoms. Given history of HLD and aortic plaque, will order myoview. Due to patient's  orthopedic foot issues, will order Lexiscan. She did have a low risk study in 2014.  3. Obesity Body mass index is 29.91 kg/m. Lifestyle modifications encouraged. She is trying to eat a more plant-based diet.  Follow up in afib clinic in one month. Follow up with Dr Debara Pickett pending results of Lexiscan.   Oliver Barre, [place] 04/17/2018 9:43 AM

## 2018-04-18 ENCOUNTER — Telehealth (HOSPITAL_COMMUNITY): Payer: Self-pay | Admitting: *Deleted

## 2018-04-18 DIAGNOSIS — R310 Gross hematuria: Secondary | ICD-10-CM | POA: Diagnosis not present

## 2018-04-18 NOTE — Telephone Encounter (Signed)
Pt cld today reporting faint blood in her urine.  No fever, no burning with urination.  Pt was concerned about bleeding since starting blood thinner.  The pt did just have cystoscopy to correct an ulcerated bladder.  Pt was advised to contact her Urologist and stay on blood thinner for stroke protection per Adline Peals, PA.  Pt was hesitant to continue the blood thinner, but it was explained the importance of protection from stroke.  Pt understood.

## 2018-04-19 DIAGNOSIS — R309 Painful micturition, unspecified: Secondary | ICD-10-CM | POA: Diagnosis not present

## 2018-04-19 DIAGNOSIS — N898 Other specified noninflammatory disorders of vagina: Secondary | ICD-10-CM | POA: Diagnosis not present

## 2018-04-19 DIAGNOSIS — N952 Postmenopausal atrophic vaginitis: Secondary | ICD-10-CM | POA: Diagnosis not present

## 2018-04-19 DIAGNOSIS — N3010 Interstitial cystitis (chronic) without hematuria: Secondary | ICD-10-CM | POA: Diagnosis not present

## 2018-04-24 ENCOUNTER — Encounter: Payer: Self-pay | Admitting: Family Medicine

## 2018-04-24 DIAGNOSIS — M5136 Other intervertebral disc degeneration, lumbar region: Secondary | ICD-10-CM | POA: Diagnosis not present

## 2018-04-24 DIAGNOSIS — M9903 Segmental and somatic dysfunction of lumbar region: Secondary | ICD-10-CM | POA: Diagnosis not present

## 2018-04-24 NOTE — Telephone Encounter (Signed)
Last OV 07/14/17 meloxicam last filled historical provider  Zolpidem last filled 01/03/18 #30 with 3 Per note states that this is limited to 5mg  due to pt age and being female.

## 2018-04-25 ENCOUNTER — Telehealth (HOSPITAL_COMMUNITY): Payer: Self-pay | Admitting: *Deleted

## 2018-04-25 MED ORDER — ZOLPIDEM TARTRATE 10 MG PO TABS: 10.0000 mg | ORAL_TABLET | Freq: Every day | ORAL | 3 refills | Status: DC

## 2018-04-25 MED ORDER — MELOXICAM 7.5 MG PO TABS: 7.5000 mg | ORAL_TABLET | Freq: Every day | ORAL | 3 refills | Status: DC

## 2018-04-25 NOTE — Telephone Encounter (Signed)
Patient given detailed instructions per Myocardial Perfusion Study Information Sheet for the test on 04/28/18 at 0745. Patient notified to arrive 15 minutes early and that it is imperative to arrive on time for appointment to keep from having the test rescheduled.  If you need to cancel or reschedule your appointment, please call the office within 24 hours of your appointment. . Patient verbalized understanding.Willisha Sligar, [place]anae [place]

## 2018-04-28 ENCOUNTER — Ambulatory Visit (HOSPITAL_COMMUNITY): Payer: Medicare Other | Attending: Internal Medicine

## 2018-04-28 DIAGNOSIS — R079 Chest pain, unspecified: Secondary | ICD-10-CM

## 2018-04-28 LAB — MYOCARDIAL PERFUSION IMAGING
CHL CUP NUCLEAR SSS: 0
LV dias vol: 76 mL (ref 46–106)
LV sys vol: 26 mL
Peak HR: 93 {beats}/min
Rest HR: 47 {beats}/min
SDS: 0
SRS: 0
TID: 0.96

## 2018-04-28 MED ORDER — REGADENOSON 0.4 MG/5ML IV SOLN
0.4000 mg | Freq: Once | INTRAVENOUS | Status: AC
  Administered 2018-04-28: 0.4 mg via INTRAVENOUS

## 2018-04-28 MED ORDER — TECHNETIUM TC 99M TETROFOSMIN IV KIT
31.2000 | PACK | Freq: Once | INTRAVENOUS | Status: AC | PRN
  Administered 2018-04-28: 31.2 via INTRAVENOUS
  Filled 2018-04-28: qty 32

## 2018-04-28 MED ORDER — TECHNETIUM TC 99M TETROFOSMIN IV KIT
10.2000 | PACK | Freq: Once | INTRAVENOUS | Status: AC | PRN
  Administered 2018-04-28: 10.2 via INTRAVENOUS
  Filled 2018-04-28: qty 11

## 2018-05-01 ENCOUNTER — Encounter (HOSPITAL_COMMUNITY): Payer: Self-pay | Admitting: *Deleted

## 2018-05-03 DIAGNOSIS — M9903 Segmental and somatic dysfunction of lumbar region: Secondary | ICD-10-CM | POA: Diagnosis not present

## 2018-05-03 DIAGNOSIS — M5136 Other intervertebral disc degeneration, lumbar region: Secondary | ICD-10-CM | POA: Diagnosis not present

## 2018-05-04 DIAGNOSIS — R3915 Urgency of urination: Secondary | ICD-10-CM | POA: Diagnosis not present

## 2018-05-04 DIAGNOSIS — R350 Frequency of micturition: Secondary | ICD-10-CM | POA: Diagnosis not present

## 2018-05-04 DIAGNOSIS — R102 Pelvic and perineal pain: Secondary | ICD-10-CM | POA: Diagnosis not present

## 2018-05-04 DIAGNOSIS — N3010 Interstitial cystitis (chronic) without hematuria: Secondary | ICD-10-CM | POA: Diagnosis not present

## 2018-05-09 ENCOUNTER — Other Ambulatory Visit: Payer: Self-pay | Admitting: Family Medicine

## 2018-05-12 DIAGNOSIS — R350 Frequency of micturition: Secondary | ICD-10-CM | POA: Diagnosis not present

## 2018-05-12 DIAGNOSIS — R3915 Urgency of urination: Secondary | ICD-10-CM | POA: Diagnosis not present

## 2018-05-17 DIAGNOSIS — R300 Dysuria: Secondary | ICD-10-CM | POA: Diagnosis not present

## 2018-05-17 DIAGNOSIS — R102 Pelvic and perineal pain: Secondary | ICD-10-CM | POA: Diagnosis not present

## 2018-05-17 DIAGNOSIS — M62838 Other muscle spasm: Secondary | ICD-10-CM | POA: Diagnosis not present

## 2018-05-17 DIAGNOSIS — M6281 Muscle weakness (generalized): Secondary | ICD-10-CM | POA: Diagnosis not present

## 2018-05-17 DIAGNOSIS — R350 Frequency of micturition: Secondary | ICD-10-CM | POA: Diagnosis not present

## 2018-05-17 DIAGNOSIS — R3982 Chronic bladder pain: Secondary | ICD-10-CM | POA: Diagnosis not present

## 2018-05-18 ENCOUNTER — Encounter: Payer: Self-pay | Admitting: Family Medicine

## 2018-05-18 ENCOUNTER — Encounter: Payer: Self-pay | Admitting: *Deleted

## 2018-05-18 ENCOUNTER — Other Ambulatory Visit: Payer: Self-pay

## 2018-05-18 ENCOUNTER — Ambulatory Visit (INDEPENDENT_AMBULATORY_CARE_PROVIDER_SITE_OTHER): Payer: Medicare Other | Admitting: Family Medicine

## 2018-05-18 VITALS — BP 121/81 | HR 69 | Temp 98.1°F | Resp 16 | Ht 64.0 in | Wt 175.1 lb

## 2018-05-18 DIAGNOSIS — E663 Overweight: Secondary | ICD-10-CM | POA: Insufficient documentation

## 2018-05-18 DIAGNOSIS — E669 Obesity, unspecified: Secondary | ICD-10-CM | POA: Diagnosis not present

## 2018-05-18 DIAGNOSIS — I4891 Unspecified atrial fibrillation: Secondary | ICD-10-CM | POA: Diagnosis not present

## 2018-05-18 DIAGNOSIS — E785 Hyperlipidemia, unspecified: Secondary | ICD-10-CM

## 2018-05-18 DIAGNOSIS — I480 Paroxysmal atrial fibrillation: Secondary | ICD-10-CM | POA: Insufficient documentation

## 2018-05-18 DIAGNOSIS — R3915 Urgency of urination: Secondary | ICD-10-CM | POA: Diagnosis not present

## 2018-05-18 DIAGNOSIS — R350 Frequency of micturition: Secondary | ICD-10-CM | POA: Diagnosis not present

## 2018-05-18 LAB — HEPATIC FUNCTION PANEL
ALT: 19 U/L (ref 0–35)
AST: 16 U/L (ref 0–37)
Albumin: 4.8 g/dL (ref 3.5–5.2)
Alkaline Phosphatase: 133 U/L — ABNORMAL HIGH (ref 39–117)
Bilirubin, Direct: 0.1 mg/dL (ref 0.0–0.3)
Total Bilirubin: 0.5 mg/dL (ref 0.2–1.2)
Total Protein: 7.6 g/dL (ref 6.0–8.3)

## 2018-05-18 LAB — LIPID PANEL
Cholesterol: 269 mg/dL — ABNORMAL HIGH (ref 0–200)
HDL: 62 mg/dL (ref >39.00)
LDL Cholesterol: 193 mg/dL — ABNORMAL HIGH (ref 0–99)
NONHDL: 207.42
Total CHOL/HDL Ratio: 4
Triglycerides: 74 mg/dL (ref 0.0–149.0)
VLDL: 14.8 mg/dL (ref 0.0–40.0)

## 2018-05-18 LAB — BASIC METABOLIC PANEL
BUN: 23 mg/dL (ref 6–23)
CO2: 28 mEq/L (ref 19–32)
Calcium: 10.1 mg/dL (ref 8.4–10.5)
Chloride: 104 mEq/L (ref 96–112)
Creatinine, Ser: 0.76 mg/dL (ref 0.40–1.20)
GFR: 75.71 mL/min (ref >60.00)
Glucose, Bld: 95 mg/dL (ref 70–99)
POTASSIUM: 4.3 meq/L (ref 3.5–5.1)
Sodium: 140 mEq/L (ref 135–145)

## 2018-05-18 LAB — CBC WITH DIFFERENTIAL/PLATELET
Basophils Absolute: 0.1 10*3/uL (ref 0.0–0.1)
Basophils Relative: 1 % (ref 0.0–3.0)
Eosinophils Absolute: 0.2 10*3/uL (ref 0.0–0.7)
Eosinophils Relative: 3.1 % (ref 0.0–5.0)
HCT: 48.7 % — ABNORMAL HIGH (ref 36.0–46.0)
Hemoglobin: 16.2 g/dL — ABNORMAL HIGH (ref 12.0–15.0)
Lymphocytes Relative: 33.7 % (ref 12.0–46.0)
Lymphs Abs: 2.2 10*3/uL (ref 0.7–4.0)
MCHC: 33.2 g/dL (ref 30.0–36.0)
MCV: 86.2 fl (ref 78.0–100.0)
Monocytes Absolute: 0.4 10*3/uL (ref 0.1–1.0)
Monocytes Relative: 5.6 % (ref 3.0–12.0)
Neutro Abs: 3.6 10*3/uL (ref 1.4–7.7)
Neutrophils Relative %: 56.6 % (ref 43.0–77.0)
Platelets: 316 10*3/uL (ref 150.0–400.0)
RBC: 5.66 Mil/uL — ABNORMAL HIGH (ref 3.87–5.11)
RDW: 14.8 % (ref 11.5–15.5)
WBC: 6.4 10*3/uL (ref 4.0–10.5)

## 2018-05-18 LAB — TSH: TSH: 1.7 u[IU]/mL (ref 0.35–4.50)

## 2018-05-18 NOTE — Progress Notes (Signed)
   Subjective:    Patient ID: Lorraine [name], female    DOB: Jan 24, 1951, 68 y.o.   MRN: [id number]  HPI Hyperlipidemia- ongoing issue for pt.  Not currently on medication.  Last LDL was 177.  Pt reports she has been intolerant of statins in the past.  No abd pain, N/V.  Afib- new dx for pt.  Now following w/ cards.  On Eliquis.  Has Metoprolol to use PRN palpitations.  No CP, SOB, palpitations.  No swelling of hands/feet.  Obesity- pt is down 4 lbs since her visit 10 months ago.  BMI is 30.06  Pt has been following a plant based diet.  No regular exercise.  Review of Systems For ROS see HPI     Objective:   Physical Exam Vitals signs reviewed.  Constitutional:      General: She is not in acute distress.    Appearance: Normal appearance. She is well-developed.  HENT:     Head: Normocephalic and atraumatic.  Eyes:     Conjunctiva/sclera: Conjunctivae normal.     Pupils: Pupils are equal, round, and reactive to light.  Neck:     Musculoskeletal: Normal range of motion and neck supple.     Thyroid: No thyromegaly.  Cardiovascular:     Rate and Rhythm: Normal rate. Rhythm irregular.     Heart sounds: Normal heart sounds. No murmur.  Pulmonary:     Effort: Pulmonary effort is normal. No respiratory distress.     Breath sounds: Normal breath sounds.  Abdominal:     General: There is no distension.     Palpations: Abdomen is soft.     Tenderness: There is no abdominal tenderness.  Lymphadenopathy:     Cervical: No cervical adenopathy.  Skin:    General: Skin is warm and dry.  Neurological:     Mental Status: She is alert and oriented to person, place, and time.  Psychiatric:        Behavior: Behavior normal.           Assessment & Plan:

## 2018-05-18 NOTE — Assessment & Plan Note (Addendum)
New dx for pt.  She is following w/ Cards and now on Metoprolol PRN and Eliquis daily.  Currently asymptomatic.

## 2018-05-18 NOTE — Assessment & Plan Note (Signed)
Chronic problem.  Pt has been intolerant to statins in the past.  Did not start Zetia out of fear of side effects.  Has changed diet.  Check labs and start meds prn.

## 2018-05-18 NOTE — Patient Instructions (Signed)
Follow up in 6 months to recheck cholesterol We'll notify you of your lab results and make any changes if needed Continue to work on healthy diet and regular exercise- you can do it! Call with any questions or concerns [place]!!

## 2018-05-18 NOTE — Assessment & Plan Note (Signed)
Ongoing issue for pt.  She is down 4 lbs since last visit.  Encouraged healthy diet and regular exercise.  Check labs to risk stratify.

## 2018-05-19 ENCOUNTER — Other Ambulatory Visit: Payer: Self-pay | Admitting: *Deleted

## 2018-05-19 MED ORDER — EZETIMIBE 10 MG PO TABS: 10.0000 mg | ORAL_TABLET | Freq: Every day | ORAL | 3 refills | Status: DC

## 2018-05-22 DIAGNOSIS — R300 Dysuria: Secondary | ICD-10-CM | POA: Diagnosis not present

## 2018-05-22 DIAGNOSIS — M62838 Other muscle spasm: Secondary | ICD-10-CM | POA: Diagnosis not present

## 2018-05-22 DIAGNOSIS — R3982 Chronic bladder pain: Secondary | ICD-10-CM | POA: Diagnosis not present

## 2018-05-22 DIAGNOSIS — R102 Pelvic and perineal pain: Secondary | ICD-10-CM | POA: Diagnosis not present

## 2018-05-22 DIAGNOSIS — M6281 Muscle weakness (generalized): Secondary | ICD-10-CM | POA: Diagnosis not present

## 2018-05-22 DIAGNOSIS — R350 Frequency of micturition: Secondary | ICD-10-CM | POA: Diagnosis not present

## 2018-05-22 NOTE — Progress Notes (Signed)
Primary Care Physician: [name], MD Primary Cardiologist: Dr Debara Pickett Referring Physician: [facility]   Lorraine [name] is a 68 y.o. female with a history of paroxysmal atrial fibrillation who presents for consultation in the [facility].  The patient was initially diagnosed with atrial fibrillation 04/14/18 after having a cystoscopy she developed atrial fibrillation with RVR HR 130s in PACU. She was asymptomatic during this episode. She was given 15 mg diltiazem and converted to NSR. She was then started on Eliquis and PRN metoprolol.   She reports that she had one episode of heart racing since we saw her last. She took her pulse with her BP machine which showed rates between 107-130. She did have a sinking feeling in her chest. This resolved within two hours, she did not take her PRN BB. She had an additional episode while shopping which she reports lasted about one minute. She admits that she is very anxious about her health.  Today, she denies symptoms of shortness of breath, orthopnea, PND, lower extremity edema, dizziness, presyncope, syncope, snoring, daytime somnolence, bleeding, or neurologic sequela. The patient is tolerating medications without difficulties and is otherwise without complaint today.    Atrial Fibrillation Risk Factors:  she does not have symptoms or diagnosis of sleep apnea. she does not have a history of rheumatic fever. she does not have a history of alcohol use. she has a BMI of Body mass index is 30.21 kg/m.Marland Kitchen Filed Weights   05/23/18 0846  Weight: 79.8 kg     Atrial Fibrillation Management history:  Previous antiarrhythmic drugs: none Previous cardioversions: none Previous ablations: none CHADS2VASC score: 3 (female, age, aortic plaque) Anticoagulation history: Eliquis   Past Medical History:  Diagnosis Date  . Anxiety   . Aortic atherosclerosis ([place]) 02/03/2018   Noted on CT Abd/Pelvis  . AR (aortic  regurgitation) 07/18/2017   Mild, noted on ECHO  . Bilateral cataracts   . Bilateral plantar fasciitis   . Chronic knee pain   . Chronic low back pain   . Chronic lumbar radiculopathy 07/14/2017   Mild, L5, noted on electromyography  . History of palpitations    2014-- low risk exercise tolerence test, no ischemi, PVC's (09-06-2012)  . HSV-1 (herpes simplex virus 1) infection    Cold sore  . Hx of colonic polyps   . Hyperlipidemia   . Insomnia   . Lactose intolerance   . Left nephrolithiasis 02/03/2018   Nonobstructing, noted on CT Abd/pelvis  . Morton's metatarsalgia, neuralgia, or neuroma, bilateral   . Osteoarthritis    knees and lumbar, feet  . Osteopenia   . Peripheral neuropathy    bilateral feet -- burning and stinging  . PONV (postoperative nausea and vomiting)    severe  . PVC's (premature ventricular contractions)   . Systolic murmur    "Slight"  . Vaginal cyst   . Wears glasses    Past Surgical History:  Procedure Laterality Date  . ANTERIOR CERVICAL DECOMP/DISCECTOMY FUSION  01/2014   C4 -- C6  . CATARACT EXTRACTION W/ INTRAOCULAR LENS  IMPLANT, BILATERAL  2015  . COLONOSCOPY  05/14/2015  . CYSTO WITH HYDRODISTENSION N/A 04/14/2018   Procedure: CYSTOSCOPY/HYDRODISTENSION WITH INSTILLATION OF PYRIDIUM AND MARCAINE, BLADDER BIOPSY WITH FULGERATION 0.5 TO 2 CM;  Surgeon: [name], MD;  Location: [facility];  Service: Urology;  Laterality: N/A;  . ELBOW DEBRIDEMENT Right 09/10/2010   and tendon debridement and radial tunnel release  .  EXCISION MORTON'S NEUROMA Bilateral left 02-20-2009/  right & left 05-08-2009  . EXCISION VAGINAL CYST N/A 07/01/2016   Procedure: EXCISION VAGINAL CYST;  Surgeon: Tyson Dense, MD;  Location: [facility];  Service: Gynecology;  Laterality: N/A;  . PLANTAR FASCIA RELEASE Bilateral left 04-02-2010/  right 04-02-2016  . TONSILLECTOMY AND ADENOIDECTOMY  child  . TOTAL ABDOMINAL  HYSTERECTOMY  1974   w/  Left salpingoophorectomy and Partial right salpingoophorectomy    Current Outpatient Medications  Medication Sig Dispense Refill  . apixaban (ELIQUIS) 5 MG TABS tablet Take 1 tablet (5 mg total) by mouth 2 (two) times daily for 30 days. 60 tablet 6  . ezetimibe (ZETIA) 10 MG tablet Take 1 tablet (10 mg total) by mouth daily. 90 tablet 3  . polyvinyl alcohol (LIQUIFILM TEARS) 1.4 % ophthalmic solution Place 1 drop into both eyes as needed for dry eyes.    Marland Kitchen zolpidem (AMBIEN) 10 MG tablet Take 1 tablet (10 mg total) by mouth at bedtime. 30 tablet 3  . diazepam (VALIUM) 10 MG tablet Take 10 mg by mouth every 6 (six) hours as needed for anxiety. 3 times per week currently    . hydrOXYzine (ATARAX/VISTARIL) 10 MG tablet Take 10 mg by mouth 3 (three) times daily as needed for anxiety. 3 times per week currently     . meloxicam (MOBIC) 15 MG tablet TAKE 1 TABLET EVERY DAY (Patient not taking: Reported on 05/23/2018) 30 tablet 0  . metoprolol tartrate (LOPRESSOR) 25 MG tablet Take 0.5 tablets (12.5 mg total) by mouth 2 (two) times daily as needed (for elevated heart rate take 12.5mg , if your symptoms continue, you may take another 12.5mg  30 minutes later.). (Patient not taking: Reported on 05/23/2018) 30 tablet 0   No current facility-administered medications for this encounter.     Allergies  Allergen Reactions  . Amitriptyline     Bladder retention  . Codeine Nausea And Vomiting  . Nortriptyline     Bladder retention     Social History   Socioeconomic History  . Marital status: Divorced    Spouse name: Not on file  . Number of children: 1  . Years of education: 84  . Highest education level: Not on file  Occupational History  . 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 Financial resource strain: Not on file  . Food insecurity:    Worry: Not on file    Inability: Not on file  . Transportation needs:     Medical: Not on file    Non-medical: Not on file  Tobacco Use  . Smoking status: Never Smoker  . Smokeless tobacco: Never Used  Substance and Sexual Activity  . Alcohol use: Yes    Comment: rare  . Drug use: No  . Sexual activity: Not on file  Lifestyle  . Physical activity:    Days per week: Not on file    Minutes per session: Not on file  . Stress: Not on file  Relationships  . Social connections:    Talks on phone: Not on file    Gets together: Not on file    Attends religious service: Not on file    Active member of club or organization: Not on file    Attends meetings of clubs or organizations: Not on file    Relationship status: Not on file  . Intimate partner violence:    Fear of current or ex partner:  Not on file    Emotionally abused: Not on file    Physically abused: Not on file    Forced sexual activity: Not on file  Other Topics Concern  . Not on file  Social History Narrative   Lives alone in a one story home.  Has one son.  Works as a Counsellor for [employer].  Education: some college.     Family History  Problem Relation Age of Onset  . Lung cancer Mother        lung  . Heart attack Father   . Memory loss Father   . Cancer Paternal Grandfather        colon  . Multiple sclerosis Brother    The patient does not have a history of early familial atrial fibrillation or other arrhythmias.  ROS- All systems are reviewed and negative except as per the HPI above.  Physical Exam: Vitals:   05/23/18 0846  BP: 124/78  Pulse: (!) 53  Weight: 79.8 kg  Height: 5\' 4"  (1.626 m)    GEN- The patient is well appearing, alert and oriented x 3 today.   HEENT-head normocephalic, atraumatic, sclera clear, conjunctiva pink, hearing intact, trachea midline. Lungs- Clear to ausculation bilaterally, normal work of breathing Heart- Regular rate and rhythm, no murmurs, rubs or gallops  GI- soft, NT, ND, + BS Extremities- no clubbing, cyanosis, or edema MS-  no significant deformity or atrophy Skin- no rash or lesion Psych- euthymic mood, full affect Neuro- strength and sensation are intact   Wt Readings from Last 3 Encounters:  05/23/18 79.8 kg  05/18/18 79.4 kg  04/17/18 80.3 kg    EKG today demonstrates sinus bradycardia, HR 53, PR 172, QRS 86, QTc 409  Echo 07/18/17 demonstrated  Left ventricle: The cavity size was normal. Wall thickness was   normal. Systolic function was normal. The estimated ejection   fraction was in the range of 55% to 60%. Doppler parameters are   consistent with both elevated ventricular end-diastolic filling   pressure and elevated left atrial filling pressure. - Aortic valve: There was mild regurgitation. - Atrial septum: No defect or patent foramen ovale was identified. LA 41mm  Epic records are reviewed at length today  Assessment and Plan:  1. Paroxysmal atrial fibrillation The patient has paroxysmal atrial fibrillation. She has had two episodes in the last month. Longest was two hours. We had a long discussion about therapies for atrial fibrillation. She is hesitant about medicines in general and did not want to start AAD at this time which I agree with. Of note, she has a diagnosis of anxiety and admits she is quite anxious often. Encouraged her to discuss with her PCP. Will order heart monitor to see if her symptoms correlate with her arrhythmia. Continue Eliquis 5 mg BID Recent Bmet/CBC at PCP noted, stable. Metoprolol PRN for heart racing.  This patients CHA2DS2-VASc Score and unadjusted Ischemic Stroke Rate (% per year) is equal to 3.2 % stroke rate/year from a score of 3  Above score calculated as 1 point each if present [CHF, HTN, DM, Vascular=MI/PAD/Aortic Plaque, Age if 65-74, or Female] Above score calculated as 2 points each if present [Age > 75, or Stroke/TIA/TE]   2. Chest Pain Patient's recent stress test read as low risk. No further episodes of chest pain.  3. Obesity Body  mass index is 30.21 kg/m.  Lifestyle modifications encouraged including regular physical activity and weight loss.  4. HLD Now on Zetia. Plans per  PCP.  Follow up in the Afib clinic in 3 months.  [facility] [address] [place], [place] [address] [phone]

## 2018-05-23 ENCOUNTER — Encounter (HOSPITAL_COMMUNITY): Payer: Self-pay | Admitting: Physician Assistant

## 2018-05-23 ENCOUNTER — Ambulatory Visit (HOSPITAL_COMMUNITY)
Admission: RE | Admit: 2018-05-23 | Discharge: 2018-05-23 | Disposition: A | Discharge status: Home / Self Care | Payer: Medicare Other | Source: Ambulatory Visit | Attending: Physician Assistant | Admitting: Physician Assistant

## 2018-05-23 VITALS — BP 124/78 | HR 53 | Ht 64.0 in | Wt 176.0 lb

## 2018-05-23 DIAGNOSIS — Z6830 Body mass index (BMI) 30.0-30.9, adult: Secondary | ICD-10-CM | POA: Insufficient documentation

## 2018-05-23 DIAGNOSIS — I110 Hypertensive heart disease with heart failure: Secondary | ICD-10-CM | POA: Diagnosis not present

## 2018-05-23 DIAGNOSIS — E785 Hyperlipidemia, unspecified: Secondary | ICD-10-CM | POA: Insufficient documentation

## 2018-05-23 DIAGNOSIS — Z7901 Long term (current) use of anticoagulants: Secondary | ICD-10-CM | POA: Diagnosis not present

## 2018-05-23 DIAGNOSIS — I480 Paroxysmal atrial fibrillation: Secondary | ICD-10-CM | POA: Diagnosis not present

## 2018-05-23 DIAGNOSIS — M1990 Unspecified osteoarthritis, unspecified site: Secondary | ICD-10-CM | POA: Diagnosis not present

## 2018-05-23 DIAGNOSIS — F419 Anxiety disorder, unspecified: Secondary | ICD-10-CM | POA: Insufficient documentation

## 2018-05-23 DIAGNOSIS — R011 Cardiac murmur, unspecified: Secondary | ICD-10-CM | POA: Diagnosis not present

## 2018-05-23 DIAGNOSIS — R079 Chest pain, unspecified: Secondary | ICD-10-CM | POA: Insufficient documentation

## 2018-05-23 DIAGNOSIS — Z79899 Other long term (current) drug therapy: Secondary | ICD-10-CM | POA: Diagnosis not present

## 2018-05-23 DIAGNOSIS — M25569 Pain in unspecified knee: Secondary | ICD-10-CM | POA: Diagnosis not present

## 2018-05-23 DIAGNOSIS — E669 Obesity, unspecified: Secondary | ICD-10-CM | POA: Insufficient documentation

## 2018-05-23 DIAGNOSIS — M545 Low back pain: Secondary | ICD-10-CM | POA: Insufficient documentation

## 2018-05-23 DIAGNOSIS — G4700 Insomnia, unspecified: Secondary | ICD-10-CM | POA: Diagnosis not present

## 2018-05-23 DIAGNOSIS — Z791 Long term (current) use of non-steroidal anti-inflammatories (NSAID): Secondary | ICD-10-CM | POA: Insufficient documentation

## 2018-05-23 DIAGNOSIS — M8580 Other specified disorders of bone density and structure, unspecified site: Secondary | ICD-10-CM | POA: Insufficient documentation

## 2018-05-23 DIAGNOSIS — E1140 Type 2 diabetes mellitus with diabetic neuropathy, unspecified: Secondary | ICD-10-CM | POA: Diagnosis not present

## 2018-05-23 NOTE — Patient Instructions (Signed)
Please talk with your primary care regarding your anxiety.  You can use your metoprolol as needed for episodes of atrial fibrillation.  Take off your monitor in 14 days.

## 2018-05-26 DIAGNOSIS — R3915 Urgency of urination: Secondary | ICD-10-CM | POA: Diagnosis not present

## 2018-05-26 DIAGNOSIS — R350 Frequency of micturition: Secondary | ICD-10-CM | POA: Diagnosis not present

## 2018-05-31 ENCOUNTER — Other Ambulatory Visit: Payer: Self-pay

## 2018-05-31 ENCOUNTER — Ambulatory Visit (INDEPENDENT_AMBULATORY_CARE_PROVIDER_SITE_OTHER): Payer: Medicare Other | Admitting: Family Medicine

## 2018-05-31 ENCOUNTER — Encounter: Payer: Self-pay | Admitting: Family Medicine

## 2018-05-31 VITALS — BP 119/64 | HR 78 | Temp 98.4°F | Resp 16 | Ht 64.0 in | Wt 178.4 lb

## 2018-05-31 DIAGNOSIS — R4589 Other symptoms and signs involving emotional state: Secondary | ICD-10-CM | POA: Insufficient documentation

## 2018-05-31 DIAGNOSIS — F418 Other specified anxiety disorders: Secondary | ICD-10-CM | POA: Diagnosis not present

## 2018-05-31 MED ORDER — BUSPIRONE HCL 5 MG PO TABS: 5.0000 mg | ORAL_TABLET | Freq: Two times a day (BID) | ORAL | 3 refills | Status: DC

## 2018-05-31 MED ORDER — ALPRAZOLAM 0.5 MG PO TABS: 0.5000 mg | ORAL_TABLET | Freq: Two times a day (BID) | ORAL | 1 refills | Status: DC | PRN

## 2018-05-31 NOTE — Progress Notes (Signed)
   Subjective:    Patient ID: Lorraine [name], female    DOB: 12-27-1950, 68 y.o.   MRN: [id number]  HPI Anxiety- pt is having a lot of anxiety over her multitude of health issues.  She has had heart issues, bladder issues.  Describes a 'squeezing' in her chest.  'heart feels like it's pounding'.  Pt was previously on Alprazolam.  She had also tried 'multiple depression medicines and it was bad'.  Has intravaginal diazepam for pelvic floor spasms.  Did take a 1/2 pill orally a few days ago for anxiety- did not feel groggy or sleepy.   Review of Systems For ROS see HPI     Objective:   Physical Exam Vitals signs reviewed.  Constitutional:      Appearance: Normal appearance.  Neurological:     General: No focal deficit present.     Mental Status: She is alert and oriented to person, place, and time.  Psychiatric:        Mood and Affect: Mood normal.        Behavior: Behavior normal.        Thought Content: Thought content normal.           Assessment & Plan:

## 2018-05-31 NOTE — Assessment & Plan Note (Signed)
New.  Pt has had a lot of health issues recently and is understandably concerned.  She did not tolerate SSRIs previously.  Will start Buspar and Alprazolam as a rescue medication.  Encouraged stress management.  Will follow closely.

## 2018-05-31 NOTE — Patient Instructions (Signed)
Follow up in 1 month to recheck anxiety START the Buspirone once daily- try and take twice daily if you can remember USE the Alprazolam as needed for those panicked moments Continue to work on stress management- especially in this difficult time Call with any questions or concerns Hang in there!!!

## 2018-06-01 DIAGNOSIS — H35363 Drusen (degenerative) of macula, bilateral: Secondary | ICD-10-CM | POA: Diagnosis not present

## 2018-06-01 DIAGNOSIS — R3915 Urgency of urination: Secondary | ICD-10-CM | POA: Diagnosis not present

## 2018-06-01 DIAGNOSIS — R350 Frequency of micturition: Secondary | ICD-10-CM | POA: Diagnosis not present

## 2018-06-05 DIAGNOSIS — I480 Paroxysmal atrial fibrillation: Secondary | ICD-10-CM | POA: Diagnosis not present

## 2018-06-06 ENCOUNTER — Encounter: Payer: Self-pay | Admitting: Family Medicine

## 2018-06-09 DIAGNOSIS — R350 Frequency of micturition: Secondary | ICD-10-CM | POA: Diagnosis not present

## 2018-06-09 DIAGNOSIS — R3915 Urgency of urination: Secondary | ICD-10-CM | POA: Diagnosis not present

## 2018-06-13 ENCOUNTER — Other Ambulatory Visit (HOSPITAL_COMMUNITY): Payer: Self-pay | Admitting: *Deleted

## 2018-06-13 ENCOUNTER — Encounter: Payer: Self-pay | Admitting: Family Medicine

## 2018-06-13 ENCOUNTER — Telehealth (HOSPITAL_COMMUNITY): Payer: Self-pay | Admitting: *Deleted

## 2018-06-13 MED ORDER — METOPROLOL TARTRATE 25 MG PO TABS: ORAL_TABLET | ORAL | 3 refills | Status: DC

## 2018-06-13 NOTE — Telephone Encounter (Signed)
Called to discuss zio patch results with patient - pt states her episodes have decreased and she is feeling much better. She feels as though she can keep her appt with Dr.Tabori on 4/16 as scheduled. Pt does endorse she stopped Eliquis about a week ago due to weakness within 24 hours of stopping the drug she was back to herself. She does not feel 4% burden of afib warrants her restarting a medication that makes her feel terrible. Educated pt on CHADVASC score of 3 per Adline Peals note that recommends continued therapy. Pt states she is sensitive to medication and will call if her AF episodes increase and readdress need for DOAC.  Offered medication change to differnet DOAC but again pt states she will call back if episodes increase. For now she will use metoprolol PRN for breakthrough afib and keep her scheduled follow up with Dr. Birdie Riddle.

## 2018-06-14 DIAGNOSIS — R102 Pelvic and perineal pain: Secondary | ICD-10-CM | POA: Diagnosis not present

## 2018-06-14 DIAGNOSIS — R3982 Chronic bladder pain: Secondary | ICD-10-CM | POA: Diagnosis not present

## 2018-06-14 DIAGNOSIS — M62838 Other muscle spasm: Secondary | ICD-10-CM | POA: Diagnosis not present

## 2018-06-14 DIAGNOSIS — M6281 Muscle weakness (generalized): Secondary | ICD-10-CM | POA: Diagnosis not present

## 2018-06-14 DIAGNOSIS — R300 Dysuria: Secondary | ICD-10-CM | POA: Diagnosis not present

## 2018-06-15 DIAGNOSIS — R3915 Urgency of urination: Secondary | ICD-10-CM | POA: Diagnosis not present

## 2018-06-15 DIAGNOSIS — R350 Frequency of micturition: Secondary | ICD-10-CM | POA: Diagnosis not present

## 2018-06-18 ENCOUNTER — Other Ambulatory Visit: Payer: Self-pay | Admitting: Family Medicine

## 2018-06-19 DIAGNOSIS — M6281 Muscle weakness (generalized): Secondary | ICD-10-CM | POA: Diagnosis not present

## 2018-06-19 DIAGNOSIS — R300 Dysuria: Secondary | ICD-10-CM | POA: Diagnosis not present

## 2018-06-19 DIAGNOSIS — R102 Pelvic and perineal pain: Secondary | ICD-10-CM | POA: Diagnosis not present

## 2018-06-19 DIAGNOSIS — R350 Frequency of micturition: Secondary | ICD-10-CM | POA: Diagnosis not present

## 2018-06-19 DIAGNOSIS — M62838 Other muscle spasm: Secondary | ICD-10-CM | POA: Diagnosis not present

## 2018-06-20 DIAGNOSIS — R3915 Urgency of urination: Secondary | ICD-10-CM | POA: Diagnosis not present

## 2018-06-20 DIAGNOSIS — R350 Frequency of micturition: Secondary | ICD-10-CM | POA: Diagnosis not present

## 2018-06-28 DIAGNOSIS — M8588 Other specified disorders of bone density and structure, other site: Secondary | ICD-10-CM | POA: Diagnosis not present

## 2018-06-28 DIAGNOSIS — Z6830 Body mass index (BMI) 30.0-30.9, adult: Secondary | ICD-10-CM | POA: Diagnosis not present

## 2018-06-28 DIAGNOSIS — N958 Other specified menopausal and perimenopausal disorders: Secondary | ICD-10-CM | POA: Diagnosis not present

## 2018-06-28 DIAGNOSIS — Z1231 Encounter for screening mammogram for malignant neoplasm of breast: Secondary | ICD-10-CM | POA: Diagnosis not present

## 2018-06-28 DIAGNOSIS — Z01419 Encounter for gynecological examination (general) (routine) without abnormal findings: Secondary | ICD-10-CM | POA: Diagnosis not present

## 2018-06-29 ENCOUNTER — Encounter: Payer: Self-pay | Admitting: Family Medicine

## 2018-06-29 ENCOUNTER — Ambulatory Visit (INDEPENDENT_AMBULATORY_CARE_PROVIDER_SITE_OTHER): Payer: Medicare Other | Admitting: Family Medicine

## 2018-06-29 VITALS — BP 138/76 | HR 55 | Temp 97.0°F | Ht 64.0 in | Wt 177.0 lb

## 2018-06-29 DIAGNOSIS — F418 Other specified anxiety disorders: Secondary | ICD-10-CM | POA: Diagnosis not present

## 2018-06-29 DIAGNOSIS — R350 Frequency of micturition: Secondary | ICD-10-CM | POA: Diagnosis not present

## 2018-06-29 DIAGNOSIS — R3915 Urgency of urination: Secondary | ICD-10-CM | POA: Diagnosis not present

## 2018-06-29 NOTE — Progress Notes (Signed)
Virtual Visit via Telephone Note  I connected with Lorraine [name] on 06/29/18 at 11:20 AM EDT by telephone and verified that I am speaking with the correct person using two identifiers.   I discussed the limitations, risks, security and privacy concerns of performing an evaluation and management service by telephone and the availability of in person appointments. I also discussed with the patient that there may be a patient responsible charge related to this service. The patient expressed understanding and agreed to proceed.  She is at home while I am in the office  History of Present Illness: Anxiety- pt was started on Buspar at last visit 3/18 w/ alprazolam as a rescue.  She reports that some days she only takes medication once daily.  Has only required Alprazolam 'a couple of times'.  No longer feels that chest is being squeezed.  Continues to have some issues w/ sleep.  Was wondering about switching to Alprazolam and stopping Buspar.   Observations/Objective: Pt is able to speak clearly, coherently without shortness of breath or increased work of breathing.  Thought process is linear.  Mood is appropriate.   Assessment and Plan: Anxiety- improving w/ addition of Buspar at last visit.  Had discussion w/ pt that switching to Alprazolam for daily use is not the answer.  Will increase Buspar to 7.5mg  1-2x/day for anxiety.  Pt expressed understanding and is in agreement w/ plan.   Follow Up Instructions: Pt will send MyChart to let me know how increased dose is working   I discussed the assessment and treatment plan with the patient. The patient was provided an opportunity to ask questions and all were answered. The patient agreed with the plan and demonstrated an understanding of the instructions.   The patient was advised to call back or seek an in-person evaluation if the symptoms worsen or if the condition fails to improve as anticipated.  I provided 12 minutes of non-face-to-face time  during this encounter.   Annye Asa, MD

## 2018-06-29 NOTE — Progress Notes (Signed)
I have discussed the procedure for the virtual visit with the patient who has given consent to proceed with assessment and treatment.   BETHANY DILLARD, CMA     

## 2018-07-03 DIAGNOSIS — M6281 Muscle weakness (generalized): Secondary | ICD-10-CM | POA: Diagnosis not present

## 2018-07-03 DIAGNOSIS — M62838 Other muscle spasm: Secondary | ICD-10-CM | POA: Diagnosis not present

## 2018-07-03 DIAGNOSIS — R3915 Urgency of urination: Secondary | ICD-10-CM | POA: Diagnosis not present

## 2018-07-03 DIAGNOSIS — R102 Pelvic and perineal pain: Secondary | ICD-10-CM | POA: Diagnosis not present

## 2018-07-05 ENCOUNTER — Encounter: Payer: Self-pay | Admitting: Family Medicine

## 2018-07-06 MED ORDER — BUSPIRONE HCL 7.5 MG PO TABS: 7.5000 mg | ORAL_TABLET | Freq: Two times a day (BID) | ORAL | 3 refills | Status: DC

## 2018-07-07 ENCOUNTER — Encounter: Payer: Medicare Other | Admitting: Family Medicine

## 2018-07-07 DIAGNOSIS — R350 Frequency of micturition: Secondary | ICD-10-CM | POA: Diagnosis not present

## 2018-07-07 DIAGNOSIS — R3915 Urgency of urination: Secondary | ICD-10-CM | POA: Diagnosis not present

## 2018-07-10 ENCOUNTER — Other Ambulatory Visit: Payer: Self-pay | Admitting: Obstetrics and Gynecology

## 2018-07-10 DIAGNOSIS — R921 Mammographic calcification found on diagnostic imaging of breast: Secondary | ICD-10-CM

## 2018-07-11 ENCOUNTER — Other Ambulatory Visit: Payer: Self-pay

## 2018-07-11 ENCOUNTER — Other Ambulatory Visit: Payer: Self-pay | Admitting: Obstetrics and Gynecology

## 2018-07-11 ENCOUNTER — Ambulatory Visit
Admission: RE | Admit: 2018-07-11 | Discharge: 2018-07-11 | Disposition: A | Discharge status: Home / Self Care | Payer: Medicare Other | Source: Ambulatory Visit | Attending: Obstetrics and Gynecology | Admitting: Obstetrics and Gynecology

## 2018-07-11 DIAGNOSIS — R921 Mammographic calcification found on diagnostic imaging of breast: Secondary | ICD-10-CM

## 2018-07-13 DIAGNOSIS — R3915 Urgency of urination: Secondary | ICD-10-CM | POA: Diagnosis not present

## 2018-07-16 ENCOUNTER — Other Ambulatory Visit: Payer: Self-pay | Admitting: Family Medicine

## 2018-07-17 ENCOUNTER — Ambulatory Visit
Admission: RE | Admit: 2018-07-17 | Discharge: 2018-07-17 | Disposition: A | Discharge status: Home / Self Care | Payer: Medicare Other | Source: Ambulatory Visit | Attending: Obstetrics and Gynecology | Admitting: Obstetrics and Gynecology

## 2018-07-17 ENCOUNTER — Other Ambulatory Visit: Payer: Self-pay

## 2018-07-17 DIAGNOSIS — R921 Mammographic calcification found on diagnostic imaging of breast: Secondary | ICD-10-CM

## 2018-07-17 DIAGNOSIS — R102 Pelvic and perineal pain: Secondary | ICD-10-CM | POA: Diagnosis not present

## 2018-07-17 DIAGNOSIS — D0512 Intraductal carcinoma in situ of left breast: Secondary | ICD-10-CM | POA: Diagnosis not present

## 2018-07-17 DIAGNOSIS — R350 Frequency of micturition: Secondary | ICD-10-CM | POA: Diagnosis not present

## 2018-07-17 DIAGNOSIS — N6324 Unspecified lump in the left breast, lower inner quadrant: Secondary | ICD-10-CM | POA: Diagnosis not present

## 2018-07-17 DIAGNOSIS — M62838 Other muscle spasm: Secondary | ICD-10-CM | POA: Diagnosis not present

## 2018-07-17 DIAGNOSIS — R3982 Chronic bladder pain: Secondary | ICD-10-CM | POA: Diagnosis not present

## 2018-07-17 DIAGNOSIS — M6281 Muscle weakness (generalized): Secondary | ICD-10-CM | POA: Diagnosis not present

## 2018-07-17 HISTORY — PX: BREAST BIOPSY: SHX20

## 2018-07-19 ENCOUNTER — Encounter: Payer: Self-pay | Admitting: Family Medicine

## 2018-07-21 DIAGNOSIS — R3915 Urgency of urination: Secondary | ICD-10-CM | POA: Diagnosis not present

## 2018-07-26 ENCOUNTER — Encounter: Payer: Self-pay | Admitting: Adult Health

## 2018-07-26 DIAGNOSIS — C50312 Malignant neoplasm of lower-inner quadrant of left female breast: Secondary | ICD-10-CM | POA: Insufficient documentation

## 2018-07-26 DIAGNOSIS — D0512 Intraductal carcinoma in situ of left breast: Secondary | ICD-10-CM | POA: Diagnosis not present

## 2018-07-26 DIAGNOSIS — Z170 Estrogen receptor positive status [ER+]: Secondary | ICD-10-CM

## 2018-07-27 ENCOUNTER — Telehealth: Payer: Self-pay | Admitting: Plastic Surgery

## 2018-07-27 ENCOUNTER — Telehealth: Payer: Self-pay | Admitting: Hematology and Oncology

## 2018-07-27 ENCOUNTER — Telehealth: Payer: Self-pay

## 2018-07-27 DIAGNOSIS — R350 Frequency of micturition: Secondary | ICD-10-CM | POA: Diagnosis not present

## 2018-07-27 NOTE — Telephone Encounter (Signed)
Called patient to confirm appointment scheduled for tomorrow. Patient answered the following questions: °1.Has the patient traveled outside of the state of [place] at all within the past 6 weeks? No °2.Does the patient have a fever or cough at all? No °3.Has the patient been tested for COVID? Had a positive COVID test? No °4. Has the patient been in contact with anyone who has tested positive? No ° °

## 2018-07-27 NOTE — Telephone Encounter (Signed)
   [facility] Pre-operative Risk Assessment    Request for surgical clearance:  1. What type of surgery is being performed? Breast Surgery   2. When is this surgery scheduled? TBD   3. What type of clearance is required (medical clearance vs. Pharmacy clearance to hold med vs. Both)? Medical  4. Are there any medications that need to be held prior to surgery and how long?N/a   5. Practice name and name of physician performing surgery? [facility]   6. What is your office phone number [phone]    7.   What is your office fax number [phone] ATTN: Carlene Coria  8.   Anesthesia type (None, local, MAC, general) ? General   Meryl Crutch 07/27/2018, 8:10 AM  _________________________________________________________________   (provider comments below)

## 2018-07-27 NOTE — Telephone Encounter (Signed)
   Primary Cardiologist: [name], MD  Chart reviewed as part of pre-operative protocol coverage. Patient was contacted 07/27/2018 in reference to pre-operative risk assessment for pending surgery as outlined below.  CAROLL WEINHEIMER was last seen on 05/23/18 by Malka So, PA.  Since that day, JALACIA MATTILA has done well from a cardiac standpoint. She has a hx of PAF first noted with cystoscopy procedure. She converted to SR with diltiazem. Follow up heart monitor showed ~4% afib burden. She has occ brief afib controlled with prn metoprolol. She has opted to no anticoagulate due to side effects of Eliquis. She has had normal stress test in 04/2018. Normal echo in 07/18/2017 except for mild AR. She walks for exercise daily up to 2 miles.   The patient may develop afib with her surgery, but this should be able to be managed easily with metoprolol or diltiazem as needed.   Therefore, based on ACC/AHA guidelines, the patient would be at acceptable risk for the planned procedure without further cardiovascular testing.   I will route this recommendation to the requesting party via Epic fax function and remove from pre-op pool.  Please call with questions.  Daune Perch, NP 07/27/2018, 1:38 PM

## 2018-07-27 NOTE — Telephone Encounter (Signed)
Received a new breat consult from Dr. Marlou Starks at [facility]. Pt has been cld and scheduled to see Dr. Lindi Adie on 5/18 at 1pm. Pt aware to arrive 15 minutes early.

## 2018-07-28 ENCOUNTER — Encounter: Payer: Self-pay | Admitting: *Deleted

## 2018-07-28 ENCOUNTER — Other Ambulatory Visit: Payer: Self-pay

## 2018-07-28 ENCOUNTER — Encounter: Payer: Self-pay | Admitting: Plastic Surgery

## 2018-07-28 ENCOUNTER — Ambulatory Visit (INDEPENDENT_AMBULATORY_CARE_PROVIDER_SITE_OTHER): Payer: Medicare Other | Admitting: Plastic Surgery

## 2018-07-28 VITALS — BP 149/74 | HR 60 | Temp 98.1°F | Ht 64.5 in | Wt 182.0 lb

## 2018-07-28 DIAGNOSIS — Z170 Estrogen receptor positive status [ER+]: Secondary | ICD-10-CM

## 2018-07-28 DIAGNOSIS — C50312 Malignant neoplasm of lower-inner quadrant of left female breast: Secondary | ICD-10-CM

## 2018-07-28 NOTE — Progress Notes (Addendum)
Patient ID: Lorraine [name], female    DOB: 1950-11-21, 68 y.o.   MRN: [id number]   Chief Complaint  Patient presents with  . Breast Cancer    The patient is a 68 year old female here for consultation for breast reconstruction.  She went for a routine mammogram in April which was irregular.  She then had a Left breast biopsy in May which showed ductal carcinoma in situ with calcifications.  It was estrogen and progesterone positive.  She is 5 feet 4 inches tall and weighs 182 pounds.  She is typically a 42 B/C but has gained weight recently.  She does not have a family history that she knows of with breast cancer. She was sent by Dr. Marlou Starks.  She is quite concerned about the finances and what insurance will or will not cover.  She is completely okay with having smaller breasts.  Her main goal is for symmetry.  She was recently seen in the emergency room for atrial fibrillation.  She is not currently on any anticoagulation.  She has had several surgeries which are listed below.   Review of Systems  Constitutional: Negative.  Negative for activity change and appetite change.  HENT: Negative.   Eyes: Negative.   Respiratory: Negative for chest tightness and shortness of breath.   Cardiovascular: Negative.  Negative for leg swelling.  Gastrointestinal: Negative.  Negative for abdominal distention and abdominal pain.  Genitourinary: Negative.   Musculoskeletal: Positive for back pain and neck pain.  Skin: Negative.  Negative for wound.  Psychiatric/Behavioral: Negative.     Past Medical History:  Diagnosis Date  . Anxiety   . Aortic atherosclerosis ([place]) 02/03/2018   Noted on CT Abd/Pelvis  . AR (aortic regurgitation) 07/18/2017   Mild, noted on ECHO  . Bilateral cataracts   . Bilateral plantar fasciitis   . Chronic knee pain   . Chronic low back pain   . Chronic lumbar radiculopathy 07/14/2017   Mild, L5, noted on electromyography  . History of palpitations    2014-- low risk  exercise tolerence test, no ischemi, PVC's (09-06-2012)  . HSV-1 (herpes simplex virus 1) infection    Cold sore  . Hx of colonic polyps   . Hyperlipidemia   . Insomnia   . Lactose intolerance   . Left nephrolithiasis 02/03/2018   Nonobstructing, noted on CT Abd/pelvis  . Morton's metatarsalgia, neuralgia, or neuroma, bilateral   . Osteoarthritis    knees and lumbar, feet  . Osteopenia   . Peripheral neuropathy    bilateral feet -- burning and stinging  . PONV (postoperative nausea and vomiting)    severe  . PVC's (premature ventricular contractions)   . Systolic murmur    "Slight"  . Vaginal cyst   . Wears glasses     Past Surgical History:  Procedure Laterality Date  . ANTERIOR CERVICAL DECOMP/DISCECTOMY FUSION  01/2014   C4 -- C6  . CATARACT EXTRACTION W/ INTRAOCULAR LENS  IMPLANT, BILATERAL  2015  . COLONOSCOPY  05/14/2015  . CYSTO WITH HYDRODISTENSION N/A 04/14/2018   Procedure: CYSTOSCOPY/HYDRODISTENSION WITH INSTILLATION OF PYRIDIUM AND MARCAINE, BLADDER BIOPSY WITH FULGERATION 0.5 TO 2 CM;  Surgeon: [name], MD;  Location: [facility];  Service: Urology;  Laterality: N/A;  . ELBOW DEBRIDEMENT Right 09/10/2010   and tendon debridement and radial tunnel release  . EXCISION MORTON'S NEUROMA Bilateral left 02-20-2009/  right & left 05-08-2009  . EXCISION VAGINAL CYST N/A 07/01/2016   Procedure: EXCISION  VAGINAL CYST;  Surgeon: Tyson Dense, MD;  Location: [facility];  Service: Gynecology;  Laterality: N/A;  . PLANTAR FASCIA RELEASE Bilateral left 04-02-2010/  right 04-02-2016  . TONSILLECTOMY AND ADENOIDECTOMY  child  . TOTAL ABDOMINAL HYSTERECTOMY  1974   w/  Left salpingoophorectomy and Partial right salpingoophorectomy      Current Outpatient Medications:  .  ALPRAZolam (XANAX) 0.5 MG tablet, Take 1 tablet (0.5 mg total) by mouth 2 (two) times daily as needed for anxiety., Disp: 60 tablet, Rfl: 1 .  busPIRone (BUSPAR)  7.5 MG tablet, Take 1 tablet (7.5 mg total) by mouth 2 (two) times daily., Disp: 60 tablet, Rfl: 3 .  ezetimibe (ZETIA) 10 MG tablet, Take 1 tablet (10 mg total) by mouth daily., Disp: 90 tablet, Rfl: 3 .  meloxicam (MOBIC) 15 MG tablet, TAKE 1 TABLET BY MOUTH EVERY DAY, Disp: 30 tablet, Rfl: 0 .  metoprolol tartrate (LOPRESSOR) 25 MG tablet, Take 1/2-1 tablet by mouth every 6-8hours for breakthrough afib for heart rates over 100, Disp: 45 tablet, Rfl: 3 .  polyvinyl alcohol (LIQUIFILM TEARS) 1.4 % ophthalmic solution, Place 1 drop into both eyes as needed for dry eyes., Disp: , Rfl:  .  valACYclovir (VALTREX) 1000 MG tablet, Take 1,000 mg by mouth daily as needed., Disp: , Rfl:  .  zolpidem (AMBIEN) 10 MG tablet, Take 1 tablet (10 mg total) by mouth at bedtime., Disp: 30 tablet, Rfl: 3   Objective:   Vitals:   07/28/18 0839  BP: (!) 149/74  Pulse: 60  Temp: 98.1 F (36.7 C)  SpO2: 99%    Physical Exam Vitals signs and nursing note reviewed.  Constitutional:      Appearance: Normal appearance.  HENT:     Head: Normocephalic and atraumatic.     Nose: Nose normal.     Mouth/Throat:     Mouth: Mucous membranes are moist.  Eyes:     Extraocular Movements: Extraocular movements intact.  Cardiovascular:     Rate and Rhythm: Normal rate.  Pulmonary:     Effort: Pulmonary effort is normal.  Abdominal:     General: Abdomen is flat. There is no distension.     Tenderness: There is no abdominal tenderness.  Skin:    General: Skin is warm.     Capillary Refill: Capillary refill takes less than 2 seconds.  Neurological:     General: No focal deficit present.     Mental Status: She is alert and oriented to person, place, and time.  Psychiatric:        Mood and Affect: Mood normal.        Behavior: Behavior normal.     Assessment & Plan:  Malignant neoplasm of lower-inner quadrant of left breast in female, estrogen receptor positive ([place])   Call placed to Dr. Marlou Starks to discuss the  above. Pictures were taken and placed in the chart with the patient permission. Assessment and Plan:  A long, detailed conversation was had regarding the patient's options for breast reconstruction. Five main points, which are explained to all breast reconstruction patients, were discussed.  1. Breast reconstruction is an optional process.  2. Breast reconstruction is a multi-stage process which involves multiple surgeries spaced several months apart. The entire process can take over one year.  3. The major goal of breast reconstruction is to have the patient look normal in clothing. When naked, there will always be scars.  4. Asymmetries are often present during the reconstruction  process. Several operations may be needed, including surgery to the non-cancerous breast, to achieve satisfactory results.  5. No matter the reconstructive method, there are ways that the reconstruction can fail and a secondary reconstructive plan would need to be created.   A general discussion regarding all available methods of breast reconstruction were discussed. The types of reconstructions described included.  1. Tissue expander and implant based reconstruction, both single and multi-stage approaches.  2. Autologous only reconstructions, including free abdominal-tissue based reconstructions.  3. Combination procedures, particularly latissismus dorsi flaps combined with either expanders or implants.  For each of the reconstruction methods mentioned above, the risks, benefits, alternatives, scarring, and recovery time were discussed in great detail. Specific risks detailed included bleeding, infection, hematoma, seroma, scarring, pain, wound healing complications, flap loss, fat necrosis, capsular contracture, need for implant removal, donor site complications, bulge, hernia, umbilical necrosis, need for urgent reoperation, and need for dressing changes were discussed.   Assessment  Once all reconstruction options  were presented, a focused discussion was had regarding the patient's suitability for each of these procedures.  A total of 50 minutes of face-to-face time was spent in this encounter, of which >50% was spent in counseling.  The patient's first choice is for a left partial mastectomy if eligible.  If she decides on this route she could have a reduction on the left side and at some point the right side for symmetry if she decides on a full mastectomy on the left she would like to undergo implant-based reconstruction with symmetry surgery on the right at the time of the expander to implant exchange.  She is planning to see the oncologist on Monday and call me afterwards.  08/01/18 I spoke with Dr. Marlou Starks and then the patient over the weekend.  She is wanting to have the left mastectomy and immediate reconstruction with expander and flex HD with right symmetry surgery. [place], DO

## 2018-07-30 NOTE — Progress Notes (Signed)
[facility] NOTE  Patient Care Team: [name], MD as PCP - General (Family Medicine) [name], MD as PCP - Cardiology (Cardiology) [name], MD as Consulting Physician (Obstetrics and Gynecology) [name], [place] as Consulting Physician (Podiatry) [name], MD as Consulting Physician (Rheumatology) [name], MD as Attending Physician (Orthopedic Surgery) [name], MD as Consulting Physician (Cardiology)  CHIEF COMPLAINTS/PURPOSE OF CONSULTATION: Newly diagnosed breast cancer  HISTORY OF PRESENTING ILLNESS:  Lorraine [name] 68 y.o. female is here because of recent diagnosis of DCIS of the left breast. The cancer was detected on a routine screening mammogram. A diagnostic mammogram on 07/11/18 showed a 10cm span of calcifications in the left breast. A breast biopsy on 07/17/18 showed intermediate grade DCIS, ER 100%, PR 100%.   She presents to the clinic today for initial consultation and discussion of treatment options.  I reviewed her records extensively and collaborated the history with the patient.  SUMMARY OF ONCOLOGIC HISTORY:   Malignant neoplasm of lower-inner quadrant of left breast in female, estrogen receptor positive ([place])   07/26/2018 Initial Diagnosis    Screening mammogram detected a 10cm span of calcifications in the left breast, biopsy confirmed intermediate grade DCIS, ER 100%, PR 100%.     07/26/2018 Cancer Staging    Staging form: Breast, AJCC 8th Edition - Clinical: Stage 0 (cTis (DCIS), cN0, cM0, ER+, PR+) - Signed by Gardenia Phlegm, NP on 07/26/2018     MEDICAL HISTORY:  Past Medical History:  Diagnosis Date   Anxiety    Aortic atherosclerosis ([place]) 02/03/2018   Noted on CT Abd/Pelvis   AR (aortic regurgitation) 07/18/2017   Mild, noted on ECHO   Bilateral cataracts    Bilateral plantar fasciitis    Chronic knee pain    Chronic low back pain    Chronic lumbar  radiculopathy 07/14/2017   Mild, L5, noted on electromyography   History of palpitations    2014-- low risk exercise tolerence test, no ischemi, PVC's (09-06-2012)   HSV-1 (herpes simplex virus 1) infection    Cold sore   Hx of colonic polyps    Hyperlipidemia    Insomnia    Lactose intolerance    Left nephrolithiasis 02/03/2018   Nonobstructing, noted on CT Abd/pelvis   Morton's metatarsalgia, neuralgia, or neuroma, bilateral    Osteoarthritis    knees and lumbar, feet   Osteopenia    Peripheral neuropathy    bilateral feet -- burning and stinging   PONV (postoperative nausea and vomiting)    severe   PVC's (premature ventricular contractions)    Systolic murmur    "Slight"   Vaginal cyst    Wears glasses     SURGICAL HISTORY: Past Surgical History:  Procedure Laterality Date   ANTERIOR CERVICAL DECOMP/DISCECTOMY FUSION  01/2014   C4 -- C6   CATARACT EXTRACTION W/ INTRAOCULAR LENS  IMPLANT, BILATERAL  2015   COLONOSCOPY  05/14/2015   CYSTO WITH HYDRODISTENSION N/A 04/14/2018   Procedure: CYSTOSCOPY/HYDRODISTENSION WITH INSTILLATION OF PYRIDIUM AND MARCAINE, BLADDER BIOPSY WITH FULGERATION 0.5 TO 2 CM;  Surgeon: [name], MD;  Location: [facility];  Service: Urology;  Laterality: N/A;   ELBOW DEBRIDEMENT Right 09/10/2010   and tendon debridement and radial tunnel release   EXCISION MORTON'S NEUROMA Bilateral left 02-20-2009/  right & left 05-08-2009   EXCISION VAGINAL CYST N/A 07/01/2016   Procedure: EXCISION VAGINAL CYST;  Surgeon: Tyson Dense,  MD;  Location: [facility];  Service: Gynecology;  Laterality: N/A;   PLANTAR FASCIA RELEASE Bilateral left 04-02-2010/  right 04-02-2016   TONSILLECTOMY AND ADENOIDECTOMY  child   TOTAL ABDOMINAL HYSTERECTOMY  1974   w/  Left salpingoophorectomy and Partial right salpingoophorectomy    SOCIAL HISTORY: Social History   Socioeconomic History   Marital  status: Divorced    Spouse name: Not on file   Number of children: 1   Years of education: 14   Highest education level: Not on file  Occupational History   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    Employer: [employer] resource strain: Not on file   Food insecurity:    Worry: Not on file    Inability: Not on file   Transportation needs:    Medical: Not on file    Non-medical: Not on file  Tobacco Use   Smoking status: Never Smoker   Smokeless tobacco: Never Used  Substance and Sexual Activity   Alcohol use: Yes    Comment: rare   Drug use: No   Sexual activity: Not on file  Lifestyle   Physical activity:    Days per week: Not on file    Minutes per session: Not on file   Stress: Not on file  Relationships   Social connections:    Talks on phone: Not on file    Gets together: Not on file    Attends religious service: Not on file    Active member of club or organization: Not on file    Attends meetings of clubs or organizations: Not on file    Relationship status: Not on file   Intimate partner violence:    Fear of current or ex partner: Not on file    Emotionally abused: Not on file    Physically abused: Not on file    Forced sexual activity: Not on file  Other Topics Concern   Not on file  Social History Narrative   Lives alone in a one story home.  Has one son.  Works as a Counsellor for [employer].  Education: some college.     FAMILY HISTORY: Family History  Problem Relation Age of Onset   Lung cancer Mother        lung   Heart attack Father    Memory loss Father    Cancer Paternal Grandfather        colon   Multiple sclerosis Brother     ALLERGIES:  is allergic to amitriptyline; codeine; and nortriptyline.  MEDICATIONS:  Current Outpatient Medications  Medication Sig Dispense Refill   ALPRAZolam (XANAX) 0.5 MG tablet Take 1 tablet (0.5 mg total) by mouth 2 (two) times daily as  needed for anxiety. 60 tablet 1   busPIRone (BUSPAR) 7.5 MG tablet Take 1 tablet (7.5 mg total) by mouth 2 (two) times daily. 60 tablet 3   ezetimibe (ZETIA) 10 MG tablet Take 1 tablet (10 mg total) by mouth daily. 90 tablet 3   meloxicam (MOBIC) 15 MG tablet TAKE 1 TABLET BY MOUTH EVERY DAY 30 tablet 0   metoprolol tartrate (LOPRESSOR) 25 MG tablet Take 1/2-1 tablet by mouth every 6-8hours for breakthrough afib for heart rates over 100 45 tablet 3   UNABLE TO FIND Magnesium Plus (PURE) 1 scoop (5.7G) 2-3 times weekly     valACYclovir (VALTREX) 1000 MG tablet Take 1,000 mg by mouth daily as needed.  VITAMIN D PO Take by mouth. Vit D3 5,000 IU     zolpidem (AMBIEN) 10 MG tablet Take 1 tablet (10 mg total) by mouth at bedtime. 30 tablet 3   polyvinyl alcohol (LIQUIFILM TEARS) 1.4 % ophthalmic solution Place 1 drop into both eyes as needed for dry eyes.     No current facility-administered medications for this visit.     REVIEW OF SYSTEMS:   Constitutional: Denies fevers, chills or abnormal night sweats Eyes: Denies blurriness of vision, double vision or watery eyes Ears, nose, mouth, throat, and face: Denies mucositis or sore throat Respiratory: Denies cough, dyspnea or wheezes Cardiovascular: Denies palpitation, chest discomfort or lower extremity swelling Gastrointestinal:  Denies nausea, heartburn or change in bowel habits Skin: Denies abnormal skin rashes Lymphatics: Denies new lymphadenopathy or easy bruising Neurological:Denies numbness, tingling or new weaknesses Behavioral/Psych: Mood is stable, no new changes  Breast: Denies any palpable lumps or discharge All other systems were reviewed with the patient and are negative.  PHYSICAL EXAMINATION: ECOG PERFORMANCE STATUS: 1 - Symptomatic but completely ambulatory  Vitals:   07/31/18 1243  BP: (!) 138/58  Pulse: (!) 51  Resp: 17  Temp: 98.5 F (36.9 C)  SpO2: 100%   Filed Weights   07/31/18 1243  Weight: 177  lb 12.8 oz (80.6 kg)    GENERAL:alert, no distress and comfortable SKIN: skin color, texture, turgor are normal, no rashes or significant lesions EYES: normal, conjunctiva are pink and non-injected, sclera clear OROPHARYNX:no exudate, no erythema and lips, buccal mucosa, and tongue normal  NECK: supple, thyroid normal size, non-tender, without nodularity LYMPH:  no palpable lymphadenopathy in the cervical, axillary or inguinal LUNGS: clear to auscultation and percussion with normal breathing effort HEART: regular rate & rhythm and no murmurs and no lower extremity edema ABDOMEN:abdomen soft, non-tender and normal bowel sounds Musculoskeletal:no cyanosis of digits and no clubbing  PSYCH: alert & oriented x 3 with fluent speech NEURO: no focal motor/sensory deficits BREAST: No palpable nodules in breast. No palpable axillary or supraclavicular lymphadenopathy (exam performed in the presence of a chaperone)   LABORATORY DATA:  I have reviewed the data as listed Lab Results  Component Value Date   WBC 6.4 05/18/2018   HGB 16.2 (H) 05/18/2018   HCT 48.7 (H) 05/18/2018   MCV 86.2 05/18/2018   PLT 316.0 05/18/2018   Lab Results  Component Value Date   NA 140 05/18/2018   K 4.3 05/18/2018   CL 104 05/18/2018   CO2 28 05/18/2018    RADIOGRAPHIC STUDIES: I have personally reviewed the radiological reports and agreed with the findings in the report.  ASSESSMENT AND PLAN:  Malignant neoplasm of lower-inner quadrant of left breast in female, estrogen receptor positive ([place]) 07/17/2018: Screening mammogram detected a 10cm span of calcifications in the left breast, biopsy confirmed intermediate grade DCIS, ER 100%, PR 100%.   Pathology review: I discussed with the patient the difference between DCIS and invasive breast cancer. It is considered a precancerous lesion. DCIS is classified as a 0. It is generally detected through mammograms as calcifications. We discussed the significance of  grades and its impact on prognosis. We also discussed the importance of ER and PR receptors and their implications to adjuvant treatment options. Prognosis of DCIS dependence on grade, comedo necrosis. It is anticipated that if not treated, 20-30% of DCIS can develop into invasive breast cancer.  Recommendation: 1.  Mastectomy left breast 2. Followed by antiestrogen therapy with tamoxifen 5 years  Tamoxifen  counseling: We discussed the risks and benefits of tamoxifen. These include but not limited to insomnia, hot flashes, mood changes, vaginal dryness, and weight gain. Although rare, serious side effects including endometrial cancer, risk of blood clots were also discussed. We strongly believe that the benefits far outweigh the risks. Patient understands these risks and consented to starting treatment. Planned treatment duration is 5 years.  AFT 25 COMET Phase 3 clinical trial for low risk DCIS grade 1/2 PR positive, age greater than 40 randomized to surgery +/- radiation, +/- endocrine therapy versus active surveillance with +/- endocrine therapy surveillance with mammograms every 6 months for 5 years;patient's have option to decline elevated arm and still be followed on study   We provided her with all the information and the website to learn more about the study.  She understands the process of randomization and all of her questions have been answered.  Return to clinic based on a decision regarding the clinical trial.  All questions were answered. The patient knows to call the clinic with any problems, questions or concerns.   Rulon Eisenmenger, MD   I, Molly Dorshimer, am acting as scribe for Nicholas Lose, MD.  I have reviewed the above documentation for accuracy and completeness, and I agree with the above.

## 2018-07-31 ENCOUNTER — Other Ambulatory Visit: Payer: Self-pay

## 2018-07-31 ENCOUNTER — Telehealth: Payer: Self-pay | Admitting: *Deleted

## 2018-07-31 ENCOUNTER — Inpatient Hospital Stay: Payer: Medicare Other | Attending: Hematology and Oncology | Admitting: Hematology and Oncology

## 2018-07-31 DIAGNOSIS — Z9012 Acquired absence of left breast and nipple: Secondary | ICD-10-CM | POA: Diagnosis not present

## 2018-07-31 DIAGNOSIS — R011 Cardiac murmur, unspecified: Secondary | ICD-10-CM | POA: Diagnosis not present

## 2018-07-31 DIAGNOSIS — M25569 Pain in unspecified knee: Secondary | ICD-10-CM | POA: Insufficient documentation

## 2018-07-31 DIAGNOSIS — M8580 Other specified disorders of bone density and structure, unspecified site: Secondary | ICD-10-CM | POA: Insufficient documentation

## 2018-07-31 DIAGNOSIS — R102 Pelvic and perineal pain: Secondary | ICD-10-CM | POA: Diagnosis not present

## 2018-07-31 DIAGNOSIS — Z87442 Personal history of urinary calculi: Secondary | ICD-10-CM | POA: Insufficient documentation

## 2018-07-31 DIAGNOSIS — G8929 Other chronic pain: Secondary | ICD-10-CM | POA: Insufficient documentation

## 2018-07-31 DIAGNOSIS — E785 Hyperlipidemia, unspecified: Secondary | ICD-10-CM | POA: Diagnosis not present

## 2018-07-31 DIAGNOSIS — M62838 Other muscle spasm: Secondary | ICD-10-CM | POA: Diagnosis not present

## 2018-07-31 DIAGNOSIS — Z86010 Personal history of colonic polyps: Secondary | ICD-10-CM | POA: Insufficient documentation

## 2018-07-31 DIAGNOSIS — G629 Polyneuropathy, unspecified: Secondary | ICD-10-CM | POA: Insufficient documentation

## 2018-07-31 DIAGNOSIS — M545 Low back pain: Secondary | ICD-10-CM | POA: Diagnosis not present

## 2018-07-31 DIAGNOSIS — Z79899 Other long term (current) drug therapy: Secondary | ICD-10-CM | POA: Diagnosis not present

## 2018-07-31 DIAGNOSIS — Z79810 Long term (current) use of selective estrogen receptor modulators (SERMs): Secondary | ICD-10-CM | POA: Diagnosis not present

## 2018-07-31 DIAGNOSIS — I700 Atherosclerosis of aorta: Secondary | ICD-10-CM | POA: Insufficient documentation

## 2018-07-31 DIAGNOSIS — F419 Anxiety disorder, unspecified: Secondary | ICD-10-CM | POA: Insufficient documentation

## 2018-07-31 DIAGNOSIS — C50312 Malignant neoplasm of lower-inner quadrant of left female breast: Secondary | ICD-10-CM | POA: Insufficient documentation

## 2018-07-31 DIAGNOSIS — R3982 Chronic bladder pain: Secondary | ICD-10-CM | POA: Diagnosis not present

## 2018-07-31 DIAGNOSIS — M6281 Muscle weakness (generalized): Secondary | ICD-10-CM | POA: Diagnosis not present

## 2018-07-31 DIAGNOSIS — Z170 Estrogen receptor positive status [ER+]: Secondary | ICD-10-CM | POA: Insufficient documentation

## 2018-07-31 DIAGNOSIS — R300 Dysuria: Secondary | ICD-10-CM | POA: Diagnosis not present

## 2018-07-31 DIAGNOSIS — M1990 Unspecified osteoarthritis, unspecified site: Secondary | ICD-10-CM | POA: Insufficient documentation

## 2018-07-31 NOTE — Telephone Encounter (Signed)
Called pt to provide navigation resources and contact information. Pt relate she is considering the COMET trial and will call with her decision. Denies further questions or needs at this time

## 2018-07-31 NOTE — Assessment & Plan Note (Signed)
07/17/2018: Screening mammogram detected a 10cm span of calcifications in the left breast, biopsy confirmed intermediate grade DCIS, ER 100%, PR 100%.   Pathology review: I discussed with the patient the difference between DCIS and invasive breast cancer. It is considered a precancerous lesion. DCIS is classified as a 0. It is generally detected through mammograms as calcifications. We discussed the significance of grades and its impact on prognosis. We also discussed the importance of ER and PR receptors and their implications to adjuvant treatment options. Prognosis of DCIS dependence on grade, comedo necrosis. It is anticipated that if not treated, 20-30% of DCIS can develop into invasive breast cancer.  Recommendation: 1.  Mastectomy left breast 2. Followed by antiestrogen therapy with tamoxifen 5 years  Tamoxifen counseling: We discussed the risks and benefits of tamoxifen. These include but not limited to insomnia, hot flashes, mood changes, vaginal dryness, and weight gain. Although rare, serious side effects including endometrial cancer, risk of blood clots were also discussed. We strongly believe that the benefits far outweigh the risks. Patient understands these risks and consented to starting treatment. Planned treatment duration is 5 years.  Return to clinic after surgery to discuss the final pathology report and come up with an adjuvant treatment plan.

## 2018-08-01 ENCOUNTER — Telehealth: Payer: Self-pay

## 2018-08-01 NOTE — Telephone Encounter (Signed)
AFT-25 COMET Patient was given this RN's contact information during her visit with Dr. Lindi Adie on 07/31/18. Pt is potentially eligible for COMET trial and had a few questions. This RN explained what would be required at each visit for the trial. Informed patient of the details of each arm and the process of randomization. Patient wanted to know if Medicare covered mammograms for the surveillance arm. This RN was unsure, but told patient she would find out and contact her by the end of this week. Pt wanted to know the purpose of hormone therapy for her cancer. Briefly explained her ER/PR + status and the effects of hormone therapy on this type of cancer. Patient asked about side effects. Briefly explained some of the potential side effects. Encouraged patient to contact Dr. Lindi Adie if she had more questions about hormone therapy. Patient stated that she was waiting for confirmation from Medicare that it would cover surgery on her right breast if she got a mastectomy on her left and that once she received this information and if Medicare covered the mammograms she would be able to reach a decision. Reassured patient that she should take time to think it over and decide. Patient said that she would call this RN back after she received the aforementioned information. Thanked patient for her time and interest. Johney Maine RN, BSN Clinical Research  08/01/18 12:36 PM

## 2018-08-02 ENCOUNTER — Other Ambulatory Visit: Payer: Self-pay | Admitting: Family Medicine

## 2018-08-03 ENCOUNTER — Telehealth: Payer: Self-pay | Admitting: *Deleted

## 2018-08-03 NOTE — Telephone Encounter (Signed)
Patient called on (08/01/18) to follow-up with [place] to see if we had heard back from insurance [facility]) regarding if they will cover for her to have a (R) breast reduction.  She also wanted to let Dr. Marla Roe know that she saw her oncologist on Monday and he said with her diagnosis of DCIS he considers her to be precancerous.  Informed the patient that I informed Dr. Marla Roe of what her oncologist had told her.  I also said told her that Dr. Marla Roe said that we have not heard back from the insurance company yet.  She said it will possible be about 1-2 weeks before we heard something, and we will let her know as soon as we hear from the insurance company.  Patient verbalized understanding and agreed.//AB/CMA

## 2018-08-04 ENCOUNTER — Encounter (HOSPITAL_COMMUNITY): Payer: Self-pay

## 2018-08-04 ENCOUNTER — Ambulatory Visit (HOSPITAL_COMMUNITY)
Admission: RE | Admit: 2018-08-04 | Discharge: 2018-08-04 | Disposition: A | Discharge status: Home / Self Care | Payer: Medicare Other | Source: Ambulatory Visit | Attending: Physician Assistant | Admitting: Physician Assistant

## 2018-08-04 ENCOUNTER — Other Ambulatory Visit: Payer: Self-pay

## 2018-08-04 DIAGNOSIS — I480 Paroxysmal atrial fibrillation: Secondary | ICD-10-CM

## 2018-08-04 MED ORDER — RIVAROXABAN 20 MG PO TABS: 20.0000 mg | ORAL_TABLET | Freq: Every day | ORAL | 6 refills | Status: DC

## 2018-08-04 NOTE — Progress Notes (Signed)
Electrophysiology TeleHealth Note   Due to national recommendations of social distancing due to [place] 19, Audio telehealth visit is felt to be most appropriate for this patient at this time.  See consent below from today for patient consent regarding telehealth for the Atrial Fibrillation Clinic. Consent obtained verbally.   Date:  08/04/2018   ID:  Lorraine [name], DOB [birth year], MRN [id number]  Location: home  Provider location: [address] [place], [place] [address] Evaluation Performed: Follow up  PCP:  Lorraine Minium, MD  Primary Cardiologist:  Dr Lorraine [name]   CC: Follow up for atrial fibrillation   History of Present Illness: Lorraine [name] is a 68 y.o. female who presents via audio conferencing for a telehealth visit today. Patient reports that she has done reasonably well since her last visit. She reports that she has palpitations about once per week which last about 30 minutes and resolve with PRN BB dose. Unfortunately, she does have a recent diagnosis of breast cancer (DCIS) and workup with oncology is ongoing. Of note, she stopped taking Eliquis 2/2 side effects and has not been on anticoagulation.   Today, she denies symptoms chest pain, shortness of breath, orthopnea, PND, lower extremity edema, claudication, dizziness, presyncope, syncope, bleeding, or neurologic sequela. The patient is tolerating medications without difficulties and is otherwise without complaint today.   she denies symptoms of cough, fevers, chills, or new SOB worrisome for COVID 19.     Atrial Fibrillation Risk Factors:  she does not have symptoms or diagnosis of sleep apnea. she does not have a history of rheumatic fever. she does not have a history of alcohol use.   she has a BMI of There is no height or weight on file to calculate BMI..  BP 114/84 Pulse 48 Provided by pt with home BP machine.  Past Medical History:  Diagnosis Date   Anxiety    Aortic atherosclerosis ([place])  02/03/2018   Noted on CT Abd/Pelvis   AR (aortic regurgitation) 07/18/2017   Mild, noted on ECHO   Bilateral cataracts    Bilateral plantar fasciitis    Chronic knee pain    Chronic low back pain    Chronic lumbar radiculopathy 07/14/2017   Mild, L5, noted on electromyography   History of palpitations    2014-- low risk exercise tolerence test, no ischemi, PVC's (09-06-2012)   HSV-1 (herpes simplex virus 1) infection    Cold sore   Hx of colonic polyps    Hyperlipidemia    Insomnia    Lactose intolerance    Left nephrolithiasis 02/03/2018   Nonobstructing, noted on CT Abd/pelvis   Morton's metatarsalgia, neuralgia, or neuroma, bilateral    Osteoarthritis    knees and lumbar, feet   Osteopenia    Peripheral neuropathy    bilateral feet -- burning and stinging   PONV (postoperative nausea and vomiting)    severe   PVC's (premature ventricular contractions)    Systolic murmur    "Slight"   Vaginal cyst    Wears glasses    Past Surgical History:  Procedure Laterality Date   ANTERIOR CERVICAL DECOMP/DISCECTOMY FUSION  01/2014   C4 -- C6   CATARACT EXTRACTION W/ INTRAOCULAR LENS  IMPLANT, BILATERAL  2015   COLONOSCOPY  05/14/2015   CYSTO WITH HYDRODISTENSION N/A 04/14/2018   Procedure: CYSTOSCOPY/HYDRODISTENSION WITH INSTILLATION OF PYRIDIUM AND MARCAINE, BLADDER BIOPSY WITH FULGERATION 0.5 TO 2 CM;  Surgeon: [name], MD;  Location[facility];  Service: Urology;  Laterality: N/A;   ELBOW DEBRIDEMENT Right 09/10/2010   and tendon debridement and radial tunnel release   EXCISION MORTON'S NEUROMA Bilateral left 02-20-2009/  right & left 05-08-2009   EXCISION VAGINAL CYST N/A 07/01/2016   Procedure: EXCISION VAGINAL CYST;  Surgeon: Tyson Dense, MD;  Location: [facility];  Service: Gynecology;  Laterality: N/A;   PLANTAR FASCIA RELEASE Bilateral left 04-02-2010/  right 04-02-2016   TONSILLECTOMY AND  ADENOIDECTOMY  child   TOTAL ABDOMINAL HYSTERECTOMY  1974   w/  Left salpingoophorectomy and Partial right salpingoophorectomy     Current Outpatient Medications  Medication Sig Dispense Refill   ALPRAZolam (XANAX) 0.5 MG tablet Take 1 tablet (0.5 mg total) by mouth 2 (two) times daily as needed for anxiety. 60 tablet 1   busPIRone (BUSPAR) 7.5 MG tablet Take 1 tablet (7.5 mg total) by mouth 2 (two) times daily. 60 tablet 3   ezetimibe (ZETIA) 10 MG tablet Take 1 tablet (10 mg total) by mouth daily. 90 tablet 3   meloxicam (MOBIC) 15 MG tablet TAKE 1 TABLET BY MOUTH EVERY DAY 30 tablet 0   metoprolol tartrate (LOPRESSOR) 25 MG tablet Take 1/2-1 tablet by mouth every 6-8hours for breakthrough afib for heart rates over 100 45 tablet 3   polyvinyl alcohol (LIQUIFILM TEARS) 1.4 % ophthalmic solution Place 1 drop into both eyes as needed for dry eyes.     UNABLE TO FIND Magnesium Plus (PURE) 1 scoop (5.7G) 2-3 times weekly     valACYclovir (VALTREX) 1000 MG tablet TAKE 1 TABLET BY MOUTH EVERY DAY 30 tablet 5   VITAMIN D PO Take by mouth. Vit D3 5,000 IU     zolpidem (AMBIEN) 10 MG tablet Take 1 tablet (10 mg total) by mouth at bedtime. 30 tablet 3   No current facility-administered medications for this encounter.     Allergies:   Amitriptyline; Codeine; and Nortriptyline   Social History:  The patient  reports that she has never smoked. She has never used smokeless tobacco. She reports current alcohol use. She reports that she does not use drugs.   Family History:  The patient's  family history includes Cancer in her paternal grandfather; Heart attack in her father; Lung cancer in her mother; Memory loss in her father; Multiple sclerosis in her brother.    ROS:  Please see the history of present illness.   All other systems are personally reviewed and negative.   Recent Labs: 05/18/2018: ALT 19; BUN 23; Creatinine, Ser 0.76; Hemoglobin 16.2; Platelets 316.0; Potassium 4.3; Sodium  140; TSH 1.70  personally reviewed    Other studies personally reviewed: Additional studies/ records that were reviewed today include: Epic notes, heart monitor  Echo 07/2017 - Left ventricle: The cavity size was normal. Wall thickness was   normal. Systolic function was normal. The estimated ejection   fraction was in the range of 55% to 60%. Doppler parameters are   consistent with both elevated ventricular end-diastolic filling   pressure and elevated left atrial filling pressure. - Aortic valve: There was mild regurgitation. - Atrial septum: No defect or patent foramen ovale was identified  Long term monitor 06/05/18 4% AF burden, longest episode 2.5 hrs    ASSESSMENT AND PLAN:  1.  Paroxysmal atrial fibrillation Patient's symptoms well controlled on PRN BB. Continue Lopressor 12.5 mg PRN q 6hrs for heart racing. Long discussion about her stroke risk and the risks and benefits of anticoagulation. She is hesitant to  start any medicine but is agreeable to trying Xarelto if it is affordable for her. Start Xarelto 20 mg daily.  This patients CHA2DS2-VASc Score and unadjusted Ischemic Stroke Rate (% per year) is equal to 3.2 % stroke rate/year from a score of 3  Above score calculated as 1 point each if present [CHF, HTN, DM, Vascular=MI/PAD/Aortic Plaque, Age if 65-74, or Female] Above score calculated as 2 points each if present [Age > 75, or Stroke/TIA/TE]  2. Obesity Lifestyle modification was discussed and encouraged including regular physical activity and weight reduction.  3. DCIS Plans per oncology.   COVID screen The patient does not have any symptoms that suggest any further testing/ screening at this time.  Social distancing reinforced today.    Follow-up with AF clinic in 4 weeks.  Current medicines are reviewed at length with the patient today.   The patient does not have concerns regarding her medicines.  The following changes were made today:  Start  Xarelto  Labs/ tests ordered today include:  No orders of the defined types were placed in this encounter.   Patient Risk:  after full review of this patients clinical status, I feel that they are at moderate risk at this time.   Today, I have spent 25 minutes with the patient with telehealth technology discussing atrial fibrillation, stroke, anticoagulation, and COVID-19 precautions.    Gwenlyn Perking PA-C 08/04/2018 10:15 AM  [facility] [place], [place] [address] [phone]   I hereby voluntarily request, consent and authorize the [facility] and its employed or contracted physicians, physician assistants, nurse practitioners or other licensed health care professionals (the Practitioner), to provide me with telemedicine health care services (the "Services") as deemed necessary by the treating Practitioner. I acknowledge and consent to receive the Services by the Practitioner via telemedicine. I understand that the telemedicine visit will involve communicating with the Practitioner through live audiovisual communication technology and the disclosure of certain medical information by electronic transmission. I acknowledge that I have been given the opportunity to request an in-person assessment or other available alternative prior to the telemedicine visit and am voluntarily participating in the telemedicine visit.   I understand that I have the right to withhold or withdraw my consent to the use of telemedicine in the course of my care at any time, without affecting my right to future care or treatment, and that the Practitioner or I may terminate the telemedicine visit at any time. I understand that I have the right to inspect all information obtained and/or recorded in the course of the telemedicine visit and may receive copies of available information for a reasonable fee.  I understand that some of the potential risks of  receiving the Services via telemedicine include:   Delay or interruption in medical evaluation due to technological equipment failure or disruption;  Information transmitted may not be sufficient (e.g. poor resolution of images) to allow for appropriate medical decision making by the Practitioner; and/or  In rare instances, security protocols could fail, causing a breach of personal health information.   Furthermore, I acknowledge that it is my responsibility to provide information about my medical history, conditions and care that is complete and accurate to the best of my ability. I acknowledge that Practitioner's advice, recommendations, and/or decision may be based on factors not within their control, such as incomplete or inaccurate data provided by me or distortions of diagnostic images or specimens that may result from electronic transmissions.  I understand that the practice of medicine is not an exact science and that Practitioner makes no warranties or guarantees regarding treatment outcomes. I acknowledge that I will receive a copy of this consent concurrently upon execution via email to the email address I last provided but may also request a printed copy by calling the office of the [facility].  I understand that my insurance will be billed for this visit.   I have read or had this consent read to me.  I understand the contents of this consent, which adequately explains the benefits and risks of the Services being provided via telemedicine.  I have been provided ample opportunity to ask questions regarding this consent and the Services and have had my questions answered to my satisfaction.  I give my informed consent for the services to be provided through the use of telemedicine in my medical care  By participating in this telemedicine visit I agree to the above.

## 2018-08-09 ENCOUNTER — Other Ambulatory Visit: Payer: Self-pay | Admitting: Family Medicine

## 2018-08-09 NOTE — Telephone Encounter (Signed)
This was filled 3 weeks ago, ok to continue filling?

## 2018-08-10 ENCOUNTER — Telehealth: Payer: Self-pay

## 2018-08-10 NOTE — Telephone Encounter (Signed)
AFT-25 [facility]  Patient called to further discuss the COMET trial. Pt says she is very interested in enrolling. This RN communicated to patient that from what she had been told Medicare will cover mammograms every six months. This RN explained to patient that if she wanted to pursue with the COMET trial she should be willing to be randomized to either arm. The patient stated that, while she is leaning toward active surveillance, she is still open to surgery as well. This RN asked the patient if an appointment with Dr. Lindi Adie would be helpful to further discuss the pros and cons of active surveillance and surgery. The patient stated that she would like that and that if she was still interested in the COMET trial after her appt then she would sign consent then. This RN said she would send a message to the schedulers to make another appt with Dr. Lindi Adie. Thanked patient for her time and interest. Johney Maine RN, BSN Clinical Research  08/10/18 11:38 AM

## 2018-08-11 ENCOUNTER — Telehealth: Payer: Self-pay | Admitting: Hematology and Oncology

## 2018-08-11 NOTE — Telephone Encounter (Signed)
Scheduled appt per 5/28 sch message - unable to reach patient . Left message with appt date and time

## 2018-08-13 NOTE — Progress Notes (Signed)
Patient Care Team: [name], MD as PCP - General (Family Medicine) [name], MD as PCP - Cardiology (Cardiology) [name], MD as Consulting Physician (Obstetrics and Gynecology) [name], [place] as Consulting Physician (Podiatry) [name], MD as Consulting Physician (Rheumatology) [name], MD as Attending Physician (Orthopedic Surgery) [name], MD as Consulting Physician (Cardiology)  DIAGNOSIS:    ICD-10-CM   1. Malignant neoplasm of lower-inner quadrant of left breast in female, estrogen receptor positive ([place]) C50.312    Z17.0     SUMMARY OF ONCOLOGIC HISTORY:   Malignant neoplasm of lower-inner quadrant of left breast in female, estrogen receptor positive ([place])   07/26/2018 Initial Diagnosis    Screening mammogram detected a 10cm span of calcifications in the left breast, biopsy confirmed intermediate grade DCIS, ER 100%, PR 100%.     07/26/2018 Cancer Staging    Staging form: Breast, AJCC 8th Edition - Clinical: Stage 0 (cTis (DCIS), cN0, cM0, ER+, PR+) - Signed by Gardenia Phlegm, NP on 07/26/2018     CHIEF COMPLIANT: Follow-up to discuss COMET clinical trial  INTERVAL HISTORY: Lorraine [name] is a 68 y.o. with above-mentioned history of left breast DCIS. She is interested in participating in the COMET clinical trial and presents to the clinic today to discuss the pros and cons of active surveillance vs. surgery.   REVIEW OF SYSTEMS:   Constitutional: Denies fevers, chills or abnormal weight loss Eyes: Denies blurriness of vision Ears, nose, mouth, throat, and face: Denies mucositis or sore throat Respiratory: Denies cough, dyspnea or wheezes Cardiovascular: Denies palpitation, chest discomfort Gastrointestinal: Denies nausea, heartburn or change in bowel habits Skin: Denies abnormal skin rashes Lymphatics: Denies new lymphadenopathy or easy bruising Neurological: Denies numbness, tingling or new  weaknesses Behavioral/Psych: Mood is stable, no new changes  Extremities: No lower extremity edema Breast: denies any pain or lumps or nodules in either breasts All other systems were reviewed with the patient and are negative.  I have reviewed the past medical history, past surgical history, social history and family history with the patient and they are unchanged from previous note.  ALLERGIES:  is allergic to amitriptyline; codeine; and nortriptyline.  MEDICATIONS:  Current Outpatient Medications  Medication Sig Dispense Refill  . ALPRAZolam (XANAX) 0.5 MG tablet Take 1 tablet (0.5 mg total) by mouth 2 (two) times daily as needed for anxiety. 60 tablet 1  . busPIRone (BUSPAR) 7.5 MG tablet Take 1 tablet (7.5 mg total) by mouth 2 (two) times daily. 60 tablet 3  . ezetimibe (ZETIA) 10 MG tablet Take 1 tablet (10 mg total) by mouth daily. 90 tablet 3  . meloxicam (MOBIC) 15 MG tablet TAKE 1 TABLET BY MOUTH EVERY DAY 30 tablet 0  . metoprolol tartrate (LOPRESSOR) 25 MG tablet Take 1/2-1 tablet by mouth every 6-8hours for breakthrough afib for heart rates over 100 45 tablet 3  . polyvinyl alcohol (LIQUIFILM TEARS) 1.4 % ophthalmic solution Place 1 drop into both eyes as needed for dry eyes.    . rivaroxaban (XARELTO) 20 MG TABS tablet Take 1 tablet (20 mg total) by mouth daily with supper. 30 tablet 6  . UNABLE TO FIND Magnesium Plus (PURE) 1 scoop (5.7G) 2-3 times weekly    . valACYclovir (VALTREX) 1000 MG tablet TAKE 1 TABLET BY MOUTH EVERY DAY 30 tablet 5  . VITAMIN D PO Take by mouth. Vit D3 5,000 IU    . zolpidem (AMBIEN) 10 MG tablet  Take 1 tablet (10 mg total) by mouth at bedtime. 30 tablet 3   No current facility-administered medications for this visit.     PHYSICAL EXAMINATION: ECOG PERFORMANCE STATUS: 1 - Symptomatic but completely ambulatory  There were no vitals filed for this visit. There were no vitals filed for this visit.  GENERAL: alert, no distress and comfortable  SKIN: skin color, texture, turgor are normal, no rashes or significant lesions EYES: normal, Conjunctiva are pink and non-injected, sclera clear OROPHARYNX: no exudate, no erythema and lips, buccal mucosa, and tongue normal  NECK: supple, thyroid normal size, non-tender, without nodularity LYMPH: no palpable lymphadenopathy in the cervical, axillary or inguinal LUNGS: clear to auscultation and percussion with normal breathing effort HEART: regular rate & rhythm and no murmurs and no lower extremity edema ABDOMEN: abdomen soft, non-tender and normal bowel sounds MUSCULOSKELETAL: no cyanosis of digits and no clubbing  NEURO: alert & oriented x 3 with fluent speech, no focal motor/sensory deficits EXTREMITIES: No lower extremity edema  LABORATORY DATA:  I have reviewed the data as listed CMP Latest Ref Rng & Units 05/18/2018 04/14/2018 07/18/2017  Glucose 70 - 99 mg/dL 95 119(H) 92  BUN 6 - 23 mg/dL 23 19 23   Creatinine 0.40 - 1.20 mg/dL 0.76 0.63 0.70  Sodium 135 - 145 mEq/L 140 138 141  Potassium 3.5 - 5.1 mEq/L 4.3 4.2 4.1  Chloride 96 - 112 mEq/L 104 108 107  CO2 19 - 32 mEq/L 28 22 25   Calcium 8.4 - 10.5 mg/dL 10.1 9.1 9.8  Total Protein 6.0 - 8.3 g/dL 7.6 7.2 7.2  Total Bilirubin 0.2 - 1.2 mg/dL 0.5 0.6 0.6  Alkaline Phos 39 - 117 U/L 133(H) 94 87  AST 0 - 37 U/L 16 21 20   ALT 0 - 35 U/L 19 20 23     Lab Results  Component Value Date   WBC 6.4 05/18/2018   HGB 16.2 (H) 05/18/2018   HCT 48.7 (H) 05/18/2018   MCV 86.2 05/18/2018   PLT 316.0 05/18/2018   NEUTROABS 3.6 05/18/2018    ASSESSMENT & PLAN:  Malignant neoplasm of lower-inner quadrant of left breast in female, estrogen receptor positive ([place]) 07/17/2018: Screening mammogram detected a 10cm span of calcifications in the left breast, biopsy confirmed intermediate grade DCIS, ER 100%, PR 100%.   Recommendation: 1.  Mastectomy left breast versus participation in [facility] clinical trial 2. Followed by antiestrogen therapy with  tamoxifen 5 years  Patient expressed multiple questions regarding the study.  She has read the study consent form as well as went through the DCIS options website and learned about the ins and outs of the study.  She has excellent questions. She is willing to sign consent and participate in the study. I gave her a written prescription for tamoxifen and she will start at 10 mg a day and then if she tolerates it well will increase it to 20 mg. We will hold onto the tamoxifen until we hear the study randomization.  Return to clinic based upon study requirement.   No orders of the defined types were placed in this encounter.  The patient has a good understanding of the overall plan. she agrees with it. she will call with any problems that may develop before the next visit here.  Nicholas Lose, MD 08/14/2018  Julious Oka Dorshimer am acting as scribe for Dr. Nicholas Lose.  I have reviewed the above documentation for accuracy and completeness, and I agree with the above.

## 2018-08-14 ENCOUNTER — Encounter: Payer: Self-pay | Admitting: Medical Oncology

## 2018-08-14 ENCOUNTER — Other Ambulatory Visit: Payer: Self-pay | Admitting: Hematology and Oncology

## 2018-08-14 ENCOUNTER — Telehealth: Payer: Self-pay

## 2018-08-14 ENCOUNTER — Other Ambulatory Visit: Payer: Self-pay

## 2018-08-14 ENCOUNTER — Inpatient Hospital Stay: Payer: Medicare Other | Attending: Hematology and Oncology | Admitting: Hematology and Oncology

## 2018-08-14 DIAGNOSIS — Z170 Estrogen receptor positive status [ER+]: Secondary | ICD-10-CM | POA: Diagnosis not present

## 2018-08-14 DIAGNOSIS — Z7901 Long term (current) use of anticoagulants: Secondary | ICD-10-CM | POA: Insufficient documentation

## 2018-08-14 DIAGNOSIS — Z79899 Other long term (current) drug therapy: Secondary | ICD-10-CM | POA: Insufficient documentation

## 2018-08-14 DIAGNOSIS — Z79810 Long term (current) use of selective estrogen receptor modulators (SERMs): Secondary | ICD-10-CM | POA: Diagnosis not present

## 2018-08-14 DIAGNOSIS — C50312 Malignant neoplasm of lower-inner quadrant of left female breast: Secondary | ICD-10-CM

## 2018-08-14 MED ORDER — TAMOXIFEN CITRATE 20 MG PO TABS: 10.0000 mg | ORAL_TABLET | Freq: Every day | ORAL | 3 refills | Status: DC

## 2018-08-14 NOTE — Telephone Encounter (Signed)
This RN received an email from research RN, Otilio Miu, that patient wanted to know when she would be registered for AFT-25 COMET clinical trial. Called patient to let her know that once this RN received confirmation from pathology and her mammogram results from [facility] this RN would be able to register her.

## 2018-08-14 NOTE — Progress Notes (Signed)
COMET: Consent I met with patient, who is here alone, after her scheduled appointment with Dr. Lindi Adie. Patient was previously provided with study information and study consent form to review, by research nurse Johney Maine. After discussing study with Dr. Lindi Adie and having all her questions answered, and reviewing information previously provided, patient expressed today that she wished to participate in the study and was ready to proceed with signing of the consent form. Before I started reviewing the consent form with the patient I inquired whether she had any questions and she denied having any. Patient and I proceeded to review the consent form, page by page, with patient initialing and dating each page as indicated and as we progressed. Patient confirms understanding that participation is completely voluntary; the purpose of the study; the usual treatment of DCIS; the randomization process and the two study groups; the length of the study; the required study procedures, including questionnaires; blood and tissue sample collection; possible risk and or benefits; any costs that may be associated with taking part; and how her medical information will be protected. Patient understands and has agreed to being randomized to either arm. Patient has also agreed to participate in the optional component of the study.  Patient completed the baseline questionnaire, today, after signing study consent, as well as, the Contact Information for Study Surveys form. Patient inquiring as to whether she will be registered and randomized today and I informed patient that it would not occur today and that Delana Meyer will contact her with further details. All of patient questions answered to her satisfaction, patient was thanked for her time and willingness to participate in the study and was encouraged to call Dr. Lindi Adie, [facility] or myself with any questions or concerns she may have.  Today, patient was provided with a copy of her  signed consent, a Clinical Trials Information pamphlet, the COMET trial information pamphlet and my contact information.  Approximately 90 minutes was spent consenting patient this morning.  Maxwell Marion, RN, BSN, [facility] Clinical Research 08/14/2018 12:18 PM

## 2018-08-14 NOTE — Assessment & Plan Note (Signed)
07/17/2018: Screening mammogram detected a 10cm span of calcifications in the left breast, biopsy confirmed intermediate grade DCIS, ER 100%, PR 100%.   Recommendation: 1.  Mastectomy left breast versus participation in [facility] clinical trial 2. Followed by antiestrogen therapy with tamoxifen 5 years

## 2018-08-16 ENCOUNTER — Telehealth: Payer: Self-pay

## 2018-08-16 NOTE — Telephone Encounter (Signed)
AFT-25  Called patient to inform her that she had been randomized to the active surveillance arm of the COMET trial. Patient expressed happiness with this arm. Explained to patient that she would then need a mammogram and visit with Dr. Lindi Adie in six months. Patient said that Dr. Lindi Adie had wanted her to followup with her in 3 months for her ET. Explained that this is not a research related visit, but said this RN would touch base with Dr. Lindi Adie and see when he wanted to see her next. Thanked patient for her time and participation. Johney Maine RN, BSN Clinical Research  08/16/18 2:32 PM

## 2018-08-16 NOTE — Progress Notes (Signed)
AFT 25 COMET Registration and Randomization  Patient signed informed consent on 08/14/2018 with research RN, Otilio Miu. Research RN, Otilio Miu, performed second eligibility review and confirmed that patient is eligible for COMET trial. Patient was registered and randomized to the active surveillance arm.

## 2018-08-18 ENCOUNTER — Encounter: Payer: Self-pay | Admitting: *Deleted

## 2018-08-18 ENCOUNTER — Encounter (HOSPITAL_COMMUNITY): Payer: Self-pay

## 2018-08-18 ENCOUNTER — Telehealth: Payer: Self-pay | Admitting: Hematology and Oncology

## 2018-08-18 DIAGNOSIS — R350 Frequency of micturition: Secondary | ICD-10-CM | POA: Diagnosis not present

## 2018-08-18 NOTE — Telephone Encounter (Signed)
Patient called back to request date and phone visit

## 2018-08-18 NOTE — Telephone Encounter (Signed)
I left a message regarding video visit  °

## 2018-08-19 ENCOUNTER — Encounter: Payer: Self-pay | Admitting: Family Medicine

## 2018-08-21 MED ORDER — ZOLPIDEM TARTRATE 10 MG PO TABS: 10.0000 mg | ORAL_TABLET | Freq: Every day | ORAL | 3 refills | Status: DC

## 2018-08-21 NOTE — Telephone Encounter (Signed)
Last OV 06/29/18 Zolpidem last filled 04/25/18 #30 with 3 meloxicam last filled 07/17/18 #30 with 0

## 2018-08-22 ENCOUNTER — Other Ambulatory Visit: Payer: Self-pay | Admitting: Family Medicine

## 2018-08-23 NOTE — Telephone Encounter (Signed)
Med was filled 07/17/18 #30 with 0 Ok to refill?

## 2018-08-24 ENCOUNTER — Telehealth: Payer: Self-pay | Admitting: Plastic Surgery

## 2018-08-24 NOTE — Telephone Encounter (Signed)
Called and spoke with patient regarding plans for reconstructive surgery. I informed the patient that she meets the Medicare guidelines for reconstruction and reduction/mastopexy based on diagnosis and treatment plan. Patient is currently participating in the COMET research trial for active surveillance and will keep in touch with Dr. Marla Roe and Dr. Marlou Starks once she has completed the 6 month arm of the trial. The patient is aware that if Dr. Lindi Adie feels that surgery is needed, he will contact Dr. Marla Roe and Dr. Marlou Starks, and we will proceed with the surgery scheduling process. Patient acknowledged understanding and will keep in contact accordingly.    CMS guidelines state the following:   National Coverage Determination (NCD)  140.2 - Breast Reconstruction Following Mastectomy (Rev. 1, 12-15-01); CIM [id number]         "Reconstruction of the affected and the contralateral unaffected breast following a medically necessary mastectomy is considered a relatively safe and effective noncosmetic procedure. Accordingly, program payment may be made for breast reconstruction surgery following removal of a breast for any medical reason."  Local Coverage Determination (LCD) for [place] ID  [id number] LCD Title - Group 4 and 5 Cosmetic and Reconstructive Surgery         "Medicare medical necessity for reduction mammoplasty is limited to circumstances in which:        .Marland KitchenMarland KitchenTo improve symmetry following cancer surgery on one breast."

## 2018-08-28 ENCOUNTER — Other Ambulatory Visit: Payer: Self-pay | Admitting: Hematology and Oncology

## 2018-08-28 DIAGNOSIS — C50312 Malignant neoplasm of lower-inner quadrant of left female breast: Secondary | ICD-10-CM

## 2018-08-30 DIAGNOSIS — N760 Acute vaginitis: Secondary | ICD-10-CM | POA: Diagnosis not present

## 2018-08-30 DIAGNOSIS — N952 Postmenopausal atrophic vaginitis: Secondary | ICD-10-CM | POA: Diagnosis not present

## 2018-08-30 DIAGNOSIS — C50112 Malignant neoplasm of central portion of left female breast: Secondary | ICD-10-CM | POA: Diagnosis not present

## 2018-08-30 DIAGNOSIS — N898 Other specified noninflammatory disorders of vagina: Secondary | ICD-10-CM | POA: Diagnosis not present

## 2018-08-31 ENCOUNTER — Telehealth: Payer: Self-pay | Admitting: Hematology and Oncology

## 2018-08-31 ENCOUNTER — Telehealth: Payer: Self-pay

## 2018-08-31 NOTE — Telephone Encounter (Signed)
Left message re 6/25 appointment. Schedule mailed.

## 2018-08-31 NOTE — Telephone Encounter (Signed)
LVM asking patient to call so this RN can schedule a lab appointment to get her research labs for COMET trial collected. Johney Maine RN, BSN Clinical Research  Johney Maine RN, BSN Clinical Research  08/31/18 11:12 AM

## 2018-08-31 NOTE — Telephone Encounter (Signed)
Returned patient's call about scheduling lab appt. Pt will be here on 6/25 for a urology appt. Requested for her lab appointment to be after her doctor's appt. This RN told her she would request that date with the scheduler's. Also told patient that Dr. Lindi Adie ordered her mammogram for December and they would contact her to schedule it. Thanked patient for her time and participation. Johney Maine RN, BSN Clinical Research 08/31/18 12:49 PM

## 2018-09-07 ENCOUNTER — Other Ambulatory Visit: Payer: Self-pay

## 2018-09-07 ENCOUNTER — Inpatient Hospital Stay: Payer: Medicare Other

## 2018-09-07 DIAGNOSIS — I480 Paroxysmal atrial fibrillation: Secondary | ICD-10-CM

## 2018-09-07 DIAGNOSIS — R3915 Urgency of urination: Secondary | ICD-10-CM | POA: Diagnosis not present

## 2018-09-07 DIAGNOSIS — C50312 Malignant neoplasm of lower-inner quadrant of left female breast: Secondary | ICD-10-CM

## 2018-09-12 ENCOUNTER — Encounter (HOSPITAL_COMMUNITY): Payer: Self-pay | Admitting: *Deleted

## 2018-09-12 LAB — RESEARCH LABS

## 2018-09-20 ENCOUNTER — Other Ambulatory Visit: Payer: Self-pay

## 2018-09-20 MED ORDER — TAMOXIFEN CITRATE 20 MG PO TABS: 20.0000 mg | ORAL_TABLET | Freq: Every day | ORAL | 0 refills | Status: DC

## 2018-09-21 ENCOUNTER — Other Ambulatory Visit: Payer: Self-pay | Admitting: Family Medicine

## 2018-09-21 ENCOUNTER — Other Ambulatory Visit (HOSPITAL_COMMUNITY): Payer: Self-pay | Admitting: *Deleted

## 2018-09-21 MED ORDER — METOPROLOL TARTRATE 25 MG PO TABS: ORAL_TABLET | ORAL | 3 refills | Status: DC

## 2018-09-25 DIAGNOSIS — C50112 Malignant neoplasm of central portion of left female breast: Secondary | ICD-10-CM | POA: Diagnosis not present

## 2018-09-25 DIAGNOSIS — N952 Postmenopausal atrophic vaginitis: Secondary | ICD-10-CM | POA: Diagnosis not present

## 2018-09-26 ENCOUNTER — Encounter (HOSPITAL_COMMUNITY): Payer: Self-pay | Admitting: Physician Assistant

## 2018-09-26 ENCOUNTER — Other Ambulatory Visit: Payer: Self-pay

## 2018-09-26 ENCOUNTER — Ambulatory Visit (HOSPITAL_COMMUNITY)
Admission: RE | Admit: 2018-09-26 | Discharge: 2018-09-26 | Disposition: A | Discharge status: Home / Self Care | Payer: Medicare Other | Source: Ambulatory Visit | Attending: Physician Assistant | Admitting: Physician Assistant

## 2018-09-26 VITALS — BP 148/82 | HR 54 | Ht 64.0 in | Wt 173.0 lb

## 2018-09-26 DIAGNOSIS — Z6829 Body mass index (BMI) 29.0-29.9, adult: Secondary | ICD-10-CM | POA: Insufficient documentation

## 2018-09-26 DIAGNOSIS — E669 Obesity, unspecified: Secondary | ICD-10-CM | POA: Insufficient documentation

## 2018-09-26 DIAGNOSIS — I480 Paroxysmal atrial fibrillation: Secondary | ICD-10-CM | POA: Diagnosis not present

## 2018-09-26 DIAGNOSIS — F419 Anxiety disorder, unspecified: Secondary | ICD-10-CM | POA: Insufficient documentation

## 2018-09-26 DIAGNOSIS — G4700 Insomnia, unspecified: Secondary | ICD-10-CM | POA: Diagnosis not present

## 2018-09-26 DIAGNOSIS — E785 Hyperlipidemia, unspecified: Secondary | ICD-10-CM | POA: Insufficient documentation

## 2018-09-26 DIAGNOSIS — G629 Polyneuropathy, unspecified: Secondary | ICD-10-CM | POA: Insufficient documentation

## 2018-09-26 DIAGNOSIS — Z79899 Other long term (current) drug therapy: Secondary | ICD-10-CM | POA: Diagnosis not present

## 2018-09-26 DIAGNOSIS — Z8249 Family history of ischemic heart disease and other diseases of the circulatory system: Secondary | ICD-10-CM | POA: Diagnosis not present

## 2018-09-26 NOTE — Progress Notes (Signed)
Primary Care Physician: [name], MD Primary Cardiologist: Dr Debara Pickett Referring Physician: [facility]   Lorraine [name] is a 68 y.o. female with a history of paroxysmal atrial fibrillation who presents for follow up in the [facility].  The patient was initially diagnosed with atrial fibrillation 04/14/18 after having a cystoscopy she developed atrial fibrillation with RVR HR 130s in PACU. She was asymptomatic during this episode. She was given 15 mg diltiazem and converted to NSR. She was then started on Eliquis and PRN metoprolol. Cardiac monitoring showed 4% AF burden with longest episode lasting 2.5 hours. She was unable to tolerate Eliquis 2/2 side effects and is now on Xarelto.  On follow up today, patient reports that she has done reasonably well. Patient stopped taking Xarelto because she was concerned that her tamoxifen would add to her symptoms of fatigue. She has intermittent palpitations which respond quickly to her PRN BB.   Today, she denies symptoms of chest pain, shortness of breath, orthopnea, PND, lower extremity edema, dizziness, presyncope, syncope, snoring, daytime somnolence, bleeding, or neurologic sequela. The patient is tolerating medications without difficulties and is otherwise without complaint today.    Atrial Fibrillation Risk Factors:  she does not have symptoms or diagnosis of sleep apnea. she does not have a history of rheumatic fever. she does not have a history of alcohol use. she has a BMI of Body mass index is 29.7 kg/m.Marland Kitchen Filed Weights   09/26/18 0840  Weight: 78.5 kg     Atrial Fibrillation Management history:  Previous antiarrhythmic drugs: none Previous cardioversions: none Previous ablations: none CHADS2VASC score: 3 (female, age, aortic plaque) Anticoagulation history: Eliquis, Xarelto   Past Medical History:  Diagnosis Date  . Anxiety   . Aortic atherosclerosis ([place]) 02/03/2018   Noted on CT  Abd/Pelvis  . AR (aortic regurgitation) 07/18/2017   Mild, noted on ECHO  . Bilateral cataracts   . Bilateral plantar fasciitis   . Chronic knee pain   . Chronic low back pain   . Chronic lumbar radiculopathy 07/14/2017   Mild, L5, noted on electromyography  . History of palpitations    2014-- low risk exercise tolerence test, no ischemi, PVC's (09-06-2012)  . HSV-1 (herpes simplex virus 1) infection    Cold sore  . Hx of colonic polyps   . Hyperlipidemia   . Insomnia   . Lactose intolerance   . Left nephrolithiasis 02/03/2018   Nonobstructing, noted on CT Abd/pelvis  . Morton's metatarsalgia, neuralgia, or neuroma, bilateral   . Osteoarthritis    knees and lumbar, feet  . Osteopenia   . Peripheral neuropathy    bilateral feet -- burning and stinging  . PONV (postoperative nausea and vomiting)    severe  . PVC's (premature ventricular contractions)   . Systolic murmur    "Slight"  . Vaginal cyst   . Wears glasses    Past Surgical History:  Procedure Laterality Date  . ANTERIOR CERVICAL DECOMP/DISCECTOMY FUSION  01/2014   C4 -- C6  . CATARACT EXTRACTION W/ INTRAOCULAR LENS  IMPLANT, BILATERAL  2015  . COLONOSCOPY  05/14/2015  . CYSTO WITH HYDRODISTENSION N/A 04/14/2018   Procedure: CYSTOSCOPY/HYDRODISTENSION WITH INSTILLATION OF PYRIDIUM AND MARCAINE, BLADDER BIOPSY WITH FULGERATION 0.5 TO 2 CM;  Surgeon: [name], MD;  Location: [facility];  Service: Urology;  Laterality: N/A;  . ELBOW DEBRIDEMENT Right 09/10/2010   and tendon debridement and radial tunnel release  . EXCISION MORTON'S  NEUROMA Bilateral left 02-20-2009/  right & left 05-08-2009  . EXCISION VAGINAL CYST N/A 07/01/2016   Procedure: EXCISION VAGINAL CYST;  Surgeon: Tyson Dense, MD;  Location: [facility];  Service: Gynecology;  Laterality: N/A;  . PLANTAR FASCIA RELEASE Bilateral left 04-02-2010/  right 04-02-2016  . TONSILLECTOMY AND ADENOIDECTOMY  child  .  TOTAL ABDOMINAL HYSTERECTOMY  1974   w/  Left salpingoophorectomy and Partial right salpingoophorectomy    Current Outpatient Medications  Medication Sig Dispense Refill  . ALPRAZolam (XANAX) 0.5 MG tablet Take 1 tablet (0.5 mg total) by mouth 2 (two) times daily as needed for anxiety. 60 tablet 1  . busPIRone (BUSPAR) 7.5 MG tablet Take 1 tablet (7.5 mg total) by mouth 2 (two) times daily. (Patient taking differently: Take 7.5 mg by mouth 2 (two) times daily. Usually takes 1/2 tab bid) 60 tablet 3  . ezetimibe (ZETIA) 10 MG tablet Take 1 tablet (10 mg total) by mouth daily. 90 tablet 3  . meloxicam (MOBIC) 15 MG tablet TAKE 1 TABLET BY MOUTH EVERY DAY 30 tablet 0  . metoprolol tartrate (LOPRESSOR) 25 MG tablet Take 1/2-1 tablet by mouth every 6-8hours for breakthrough afib for heart rates over 100 90 tablet 3  . polyvinyl alcohol (LIQUIFILM TEARS) 1.4 % ophthalmic solution Place 1 drop into both eyes as needed for dry eyes.    . tamoxifen (NOLVADEX) 20 MG tablet Take 1 tablet (20 mg total) by mouth daily. 90 tablet 0  . UNABLE TO FIND Magnesium Plus (PURE) 1 scoop (5.7G) 2-3 times weekly    . valACYclovir (VALTREX) 1000 MG tablet TAKE 1 TABLET BY MOUTH EVERY DAY (Patient taking differently: Take 1,000 mg by mouth daily as needed. ) 30 tablet 5  . zolpidem (AMBIEN) 10 MG tablet Take 1 tablet (10 mg total) by mouth at bedtime. 30 tablet 3  . VITAMIN D PO Take by mouth. Vit D3 5,000 IU     No current facility-administered medications for this encounter.     Allergies  Allergen Reactions  . Amitriptyline     Bladder retention  . Codeine Nausea And Vomiting  . Nortriptyline     Bladder retention     Social History   Socioeconomic History  . Marital status: Divorced    Spouse name: Not on file  . Number of children: 1  . Years of education: 42  . Highest education level: Not on file  Occupational History  . 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 Financial resource strain: Not on file  . Food insecurity    Worry: Not on file    Inability: Not on file  . Transportation needs    Medical: Not on file    Non-medical: Not on file  Tobacco Use  . Smoking status: Never Smoker  . Smokeless tobacco: Never Used  Substance and Sexual Activity  . Alcohol use: Yes    Comment: rare  . Drug use: No  . Sexual activity: Not on file  Lifestyle  . Physical activity    Days per week: Not on file    Minutes per session: Not on file  . Stress: Not on file  Relationships  . Social Herbalist on phone: Not on file    Gets together: Not on file    Attends religious service: Not on file    Active member of club or organization: Not on file  Attends meetings of clubs or organizations: Not on file    Relationship status: Not on file  . Intimate partner violence    Fear of current or ex partner: Not on file    Emotionally abused: Not on file    Physically abused: Not on file    Forced sexual activity: Not on file  Other Topics Concern  . Not on file  Social History Narrative   Lives alone in a one story home.  Has one son.  Works as a Counsellor for [employer].  Education: some college.     Family History  Problem Relation Age of Onset  . Lung cancer Mother        lung  . Heart attack Father   . Memory loss Father   . Cancer Paternal Grandfather        colon  . Multiple sclerosis Brother    The patient does not have a history of early familial atrial fibrillation or other arrhythmias.  ROS- All systems are reviewed and negative except as per the HPI above.  Physical Exam: Vitals:   09/26/18 0840  BP: (!) 148/82  Pulse: (!) 54  Weight: 78.5 kg  Height: 5\' 4"  (1.626 m)    GEN- The patient is well appearing, alert and oriented x 3 today.   HEENT-head normocephalic, atraumatic, sclera clear, conjunctiva pink, hearing intact, trachea midline. Lungs- Clear to ausculation bilaterally,  normal work of breathing Heart- Regular rate and rhythm, no murmurs, rubs or gallops  GI- soft, NT, ND, + BS Extremities- no clubbing, cyanosis, or edema MS- no significant deformity or atrophy Skin- no rash or lesion Psych- euthymic mood, full affect Neuro- strength and sensation are intact    Wt Readings from Last 3 Encounters:  09/26/18 78.5 kg  08/14/18 80.6 kg  07/31/18 80.6 kg    EKG today demonstrates SB HR 54, NST (baseline), PR 152, QRS 94, QTc 413  Echo 07/18/17 demonstrated  Left ventricle: The cavity size was normal. Wall thickness was   normal. Systolic function was normal. The estimated ejection   fraction was in the range of 55% to 60%. Doppler parameters are   consistent with both elevated ventricular end-diastolic filling   pressure and elevated left atrial filling pressure. - Aortic valve: There was mild regurgitation. - Atrial septum: No defect or patent foramen ovale was identified. LA 71mm  Long term monitor 06/05/18 4% AF burden, longest episode 2.5 hrs   Epic records are reviewed at length today  Assessment and Plan:  1. Paroxysmal atrial fibrillation The patient has paroxysmal atrial fibrillation. We discussed possibility of AAD if she has more frequent/severe symptoms. Patient prefers to try daily metoprolol 12.5 mg with extra dose PRN. We had an in depth discussion about the risks and benefits of anticoagulation and her stroke risk. Per guidelines, with a CHADS2VASC score of 3 she should be on long term anticoagulation. Discussed with pt that stopping Xarelto or taking intermittently increases her risk of stroke. Patient voices understanding and is agreeable to resume Xarelto 20 mg daily. Patient encouraged to call our office before stopping anticoagulation on her own.   This patients CHA2DS2-VASc Score and unadjusted Ischemic Stroke Rate (% per year) is equal to 3.2 % stroke rate/year from a score of 3  Above score calculated as 1 point each if  present [CHF, HTN, DM, Vascular=MI/PAD/Aortic Plaque, Age if 65-74, or Female] Above score calculated as 2 points each if present [Age > 75, or Stroke/TIA/TE]  2. DCIS Plans per oncology. Patient in COMET trial.  3. Obesity Body mass index is 29.7 kg/m.  Lifestyle modification was discussed and encouraged including regular physical activity and weight reduction.  4. HLD Continue Zetia. Plans per PCP.   Follow up in AF clinic in 4 months.   [facility] [address] [place], [place] [address] [phone]

## 2018-09-28 ENCOUNTER — Encounter: Payer: Self-pay | Admitting: Family Medicine

## 2018-09-29 ENCOUNTER — Other Ambulatory Visit: Payer: Self-pay

## 2018-09-29 ENCOUNTER — Ambulatory Visit (INDEPENDENT_AMBULATORY_CARE_PROVIDER_SITE_OTHER): Payer: Medicare Other | Admitting: Family Medicine

## 2018-09-29 ENCOUNTER — Encounter: Payer: Self-pay | Admitting: Family Medicine

## 2018-09-29 VITALS — BP 123/71 | HR 62 | Ht 64.5 in | Wt 172.0 lb

## 2018-09-29 DIAGNOSIS — L255 Unspecified contact dermatitis due to plants, except food: Secondary | ICD-10-CM

## 2018-09-29 NOTE — Progress Notes (Signed)
Virtual Visit via Video   I connected with patient on 09/29/18 at  4:00 PM EDT by a video enabled telemedicine application and verified that I am speaking with the correct person using two identifiers.  Location patient: Home Location provider: Acupuncturist, Office Persons participating in the virtual visit: Patient, Provider, [place] (Jess B)  I discussed the limitations of evaluation and management by telemedicine and the availability of in person appointments. The patient expressed understanding and agreed to proceed.  Subjective:   HPI:   [place]- pt was mowing and trimming bushes Monday/Tuesday.  Yesterday AM pt awoke w/ redness of neck w/ some vesicles.  Pt has hx of Afib.  Was asking to start Prednisone.  ROS:   See pertinent positives and negatives per HPI.  Patient Active Problem List   Diagnosis Date Noted  . Malignant neoplasm of lower-inner quadrant of left breast in female, estrogen receptor positive ([place]) 07/26/2018  . Anxiety about health 05/31/2018  . A-fib ([place]) 05/18/2018  . Obesity (BMI 30-39.9) 05/18/2018  . Systolic murmur 38/45/3646  . Shingles 11/23/2016  . Arthritis 05/26/2016  . Vaginal pain 05/26/2016  . Insomnia 05/26/2016  . Neuropathy 05/26/2016  . Positive ANA (antinuclear antibody) 05/26/2016  . Hyperlipidemia 05/26/2016  . Neck and shoulder pain 01/26/2014  . Cervicalgia 01/26/2014  . Palpitations 08/31/2012  . METATARSALGIA 05/06/2010  . PLANTAR FASCIITIS, LEFT 05/06/2010  . FOOT PAIN, BILATERAL 05/06/2010    Social History   Tobacco Use  . Smoking status: Never Smoker  . Smokeless tobacco: Never Used  Substance Use Topics  . Alcohol use: Yes    Comment: rare    Current Outpatient Medications:  .  ALPRAZolam (XANAX) 0.5 MG tablet, Take 1 tablet (0.5 mg total) by mouth 2 (two) times daily as needed for anxiety., Disp: 60 tablet, Rfl: 1 .  busPIRone (BUSPAR) 7.5 MG tablet, Take 1 tablet (7.5 mg total) by mouth 2 (two)  times daily. (Patient taking differently: Take 7.5 mg by mouth 2 (two) times daily. Usually takes 1/2 tab bid), Disp: 60 tablet, Rfl: 3 .  ezetimibe (ZETIA) 10 MG tablet, Take 1 tablet (10 mg total) by mouth daily., Disp: 90 tablet, Rfl: 3 .  meloxicam (MOBIC) 15 MG tablet, TAKE 1 TABLET BY MOUTH EVERY DAY, Disp: 30 tablet, Rfl: 0 .  metoprolol tartrate (LOPRESSOR) 25 MG tablet, Take 1/2-1 tablet by mouth every 6-8hours for breakthrough afib for heart rates over 100, Disp: 90 tablet, Rfl: 3 .  oxybutynin (DITROPAN-XL) 5 MG 24 hr tablet, Take 5 mg by mouth at bedtime., Disp: , Rfl:  .  polyvinyl alcohol (LIQUIFILM TEARS) 1.4 % ophthalmic solution, Place 1 drop into both eyes as needed for dry eyes., Disp: , Rfl:  .  rivaroxaban (XARELTO) 20 MG TABS tablet, Xarelto 20 mg tablet  TAKE 1 TABLET (20 MG TOTAL) BY MOUTH DAILY WITH SUPPER., Disp: , Rfl:  .  tamoxifen (NOLVADEX) 20 MG tablet, Take 1 tablet (20 mg total) by mouth daily., Disp: 90 tablet, Rfl: 0 .  valACYclovir (VALTREX) 1000 MG tablet, TAKE 1 TABLET BY MOUTH EVERY DAY (Patient taking differently: Take 1,000 mg by mouth daily as needed. ), Disp: 30 tablet, Rfl: 5 .  VITAMIN D PO, Take by mouth. Vit D3 5,000 IU, Disp: , Rfl:  .  zolpidem (AMBIEN) 10 MG tablet, Take 1 tablet (10 mg total) by mouth at bedtime., Disp: 30 tablet, Rfl: 3 .  UNABLE TO FIND, Magnesium Plus (PURE) 1 scoop (5.7G)  2-3 times weekly, Disp: , Rfl:   Allergies  Allergen Reactions  . Amitriptyline     Bladder retention  . Codeine Nausea And Vomiting  . Nortriptyline     Bladder retention     Objective:   BP 123/71   Pulse 62   Ht 5' 4.5" (1.638 m)   Wt 172 lb (78 kg)   BMI 29.07 kg/m   AAOx3, NAD NCAT, EOMI No obvious CN deficits Coloring WNL Redness of neck and a few vesicles present Pt is able to speak clearly, coherently without shortness of breath or increased work of breathing.  Thought process is linear.  Mood is appropriate.   Assessment and  Plan:   Plant dermatitis- new.  Given pt's hx of Afib will not start Prednisone but rather encouraged her to use the Triamcinolone ointment that she has available at home.  To start OTC antihistamine for the itching.  Topical benadryl prn.  Pt expressed understanding and is in agreement w/ plan.    Annye Asa, MD 09/29/2018

## 2018-09-29 NOTE — Progress Notes (Signed)
I have discussed the procedure for the virtual visit with the patient who has given consent to proceed with assessment and treatment.   Valisa Karpel L Jaxsyn Azam, CMA     

## 2018-10-02 ENCOUNTER — Ambulatory Visit: Payer: Medicare Other | Admitting: Family Medicine

## 2018-10-05 DIAGNOSIS — R3915 Urgency of urination: Secondary | ICD-10-CM | POA: Diagnosis not present

## 2018-10-05 DIAGNOSIS — R350 Frequency of micturition: Secondary | ICD-10-CM | POA: Diagnosis not present

## 2018-10-07 ENCOUNTER — Encounter: Payer: Self-pay | Admitting: Family Medicine

## 2018-10-09 MED ORDER — TRIAMCINOLONE ACETONIDE 0.1 % EX OINT: 1.0000 "application " | TOPICAL_OINTMENT | Freq: Two times a day (BID) | CUTANEOUS | 1 refills | Status: DC

## 2018-10-17 ENCOUNTER — Other Ambulatory Visit: Payer: Self-pay | Admitting: Family Medicine

## 2018-10-24 DIAGNOSIS — R3915 Urgency of urination: Secondary | ICD-10-CM | POA: Diagnosis not present

## 2018-10-24 DIAGNOSIS — R350 Frequency of micturition: Secondary | ICD-10-CM | POA: Diagnosis not present

## 2018-10-25 ENCOUNTER — Other Ambulatory Visit: Payer: Self-pay | Admitting: Family Medicine

## 2018-10-27 ENCOUNTER — Encounter: Payer: Self-pay | Admitting: Family Medicine

## 2018-10-27 DIAGNOSIS — N760 Acute vaginitis: Secondary | ICD-10-CM | POA: Diagnosis not present

## 2018-10-27 DIAGNOSIS — N952 Postmenopausal atrophic vaginitis: Secondary | ICD-10-CM | POA: Diagnosis not present

## 2018-10-27 MED ORDER — ALPRAZOLAM 0.5 MG PO TABS: 0.5000 mg | ORAL_TABLET | Freq: Two times a day (BID) | ORAL | 1 refills | Status: DC | PRN

## 2018-10-27 NOTE — Telephone Encounter (Signed)
Last OV 09/29/18 Alprazolam last filled 05/31/18 #60 with 1

## 2018-11-01 ENCOUNTER — Telehealth: Payer: Self-pay

## 2018-11-01 DIAGNOSIS — N898 Other specified noninflammatory disorders of vagina: Secondary | ICD-10-CM | POA: Diagnosis not present

## 2018-11-01 NOTE — Telephone Encounter (Signed)
Patient called this RN to explain that she has had a lot of issues with the tamoxifen and has decided to stop it. She wanted to let Dr. Geralyn Flash nurse know. Explained to patient that this RN is not Dr. Geralyn Flash desk nurse but a nurse with the clinical research office. This RN deals with patient's enrollment with the AFT-25 COMET trial. However, told patient that this RN would pass on the message to Dr. Lindi Adie. Patient said she hadn't heard anything from the study since she enrolled. This RN explained that this is normal and once she got closer to her scheduled mammogram in December that the survey required for the study would be emailed to her. Patient verbalized understanding. Johney Maine RN, BSN Clinical Research  11/01/18 1:43 PM

## 2018-11-02 DIAGNOSIS — N3010 Interstitial cystitis (chronic) without hematuria: Secondary | ICD-10-CM | POA: Diagnosis not present

## 2018-11-07 DIAGNOSIS — N3010 Interstitial cystitis (chronic) without hematuria: Secondary | ICD-10-CM | POA: Diagnosis not present

## 2018-11-09 NOTE — Assessment & Plan Note (Signed)
07/17/2018:Screening mammogram detected a 10cm span of calcifications in the left breast, biopsy confirmed intermediate grade DCIS, ER 100%, PR 100% Stage 0  Current treatment:  1. COMET clinical trial randomized to active surveillance arm 2.  Tamoxifen 20 mg daily started 09/20/2018  Tamoxifen toxicities:  Breast cancer surveillance: Mammogram scheduled for 02/16/2019. Return to clinic after the mammogram to discuss the results.

## 2018-11-10 DIAGNOSIS — R350 Frequency of micturition: Secondary | ICD-10-CM | POA: Diagnosis not present

## 2018-11-10 DIAGNOSIS — R3915 Urgency of urination: Secondary | ICD-10-CM | POA: Diagnosis not present

## 2018-11-15 ENCOUNTER — Other Ambulatory Visit: Payer: Self-pay

## 2018-11-15 ENCOUNTER — Encounter: Payer: Self-pay | Admitting: Family Medicine

## 2018-11-15 ENCOUNTER — Ambulatory Visit (INDEPENDENT_AMBULATORY_CARE_PROVIDER_SITE_OTHER): Payer: Medicare Other | Admitting: Family Medicine

## 2018-11-15 ENCOUNTER — Telehealth: Payer: Self-pay | Admitting: Hematology and Oncology

## 2018-11-15 VITALS — BP 120/80 | HR 66 | Temp 98.1°F | Resp 16 | Ht 65.0 in | Wt 177.4 lb

## 2018-11-15 DIAGNOSIS — E663 Overweight: Secondary | ICD-10-CM

## 2018-11-15 DIAGNOSIS — I4891 Unspecified atrial fibrillation: Secondary | ICD-10-CM

## 2018-11-15 DIAGNOSIS — E785 Hyperlipidemia, unspecified: Secondary | ICD-10-CM | POA: Diagnosis not present

## 2018-11-15 DIAGNOSIS — F418 Other specified anxiety disorders: Secondary | ICD-10-CM

## 2018-11-15 DIAGNOSIS — Z23 Encounter for immunization: Secondary | ICD-10-CM

## 2018-11-15 LAB — BASIC METABOLIC PANEL
BUN: 20 mg/dL (ref 6–23)
CO2: 26 mEq/L (ref 19–32)
Calcium: 9.5 mg/dL (ref 8.4–10.5)
Chloride: 106 mEq/L (ref 96–112)
Creatinine, Ser: 0.71 mg/dL (ref 0.40–1.20)
GFR: 81.77 mL/min (ref >60.00)
Glucose, Bld: 91 mg/dL (ref 70–99)
Potassium: 4.7 mEq/L (ref 3.5–5.1)
Sodium: 140 mEq/L (ref 135–145)

## 2018-11-15 LAB — LIPID PANEL
Cholesterol: 175 mg/dL (ref 0–200)
HDL: 47 mg/dL (ref >39.00)
LDL Cholesterol: 116 mg/dL — ABNORMAL HIGH (ref 0–99)
NonHDL: 127.76
Total CHOL/HDL Ratio: 4
Triglycerides: 58 mg/dL (ref 0.0–149.0)
VLDL: 11.6 mg/dL (ref 0.0–40.0)

## 2018-11-15 LAB — CBC WITH DIFFERENTIAL/PLATELET
Basophils Absolute: 0.1 10*3/uL (ref 0.0–0.1)
Basophils Relative: 1.3 % (ref 0.0–3.0)
Eosinophils Absolute: 0.3 10*3/uL (ref 0.0–0.7)
Eosinophils Relative: 4.6 % (ref 0.0–5.0)
HCT: 43.4 % (ref 36.0–46.0)
Hemoglobin: 14.4 g/dL (ref 12.0–15.0)
Lymphocytes Relative: 36.8 % (ref 12.0–46.0)
Lymphs Abs: 2.3 10*3/uL (ref 0.7–4.0)
MCHC: 33.1 g/dL (ref 30.0–36.0)
MCV: 87 fl (ref 78.0–100.0)
Monocytes Absolute: 0.4 10*3/uL (ref 0.1–1.0)
Monocytes Relative: 6.2 % (ref 3.0–12.0)
Neutro Abs: 3.2 10*3/uL (ref 1.4–7.7)
Neutrophils Relative %: 51.1 % (ref 43.0–77.0)
Platelets: 249 10*3/uL (ref 150.0–400.0)
RBC: 5 Mil/uL (ref 3.87–5.11)
RDW: 15.2 % (ref 11.5–15.5)
WBC: 6.2 10*3/uL (ref 4.0–10.5)

## 2018-11-15 LAB — HEPATIC FUNCTION PANEL
ALT: 16 U/L (ref 0–35)
AST: 14 U/L (ref 0–37)
Albumin: 4.2 g/dL (ref 3.5–5.2)
Alkaline Phosphatase: 80 U/L (ref 39–117)
Bilirubin, Direct: 0.1 mg/dL (ref 0.0–0.3)
Total Bilirubin: 0.6 mg/dL (ref 0.2–1.2)
Total Protein: 6.8 g/dL (ref 6.0–8.3)

## 2018-11-15 LAB — TSH: TSH: 1.41 u[IU]/mL (ref 0.35–4.50)

## 2018-11-15 MED ORDER — MELOXICAM 15 MG PO TABS: 15.0000 mg | ORAL_TABLET | Freq: Every day | ORAL | 1 refills | Status: DC

## 2018-11-15 MED ORDER — ZOLPIDEM TARTRATE 10 MG PO TABS: 10.0000 mg | ORAL_TABLET | Freq: Every day | ORAL | 3 refills | Status: DC

## 2018-11-15 NOTE — Assessment & Plan Note (Signed)
Improving.  Pt feels she is in a better place and is ready to take on her medical challenges.  Now taking the Buspar and Alprazolam PRN.  Will continue to follow.

## 2018-11-15 NOTE — Patient Instructions (Signed)
Follow up in 6 months to recheck cholesterol We'll notify you of your lab results and make any changes if needed Keep up the good work on healthy diet and regular exercise- you look great!!! Call with any questions or concerns Stay Safe!!!

## 2018-11-15 NOTE — Assessment & Plan Note (Signed)
Pt had been losing weight but then gained 5 lbs while starting cancer tx.  Discussed need for healthy diet and regular exercise.  Will follow.

## 2018-11-15 NOTE — Progress Notes (Signed)
  HEMATOLOGY-ONCOLOGY TELEPHONE VISIT PROGRESS NOTE  I connected with Lorraine [name] on 11/16/2018 at  8:30 AM EDT by telephone and verified that I am speaking with the correct person using two identifiers.  I discussed the limitations, risks, security and privacy concerns of performing an evaluation and management service by telephone and the availability of in person appointments.  I also discussed with the patient that there may be a patient responsible charge related to this service. The patient expressed understanding and agreed to proceed.   History of Present Illness: Lorraine [name] is a 68 y.o. female with above-mentioned history of left breast DCIS currently on the COMET clinical trial, randomized to the active surveillance arm. She was started on tamoxifen but discontinued it in 10/2018 due to side effects. She presents over the phone today for 23-month follow-up.   Oncology History  Malignant neoplasm of lower-inner quadrant of left breast in female, estrogen receptor positive ([place])  07/26/2018 Initial Diagnosis   Screening mammogram detected a 10cm span of calcifications in the left breast, biopsy confirmed intermediate grade DCIS, ER 100%, PR 100%.    07/26/2018 Cancer Staging   Staging form: Breast, AJCC 8th Edition - Clinical: Stage 0 (cTis (DCIS), cN0, cM0, ER+, PR+) - Signed by Gardenia Phlegm, NP on 07/26/2018     Observations/Objective:  No clinical evidence of progression.   Assessment Plan:  Malignant neoplasm of lower-inner quadrant of left breast in female, estrogen receptor positive ([place]) 07/17/2018:Screening mammogram detected a 10cm span of calcifications in the left breast, biopsy confirmed intermediate grade DCIS, ER 100%, PR 100% Stage 0  Current treatment:  1. COMET clinical trial randomized to active surveillance arm 2.  Tamoxifen 20 mg daily started 09/20/2018  Tamoxifen toxicities: Hot flashes Stopped 11/01/18 due to vaginal infection and vaginal  dryness, improved initially and repeat infection caused her to stop. Still having Itching MonaLisa treatment 11/16/18  Breast cancer surveillance: Mammogram scheduled for 02/16/2019. Return to clinic after the mammogram to discuss the results and then decide if Tamoxifen can be resumed or Anastrozole   I discussed the assessment and treatment plan with the patient. The patient was provided an opportunity to ask questions and all were answered. The patient agreed with the plan and demonstrated an understanding of the instructions. The patient was advised to call back or seek an in-person evaluation if the symptoms worsen or if the condition fails to improve as anticipated.   I provided 15 minutes of non-face-to-face time during this encounter.   Rulon Eisenmenger, MD 11/16/2018    I, Molly Dorshimer, am acting as scribe for Nicholas Lose, MD.  I have reviewed the above documentation for accuracy and completeness, and I agree with the above.

## 2018-11-15 NOTE — Assessment & Plan Note (Signed)
Chronic problem.  Pt doing well on Zetia but would like to change medication due to cost.  Will get labs to determine appropriate dose of statin.  Will use Crestor or Pravastatin as pt had some side effects w/ Lipitor.  Pt expressed understanding and is in agreement w/ plan.

## 2018-11-15 NOTE — Progress Notes (Signed)
   Subjective:    Patient ID: Lorraine [name], female    DOB: Jul 16, 1950, 68 y.o.   MRN: [id number]  HPI Hyperlipidemia- chronic problem, on Zetia 10mg  daily.  Denies abd pain, N/V.  Pt would like to restart statin due to cost.  Overweight- pt has gained 5 lbs since visit on 7/17.  Pt has been through quite a bit.  She is working on diet and exercise.  Afib- chronic problem, rate controlled on Metoprolol and on Xarelto daily.  Pt states she is on a 'Xarelto vacation'.  Currently asymptomatic.  Denies CP, SOB, palpitations, edema.  Anxiety about health- pt was dx'd w/ breast cancer.  On Buspar BID and Alprazolam prn.  Tamoxifen is currently on hold (since 8/19) b/c of vaginal infxns and bladder issues.  Has appt w/ Dr Lindi Adie tomorrow.  Needs refill on Ambien   Review of Systems For ROS see HPI     Objective:   Physical Exam Vitals signs reviewed.  Constitutional:      General: She is not in acute distress.    Appearance: Normal appearance. She is well-developed.  HENT:     Head: Normocephalic and atraumatic.  Eyes:     Conjunctiva/sclera: Conjunctivae normal.     Pupils: Pupils are equal, round, and reactive to light.  Neck:     Musculoskeletal: Normal range of motion and neck supple.     Thyroid: No thyromegaly.  Cardiovascular:     Rate and Rhythm: Normal rate and regular rhythm.     Heart sounds: Normal heart sounds. No murmur.  Pulmonary:     Effort: Pulmonary effort is normal. No respiratory distress.     Breath sounds: Normal breath sounds.  Abdominal:     General: There is no distension.     Palpations: Abdomen is soft.     Tenderness: There is no abdominal tenderness.  Lymphadenopathy:     Cervical: No cervical adenopathy.  Skin:    General: Skin is warm and dry.  Neurological:     Mental Status: She is alert and oriented to person, place, and time.  Psychiatric:        Mood and Affect: Mood normal.        Behavior: Behavior normal.        Thought Content:  Thought content normal.        Judgment: Judgment normal.           Assessment & Plan:

## 2018-11-15 NOTE — Assessment & Plan Note (Signed)
Chronic problem.  Following w/ Afib clinic.  Has not required Metoprolol in the last month.  Is on a Xarelto vacation right now until her bladder issues are resolved.  Will continue to follow.

## 2018-11-16 ENCOUNTER — Inpatient Hospital Stay: Payer: Medicare Other | Attending: Hematology and Oncology | Admitting: Hematology and Oncology

## 2018-11-16 ENCOUNTER — Encounter: Payer: Self-pay | Admitting: Family Medicine

## 2018-11-16 ENCOUNTER — Other Ambulatory Visit: Payer: Self-pay | Admitting: General Practice

## 2018-11-16 DIAGNOSIS — Z170 Estrogen receptor positive status [ER+]: Secondary | ICD-10-CM | POA: Diagnosis not present

## 2018-11-16 DIAGNOSIS — C50312 Malignant neoplasm of lower-inner quadrant of left female breast: Secondary | ICD-10-CM

## 2018-11-16 MED ORDER — ROSUVASTATIN CALCIUM 10 MG PO TABS: 10.0000 mg | ORAL_TABLET | Freq: Every day | ORAL | 3 refills | Status: DC

## 2018-11-17 ENCOUNTER — Telehealth: Payer: Self-pay | Admitting: Hematology and Oncology

## 2018-11-17 ENCOUNTER — Telehealth: Payer: Medicare Other | Admitting: Hematology and Oncology

## 2018-11-17 NOTE — Telephone Encounter (Signed)
I talk with patient regarding 12/9

## 2018-11-21 DIAGNOSIS — H35363 Drusen (degenerative) of macula, bilateral: Secondary | ICD-10-CM | POA: Diagnosis not present

## 2018-11-29 DIAGNOSIS — M7062 Trochanteric bursitis, left hip: Secondary | ICD-10-CM | POA: Diagnosis not present

## 2018-12-04 DIAGNOSIS — M19072 Primary osteoarthritis, left ankle and foot: Secondary | ICD-10-CM | POA: Diagnosis not present

## 2018-12-04 DIAGNOSIS — M19071 Primary osteoarthritis, right ankle and foot: Secondary | ICD-10-CM | POA: Diagnosis not present

## 2018-12-13 ENCOUNTER — Other Ambulatory Visit: Payer: Self-pay

## 2018-12-13 ENCOUNTER — Ambulatory Visit (INDEPENDENT_AMBULATORY_CARE_PROVIDER_SITE_OTHER): Payer: Medicare Other | Admitting: Family Medicine

## 2018-12-13 ENCOUNTER — Encounter: Payer: Self-pay | Admitting: Family Medicine

## 2018-12-13 VITALS — BP 129/80 | HR 57 | Temp 97.6°F | Ht 65.0 in | Wt 178.0 lb

## 2018-12-13 DIAGNOSIS — B9689 Other specified bacterial agents as the cause of diseases classified elsewhere: Secondary | ICD-10-CM | POA: Diagnosis not present

## 2018-12-13 DIAGNOSIS — J329 Chronic sinusitis, unspecified: Secondary | ICD-10-CM

## 2018-12-13 DIAGNOSIS — H6692 Otitis media, unspecified, left ear: Secondary | ICD-10-CM

## 2018-12-13 MED ORDER — AMOXICILLIN 875 MG PO TABS: 875.0000 mg | ORAL_TABLET | Freq: Two times a day (BID) | ORAL | 0 refills | Status: DC

## 2018-12-13 NOTE — Progress Notes (Signed)
I have discussed the procedure for the virtual visit with the patient who has given consent to proceed with assessment and treatment.   Jessica L Brodmerkel, CMA     

## 2018-12-13 NOTE — Progress Notes (Signed)
Virtual Visit via Video   I connected with patient on 12/13/18 at  2:00 PM EDT by a video enabled telemedicine application and verified that I am speaking with the correct person using two identifiers.  Location patient: Home Location provider: Acupuncturist, Office Persons participating in the virtual visit: Patient, Provider, [place] (Jess B)  I discussed the limitations of evaluation and management by telemedicine and the availability of in person appointments. The patient expressed understanding and agreed to proceed.  Subjective:   HPI:   Ear pain- sxs started 3 days ago.  L side.  Pain is now traveling down neck.  Pain w/ chewing.  Today developed HA on L side of face.  Earache is very unusual for pt.  No fever.  No drainage.  Some 'popping' of ear.  Pt reports L side of face feels swollen.  'it feels a little like strep throat'.  No pain w/ manipulation of pinna.  No hx of seasonal allergies.  ROS:   See pertinent positives and negatives per HPI.  Patient Active Problem List   Diagnosis Date Noted  . Malignant neoplasm of lower-inner quadrant of left breast in female, estrogen receptor positive ([place]) 07/26/2018  . Anxiety about health 05/31/2018  . A-fib ([place]) 05/18/2018  . Overweight (BMI 25.0-29.9) 05/18/2018  . Systolic murmur 99991111  . Shingles 11/23/2016  . Arthritis 05/26/2016  . Vaginal pain 05/26/2016  . Insomnia 05/26/2016  . Neuropathy 05/26/2016  . Positive ANA (antinuclear antibody) 05/26/2016  . Hyperlipidemia 05/26/2016  . Neck and shoulder pain 01/26/2014  . Cervicalgia 01/26/2014  . Palpitations 08/31/2012  . METATARSALGIA 05/06/2010  . PLANTAR FASCIITIS, LEFT 05/06/2010  . FOOT PAIN, BILATERAL 05/06/2010    Social History   Tobacco Use  . Smoking status: Never Smoker  . Smokeless tobacco: Never Used  Substance Use Topics  . Alcohol use: Yes    Comment: rare    Current Outpatient Medications:  .  ALPRAZolam (XANAX) 0.5 MG tablet,  Take 1 tablet (0.5 mg total) by mouth 2 (two) times daily as needed for anxiety., Disp: 60 tablet, Rfl: 1 .  busPIRone (BUSPAR) 7.5 MG tablet, TAKE 1 TABLET (7.5 MG TOTAL) BY MOUTH 2 (TWO) TIMES DAILY., Disp: 60 tablet, Rfl: 3 .  ezetimibe (ZETIA) 10 MG tablet, Take 1 tablet (10 mg total) by mouth daily., Disp: 90 tablet, Rfl: 3 .  meloxicam (MOBIC) 15 MG tablet, Take 1 tablet (15 mg total) by mouth daily., Disp: 30 tablet, Rfl: 1 .  oxybutynin (DITROPAN-XL) 5 MG 24 hr tablet, Take 5 mg by mouth at bedtime., Disp: , Rfl:  .  polyvinyl alcohol (LIQUIFILM TEARS) 1.4 % ophthalmic solution, Place 1 drop into both eyes as needed for dry eyes., Disp: , Rfl:  .  rivaroxaban (XARELTO) 20 MG TABS tablet, Xarelto 20 mg tablet  TAKE 1 TABLET (20 MG TOTAL) BY MOUTH DAILY WITH SUPPER., Disp: , Rfl:  .  rosuvastatin (CRESTOR) 10 MG tablet, Take 1 tablet (10 mg total) by mouth daily., Disp: 30 tablet, Rfl: 3 .  tamoxifen (NOLVADEX) 20 MG tablet, Take 1 tablet (20 mg total) by mouth daily., Disp: 90 tablet, Rfl: 0 .  valACYclovir (VALTREX) 1000 MG tablet, TAKE 1 TABLET BY MOUTH EVERY DAY, Disp: 30 tablet, Rfl: 5 .  zolpidem (AMBIEN) 10 MG tablet, Take 1 tablet (10 mg total) by mouth at bedtime., Disp: 30 tablet, Rfl: 3 .  metoprolol tartrate (LOPRESSOR) 25 MG tablet, Take 1/2-1 tablet by mouth every 6-8hours for  breakthrough afib for heart rates over 100 (Patient not taking: Reported on 12/13/2018), Disp: 90 tablet, Rfl: 3  Allergies  Allergen Reactions  . Amitriptyline     Bladder retention  . Codeine Nausea And Vomiting  . Nortriptyline     Bladder retention     Objective:   BP 129/80   Pulse (!) 57   Temp 97.6 F (36.4 C) (Oral)   Ht 5\' 5"  (1.651 m)   Wt 178 lb (80.7 kg)   BMI 29.62 kg/m  AAOx3, NAD NCAT, EOMI No obvious CN deficits No TTP over L temporal artery + TTP over L frontal and maxillary sinus Coloring WNL Pt is able to speak clearly, coherently without shortness of breath or  increased work of breathing.  Thought process is linear.  Mood is appropriate.   Assessment and Plan:   L OM/bacterial sinusitis- new.  Pt's sxs and PE consistent w/ infxn.  Start Amox.  Encouraged ibuprofen/tylenol PRN HA.  Pt to use warm washcloth across sinuses for symptomatic relief.  Reviewed supportive care and red flags that should prompt return. Pt expressed understanding and is in agreement w/ plan.    Annye Asa, MD 12/13/2018

## 2018-12-14 DIAGNOSIS — R350 Frequency of micturition: Secondary | ICD-10-CM | POA: Diagnosis not present

## 2019-01-13 ENCOUNTER — Other Ambulatory Visit: Payer: Self-pay | Admitting: Family Medicine

## 2019-01-19 DIAGNOSIS — R350 Frequency of micturition: Secondary | ICD-10-CM | POA: Diagnosis not present

## 2019-01-25 DIAGNOSIS — N3010 Interstitial cystitis (chronic) without hematuria: Secondary | ICD-10-CM | POA: Diagnosis not present

## 2019-02-07 ENCOUNTER — Other Ambulatory Visit: Payer: Self-pay | Admitting: Family Medicine

## 2019-02-07 NOTE — Telephone Encounter (Signed)
Last refill: 8.14.20 #60, 1 Last OV: 9.30.20 dx. Ear pain

## 2019-02-14 ENCOUNTER — Telehealth: Payer: Self-pay | Admitting: *Deleted

## 2019-02-14 NOTE — Telephone Encounter (Signed)
02/14/19 at 1:45pm - COMET study note- The research nurse called the pt to introduce herself as the pt's new COMET research nurse.  The pt was informed that Johney Maine is no longer working at the [employer].  The pt was given the nurse's name and phone number to contact for any questions/concerns in the future.  The pt was also informed that her month 6 questionnaire was emailed to her today.  The pt was told that she will need to complete it by 04/11/19.  The pt was concerned that she might have deleted her study email. The pt was emailed with instructions on how to complete her questionnaire if she has deleted her study link.  The pt was also told that the study has some new changes (protocol version 6).  The pt was informed about several new changes in the consent form.  The pt was emailed a copy of the latest consent form to read before her follow up visit on 02/21/19.  The pt confirmed that she will go for her study required mammogram on 02/16/19.  The [place] has been alerted to read this pt's mammogram using the COMET protocol for reference.  The pt was thanked for continued support of the COMET trial.  The pt thanked the nurse for her call regarding her upcoming appointments and her participation in the study.   Brion Aliment RN, BSN, CCRP Clinical Research Nurse 02/14/2019 1:52 PM

## 2019-02-16 ENCOUNTER — Other Ambulatory Visit: Payer: Self-pay

## 2019-02-16 ENCOUNTER — Other Ambulatory Visit: Payer: Self-pay | Admitting: Hematology and Oncology

## 2019-02-16 ENCOUNTER — Ambulatory Visit
Admission: RE | Admit: 2019-02-16 | Discharge: 2019-02-16 | Disposition: A | Discharge status: Home / Self Care | Payer: Medicare Other | Source: Ambulatory Visit | Attending: Hematology and Oncology | Admitting: Hematology and Oncology

## 2019-02-16 DIAGNOSIS — Z170 Estrogen receptor positive status [ER+]: Secondary | ICD-10-CM

## 2019-02-16 DIAGNOSIS — C50312 Malignant neoplasm of lower-inner quadrant of left female breast: Secondary | ICD-10-CM

## 2019-02-16 DIAGNOSIS — R921 Mammographic calcification found on diagnostic imaging of breast: Secondary | ICD-10-CM | POA: Diagnosis not present

## 2019-02-17 ENCOUNTER — Encounter: Payer: Self-pay | Admitting: Family Medicine

## 2019-02-19 MED ORDER — ALPRAZOLAM 0.5 MG PO TABS: 0.5000 mg | ORAL_TABLET | Freq: Two times a day (BID) | ORAL | 1 refills | Status: DC | PRN

## 2019-02-20 ENCOUNTER — Encounter: Payer: Self-pay | Admitting: *Deleted

## 2019-02-20 NOTE — Progress Notes (Signed)
Patient Care Team: [name], MD as PCP - General (Family Medicine) [name], MD as PCP - Cardiology (Cardiology) [name], MD as Consulting Physician (Obstetrics and Gynecology) [name], [place] as Consulting Physician (Podiatry) [name], MD as Consulting Physician (Rheumatology) [name], MD as Attending Physician (Orthopedic Surgery) [name], MD as Consulting Physician (Cardiology)  DIAGNOSIS:    ICD-10-CM   1. Malignant neoplasm of lower-inner quadrant of left breast in female, estrogen receptor positive ([place])  C50.312    Z17.0     SUMMARY OF ONCOLOGIC HISTORY: Oncology History  Malignant neoplasm of lower-inner quadrant of left breast in female, estrogen receptor positive ([place])  07/26/2018 Initial Diagnosis   Screening mammogram detected a 10cm span of calcifications in the left breast, biopsy confirmed intermediate grade DCIS, ER 100%, PR 100%.    07/26/2018 Cancer Staging   Staging form: Breast, AJCC 8th Edition - Clinical: Stage 0 (cTis (DCIS), cN0, cM0, ER+, PR+) - Signed by Gardenia Phlegm, NP on 07/26/2018     CHIEF COMPLIANT: Follow-up of left breast DCIS to reinitiate antiestrogen therapy  INTERVAL HISTORY: Lorraine [name] is a 68 y.o. with above-mentioned history of left breast DCIS currently on the COMET clinical trial, randomized to the active surveillance arm. She was started on tamoxifen but discontinued it in 10/2018 due to side effects. Mammogram on 02/16/19 showed stable appearance of the known calcifications in the lower left breast. She presents to the clinic today for follow-up to discuss reinitiation of anti-estrogen therapy.   REVIEW OF SYSTEMS:   Constitutional: Denies fevers, chills or abnormal weight loss Eyes: Denies blurriness of vision Ears, nose, mouth, throat, and face: Denies mucositis or sore throat Respiratory: Denies cough, dyspnea or wheezes Cardiovascular: Denies  palpitation, chest discomfort Gastrointestinal: Denies nausea, heartburn or change in bowel habits Skin: Denies abnormal skin rashes Lymphatics: Denies new lymphadenopathy or easy bruising Neurological: Denies numbness, tingling or new weaknesses Behavioral/Psych: Mood is stable, no new changes  Extremities: No lower extremity edema Breast: denies any pain or lumps or nodules in either breasts All other systems were reviewed with the patient and are negative.  I have reviewed the past medical history, past surgical history, social history and family history with the patient and they are unchanged from previous note.  ALLERGIES:  is allergic to amitriptyline; codeine; and nortriptyline.  MEDICATIONS:  Current Outpatient Medications  Medication Sig Dispense Refill  . ALPRAZolam (XANAX) 0.5 MG tablet Take 1 tablet (0.5 mg total) by mouth 2 (two) times daily as needed for anxiety. 60 tablet 1  . amoxicillin (AMOXIL) 875 MG tablet Take 1 tablet (875 mg total) by mouth 2 (two) times daily. 20 tablet 0  . ezetimibe (ZETIA) 10 MG tablet Take 1 tablet (10 mg total) by mouth daily. 90 tablet 3  . meloxicam (MOBIC) 15 MG tablet TAKE 1 TABLET BY MOUTH EVERY DAY 30 tablet 1  . metoprolol tartrate (LOPRESSOR) 25 MG tablet Take 1/2-1 tablet by mouth every 6-8hours for breakthrough afib for heart rates over 100 (Patient not taking: Reported on 12/13/2018) 90 tablet 3  . oxybutynin (DITROPAN-XL) 5 MG 24 hr tablet Take 5 mg by mouth at bedtime.    . polyvinyl alcohol (LIQUIFILM TEARS) 1.4 % ophthalmic solution Place 1 drop into both eyes as needed for dry eyes.    . rivaroxaban (XARELTO) 20 MG TABS tablet Xarelto 20 mg tablet  TAKE 1 TABLET (20 MG TOTAL) BY MOUTH DAILY WITH  SUPPER.    . rosuvastatin (CRESTOR) 10 MG tablet Take 1 tablet (10 mg total) by mouth daily. 30 tablet 3  . tamoxifen (NOLVADEX) 20 MG tablet Take 1 tablet (20 mg total) by mouth daily. 90 tablet 0  . valACYclovir (VALTREX) 1000 MG  tablet TAKE 1 TABLET BY MOUTH EVERY DAY 30 tablet 5  . zolpidem (AMBIEN) 10 MG tablet Take 1 tablet (10 mg total) by mouth at bedtime. 30 tablet 3   No current facility-administered medications for this visit.     PHYSICAL EXAMINATION: ECOG PERFORMANCE STATUS: 1 - Symptomatic but completely ambulatory  Vitals:   02/21/19 0920  BP: (!) 144/79  Pulse: (!) 57  Resp: 17  Temp: 98.7 F (37.1 C)  SpO2: 100%   Filed Weights   02/21/19 0920  Weight: 187 lb 12.8 oz (85.2 kg)    GENERAL: alert, no distress and comfortable SKIN: skin color, texture, turgor are normal, no rashes or significant lesions EYES: normal, Conjunctiva are pink and non-injected, sclera clear OROPHARYNX: no exudate, no erythema and lips, buccal mucosa, and tongue normal  NECK: supple, thyroid normal size, non-tender, without nodularity LYMPH: no palpable lymphadenopathy in the cervical, axillary or inguinal LUNGS: clear to auscultation and percussion with normal breathing effort HEART: regular rate & rhythm and no murmurs and no lower extremity edema ABDOMEN: abdomen soft, non-tender and normal bowel sounds MUSCULOSKELETAL: no cyanosis of digits and no clubbing  NEURO: alert & oriented x 3 with fluent speech, no focal motor/sensory deficits EXTREMITIES: No lower extremity edema  LABORATORY DATA:  I have reviewed the data as listed CMP Latest Ref Rng & Units 11/15/2018 05/18/2018 04/14/2018  Glucose 70 - 99 mg/dL 91 95 119(H)  BUN 6 - 23 mg/dL 20 23 19   Creatinine 0.40 - 1.20 mg/dL 0.71 0.76 0.63  Sodium 135 - 145 mEq/L 140 140 138  Potassium 3.5 - 5.1 mEq/L 4.7 4.3 4.2  Chloride 96 - 112 mEq/L 106 104 108  CO2 19 - 32 mEq/L 26 28 22   Calcium 8.4 - 10.5 mg/dL 9.5 10.1 9.1  Total Protein 6.0 - 8.3 g/dL 6.8 7.6 7.2  Total Bilirubin 0.2 - 1.2 mg/dL 0.6 0.5 0.6  Alkaline Phos 39 - 117 U/L 80 133(H) 94  AST 0 - 37 U/L 14 16 21   ALT 0 - 35 U/L 16 19 20     Lab Results  Component Value Date   WBC 6.2 11/15/2018    HGB 14.4 11/15/2018   HCT 43.4 11/15/2018   MCV 87.0 11/15/2018   PLT 249.0 11/15/2018   NEUTROABS 3.2 11/15/2018    ASSESSMENT & PLAN:  Malignant neoplasm of lower-inner quadrant of left breast in female, estrogen receptor positive ([place]) 07/17/2018:Screening mammogram detected a 10cm span of calcifications in the left breast, biopsy confirmed intermediate grade DCIS, ER 100%, PR 100% Stage 0  Current treatment:  1. COMET clinical trial randomized to active surveillance arm 2.  Tamoxifen 20 mg daily started 09/20/2018  Tamoxifen toxicities: Hot flashes Stopped 11/01/18 due to vaginal infection and vaginal dryness, improved initially and repeat infection caused her to stop. Still having Itching MonaLisa treatment 11/16/18  Counseling: We discussed tamoxifen versus anastrozole therapy. Our plan is to start her at 5 mg of tamoxifen on March 30, 2019.  If she tolerates it well then she will continue.  If she gets frequent UTIs then we will consider switching her to anastrozole at 0.5 mg daily  Breast cancer surveillance: Left breast mammogram 02/16/2019.  Stable appearing  fine pleomorphic calcifications throughout the lower central and lower outer quadrants left breast spanning 9.2 cm.  I will touch base with her in 3 months for a MyChart virtual video visit to assess tolerability to tamoxifen 5 mg. Return to clinic in 6 months with mammogram and follow-up     No orders of the defined types were placed in this encounter.  The patient has a good understanding of the overall plan. she agrees with it. she will call with any problems that may develop before the next visit here.  Nicholas Lose, MD 02/21/2019  Julious Oka Dorshimer, am acting as scribe for Dr. Nicholas Lose.  I have reviewed the above documentation for accuracy and completeness, and I agree with the above.

## 2019-02-21 ENCOUNTER — Encounter: Payer: Self-pay | Admitting: *Deleted

## 2019-02-21 ENCOUNTER — Other Ambulatory Visit: Payer: Self-pay

## 2019-02-21 ENCOUNTER — Inpatient Hospital Stay: Payer: Medicare Other | Attending: Hematology and Oncology | Admitting: Hematology and Oncology

## 2019-02-21 DIAGNOSIS — Z79810 Long term (current) use of selective estrogen receptor modulators (SERMs): Secondary | ICD-10-CM | POA: Diagnosis not present

## 2019-02-21 DIAGNOSIS — C50312 Malignant neoplasm of lower-inner quadrant of left female breast: Secondary | ICD-10-CM

## 2019-02-21 DIAGNOSIS — Z170 Estrogen receptor positive status [ER+]: Secondary | ICD-10-CM | POA: Diagnosis not present

## 2019-02-21 DIAGNOSIS — Z006 Encounter for examination for normal comparison and control in clinical research program: Secondary | ICD-10-CM

## 2019-02-21 MED ORDER — TAMOXIFEN CITRATE 20 MG PO TABS: 5.0000 mg | ORAL_TABLET | Freq: Every day | ORAL | 0 refills | Status: DC

## 2019-02-21 NOTE — Research (Signed)
02/21/19 at 10:43am - COMET month 6 study notes/re-consent note (Comet protocol version 6.0)  -  COMET: Month 6 Follow-up Patient here alone for her on study appointment with Dr. Lindi Adie. Research nurse met with the patient this morning for her 6 month visit.  Patient stopped her daily tamoxifen on 11/01/18.  Dr. Lindi Adie asked the pt to re-start tamoxifen at 5 mg dose in January 2021.   Re-Consent: Consent form Version 27 Oct 2018 was previously emailed to patient by her research nurse, Doristine Johns. The research nurse confirmed with the patient that she had received and reviewed the study consent and the changes involved. Patient confirms that she understands the changes and denies having any questions. Patient then proceeded to sign and date where indicated. Patient also does wish to participate in the optional portion of this study. Patient was provided with her copy of the signed document today for her records.  H&P: Dr. Lindi Adie reviewed with patient all her questions/concerns today. Please see MD's notes from today.   Mammogram: Dr. Lindi Adie reviewed the pt's recent mammogram from 02/16/19.  Next mammogram scheduled for June 15th, 2021  Questionnaires: Patient was thanked for completion of the required questionnaires at this timepoint.   Study Solicited Adverse Events:  Dr. Lindi Adie reviewed the COMET list of solicited Adverse Events with the pt.  The pt is not currently taking tamoxifen.  Please see chart below.  Plan: Patient will be scheduled to return for next study visit, month 12 in June of 2021. Patient was thanked today for her time and continued support of study and was encouraged to call Dr. Lindi Adie or myselft with any questions or concerns she may have.   Brion Aliment RN, BSN, CCRP Clinical Research Nurse 02/21/2019 10:51 AM   Adverse Event Log Study/Protocol:COMET Cycle:6 Month Follow up- Research nurse met with Dr. Lindi Adie on 02/22/19 to obtain attributions.  Event Grade  Attribution Comments  Hypertension  1 Unrelated Pt monitors BP's at home  Arthralgia 2 Unrelated Baseline comorbidity of arthritis- pt takes Mobic  Myalgia  1 Possible pt reported mild leg muscle soreness  High cholesterol   1  Unrelated Baseline comorbidity, on Zetia  Hot flashes  2 Probable  MD reported as tamoxifen toxicity on 11/16/18  Osteopenia  1 Unrelated Per pt report.  Vaginal infection 2 Definite Stopped tamoxifen due to this AE  Vaginal dryness 1 Definite Stopped tamoxifen due to this AE  Sinusitis  2 Unrelated Diagnosed on 12/13/18, prescribed antibiotics  No other "solicited adverse events" present.  Grade 0 AE's:  Fever, nausea, fracture, and allergic reaction.   Not evaluated Solicited AE's include:  Acute Coronary Syndrome and Ischemia Cerebrovascular  Brion Aliment RN, BSN Clinical Research Nurse 02/22/2019 9:21 AM

## 2019-02-21 NOTE — Assessment & Plan Note (Signed)
07/17/2018:Screening mammogram detected a 10cm span of calcifications in the left breast, biopsy confirmed intermediate grade DCIS, ER 100%, PR 100% Stage 0  Current treatment:  1. COMET clinical trial randomized to active surveillance arm 2.  Tamoxifen 20 mg daily started 09/20/2018  Tamoxifen toxicities: Hot flashes Stopped 11/01/18 due to vaginal infection and vaginal dryness, improved initially and repeat infection caused her to stop. Still having Itching MonaLisa treatment 11/16/18  Counseling: We discussed tamoxifen versus anastrozole therapy.  We will start her on anastrozole to see if she tolerates this any better.  Breast cancer surveillance: Left breast mammogram 02/16/2019.  Stable appearing fine pleomorphic calcifications throughout the lower central and lower outer quadrants left breast spanning 9.2 cm. Return to clinic in 6 months with mammogram and follow-up

## 2019-02-22 ENCOUNTER — Telehealth: Payer: Self-pay | Admitting: Hematology and Oncology

## 2019-02-22 NOTE — Telephone Encounter (Signed)
I left a message regarding schedule  

## 2019-03-08 ENCOUNTER — Other Ambulatory Visit: Payer: Self-pay

## 2019-03-08 ENCOUNTER — Ambulatory Visit (HOSPITAL_COMMUNITY)
Admission: RE | Admit: 2019-03-08 | Discharge: 2019-03-08 | Disposition: A | Discharge status: Home / Self Care | Payer: Medicare Other | Source: Ambulatory Visit | Attending: Physician Assistant | Admitting: Physician Assistant

## 2019-03-08 ENCOUNTER — Telehealth: Payer: Self-pay | Admitting: Pharmacist

## 2019-03-08 VITALS — BP 141/74 | HR 50

## 2019-03-08 DIAGNOSIS — Z7901 Long term (current) use of anticoagulants: Secondary | ICD-10-CM

## 2019-03-08 DIAGNOSIS — I480 Paroxysmal atrial fibrillation: Secondary | ICD-10-CM

## 2019-03-08 DIAGNOSIS — D6869 Other thrombophilia: Secondary | ICD-10-CM | POA: Insufficient documentation

## 2019-03-08 NOTE — Telephone Encounter (Signed)
-----   Message from Juluis Mire, RN sent at 03/08/2019 10:06 AM EST ----- Regarding: new coumadin Pt would like to transition to coumadin from xarelto when her current rx runs out. Can you help coordinate this? She hasn't tolerated xarelto or eliquis per pt. Thanks [employer]

## 2019-03-08 NOTE — Progress Notes (Signed)
Electrophysiology TeleHealth Note   Due to national recommendations of social distancing due to [place] 19, Audio telehealth visit is felt to be most appropriate for this patient at this time.  See consent below from today for patient consent regarding telehealth for the Atrial Fibrillation Clinic. Consent obtained verbally.   Date:  03/08/2019   ID:  Lorraine [name], DOB [birth year], MRN [id number]  Location: home  Provider location: [address] [place], [place] [address] Evaluation Performed: Follow up  PCP:  Lorraine Minium, MD  Primary Cardiologist:  Dr Lorraine [name]   CC: Follow up for atrial fibrillation   History of Present Illness: Lorraine [name] is a 68 y.o. female who presents via audio conferencing for a telehealth visit today. The patient was initially diagnosed with atrial fibrillation 04/14/18 after having a cystoscopy she developed atrial fibrillation with RVR HR 130s in PACU. She was asymptomatic during this episode. She was given 15 mg diltiazem and converted to NSR. She was then started on Eliquis and PRN metoprolol. Cardiac monitoring showed 4% AF burden with longest episode lasting 2.5 hours. She was unable to tolerate Eliquis 2/2 side effects and is now on Xarelto for a CHADS2VASC score of 3. Of note, she is currently undergoing treatment for breast cancer.  On follow up today, patient reports she has done well from a cardiac standpoint. She had only one episode of afib last week which responded quickly to her PRN BB. Patient does admit she stopped her Xarelto on her own due to concerns it could be causing UTIs.  Today, she denies symptoms palpitations, chest pain, shortness of breath, orthopnea, PND, lower extremity edema, claudication, dizziness, presyncope, syncope, bleeding, or neurologic sequela. The patient is tolerating medications without difficulties and is otherwise without complaint today.       Atrial Fibrillation Risk Factors:  she does not have symptoms  or diagnosis of sleep apnea. she does not have a history of rheumatic fever. she does not have a history of alcohol use.   she has a BMI of There is no height or weight on file to calculate BMI..  BP 141/74 Pulse 50 Provided by pt with home BP machine.   Atrial Fibrillation Management history:  Previous antiarrhythmic drugs: none Previous cardioversions: none Previous ablations: none CHADS2VASC score: 3 (female, age, aortic plaque) Anticoagulation history: Eliquis, Xarelto  Past Medical History:  Diagnosis Date  . Anxiety   . Aortic atherosclerosis ([place]) 02/03/2018   Noted on CT Abd/Pelvis  . AR (aortic regurgitation) 07/18/2017   Mild, noted on ECHO  . Bilateral cataracts   . Bilateral plantar fasciitis   . Chronic knee pain   . Chronic low back pain   . Chronic lumbar radiculopathy 07/14/2017   Mild, L5, noted on electromyography  . History of palpitations    2014-- low risk exercise tolerence test, no ischemi, PVC's (09-06-2012)  . HSV-1 (herpes simplex virus 1) infection    Cold sore  . Hx of colonic polyps   . Hyperlipidemia   . Insomnia   . Lactose intolerance   . Left nephrolithiasis 02/03/2018   Nonobstructing, noted on CT Abd/pelvis  . Morton's metatarsalgia, neuralgia, or neuroma, bilateral   . Osteoarthritis    knees and lumbar, feet  . Osteopenia   . Peripheral neuropathy    bilateral feet -- burning and stinging  . PONV (postoperative nausea and vomiting)    severe  . PVC's (premature ventricular contractions)   . Systolic murmur    "  Slight"  . Vaginal cyst   . Wears glasses    Past Surgical History:  Procedure Laterality Date  . ANTERIOR CERVICAL DECOMP/DISCECTOMY FUSION  01/2014   C4 -- C6  . CATARACT EXTRACTION W/ INTRAOCULAR LENS  IMPLANT, BILATERAL  2015  . COLONOSCOPY  05/14/2015  . CYSTO WITH HYDRODISTENSION N/A 04/14/2018   Procedure: CYSTOSCOPY/HYDRODISTENSION WITH INSTILLATION OF PYRIDIUM AND MARCAINE, BLADDER BIOPSY WITH  FULGERATION 0.5 TO 2 CM;  Surgeon: Lorraine Aloe, MD;  Location: [facility];  Service: Urology;  Laterality: N/A;  . ELBOW DEBRIDEMENT Right 09/10/2010   and tendon debridement and radial tunnel release  . EXCISION MORTON'S NEUROMA Bilateral left 02-20-2009/  right & left 05-08-2009  . EXCISION VAGINAL CYST N/A 07/01/2016   Procedure: EXCISION VAGINAL CYST;  Surgeon: Lorraine Dense, MD;  Location: [facility];  Service: Gynecology;  Laterality: N/A;  . PLANTAR FASCIA RELEASE Bilateral left 04-02-2010/  right 04-02-2016  . TONSILLECTOMY AND ADENOIDECTOMY  child  . TOTAL ABDOMINAL HYSTERECTOMY  1974   w/  Left salpingoophorectomy and Partial right salpingoophorectomy     Current Outpatient Medications  Medication Sig Dispense Refill  . ALPRAZolam (XANAX) 0.5 MG tablet Take 1 tablet (0.5 mg total) by mouth 2 (two) times daily as needed for anxiety. 60 tablet 1  . ezetimibe (ZETIA) 10 MG tablet Take 1 tablet (10 mg total) by mouth daily. 90 tablet 3  . meloxicam (MOBIC) 15 MG tablet TAKE 1 TABLET BY MOUTH EVERY DAY 30 tablet 1  . metoprolol tartrate (LOPRESSOR) 25 MG tablet Take 1/2-1 tablet by mouth every 6-8hours for breakthrough afib for heart rates over 100 (Patient not taking: Reported on 12/13/2018) 90 tablet 3  . oxybutynin (DITROPAN-XL) 5 MG 24 hr tablet Take 5 mg by mouth at bedtime.    . polyvinyl alcohol (LIQUIFILM TEARS) 1.4 % ophthalmic solution Place 1 drop into both eyes as needed for dry eyes.    . tamoxifen (NOLVADEX) 20 MG tablet Take 0.5 tablets (10 mg total) by mouth daily. 90 tablet 0  . valACYclovir (VALTREX) 1000 MG tablet TAKE 1 TABLET BY MOUTH EVERY DAY 30 tablet 5  . zolpidem (AMBIEN) 10 MG tablet Take 1 tablet (10 mg total) by mouth at bedtime. 30 tablet 3   No current facility-administered medications for this encounter.    Allergies:   Amitriptyline, Codeine, and Nortriptyline   Social History:  The patient  reports that  she has never smoked. She has never used smokeless tobacco. She reports current alcohol use. She reports that she does not use drugs.   Family History:  The patient's  family history includes Cancer in her paternal grandfather; Heart attack in her father; Lung cancer in her mother; Memory loss in her father; Multiple sclerosis in her brother.    ROS:  Please see the history of present illness.   All other systems are personally reviewed and negative.   Recent Labs: 11/15/2018: ALT 16; BUN 20; Creatinine, Ser 0.71; Hemoglobin 14.4; Platelets 249.0; Potassium 4.7; Sodium 140; TSH 1.41  personally reviewed    Other studies personally reviewed: Additional studies/ records that were reviewed today include: Epic notes, heart monitor  Echo 07/2017 - Left ventricle: The cavity size was normal. Wall thickness was   normal. Systolic function was normal. The estimated ejection   fraction was in the range of 55% to 60%. Doppler parameters are   consistent with both elevated ventricular end-diastolic filling   pressure and elevated left atrial filling  pressure. - Aortic valve: There was mild regurgitation. - Atrial septum: No defect or patent foramen ovale was identified  Long term monitor 06/05/18 4% AF burden, longest episode 2.5 hrs    ASSESSMENT AND PLAN:  1.  Paroxysmal atrial fibrillation Patient appears to be maintaining SR clinically.  Continue Lopressor 12.5 mg PRN q 6hrs for heart racing. We again had a long discussion about her stroke risk and the risks and benefits of anticoagulation. Patient voices understanding that stopping anticoagulation increasing her risk of stroke. Will resume Xarelto 20 mg daily for now and refer to the Coumadin Clinic as patient admits cost is an issue.  This patients CHA2DS2-VASc Score and unadjusted Ischemic Stroke Rate (% per year) is equal to 3.2 % stroke rate/year from a score of 3  Above score calculated as 1 point each if present [CHF, HTN, DM,  Vascular=MI/PAD/Aortic Plaque, Age if 65-74, or Female] Above score calculated as 2 points each if present [Age > 75, or Stroke/TIA/TE]  2. DCIS Plans per oncology. Patient in COMET trial.    Follow-up in the AF clinic in 4 months.    Current medicines are reviewed at length with the patient today.   The patient does not have concerns regarding her medicines.  The following changes were made today:  Resume Xarelto  Labs/ tests ordered today include: none No orders of the defined types were placed in this encounter.   Patient Risk:  after full review of this patients clinical status, I feel that they are at moderate risk at this time.   Today, I have spent 23 minutes with the patient with telehealth technology discussing the above.   Gwenlyn Perking PA-C 03/08/2019 9:58 AM  Afib [facility] [address] [place], [place] [address] [phone]   I hereby voluntarily request, consent and authorize the [facility] and its employed or contracted physicians, physician assistants, nurse practitioners or other licensed health care professionals (the Practitioner), to provide me with telemedicine health care services (the "Services") as deemed necessary by the treating Practitioner. I acknowledge and consent to receive the Services by the Practitioner via telemedicine. I understand that the telemedicine visit will involve communicating with the Practitioner through live audiovisual communication technology and the disclosure of certain medical information by electronic transmission. I acknowledge that I have been given the opportunity to request an in-person assessment or other available alternative prior to the telemedicine visit and am voluntarily participating in the telemedicine visit.   I understand that I have the right to withhold or withdraw my consent to the use of telemedicine in the course of my care at any time, without affecting my right  to future care or treatment, and that the Practitioner or I may terminate the telemedicine visit at any time. I understand that I have the right to inspect all information obtained and/or recorded in the course of the telemedicine visit and may receive copies of available information for a reasonable fee.  I understand that some of the potential risks of receiving the Services via telemedicine include:   Delay or interruption in medical evaluation due to technological equipment failure or disruption;  Information transmitted may not be sufficient (e.g. poor resolution of images) to allow for appropriate medical decision making by the Practitioner; and/or  In rare instances, security protocols could fail, causing a breach of personal health information.   Furthermore, I acknowledge that it is my responsibility to provide information about my medical history, conditions and care  that is complete and accurate to the best of my ability. I acknowledge that Practitioner's advice, recommendations, and/or decision may be based on factors not within their control, such as incomplete or inaccurate data provided by me or distortions of diagnostic images or specimens that may result from electronic transmissions. I understand that the practice of medicine is not an exact science and that Practitioner makes no warranties or guarantees regarding treatment outcomes. I acknowledge that I will receive a copy of this consent concurrently upon execution via email to the email address I last provided but may also request a printed copy by calling the office of the [facility].  I understand that my insurance will be billed for this visit.   I have read or had this consent read to me.  I understand the contents of this consent, which adequately explains the benefits and risks of the Services being provided via telemedicine.  I have been provided ample opportunity to ask questions regarding this consent and the  Services and have had my questions answered to my satisfaction.  I give my informed consent for the services to be provided through the use of telemedicine in my medical care  By participating in this telemedicine visit I agree to the above.

## 2019-03-08 NOTE — Telephone Encounter (Signed)
Left message for pt to discuss transition from Xarelto to warfarin.

## 2019-03-08 NOTE — Telephone Encounter (Signed)
Pt returned call to clinic. She states she still has 2.5 months left of Xarelto. She states it has become too expensive to continue. Discussed differences in Coumadin vs Xarelto she is currently taking, including slower onset of action and need for consistency of Vitamin K in diet.  Advised pt to call clinic when she has 1 week left of Xarelto so that we can transition her to warfarin. Provided her with direct # to Coumadin clinic. She verbalized understanding and states this will be close to March 2021 when she calls.

## 2019-03-12 ENCOUNTER — Encounter: Payer: Self-pay | Admitting: Family Medicine

## 2019-03-12 MED ORDER — MELOXICAM 15 MG PO TABS: 15.0000 mg | ORAL_TABLET | Freq: Every day | ORAL | 1 refills | Status: DC

## 2019-03-12 NOTE — Telephone Encounter (Signed)
Pt is requesting Zolpidem and meloxicam to be refilled. However when I went to add zolpidem to refill request it said that it was no longer preferred. Please advise if ok to prefer with original order?

## 2019-03-12 NOTE — Addendum Note (Signed)
Addended by: Davis Gourd on: 03/12/2019 03:44 PM   Modules accepted: Orders

## 2019-03-13 MED ORDER — ZOLPIDEM TARTRATE 10 MG PO TABS: 10.0000 mg | ORAL_TABLET | Freq: Every day | ORAL | 3 refills | Status: DC

## 2019-04-04 DIAGNOSIS — N952 Postmenopausal atrophic vaginitis: Secondary | ICD-10-CM | POA: Diagnosis not present

## 2019-04-04 DIAGNOSIS — N760 Acute vaginitis: Secondary | ICD-10-CM | POA: Diagnosis not present

## 2019-04-04 DIAGNOSIS — N898 Other specified noninflammatory disorders of vagina: Secondary | ICD-10-CM | POA: Diagnosis not present

## 2019-04-04 DIAGNOSIS — N766 Ulceration of vulva: Secondary | ICD-10-CM | POA: Diagnosis not present

## 2019-04-13 ENCOUNTER — Telehealth: Payer: Self-pay | Admitting: General Practice

## 2019-04-13 NOTE — Telephone Encounter (Signed)
Please advise, I received a refill request for Crestor 10mg  for patient. However, this is not listed on her current med list. Per note on 11/15/18 pt was going to restart statin which was filled on 11/15/18 #30 with 3. Not sure why it was removed form list. Wanted to make sure there wasn't a note from another provider. Before I filled this.

## 2019-04-16 MED ORDER — ROSUVASTATIN CALCIUM 10 MG PO TABS: 10.0000 mg | ORAL_TABLET | Freq: Every day | ORAL | 1 refills | Status: DC

## 2019-04-16 NOTE — Telephone Encounter (Signed)
Ok to refill.  I see no documentation as to why it was removed.

## 2019-04-16 NOTE — Telephone Encounter (Signed)
Medication filled to pharmacy as requested.   

## 2019-04-16 NOTE — Addendum Note (Signed)
Addended by: Davis Gourd on: 04/16/2019 08:27 AM   Modules accepted: Orders

## 2019-05-17 ENCOUNTER — Other Ambulatory Visit (HOSPITAL_COMMUNITY): Payer: Self-pay | Admitting: *Deleted

## 2019-05-17 MED ORDER — RIVAROXABAN 20 MG PO TABS: 20.0000 mg | ORAL_TABLET | Freq: Every day | ORAL | 6 refills | Status: DC

## 2019-05-21 ENCOUNTER — Encounter: Payer: Self-pay | Admitting: Family Medicine

## 2019-05-21 ENCOUNTER — Encounter: Payer: Self-pay | Admitting: General Practice

## 2019-05-21 ENCOUNTER — Ambulatory Visit (INDEPENDENT_AMBULATORY_CARE_PROVIDER_SITE_OTHER): Payer: Medicare Other | Admitting: Family Medicine

## 2019-05-21 ENCOUNTER — Other Ambulatory Visit: Payer: Self-pay

## 2019-05-21 VITALS — BP 130/78 | HR 78 | Temp 97.9°F | Resp 16 | Ht 65.0 in | Wt 179.0 lb

## 2019-05-21 DIAGNOSIS — E785 Hyperlipidemia, unspecified: Secondary | ICD-10-CM | POA: Diagnosis not present

## 2019-05-21 DIAGNOSIS — I480 Paroxysmal atrial fibrillation: Secondary | ICD-10-CM

## 2019-05-21 DIAGNOSIS — Z0000 Encounter for general adult medical examination without abnormal findings: Secondary | ICD-10-CM

## 2019-05-21 DIAGNOSIS — E663 Overweight: Secondary | ICD-10-CM | POA: Diagnosis not present

## 2019-05-21 DIAGNOSIS — C50312 Malignant neoplasm of lower-inner quadrant of left female breast: Secondary | ICD-10-CM

## 2019-05-21 DIAGNOSIS — Z170 Estrogen receptor positive status [ER+]: Secondary | ICD-10-CM

## 2019-05-21 LAB — CBC WITH DIFFERENTIAL/PLATELET
Basophils Absolute: 0 10*3/uL (ref 0.0–0.1)
Basophils Relative: 0.3 % (ref 0.0–3.0)
Eosinophils Absolute: 0.2 10*3/uL (ref 0.0–0.7)
Eosinophils Relative: 3 % (ref 0.0–5.0)
HCT: 44.3 % (ref 36.0–46.0)
Hemoglobin: 14.6 g/dL (ref 12.0–15.0)
Lymphocytes Relative: 31.8 % (ref 12.0–46.0)
Lymphs Abs: 1.9 10*3/uL (ref 0.7–4.0)
MCHC: 33 g/dL (ref 30.0–36.0)
MCV: 86.4 fl (ref 78.0–100.0)
Monocytes Absolute: 0.3 10*3/uL (ref 0.1–1.0)
Monocytes Relative: 5.5 % (ref 3.0–12.0)
Neutro Abs: 3.6 10*3/uL (ref 1.4–7.7)
Neutrophils Relative %: 59.4 % (ref 43.0–77.0)
Platelets: 242 10*3/uL (ref 150.0–400.0)
RBC: 5.13 Mil/uL — ABNORMAL HIGH (ref 3.87–5.11)
RDW: 15.8 % — ABNORMAL HIGH (ref 11.5–15.5)
WBC: 6 10*3/uL (ref 4.0–10.5)

## 2019-05-21 LAB — BASIC METABOLIC PANEL
BUN: 30 mg/dL — ABNORMAL HIGH (ref 6–23)
CO2: 25 mEq/L (ref 19–32)
Calcium: 9.7 mg/dL (ref 8.4–10.5)
Chloride: 107 mEq/L (ref 96–112)
Creatinine, Ser: 0.67 mg/dL (ref 0.40–1.20)
GFR: 87.3 mL/min (ref >60.00)
Glucose, Bld: 95 mg/dL (ref 70–99)
Potassium: 4.1 mEq/L (ref 3.5–5.1)
Sodium: 140 mEq/L (ref 135–145)

## 2019-05-21 LAB — HEPATIC FUNCTION PANEL
ALT: 16 U/L (ref 0–35)
AST: 14 U/L (ref 0–37)
Albumin: 4.3 g/dL (ref 3.5–5.2)
Alkaline Phosphatase: 83 U/L (ref 39–117)
Bilirubin, Direct: 0.2 mg/dL (ref 0.0–0.3)
Total Bilirubin: 0.9 mg/dL (ref 0.2–1.2)
Total Protein: 6.8 g/dL (ref 6.0–8.3)

## 2019-05-21 LAB — LIPID PANEL
Cholesterol: 170 mg/dL (ref 0–200)
HDL: 43.1 mg/dL (ref >39.00)
LDL Cholesterol: 115 mg/dL — ABNORMAL HIGH (ref 0–99)
NonHDL: 126.46
Total CHOL/HDL Ratio: 4
Triglycerides: 55 mg/dL (ref 0.0–149.0)
VLDL: 11 mg/dL (ref 0.0–40.0)

## 2019-05-21 LAB — TSH: TSH: 1.58 u[IU]/mL (ref 0.35–4.50)

## 2019-05-21 MED ORDER — MELOXICAM 15 MG PO TABS: 15.0000 mg | ORAL_TABLET | Freq: Every day | ORAL | 1 refills | Status: DC

## 2019-05-21 NOTE — Progress Notes (Addendum)
Subjective:    Patient ID: Lorraine [name], female    DOB: Sep 19, 1950, 69 y.o.   MRN: [id number]  HPI Here today for MWV.  Risk Factors: Hyperlipidemia- chronic problem, on Crestor 10mg  daily no CP, SOB, abd pain, N/V.  Exercising regularly Paroxysmal Afib- ongoing issue for pt.  Is able to feel when she is having an episode and will take Metoprolol prn.  On Xarelto daily.  Also on Meloxicam for arthritis (cards is aware).  No bleeding Breast Cancer- ongoing issue, following w/ Dr Lindi Adie.  On Tamoxifen. Overweight- pt is down 9 lbs since last visit.  Pt has been working 'really hard'.  Pt reports feeling good Physical Activity: walking 30-60 minutes/day, 4x/week Fall Risk: low Depression: denies current sxs.  Takes Alprazolam as needed for high anxiety moments Hearing: normal to conversational tones ADL's: independent Cognitive: normal linear thought process, memory and attention intact Home Safety: [name] at home Height, Weight, BMI, Visual Acuity: see vitals, vision corrected to 20/20 w/ glasses Counseling: UTD on mammo, colonoscopy, immunizations Labs Ordered: See A&P Care Plan: See A&P   Health Maintenance  Topic Date Due  . Hepatitis C Screening  05/20/2020 (Originally 03-14-1951)  . MAMMOGRAM  07/10/2020  . COLONOSCOPY  05/13/2025  . TETANUS/TDAP  07/12/2025  . INFLUENZA VACCINE  Completed  . DEXA SCAN  Completed  . PNA vac Low Risk Adult  Completed    Patient Care Team    Relationship Specialty Notifications Start End  [name], MD PCP - General Family Medicine  05/26/16   [name], MD PCP - Cardiology Cardiology Admissions 07/27/18   Tyson Dense, MD Consulting Physician Obstetrics and Gynecology  09/23/16   [name], [place] Consulting Physician Podiatry  09/23/16   [name], MD Consulting Physician Rheumatology  09/23/16   [name], MD Attending Physician Orthopedic Surgery  09/23/16   [name], MD Consulting Physician  Cardiology  07/14/17       Review of Systems For ROS see HPI   This visit occurred during the SARS-CoV-2 public health emergency.  Safety protocols were in place, including screening questions prior to the visit, additional usage of staff PPE, and extensive cleaning of exam room while observing appropriate contact time as indicated for disinfecting solutions.       Objective:   Physical Exam General Appearance:    Alert, cooperative, no distress, appears stated age  Head:    Normocephalic, without obvious abnormality, atraumatic  Eyes:    PERRL, conjunctiva/corneas clear, EOM's intact, fundi    benign, both eyes  Ears:    Normal TM's and external ear canals, both ears  Nose:   Deferred due to COVID  Throat:   Neck:   Supple, symmetrical, trachea midline, no adenopathy;    Thyroid: no enlargement/tenderness/nodules  Back:     Symmetric, no curvature, ROM normal, no CVA tenderness  Lungs:     Clear to auscultation bilaterally, respirations unlabored  Chest Wall:    No tenderness or deformity   Heart:    Regular rate and rhythm, S1 and S2 normal, II/VI SEM murmur, no rub or gallop  Breast Exam:    Deferred to GYN  Abdomen:     Soft, non-tender, bowel sounds active all four quadrants,    no masses, no organomegaly  Genitalia:    Deferred to GYN  Rectal:    Extremities:   Extremities normal, atraumatic, no cyanosis or edema  Pulses:   2+  and symmetric all extremities  Skin:   Skin color, texture, turgor normal, no rashes or lesions  Lymph nodes:   Cervical, supraclavicular, and axillary nodes normal  Neurologic:   CNII-XII intact, normal strength, sensation and reflexes    throughout          Assessment & Plan:

## 2019-05-21 NOTE — Assessment & Plan Note (Signed)
Pt is down 9 lbs since last visit.  Applauded her efforts.  Check labs to risk stratify.  Will follow. 

## 2019-05-21 NOTE — Assessment & Plan Note (Signed)
Chronic problem.  Following w/ Cards.  On daily Xarelto and Metoprolol prn.  Again discussed increased risk of bleeding w/ Xarelto/Meloxicam combo.  Pt is aware of risk but states this is the only way to remain active w/ her arthritis

## 2019-05-21 NOTE — Assessment & Plan Note (Signed)
Ongoing issue for pt, following w/ [place], on Tamoxifen.  Will follow along.

## 2019-05-21 NOTE — Assessment & Plan Note (Signed)
Chronic problem, tolerating statin w/o difficulty.  Check labs.  Adjust meds prn  

## 2019-05-21 NOTE — Patient Instructions (Signed)
Follow up in 6 months to recheck cholesterol We'll notify you of your lab results and make any changes if needed Keep up the good work on healthy diet and regular exercise- you look great! Schedule your vaccine appt ASAP Call with any questions or concerns Stay Safe!  Stay Healthy!   Preventive Care 69 Years and Older, Female Preventive care refers to lifestyle choices and visits with your health care provider that can promote health and wellness. This includes:  A yearly physical exam. This is also called an annual well check.  Regular dental and eye exams.  Immunizations.  Screening for certain conditions.  Healthy lifestyle choices, such as diet and exercise. What can I expect for my preventive care visit? Physical exam Your health care provider will check:  Height and weight. These may be used to calculate body mass index (BMI), which is a measurement that tells if you are at a healthy weight.  Heart rate and blood pressure.  Your skin for abnormal spots. Counseling Your health care provider may ask you questions about:  Alcohol, tobacco, and drug use.  Emotional well-being.  Home and relationship well-being.  Sexual activity.  Eating habits.  History of falls.  Memory and ability to understand (cognition).  Work and work Statistician.  Pregnancy and menstrual history. What immunizations do I need?  Influenza (flu) vaccine  This is recommended every year. Tetanus, diphtheria, and pertussis (Tdap) vaccine  You may need a Td booster every 10 years. Varicella (chickenpox) vaccine  You may need this vaccine if you have not already been vaccinated. Zoster (shingles) vaccine  You may need this after age 69. Pneumococcal conjugate (PCV13) vaccine  One dose is recommended after age 69. Pneumococcal polysaccharide (PPSV23) vaccine  One dose is recommended after age 69. Measles, mumps, and rubella (MMR) vaccine  You may need at least one dose of MMR if  you were born in 1957 or later. You may also need a second dose. Meningococcal conjugate (MenACWY) vaccine  You may need this if you have certain conditions. Hepatitis A vaccine  You may need this if you have certain conditions or if you travel or work in places where you may be exposed to hepatitis A. Hepatitis B vaccine  You may need this if you have certain conditions or if you travel or work in places where you may be exposed to hepatitis B. Haemophilus influenzae type b (Hib) vaccine  You may need this if you have certain conditions. You may receive vaccines as individual doses or as more than one vaccine together in one shot (combination vaccines). Talk with your health care provider about the risks and benefits of combination vaccines. What tests do I need? Blood tests  Lipid and cholesterol levels. These may be checked every 5 years, or more frequently depending on your overall health.  Hepatitis C test.  Hepatitis B test. Screening  Lung cancer screening. You may have this screening every year starting at age 69 if you have a 30-pack-year history of smoking and currently smoke or have quit within the past 15 years.  Colorectal cancer screening. All adults should have this screening starting at age 69 and continuing until age 3. Your health care provider may recommend screening at age 69 if you are at increased risk. You will have tests every 1-10 years, depending on your results and the type of screening test.  Diabetes screening. This is done by checking your blood sugar (glucose) after you have not eaten for a while (  fasting). You may have this done every 1-3 years.  Mammogram. This may be done every 1-2 years. Talk with your health care provider about how often you should have regular mammograms.  BRCA-related cancer screening. This may be done if you have a family history of breast, ovarian, tubal, or peritoneal cancers. Other tests  Sexually transmitted disease (STD)  testing.  Bone density scan. This is done to screen for osteoporosis. You may have this done starting at age 69. Follow these instructions at home: Eating and drinking  Eat a diet that includes fresh fruits and vegetables, whole grains, lean protein, and low-fat dairy products. Limit your intake of foods with high amounts of sugar, saturated fats, and salt.  Take vitamin and mineral supplements as recommended by your health care provider.  Do not drink alcohol if your health care provider tells you not to drink.  If you drink alcohol: ? Limit how much you have to 0-1 drink a day. ? Be aware of how much alcohol is in your drink. In the [place] one drink equals one 12 oz bottle of beer (355 mL), one 5 oz glass of wine (148 mL), or one 1 oz glass of hard liquor (44 mL). Lifestyle  Take daily care of your teeth and gums.  Stay active. Exercise for at least 30 minutes on 5 or more days each week.  Do not use any products that contain nicotine or tobacco, such as cigarettes, e-cigarettes, and chewing tobacco. If you need help quitting, ask your health care provider.  If you are sexually active, practice safe sex. Use a condom or other form of protection in order to prevent STIs (sexually transmitted infections).  Talk with your health care provider about taking a low-dose aspirin or statin. What's next?  Go to your health care provider once a year for a well check visit.  Ask your health care provider how often you should have your eyes and teeth checked.  Stay up to date on all vaccines. This information is not intended to replace advice given to you by your health care provider. Make sure you discuss any questions you have with your health care provider. Document Revised: 02/23/2018 Document Reviewed: 02/23/2018 Elsevier Patient Education  2020 [employer].

## 2019-05-22 DIAGNOSIS — H35363 Drusen (degenerative) of macula, bilateral: Secondary | ICD-10-CM | POA: Diagnosis not present

## 2019-05-22 DIAGNOSIS — M25511 Pain in right shoulder: Secondary | ICD-10-CM | POA: Diagnosis not present

## 2019-05-24 NOTE — Progress Notes (Signed)
HEMATOLOGY-ONCOLOGY MYCHART VIDEO VISIT PROGRESS NOTE  I connected with Lorraine [name] on 05/25/2019 at  8:00 AM EST by MyChart video conference and verified that I am speaking with the correct person using two identifiers.  I discussed the limitations, risks, security and privacy concerns of performing an evaluation and management service by MyChart and the availability of in person appointments.  I also discussed with the patient that there may be a patient responsible charge related to this service. The patient expressed understanding and agreed to proceed.  Patient's Location: Home Physician Location: Clinic  CHIEF COMPLIANT: Follow-up of left breast DCIS on tamoxifen  INTERVAL HISTORY: Lorraine [name] is a 69 y.o. female with above-mentioned history of left breast DCIS currently on the COMET clinical trial, randomized to the active surveillance arm. She is currently on tamoxifen 5mg  daily. She presents over MyChart today for follow-up.  No further problems since she had the [facility] touch and we reduce the dosage of tamoxifen to 5 mg daily.  Oncology History  Malignant neoplasm of lower-inner quadrant of left breast in female, estrogen receptor positive ([place])  07/26/2018 Initial Diagnosis   Screening mammogram detected a 10cm span of calcifications in the left breast, biopsy confirmed intermediate grade DCIS, ER 100%, PR 100%.    07/26/2018 Cancer Staging   Staging form: Breast, AJCC 8th Edition - Clinical: Stage 0 (cTis (DCIS), cN0, cM0, ER+, PR+) - Signed by Gardenia Phlegm, NP on 07/26/2018   03/20/2019 -  Anti-estrogen oral therapy   Tamoxifen, 5mg  daily     Observations/Objective:  There were no vitals filed for this visit. There is no height or weight on file to calculate BMI.  I have reviewed the data as listed CMP Latest Ref Rng & Units 05/21/2019 11/15/2018 05/18/2018  Glucose 70 - 99 mg/dL 95 91 95  BUN 6 - 23 mg/dL 30(H) 20 23  Creatinine 0.40 - 1.20 mg/dL 0.67 0.71  0.76  Sodium 135 - 145 mEq/L 140 140 140  Potassium 3.5 - 5.1 mEq/L 4.1 4.7 4.3  Chloride 96 - 112 mEq/L 107 106 104  CO2 19 - 32 mEq/L 25 26 28   Calcium 8.4 - 10.5 mg/dL 9.7 9.5 10.1  Total Protein 6.0 - 8.3 g/dL 6.8 6.8 7.6  Total Bilirubin 0.2 - 1.2 mg/dL 0.9 0.6 0.5  Alkaline Phos 39 - 117 U/L 83 80 133(H)  AST 0 - 37 U/L 14 14 16   ALT 0 - 35 U/L 16 16 19     Lab Results  Component Value Date   WBC 6.0 05/21/2019   HGB 14.6 05/21/2019   HCT 44.3 05/21/2019   MCV 86.4 05/21/2019   PLT 242.0 05/21/2019   NEUTROABS 3.6 05/21/2019      Assessment Plan:  Malignant neoplasm of lower-inner quadrant of left breast in female, estrogen receptor positive ([place]) 07/17/2018:Screening mammogram detected a 10cm span of calcifications in the left breast, biopsy confirmed intermediate grade DCIS, ER 100%, PR 100% Stage 0  Current treatment: 1. COMETclinical trial randomized to active surveillance arm 2.Tamoxifen 20 mg daily started 09/20/2018 stopped 11/01/2018.  Vaginal dryness ([name] treatment 11/16/2018) restarted tamoxifen 5 mg 03/30/2019  Tamoxifen toxicities: Hot flashes Vaginal dryness and frequent UTIs: fine since we decreased the dose.  Patient is scheduled to receive the [employer] vaccine through her workplace. Since she is doing quite well from the tamoxifen standpoint we will see her back in June at the same dosage.  I discussed the assessment and treatment  plan with the patient. The patient was provided an opportunity to ask questions and all were answered. The patient agreed with the plan and demonstrated an understanding of the instructions. The patient was advised to call back or seek an in-person evaluation if the symptoms worsen or if the condition fails to improve as anticipated.   I provided 20 minutes of face-to-face MyChart video visit time during this encounter.    Rulon Eisenmenger, MD 05/25/2019   I, Cloyde Reams Dorshimer, am acting as scribe for Nicholas Lose,  MD.  I have reviewed the above documentation for accuracy and completeness, and I agree with the above.

## 2019-05-25 ENCOUNTER — Inpatient Hospital Stay: Payer: Medicare Other | Attending: Hematology and Oncology | Admitting: Hematology and Oncology

## 2019-05-25 DIAGNOSIS — Z170 Estrogen receptor positive status [ER+]: Secondary | ICD-10-CM | POA: Insufficient documentation

## 2019-05-25 DIAGNOSIS — C50312 Malignant neoplasm of lower-inner quadrant of left female breast: Secondary | ICD-10-CM | POA: Insufficient documentation

## 2019-05-25 DIAGNOSIS — Z79810 Long term (current) use of selective estrogen receptor modulators (SERMs): Secondary | ICD-10-CM | POA: Insufficient documentation

## 2019-05-25 NOTE — Assessment & Plan Note (Signed)
07/17/2018:Screening mammogram detected a 10cm span of calcifications in the left breast, biopsy confirmed intermediate grade DCIS, ER 100%, PR 100% Stage 0  Current treatment: 1. COMETclinical trial randomized to active surveillance arm 2.Tamoxifen 20 mg daily started 09/20/2018 stopped 11/01/2018.  Vaginal dryness ([name] treatment 11/16/2018) restarted tamoxifen 5 mg 03/30/2019  Tamoxifen toxicities: Hot flashes Vaginal dryness and frequent UTIs  If she cannot tolerate tamoxifen we will have to consider switching her to anastrozole therapy.

## 2019-05-28 DIAGNOSIS — M25511 Pain in right shoulder: Secondary | ICD-10-CM | POA: Diagnosis not present

## 2019-05-29 ENCOUNTER — Encounter: Payer: Self-pay | Admitting: Hematology and Oncology

## 2019-06-01 DIAGNOSIS — M25511 Pain in right shoulder: Secondary | ICD-10-CM | POA: Diagnosis not present

## 2019-06-05 DIAGNOSIS — M25511 Pain in right shoulder: Secondary | ICD-10-CM | POA: Diagnosis not present

## 2019-06-25 ENCOUNTER — Telehealth: Payer: Self-pay | Admitting: Family Medicine

## 2019-06-25 NOTE — Progress Notes (Signed)
°  Chronic Care Management   Outreach Note  06/25/2019 Name: Lorraine [name] MRN: [id number] DOB: 07-13-1950  Referred by: [name], MD Reason for referral : No chief complaint on file.   An unsuccessful telephone outreach was attempted today. The patient was referred to the pharmacist for assistance with care management and care coordination.   Follow Up Plan:   Earney Hamburg Upstream Scheduler

## 2019-06-28 ENCOUNTER — Telehealth: Payer: Self-pay | Admitting: Family Medicine

## 2019-06-28 ENCOUNTER — Telehealth: Payer: Self-pay | Admitting: *Deleted

## 2019-06-28 DIAGNOSIS — M25511 Pain in right shoulder: Secondary | ICD-10-CM | POA: Diagnosis not present

## 2019-06-28 NOTE — Telephone Encounter (Signed)
Made pt aware of this, she states they are sending a clearance to the Afib clinic as well. I have placed the form in the bin upfront.

## 2019-06-28 NOTE — Telephone Encounter (Signed)
She has Afib and is on anticoagulation.  Will need cardiology clearance to proceed w/ surgery (sees Dr Debara Pickett)

## 2019-06-28 NOTE — Telephone Encounter (Signed)
FYI

## 2019-06-28 NOTE — Telephone Encounter (Signed)
Pt cleared medically and form placed in basket.  Will need cards clearance

## 2019-06-28 NOTE — Telephone Encounter (Signed)
Picked up and faxed to [phone].

## 2019-06-28 NOTE — Telephone Encounter (Signed)
   Primary Cardiologist:Kenneth C Hilty, MD  Chart reviewed as part of pre-operative protocol coverage. It does not look like patient has been seen in our office since 2014 and has only been seen in the [facility] since then. Therefore, she will require a follow-up visit in order to better assess preoperative cardiovascular risk.  Pre-op covering staff: - Please schedule appointment and call patient to inform them. - Please contact requesting surgeon's office via preferred method (i.e, phone, fax) to inform them of need for appointment prior to surgery.  I will also route message to pharmacy pool for input on holding Xarelto so that this information is available at time of patient's appointment.   Darreld Mclean, PA-C  06/28/2019, 5:11 PM

## 2019-06-28 NOTE — Telephone Encounter (Signed)
Please advise? Pt had last EKG 09/2018

## 2019-06-28 NOTE — Telephone Encounter (Signed)
   [facility] Pre-operative Risk Assessment    Request for surgical clearance:  1. What type of surgery is being performed? Right shoulder scope   2. When is this surgery scheduled? ASAP   3. What type of clearance is required (medical clearance vs. Pharmacy clearance to hold med vs. Both)? both  4. Are there any medications that need to be held prior to surgery and how long?  Xarelto  5. Practice name and name of physician performing surgery? [facility] Dr. French Ana   6. What is your office phone number [phone] ext: [phone]    4.   What is your office fax number [phone]  8.   Anesthesia type (None, local, MAC, general) ?    Yovan Leeman A Miranda Frese 06/28/2019, 4:29 PM  _________________________________________________________________   (provider comments below)

## 2019-06-28 NOTE — Telephone Encounter (Signed)
Please advise 

## 2019-06-28 NOTE — Telephone Encounter (Signed)
Pt called in stating that [facility] will be sending over a surgical clearance for the pt to have shoulder surgery. They are trying to get her scheduled as soon as possible. I will place in the bin when we receive the form.

## 2019-06-29 NOTE — Telephone Encounter (Signed)
I have s/w the pt in regards to needing an appt for pre op clearance. Pt last seen 2014 with Dr. Debara Pickett. Looks like one of the schedulers made a new pt appt for 07/30/19 @ 9:45 with Dr. Debara Pickett. I reviewed the schedule to see if there was anything sooner. I stated to the pt since we have not seen her since 2014 I am wondering if we may be able to have another one of our cardiologist see her as a new pt since we have not seen her since 2014. I assured her that I am going to send a message to the schedulers to see if we can find a sooner new pt appt. I assured her that will have the schedulers call her a sooner appt if possible. Pt said ok to leave a detailed message on her phone. Pt thanked me for the call.

## 2019-06-29 NOTE — Telephone Encounter (Signed)
Patient with diagnosis of atrial fibrillation on Xarelto for anticoagulation.    Procedure: right shoulder scope Date of procedure: ASAP  CHADS2-VASc score of  2 (AGE, female)  CrCl 101.6 Platelet count 242  Per office protocol, patient can hold Xarelto for 3 days prior to procedure.

## 2019-06-29 NOTE — Telephone Encounter (Signed)
I called pt to be sure she has been made aware the appt has been moved up sooner to see Dr. Debara Pickett 07/04/19 for pre op clearance. Pt very grateful the all of the help we have given her. I will send note to Dr. Debara Pickett for upcoming appt as well as FYI to the surgeon Dr. French Ana. I will remove from the pre op call back pool.

## 2019-07-01 ENCOUNTER — Encounter: Payer: Self-pay | Admitting: Family Medicine

## 2019-07-02 MED ORDER — ZOLPIDEM TARTRATE 10 MG PO TABS: 10.0000 mg | ORAL_TABLET | Freq: Every day | ORAL | 3 refills | Status: DC

## 2019-07-02 MED ORDER — ALPRAZOLAM 0.5 MG PO TABS: 0.5000 mg | ORAL_TABLET | Freq: Two times a day (BID) | ORAL | 1 refills | Status: DC | PRN

## 2019-07-02 NOTE — Telephone Encounter (Signed)
Last OV 05/21/19 Zolpidem last filled 03/13/19 #30 with 3 Alprazolam last filled 02/19/19 #60 with 1

## 2019-07-04 ENCOUNTER — Encounter: Payer: Self-pay | Admitting: Internal Medicine

## 2019-07-04 ENCOUNTER — Other Ambulatory Visit: Payer: Self-pay

## 2019-07-04 ENCOUNTER — Ambulatory Visit (INDEPENDENT_AMBULATORY_CARE_PROVIDER_SITE_OTHER): Payer: Medicare Other | Admitting: Internal Medicine

## 2019-07-04 VITALS — BP 158/92 | HR 60 | Ht 65.0 in | Wt 181.6 lb

## 2019-07-04 DIAGNOSIS — Z01810 Encounter for preprocedural cardiovascular examination: Secondary | ICD-10-CM | POA: Diagnosis not present

## 2019-07-04 DIAGNOSIS — I493 Ventricular premature depolarization: Secondary | ICD-10-CM | POA: Diagnosis not present

## 2019-07-04 DIAGNOSIS — I351 Nonrheumatic aortic (valve) insufficiency: Secondary | ICD-10-CM

## 2019-07-04 DIAGNOSIS — I480 Paroxysmal atrial fibrillation: Secondary | ICD-10-CM | POA: Diagnosis not present

## 2019-07-04 NOTE — Progress Notes (Signed)
OFFICE NOTE  Chief Complaint:  Preoperative clearance  Primary Care Physician: [name], MD  HPI:  Lorraine [name] is a pleasant 69 year old female who works as a Freight forwarder at [employer].  She was referred to [place] for evaluation of missed or skipped beats. She does not report any awareness of palpitations, however she notices that she occasionally skips beats. This actually may occur on a daily basis. Has been present for many years, however recently was brought to her attention by her primary care doctor. She is actually fairly active, and belongs to the [employer]. She is often swimming or doing land exercises, and does not have any significant limitation to exercise. She denies any chest pain or worsening shortness of breath. Recently she was trying to lose weight and was prescribed phentermine. This did cause marked increase in her palpitations and she discontinued the medicine secondary to that.  07/04/2019  Lorraine [name] is seen today for preoperative clearance.  She is considered a new patient as I last saw her in 2014 for PVCs.  In January 2020 she underwent surgery and postoperatively was found to have A. fib with RVR.  She was taken to [facility] and ultimately converted.  Since then she has been followed in the A. fib clinic by Adline Peals, PA-C.  He did a thorough work-up including an echo and a Myoview stress test earlier last year.  The stress test was negative for ischemia the echo showed normal LV function however she did have mild aortic insufficiency which was noted on exam.  Symptomatically she is well.  She denies chest pain or worsening shortness of breath however notes significant shoulder pain for which she has been evaluated for surgery.  PMHx:  Past Medical History:  Diagnosis Date  . Anxiety   . Aortic atherosclerosis ([place]) 02/03/2018   Noted on CT Abd/Pelvis  . AR (aortic regurgitation) 07/18/2017   Mild, noted on ECHO  . Bilateral cataracts    . Bilateral plantar fasciitis   . Chronic knee pain   . Chronic low back pain   . Chronic lumbar radiculopathy 07/14/2017   Mild, L5, noted on electromyography  . History of palpitations    2014-- low risk exercise tolerence test, no ischemi, PVC's (09-06-2012)  . HSV-1 (herpes simplex virus 1) infection    Cold sore  . Hx of colonic polyps   . Hyperlipidemia   . Insomnia   . Lactose intolerance   . Left nephrolithiasis 02/03/2018   Nonobstructing, noted on CT Abd/pelvis  . Morton's metatarsalgia, neuralgia, or neuroma, bilateral   . Osteoarthritis    knees and lumbar, feet  . Osteopenia   . Peripheral neuropathy    bilateral feet -- burning and stinging  . PONV (postoperative nausea and vomiting)    severe  . PVC's (premature ventricular contractions)   . Systolic murmur    "Slight"  . Vaginal cyst   . Wears glasses     Past Surgical History:  Procedure Laterality Date  . ANTERIOR CERVICAL DECOMP/DISCECTOMY FUSION  01/2014   C4 -- C6  . CATARACT EXTRACTION W/ INTRAOCULAR LENS  IMPLANT, BILATERAL  2015  . COLONOSCOPY  05/14/2015  . CYSTO WITH HYDRODISTENSION N/A 04/14/2018   Procedure: CYSTOSCOPY/HYDRODISTENSION WITH INSTILLATION OF PYRIDIUM AND MARCAINE, BLADDER BIOPSY WITH FULGERATION 0.5 TO 2 CM;  Surgeon: [name], MD;  Location: [facility];  Service: Urology;  Laterality: N/A;  . ELBOW DEBRIDEMENT Right 09/10/2010   and  tendon debridement and radial tunnel release  . EXCISION MORTON'S NEUROMA Bilateral left 02-20-2009/  right & left 05-08-2009  . EXCISION VAGINAL CYST N/A 07/01/2016   Procedure: EXCISION VAGINAL CYST;  Surgeon: Tyson Dense, MD;  Location: [facility];  Service: Gynecology;  Laterality: N/A;  . PLANTAR FASCIA RELEASE Bilateral left 04-02-2010/  right 04-02-2016  . TONSILLECTOMY AND ADENOIDECTOMY  child  . TOTAL ABDOMINAL HYSTERECTOMY  1974   w/  Left salpingoophorectomy and Partial right  salpingoophorectomy    FAMHx:  Family History  Problem Relation Age of Onset  . Lung cancer Mother        lung  . Heart attack Father   . Memory loss Father   . Cancer Paternal Grandfather        colon  . Multiple sclerosis Brother    No history of palpitations in the family.  SOCHx:   reports that she has never smoked. She has never used smokeless tobacco. She reports current alcohol use. She reports that she does not use drugs.  ALLERGIES:  Allergies  Allergen Reactions  . Amitriptyline     Bladder retention  . Codeine Nausea And Vomiting  . Nortriptyline     Bladder retention     ROS: Pertinent items noted in HPI and remainder of comprehensive ROS otherwise negative.  HOME MEDS: Current Outpatient Medications  Medication Sig Dispense Refill  . ALPRAZolam (XANAX) 0.5 MG tablet Take 1 tablet (0.5 mg total) by mouth 2 (two) times daily as needed for anxiety. 60 tablet 1  . DILAUDID 2 MG tablet Take 2 mg by mouth every 8 (eight) hours as needed.    . meloxicam (MOBIC) 15 MG tablet Take 1 tablet (15 mg total) by mouth daily. 30 tablet 1  . metoprolol tartrate (LOPRESSOR) 25 MG tablet Take 1/2-1 tablet by mouth every 6-8hours for breakthrough afib for heart rates over 100 90 tablet 3  . polyvinyl alcohol (LIQUIFILM TEARS) 1.4 % ophthalmic solution Place 1 drop into both eyes as needed for dry eyes.    . rivaroxaban (XARELTO) 20 MG TABS tablet Take 1 tablet (20 mg total) by mouth daily with supper. 30 tablet 6  . rosuvastatin (CRESTOR) 10 MG tablet Take 1 tablet (10 mg total) by mouth daily. 90 tablet 1  . tamoxifen (NOLVADEX) 20 MG tablet Take 0.5 tablets (10 mg total) by mouth daily. (Patient taking differently: Take 5 mg by mouth daily. Patient cuts her 20mg  tablets into 4's and takes only 5mg  daily.) 90 tablet 0  . valACYclovir (VALTREX) 1000 MG tablet TAKE 1 TABLET BY MOUTH EVERY DAY 30 tablet 5  . zolpidem (AMBIEN) 10 MG tablet Take 1 tablet (10 mg total) by mouth at  bedtime. 30 tablet 3   No current facility-administered medications for this visit.    LABS/IMAGING: No results found for this or any previous visit (from the past 48 hour(s)). No results found.  VITALS: BP (!) 158/92   Pulse 60   Ht 5\' 5"  (1.651 m)   Wt 181 lb 9.6 oz (82.4 kg)   SpO2 98%   BMI 30.22 kg/m   EXAM: General appearance: alert and no distress Neck: no adenopathy, no carotid bruit, no JVD, supple, symmetrical, trachea midline and thyroid not enlarged, symmetric, no tenderness/mass/nodules Lungs: clear to auscultation bilaterally Heart: regular rate and rhythm and occasional missed beats Abdomen: soft, non-tender; bowel sounds normal; no masses,  no organomegaly Extremities: extremities normal, atraumatic, no cyanosis or edema Pulses: 2+ and  symmetric Skin: Skin color, texture, turgor normal. No rashes or lesions Neurologic: Grossly normal  EKG: Sinus rhythm PVCs at 60-personally reviewed  ASSESSMENT: 1. Paroxysmal atrial fibrillation-CHA2DS2-VASc score of 2, on Xarelto 2. PVCs 3. Dyslipidemia 4. Aortic insufficiency 5. Acceptable risk for upcoming surgery  PLAN: 1.   Lorraine [name] is at acceptable risk for upcoming surgery.  She had a low risk Myoview in February 2020 and had an echo which showed some mild aortic insufficiency in 2019.  She will need follow-up of that which we can do at another date.  She denies any anginal symptoms.  She is tolerating Xarelto which she can hold 3 days prior to surgery.  Restart Xarelto after based on bleeding risk.  She wishes to follow-up with me at 6 months.  [name], MD, [facility], [place] Director of the [facility] Lipid Disorders &  Cardiovascular Risk Reduction Clinic Diplomate of the American Board of Clinical Lipidology Attending Cardiologist  Direct Dial: [phone]  Fax: [phone]  Website:  [email or web address] 07/04/2019, 2:38 PM

## 2019-07-04 NOTE — Patient Instructions (Signed)

## 2019-07-05 ENCOUNTER — Telehealth: Payer: Self-pay | Admitting: Family Medicine

## 2019-07-05 NOTE — Progress Notes (Signed)
  Chronic Care Management   Outreach Note  07/05/2019 Name: Lorraine [name] MRN: [id number] DOB: 1950/11/21  Referred by: [name], MD Reason for referral : No chief complaint on file.   An unsuccessful telephone outreach was attempted today. The patient was referred to the pharmacist for assistance with care management and care coordination.   Follow Up Plan:   Earney Hamburg Upstream Scheduler

## 2019-07-09 ENCOUNTER — Ambulatory Visit: Payer: Medicare Other | Admitting: Medical

## 2019-07-11 DIAGNOSIS — S46011A Strain of muscle(s) and tendon(s) of the rotator cuff of right shoulder, initial encounter: Secondary | ICD-10-CM | POA: Diagnosis not present

## 2019-07-11 DIAGNOSIS — M19011 Primary osteoarthritis, right shoulder: Secondary | ICD-10-CM | POA: Diagnosis not present

## 2019-07-11 DIAGNOSIS — M75121 Complete rotator cuff tear or rupture of right shoulder, not specified as traumatic: Secondary | ICD-10-CM | POA: Diagnosis not present

## 2019-07-11 DIAGNOSIS — M7551 Bursitis of right shoulder: Secondary | ICD-10-CM | POA: Diagnosis not present

## 2019-07-11 DIAGNOSIS — G8918 Other acute postprocedural pain: Secondary | ICD-10-CM | POA: Diagnosis not present

## 2019-07-11 DIAGNOSIS — M24111 Other articular cartilage disorders, right shoulder: Secondary | ICD-10-CM | POA: Diagnosis not present

## 2019-07-11 DIAGNOSIS — M7541 Impingement syndrome of right shoulder: Secondary | ICD-10-CM | POA: Diagnosis not present

## 2019-07-17 DIAGNOSIS — M19011 Primary osteoarthritis, right shoulder: Secondary | ICD-10-CM | POA: Diagnosis not present

## 2019-07-18 DIAGNOSIS — M19011 Primary osteoarthritis, right shoulder: Secondary | ICD-10-CM | POA: Diagnosis not present

## 2019-07-18 DIAGNOSIS — M7551 Bursitis of right shoulder: Secondary | ICD-10-CM | POA: Diagnosis not present

## 2019-07-18 DIAGNOSIS — M25511 Pain in right shoulder: Secondary | ICD-10-CM | POA: Diagnosis not present

## 2019-07-18 DIAGNOSIS — M24111 Other articular cartilage disorders, right shoulder: Secondary | ICD-10-CM | POA: Diagnosis not present

## 2019-07-18 DIAGNOSIS — M7501 Adhesive capsulitis of right shoulder: Secondary | ICD-10-CM | POA: Diagnosis not present

## 2019-07-18 DIAGNOSIS — M75101 Unspecified rotator cuff tear or rupture of right shoulder, not specified as traumatic: Secondary | ICD-10-CM | POA: Diagnosis not present

## 2019-07-26 DIAGNOSIS — M25511 Pain in right shoulder: Secondary | ICD-10-CM | POA: Diagnosis not present

## 2019-07-26 DIAGNOSIS — M7501 Adhesive capsulitis of right shoulder: Secondary | ICD-10-CM | POA: Diagnosis not present

## 2019-07-26 DIAGNOSIS — M24111 Other articular cartilage disorders, right shoulder: Secondary | ICD-10-CM | POA: Diagnosis not present

## 2019-07-26 DIAGNOSIS — M7551 Bursitis of right shoulder: Secondary | ICD-10-CM | POA: Diagnosis not present

## 2019-07-26 DIAGNOSIS — M19011 Primary osteoarthritis, right shoulder: Secondary | ICD-10-CM | POA: Diagnosis not present

## 2019-07-26 DIAGNOSIS — M75101 Unspecified rotator cuff tear or rupture of right shoulder, not specified as traumatic: Secondary | ICD-10-CM | POA: Diagnosis not present

## 2019-07-30 ENCOUNTER — Ambulatory Visit: Payer: Medicare Other | Admitting: Internal Medicine

## 2019-08-02 DIAGNOSIS — M25511 Pain in right shoulder: Secondary | ICD-10-CM | POA: Diagnosis not present

## 2019-08-02 DIAGNOSIS — M75101 Unspecified rotator cuff tear or rupture of right shoulder, not specified as traumatic: Secondary | ICD-10-CM | POA: Diagnosis not present

## 2019-08-02 DIAGNOSIS — M19011 Primary osteoarthritis, right shoulder: Secondary | ICD-10-CM | POA: Diagnosis not present

## 2019-08-02 DIAGNOSIS — M7501 Adhesive capsulitis of right shoulder: Secondary | ICD-10-CM | POA: Diagnosis not present

## 2019-08-02 DIAGNOSIS — M7551 Bursitis of right shoulder: Secondary | ICD-10-CM | POA: Diagnosis not present

## 2019-08-02 DIAGNOSIS — M24111 Other articular cartilage disorders, right shoulder: Secondary | ICD-10-CM | POA: Diagnosis not present

## 2019-08-06 DIAGNOSIS — M19011 Primary osteoarthritis, right shoulder: Secondary | ICD-10-CM | POA: Diagnosis not present

## 2019-08-06 DIAGNOSIS — M24111 Other articular cartilage disorders, right shoulder: Secondary | ICD-10-CM | POA: Diagnosis not present

## 2019-08-06 DIAGNOSIS — M75101 Unspecified rotator cuff tear or rupture of right shoulder, not specified as traumatic: Secondary | ICD-10-CM | POA: Diagnosis not present

## 2019-08-06 DIAGNOSIS — M25511 Pain in right shoulder: Secondary | ICD-10-CM | POA: Diagnosis not present

## 2019-08-06 DIAGNOSIS — M7501 Adhesive capsulitis of right shoulder: Secondary | ICD-10-CM | POA: Diagnosis not present

## 2019-08-06 DIAGNOSIS — M7551 Bursitis of right shoulder: Secondary | ICD-10-CM | POA: Diagnosis not present

## 2019-08-16 DIAGNOSIS — M75101 Unspecified rotator cuff tear or rupture of right shoulder, not specified as traumatic: Secondary | ICD-10-CM | POA: Diagnosis not present

## 2019-08-16 DIAGNOSIS — M25511 Pain in right shoulder: Secondary | ICD-10-CM | POA: Diagnosis not present

## 2019-08-16 DIAGNOSIS — M19011 Primary osteoarthritis, right shoulder: Secondary | ICD-10-CM | POA: Diagnosis not present

## 2019-08-16 DIAGNOSIS — M24111 Other articular cartilage disorders, right shoulder: Secondary | ICD-10-CM | POA: Diagnosis not present

## 2019-08-16 DIAGNOSIS — M7551 Bursitis of right shoulder: Secondary | ICD-10-CM | POA: Diagnosis not present

## 2019-08-16 DIAGNOSIS — M7501 Adhesive capsulitis of right shoulder: Secondary | ICD-10-CM | POA: Diagnosis not present

## 2019-08-17 ENCOUNTER — Telehealth: Payer: Self-pay | Admitting: *Deleted

## 2019-08-17 NOTE — Telephone Encounter (Signed)
08/17/2019 at 10:21am- COMET CESD Alert study note- The research nurse was contacted by Yolande Jolly, [place] study coordinator, on 08/17/2019 that this pt scored above a threshold on a depression screening measure on her most recent month 12 COMET questionnaire.  The study requires that sites acknowledge the pt's depressive score and address this issue with the pt.  Therefore, the pt's research nurse called the patient to check on her status this morning.  The pt thanked the nurse for calling to check on her.  The pt stated her "mind is in a good place", and she doesn't need to talk to someone.  The pt said that her life has been impacted by her recent shoulder surgery.  The pt reported pain issues along with some mobility issues.  The pt said that she has improved well over the past 5 weeks, and she states she is looking forward to going back to work soon. The pt specifically declined needing any intervention at this time.  Dr. Lindi Adie, the pt's treating doctor, was informed that the pt was contacted and no further intervention is needed at this time.  The pt knows to call the research nurse or her doctor if she needs a referral for any mental health resources in the future.  The pt was thanked for her continued support and compliance on the COMET study.  Brion Aliment RN, BSN, CCRP  Clinical Research Nurse 08/17/2019 10:32 AM

## 2019-08-20 DIAGNOSIS — M7551 Bursitis of right shoulder: Secondary | ICD-10-CM | POA: Diagnosis not present

## 2019-08-20 DIAGNOSIS — M25511 Pain in right shoulder: Secondary | ICD-10-CM | POA: Diagnosis not present

## 2019-08-20 DIAGNOSIS — M24111 Other articular cartilage disorders, right shoulder: Secondary | ICD-10-CM | POA: Diagnosis not present

## 2019-08-20 DIAGNOSIS — M75101 Unspecified rotator cuff tear or rupture of right shoulder, not specified as traumatic: Secondary | ICD-10-CM | POA: Diagnosis not present

## 2019-08-20 DIAGNOSIS — M7501 Adhesive capsulitis of right shoulder: Secondary | ICD-10-CM | POA: Diagnosis not present

## 2019-08-20 DIAGNOSIS — M19011 Primary osteoarthritis, right shoulder: Secondary | ICD-10-CM | POA: Diagnosis not present

## 2019-08-23 DIAGNOSIS — M19011 Primary osteoarthritis, right shoulder: Secondary | ICD-10-CM | POA: Diagnosis not present

## 2019-08-23 DIAGNOSIS — M25511 Pain in right shoulder: Secondary | ICD-10-CM | POA: Diagnosis not present

## 2019-08-23 DIAGNOSIS — M7501 Adhesive capsulitis of right shoulder: Secondary | ICD-10-CM | POA: Diagnosis not present

## 2019-08-23 DIAGNOSIS — M75101 Unspecified rotator cuff tear or rupture of right shoulder, not specified as traumatic: Secondary | ICD-10-CM | POA: Diagnosis not present

## 2019-08-23 DIAGNOSIS — M24111 Other articular cartilage disorders, right shoulder: Secondary | ICD-10-CM | POA: Diagnosis not present

## 2019-08-23 DIAGNOSIS — M7551 Bursitis of right shoulder: Secondary | ICD-10-CM | POA: Diagnosis not present

## 2019-08-28 ENCOUNTER — Ambulatory Visit
Admission: RE | Admit: 2019-08-28 | Discharge: 2019-08-28 | Disposition: A | Discharge status: Home / Self Care | Payer: Medicare Other | Source: Ambulatory Visit | Attending: Hematology and Oncology | Admitting: Hematology and Oncology

## 2019-08-28 ENCOUNTER — Other Ambulatory Visit: Payer: Self-pay

## 2019-08-28 ENCOUNTER — Other Ambulatory Visit: Payer: Self-pay | Admitting: Hematology and Oncology

## 2019-08-28 DIAGNOSIS — Z170 Estrogen receptor positive status [ER+]: Secondary | ICD-10-CM

## 2019-08-28 DIAGNOSIS — C50312 Malignant neoplasm of lower-inner quadrant of left female breast: Secondary | ICD-10-CM

## 2019-08-28 DIAGNOSIS — R928 Other abnormal and inconclusive findings on diagnostic imaging of breast: Secondary | ICD-10-CM | POA: Diagnosis not present

## 2019-09-04 ENCOUNTER — Telehealth: Payer: Self-pay | Admitting: Hematology and Oncology

## 2019-09-04 NOTE — Progress Notes (Signed)
  HEMATOLOGY-ONCOLOGY TELEPHONE VISIT PROGRESS NOTE  I connected with Lorraine [name] on 09/05/2019 at  8:00 AM EDT by telephone and verified that I am speaking with the correct person using two identifiers.  I discussed the limitations, risks, security and privacy concerns of performing an evaluation and management service by telephone and the availability of in person appointments.  I also discussed with the patient that there may be a patient responsible charge related to this service. The patient expressed understanding and agreed to proceed.   History of Present Illness: Lorraine [name] is a 69 y.o. female with above-mentioned history of left breast DCIS currently on the COMET clinical trial, randomized to the active surveillance arm. She is currently on tamoxifen 5mg  daily. Mammogram on 08/28/19 showed stable appearance of left breast calcifications and no new suspcious findings. She presents over the phone today for follow-up. Recent shoulder surgery (probably because of heavy yard work): 07/06/19 Doing much better with PT  Oncology History  Malignant neoplasm of lower-inner quadrant of left breast in female, estrogen receptor positive ([place])  07/26/2018 Initial Diagnosis   Screening mammogram detected a 10cm span of calcifications in the left breast, biopsy confirmed intermediate grade DCIS, ER 100%, PR 100%.    07/26/2018 Cancer Staging   Staging form: Breast, AJCC 8th Edition - Clinical: Stage 0 (cTis (DCIS), cN0, cM0, ER+, PR+) - Signed by Gardenia Phlegm, NP on 07/26/2018   03/20/2019 -  Anti-estrogen oral therapy   Tamoxifen, 5mg  daily     Observations/Objective:     Assessment Plan:  Malignant neoplasm of lower-inner quadrant of left breast in female, estrogen receptor positive ([place]) 07/17/2018:Screening mammogram detected a 10cm span of calcifications in the left breast, biopsy confirmed intermediate grade DCIS, ER 100%, PR 100% Stage 0  Current treatment: 1.  COMETclinical trial randomized to active surveillance arm 2.Tamoxifen 20 mg daily started 09/20/2018 stopped 11/01/2018.  Vaginal dryness ([name] treatment 11/16/2018) restarted tamoxifen 5 mg 03/30/2019  Tamoxifen toxicities: Hot flashes: mild Vaginal dryness: much improved  Breast cancer surveillance: Mammogram 08/28/2019: Stable appearance of pleomorphic calcifications lower central and lower inner left breast consistent with known DCIS. No new or additional calcifications noted.  Depression questionnaire: Patient scored above the threshold for depression. However she denied any current episode of depression and it was a transient phase. No current issues with depression. Recent shoulder surgery: She is now 8 weeks from surgery and is undergoing physical therapy.  The cause of the shoulder tear is heavy lifting in her yard.  Patient will come back in 6 months with a mammogram and follow-up.  I discussed the assessment and treatment plan with the patient. The patient was provided an opportunity to ask questions and all were answered. The patient agreed with the plan and demonstrated an understanding of the instructions. The patient was advised to call back or seek an in-person evaluation if the symptoms worsen or if the condition fails to improve as anticipated.   I provided 15 minutes of non-face-to-face time during this encounter.   Rulon Eisenmenger, MD 09/05/2019    I, Molly Dorshimer, am acting as scribe for Nicholas Lose, MD.  I have reviewed the above documentation for accuracy and completeness, and I agree with the above.

## 2019-09-05 ENCOUNTER — Inpatient Hospital Stay: Payer: Medicare Other | Attending: Hematology and Oncology | Admitting: Hematology and Oncology

## 2019-09-05 ENCOUNTER — Encounter: Payer: Self-pay | Admitting: *Deleted

## 2019-09-05 DIAGNOSIS — C50312 Malignant neoplasm of lower-inner quadrant of left female breast: Secondary | ICD-10-CM

## 2019-09-05 DIAGNOSIS — Z170 Estrogen receptor positive status [ER+]: Secondary | ICD-10-CM | POA: Diagnosis not present

## 2019-09-05 DIAGNOSIS — Z006 Encounter for examination for normal comparison and control in clinical research program: Secondary | ICD-10-CM | POA: Diagnosis not present

## 2019-09-05 MED ORDER — MELOXICAM 15 MG PO TABS: 7.5000 mg | ORAL_TABLET | Freq: Every day | ORAL | 1 refills | Status: DC

## 2019-09-05 NOTE — Research (Signed)
09/05/2019 at 2:09pm- AFT 25/COMET month 12 study notes-   COMET: Month 12 Follow-up- Lorraine [name] Patient requested her month 12 visit be a telephone visit due to her recent shoulder surgery in April.  Dr. Lindi Adie agreed that it was fine to conduct this follow visit over the phone.  The research nurse called the pt after Dr. Geralyn Flash call to further review the Study's Solicited AE's with the pt.   H&P: Telephone Visit per patient request.  Dr. Lindi Adie reviewed with patient all her questions/concerns today. Please see MD's notes from today.The site reported the missed physical exam (study procedure) in [facility] and informed Vernetta Honey, Biochemist, clinical, for reporting purposes.    Mammogram: Dr. Lindi Adie reviewed the pt's recent mammogram from 08/28/19.  Next mammogram scheduled for 02/29/2020.  Questionnaires:Patient was thanked for completion of the required questionnaires at this timepoint. Dr. Lindi Adie followed up with the pt today regarding her CEDS alert from 08/17/19.  MD note states no current issues with depression.    Study Solicited Adverse Events: Dr. Lindi Adie reviewed the COMET list of solicited Adverse Events with the pt over the telephone.  The pt is currently taking tamoxifen 5 mg.   MD's note states pt re-started tamoxifen on 03/30/19.  Please see chart below.  Plan: Patient will be scheduled to return for next study visit, month 18 in December 2021 along with her mammogram.  Patient was thanked today for her time and continued support of study and was encouraged to call Dr. Lindi Adie or myself with any questions or concerns she may have.  Adverse Event Log Study/Protocol:COMET Cycle:63Month Follow up- Research nurse met with Dr. Lindi Adie on 09/05/19 to obtain attributions.  Event Grade Attribution Comments  Arthralgia 2 Unrelated Baseline comorbidity of arthritis- pt takes Mobic  High cholesterol   1  Unrelated Baseline comorbidity, on Crestor  Hot flashes  1 Definite  MD reported as tamoxifen toxicity.  Osteopenia  1 Unrelated Per pt report.  Vaginal dryness 0  Pt denies symptoms of vaginal dryness  No other "solicited adverse events" present.  Grade 0 AE's:  Fever, myalgia, hypertension, nausea, fracture, vaginal dryness, and allergic reaction.   Not evaluated Solicited AE's include:  Acute Coronary Syndrome and Ischemia Cerebrovascular   Brion Aliment RN, BSN, CCRP Clinical Research Nurse 09/05/2019 2:20 PM

## 2019-09-05 NOTE — Assessment & Plan Note (Signed)
07/17/2018:Screening mammogram detected a 10cm span of calcifications in the left breast, biopsy confirmed intermediate grade DCIS, ER 100%, PR 100% Stage 0  Current treatment: 1. COMETclinical trial randomized to active surveillance arm 2.Tamoxifen 20 mg daily started 09/20/2018 stopped 11/01/2018.  Vaginal dryness ([name] treatment 11/16/2018) restarted tamoxifen 5 mg 03/30/2019  Tamoxifen toxicities: Hot flashes Vaginal dryness and frequent UTIs: fine since we decreased the dose.  Breast cancer surveillance: Mammogram 08/28/2019: Stable appearance of pleomorphic calcifications lower central and lower inner left breast consistent with known DCIS. No new or additional calcifications noted.  Depression questionnaire: Patient scored above the threshold for depression. However she denied any current episode of depression and it was a transient phase. No current issues with depression.  Patient will come back in 6 months with a mammogram and follow-up.

## 2019-09-07 ENCOUNTER — Telehealth: Payer: Self-pay | Admitting: Hematology and Oncology

## 2019-09-07 NOTE — Telephone Encounter (Signed)
Scheduled appt per 6/23 los. Left voicemail with appt date and time.

## 2019-09-10 ENCOUNTER — Other Ambulatory Visit: Payer: Self-pay | Admitting: Family Medicine

## 2019-09-10 NOTE — Telephone Encounter (Signed)
Last OV Alprazolam last filled 07/02/19 #60 with 1

## 2019-09-11 ENCOUNTER — Other Ambulatory Visit (HOSPITAL_COMMUNITY): Payer: Self-pay | Admitting: Physician Assistant

## 2019-09-14 ENCOUNTER — Encounter: Payer: Self-pay | Admitting: Physician Assistant

## 2019-09-14 ENCOUNTER — Telehealth (INDEPENDENT_AMBULATORY_CARE_PROVIDER_SITE_OTHER): Payer: Medicare Other | Admitting: Physician Assistant

## 2019-09-14 VITALS — BP 147/80 | HR 53 | Temp 98.0°F | Ht 65.0 in | Wt 166.0 lb

## 2019-09-14 DIAGNOSIS — J019 Acute sinusitis, unspecified: Secondary | ICD-10-CM

## 2019-09-14 DIAGNOSIS — B9689 Other specified bacterial agents as the cause of diseases classified elsewhere: Secondary | ICD-10-CM

## 2019-09-14 MED ORDER — BENZONATATE 100 MG PO CAPS: 100.0000 mg | ORAL_CAPSULE | Freq: Three times a day (TID) | ORAL | 0 refills | Status: DC | PRN

## 2019-09-14 MED ORDER — AMOXICILLIN-POT CLAVULANATE 875-125 MG PO TABS: 1.0000 | ORAL_TABLET | Freq: Two times a day (BID) | ORAL | 0 refills | Status: DC

## 2019-09-14 NOTE — Progress Notes (Signed)
Virtual Visit via Video   I connected with patient on 09/14/19 at  4:00 PM EDT by a video enabled telemedicine application and verified that I am speaking with the correct person using two identifiers.  Location patient: Home Location provider: [facility], Office Persons participating in the virtual visit: Patient, Provider, [place] (Patina Moore)  I discussed the limitations of evaluation and management by telemedicine and the availability of in person appointments. The patient expressed understanding and agreed to proceed.  Subjective:   HPI:   Patient presents via [facility] today c/o 2+ weeks of sinus pressure, sinus headache, fatigue, nasal congestion and maxillary pain. Notes cough that is mostly dry but sometimes productive of clear sputum, now thicker. Has noted a popping sensation in ears when blowing her nose. Denies recent travel or sick contact. Denies fever, chills, malaise or fatigue.   ROS:   See pertinent positives and negatives per HPI.  Patient Active Problem List   Diagnosis Date Noted   Acquired thrombophilia ([place]) 03/08/2019   Malignant neoplasm of lower-inner quadrant of left breast in female, estrogen receptor positive ([place]) 07/26/2018   Anxiety about health 05/31/2018   Paroxysmal atrial fibrillation ([place]) 05/18/2018   Overweight (BMI 25.0-29.9) 18/56/3149   Systolic murmur 70/26/3785   Shingles 11/23/2016   Arthritis 05/26/2016   Vaginal pain 05/26/2016   Insomnia 05/26/2016   Neuropathy 05/26/2016   Positive ANA (antinuclear antibody) 05/26/2016   Hyperlipidemia 05/26/2016   Neck and shoulder pain 01/26/2014   Cervicalgia 01/26/2014   Palpitations 08/31/2012   METATARSALGIA 05/06/2010   PLANTAR FASCIITIS, LEFT 05/06/2010   FOOT PAIN, BILATERAL 05/06/2010    Social History   Tobacco Use   Smoking status: Never Smoker   Smokeless tobacco: Never Used  Substance Use Topics   Alcohol use: Yes    Comment: rare     Current Outpatient Medications:    ALPRAZolam (XANAX) 0.5 MG tablet, TAKE 1 TABLET (0.5 MG TOTAL) BY MOUTH 2 (TWO) TIMES DAILY AS NEEDED FOR ANXIETY. (Patient taking differently: Take 0.5 mg by mouth as needed for anxiety. ), Disp: 60 tablet, Rfl: 1   DILAUDID 2 MG tablet, Take 2 mg by mouth as needed. , Disp: , Rfl:    meloxicam (MOBIC) 15 MG tablet, Take 0.5 tablets (7.5 mg total) by mouth daily., Disp: 30 tablet, Rfl: 1   metoprolol tartrate (LOPRESSOR) 25 MG tablet, TAKE 1/2-1 TABLET BY MOUTH EVERY 6-8HOURS FOR BREAKTHROUGH AFIB FOR HEART RATES OVER 100, Disp: 45 tablet, Rfl: 3   polyvinyl alcohol (LIQUIFILM TEARS) 1.4 % ophthalmic solution, Place 1 drop into both eyes as needed for dry eyes., Disp: , Rfl:    rivaroxaban (XARELTO) 20 MG TABS tablet, Take 1 tablet (20 mg total) by mouth daily with supper., Disp: 30 tablet, Rfl: 6   rosuvastatin (CRESTOR) 10 MG tablet, Take 1 tablet (10 mg total) by mouth daily., Disp: 90 tablet, Rfl: 1   tamoxifen (NOLVADEX) 20 MG tablet, Take 0.5 tablets (10 mg total) by mouth daily. (Patient taking differently: Take 5 mg by mouth daily. Patient cuts her 20mg  tablets into 4's and takes only 5mg  daily.), Disp: 90 tablet, Rfl: 0   tiZANidine (ZANAFLEX) 4 MG tablet, Take 2 mg by mouth daily., Disp: , Rfl:    valACYclovir (VALTREX) 1000 MG tablet, TAKE 1 TABLET BY MOUTH EVERY DAY (Patient taking differently: as needed. ), Disp: 30 tablet, Rfl: 5   zolpidem (AMBIEN) 10 MG tablet, Take 1 tablet (10 mg total) by mouth at bedtime.,  Disp: 30 tablet, Rfl: 3  Allergies  Allergen Reactions   Amitriptyline     Bladder retention   Codeine Nausea And Vomiting   Nortriptyline     Bladder retention     Objective:   BP (!) 147/80    Pulse (!) 53    Temp 98 F (36.7 C) (Oral)    Ht 5\' 5"  (1.651 m)    Wt 166 lb (75.3 kg)    BMI 27.62 kg/m   Patient is well-developed, well-nourished in no acute distress.  Resting comfortably  at home.  Head is  normocephalic, atraumatic.  No labored breathing.  Speech is clear and coherent with logical content.  Patient is alert and oriented at baseline.   Assessment and Plan:   1. Acute bacterial sinusitis Rx Augmentin.  Increase fluids.  Rest.  Saline nasal spray.  Probiotic.  Mucinex as directed.  Humidifier in bedroom. Tessalon per orders. Start daily antihistamine.  Call or return to clinic if symptoms are not improving.  - benzonatate (TESSALON) 100 MG capsule; Take 1 capsule (100 mg total) by mouth 3 (three) times daily as needed for cough.  Dispense: 30 capsule; Refill: 0 - amoxicillin-clavulanate (AUGMENTIN) 875-125 MG tablet; Take 1 tablet by mouth 2 (two) times daily.  Dispense: 14 tablet; Refill: 0    Leeanne Rio, [place] 09/14/2019

## 2019-09-14 NOTE — Patient Instructions (Signed)
Instructions sent to the pharmacy.

## 2019-09-18 ENCOUNTER — Encounter: Payer: Self-pay | Admitting: Physician Assistant

## 2019-09-18 MED ORDER — PROMETHAZINE-DM 6.25-15 MG/5ML PO SYRP
5.0000 mL | ORAL_SOLUTION | Freq: Four times a day (QID) | ORAL | 0 refills | Status: DC | PRN
Start: 2019-09-18 — End: 2019-10-09

## 2019-09-19 ENCOUNTER — Encounter: Payer: Self-pay | Admitting: Physician Assistant

## 2019-09-19 MED ORDER — DOXYCYCLINE HYCLATE 100 MG PO TABS: 100.0000 mg | ORAL_TABLET | Freq: Two times a day (BID) | ORAL | 0 refills | Status: DC

## 2019-09-21 ENCOUNTER — Encounter: Payer: Self-pay | Admitting: Physician Assistant

## 2019-09-24 DIAGNOSIS — M24111 Other articular cartilage disorders, right shoulder: Secondary | ICD-10-CM | POA: Diagnosis not present

## 2019-09-24 DIAGNOSIS — M25511 Pain in right shoulder: Secondary | ICD-10-CM | POA: Diagnosis not present

## 2019-09-24 DIAGNOSIS — M7501 Adhesive capsulitis of right shoulder: Secondary | ICD-10-CM | POA: Diagnosis not present

## 2019-09-24 DIAGNOSIS — M75101 Unspecified rotator cuff tear or rupture of right shoulder, not specified as traumatic: Secondary | ICD-10-CM | POA: Diagnosis not present

## 2019-09-24 DIAGNOSIS — M7551 Bursitis of right shoulder: Secondary | ICD-10-CM | POA: Diagnosis not present

## 2019-09-24 DIAGNOSIS — M19011 Primary osteoarthritis, right shoulder: Secondary | ICD-10-CM | POA: Diagnosis not present

## 2019-09-27 DIAGNOSIS — M7501 Adhesive capsulitis of right shoulder: Secondary | ICD-10-CM | POA: Diagnosis not present

## 2019-09-27 DIAGNOSIS — M7551 Bursitis of right shoulder: Secondary | ICD-10-CM | POA: Diagnosis not present

## 2019-09-27 DIAGNOSIS — M75101 Unspecified rotator cuff tear or rupture of right shoulder, not specified as traumatic: Secondary | ICD-10-CM | POA: Diagnosis not present

## 2019-09-27 DIAGNOSIS — M19011 Primary osteoarthritis, right shoulder: Secondary | ICD-10-CM | POA: Diagnosis not present

## 2019-09-27 DIAGNOSIS — M24111 Other articular cartilage disorders, right shoulder: Secondary | ICD-10-CM | POA: Diagnosis not present

## 2019-09-27 DIAGNOSIS — M25511 Pain in right shoulder: Secondary | ICD-10-CM | POA: Diagnosis not present

## 2019-09-28 ENCOUNTER — Encounter: Payer: Self-pay | Admitting: Physician Assistant

## 2019-09-28 MED ORDER — PREDNISONE 10 MG PO TABS
30.0000 mg | ORAL_TABLET | Freq: Every day | ORAL | 0 refills | Status: AC
Start: 2019-09-28 — End: 2019-10-03

## 2019-10-01 DIAGNOSIS — M75101 Unspecified rotator cuff tear or rupture of right shoulder, not specified as traumatic: Secondary | ICD-10-CM | POA: Diagnosis not present

## 2019-10-01 DIAGNOSIS — M24111 Other articular cartilage disorders, right shoulder: Secondary | ICD-10-CM | POA: Diagnosis not present

## 2019-10-01 DIAGNOSIS — M19011 Primary osteoarthritis, right shoulder: Secondary | ICD-10-CM | POA: Diagnosis not present

## 2019-10-01 DIAGNOSIS — M25511 Pain in right shoulder: Secondary | ICD-10-CM | POA: Diagnosis not present

## 2019-10-01 DIAGNOSIS — M7551 Bursitis of right shoulder: Secondary | ICD-10-CM | POA: Diagnosis not present

## 2019-10-01 DIAGNOSIS — M7501 Adhesive capsulitis of right shoulder: Secondary | ICD-10-CM | POA: Diagnosis not present

## 2019-10-03 ENCOUNTER — Encounter: Payer: Self-pay | Admitting: Physician Assistant

## 2019-10-08 ENCOUNTER — Other Ambulatory Visit: Payer: Self-pay | Admitting: Family Medicine

## 2019-10-08 ENCOUNTER — Ambulatory Visit: Payer: Medicare Other | Admitting: Family Medicine

## 2019-10-09 ENCOUNTER — Ambulatory Visit (INDEPENDENT_AMBULATORY_CARE_PROVIDER_SITE_OTHER): Payer: Medicare Other | Admitting: Physician Assistant

## 2019-10-09 ENCOUNTER — Other Ambulatory Visit: Payer: Self-pay

## 2019-10-09 ENCOUNTER — Encounter: Payer: Self-pay | Admitting: Physician Assistant

## 2019-10-09 VITALS — BP 130/70 | HR 54 | Temp 97.9°F | Resp 14 | Ht 65.0 in | Wt 166.0 lb

## 2019-10-09 DIAGNOSIS — H6983 Other specified disorders of Eustachian tube, bilateral: Secondary | ICD-10-CM | POA: Diagnosis not present

## 2019-10-09 DIAGNOSIS — J0110 Acute frontal sinusitis, unspecified: Secondary | ICD-10-CM | POA: Diagnosis not present

## 2019-10-09 MED ORDER — PREDNISONE 10 MG PO TABS: 30.0000 mg | ORAL_TABLET | Freq: Every day | ORAL | 0 refills | Status: AC

## 2019-10-09 MED ORDER — HYDROCODONE-HOMATROPINE 5-1.5 MG/5ML PO SYRP: 5.0000 mL | ORAL_SOLUTION | Freq: Three times a day (TID) | ORAL | 0 refills | Status: DC | PRN

## 2019-10-09 NOTE — Progress Notes (Signed)
Patient presents to clinic today for follow-up of sinusitis. Patient is s/p treatment with antibiotic and addition of a course of steroids. Patient notes significant improvement in her symptoms, no longer having sinus pain or facial pain.  Notes nasal congestion has improved but still having postnasal drainage.  Notes continued pressure and popping of ears bilaterally.  Still having slight residual cough.  Denies chest congestion, chest pain or shortness of breath.   Past Medical History:  Diagnosis Date  . Anxiety   . Aortic atherosclerosis ([place]) 02/03/2018   Noted on CT Abd/Pelvis  . AR (aortic regurgitation) 07/18/2017   Mild, noted on ECHO  . Bilateral cataracts   . Bilateral plantar fasciitis   . Chronic knee pain   . Chronic low back pain   . Chronic lumbar radiculopathy 07/14/2017   Mild, L5, noted on electromyography  . History of palpitations    2014-- low risk exercise tolerence test, no ischemi, PVC's (09-06-2012)  . HSV-1 (herpes simplex virus 1) infection    Cold sore  . Hx of colonic polyps   . Hyperlipidemia   . Insomnia   . Lactose intolerance   . Left nephrolithiasis 02/03/2018   Nonobstructing, noted on CT Abd/pelvis  . Morton's metatarsalgia, neuralgia, or neuroma, bilateral   . Osteoarthritis    knees and lumbar, feet  . Osteopenia   . Peripheral neuropathy    bilateral feet -- burning and stinging  . PONV (postoperative nausea and vomiting)    severe  . PVC's (premature ventricular contractions)   . Systolic murmur    "Slight"  . Vaginal cyst   . Wears glasses     Current Outpatient Medications on File Prior to Visit  Medication Sig Dispense Refill  . ALPRAZolam (XANAX) 0.5 MG tablet TAKE 1 TABLET (0.5 MG TOTAL) BY MOUTH 2 (TWO) TIMES DAILY AS NEEDED FOR ANXIETY. (Patient taking differently: Take 0.5 mg by mouth as needed for anxiety. ) 60 tablet 1  . DILAUDID 2 MG tablet Take 2 mg by mouth as needed.     . meloxicam (MOBIC) 15 MG tablet TAKE 1  TABLET BY MOUTH EVERY DAY 30 tablet 1  . metoprolol tartrate (LOPRESSOR) 25 MG tablet TAKE 1/2-1 TABLET BY MOUTH EVERY 6-8HOURS FOR BREAKTHROUGH AFIB FOR HEART RATES OVER 100 45 tablet 3  . polyvinyl alcohol (LIQUIFILM TEARS) 1.4 % ophthalmic solution Place 1 drop into both eyes as needed for dry eyes.    . rivaroxaban (XARELTO) 20 MG TABS tablet Take 1 tablet (20 mg total) by mouth daily with supper. 30 tablet 6  . rosuvastatin (CRESTOR) 10 MG tablet Take 1 tablet (10 mg total) by mouth daily. 90 tablet 1  . tamoxifen (NOLVADEX) 20 MG tablet Take 0.5 tablets (10 mg total) by mouth daily. (Patient taking differently: Take 5 mg by mouth daily. Patient cuts her 20mg  tablets into 4's and takes only 5mg  daily.) 90 tablet 0  . tiZANidine (ZANAFLEX) 4 MG tablet Take 2 mg by mouth daily.    . valACYclovir (VALTREX) 1000 MG tablet TAKE 1 TABLET BY MOUTH EVERY DAY (Patient taking differently: as needed. ) 30 tablet 5  . zolpidem (AMBIEN) 10 MG tablet Take 1 tablet (10 mg total) by mouth at bedtime. 30 tablet 3   No current facility-administered medications on file prior to visit.    Allergies  Allergen Reactions  . Amitriptyline     Bladder retention  . Codeine Nausea And Vomiting  . Nortriptyline     Bladder  retention     Family History  Problem Relation Age of Onset  . Lung cancer Mother        lung  . Heart attack Father   . Memory loss Father   . Cancer Paternal Grandfather        colon  . Multiple sclerosis Brother     Social History   Socioeconomic History  . Marital status: Divorced    Spouse name: Not on file  . Number of children: 1  . Years of education: 4  . Highest education level: Not on file  Occupational History  . 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 Smoking status: Never Smoker  . Smokeless tobacco: Never Used  Vaping Use  . Vaping Use: Never used  Substance and Sexual Activity  . Alcohol use: Yes     Comment: rare  . Drug use: No  . Sexual activity: Not on file  Other Topics Concern  . Not on file  Social History Narrative   Lives alone in a one story home.  Has one son.  Works as a Counsellor for [employer].  Education: some college.    Social Determinants of Health   Financial Resource Strain:   . Difficulty of Paying Living Expenses:   Food Insecurity:   . Worried About Charity fundraiser in the Last Year:   . Arboriculturist in the Last Year:   Transportation Needs:   . Film/video editor (Medical):   Marland Kitchen Lack of Transportation (Non-Medical):   Physical Activity:   . Days of Exercise per Week:   . Minutes of Exercise per Session:   Stress:   . Feeling of Stress :   Social Connections:   . Frequency of Communication with Friends and Family:   . Frequency of Social Gatherings with Friends and Family:   . Attends Religious Services:   . Active Member of Clubs or Organizations:   . Attends Archivist Meetings:   Marland Kitchen Marital Status:    Review of Systems - See HPI.  All other ROS are negative.  BP (!) 130/70   Pulse 54   Temp 97.9 F (36.6 C) (Temporal)   Resp 14   Ht 5\' 5"  (1.651 m)   Wt 166 lb (75.3 kg)   SpO2 98%   BMI 27.62 kg/m   Physical Exam Vitals reviewed.  HENT:     Head: Normocephalic and atraumatic.     Right Ear: Ear canal and external ear normal. A middle ear effusion (serous) is present.     Left Ear: Ear canal and external ear normal. A middle ear effusion (serous) is present.     Nose: Nose normal. No mucosal edema.     Mouth/Throat:     Pharynx: Uvula midline. No oropharyngeal exudate or posterior oropharyngeal erythema.  Eyes:     Conjunctiva/sclera: Conjunctivae normal.     Pupils: Pupils are equal, round, and reactive to light.  Neck:     Thyroid: No thyromegaly.  Cardiovascular:     Rate and Rhythm: Normal rate and regular rhythm.     Heart sounds: Normal heart sounds.  Pulmonary:     Effort: Pulmonary effort  is normal. No respiratory distress.     Breath sounds: Normal breath sounds. No wheezing or rales.  Abdominal:     General: Bowel sounds are normal. There is no distension.     Palpations: Abdomen is  soft. There is no mass.     Tenderness: There is no abdominal tenderness. There is no guarding or rebound.  Musculoskeletal:     Cervical back: Neck supple.  Lymphadenopathy:     Cervical: No cervical adenopathy.  Skin:    General: Skin is warm and dry.     Findings: No rash.  Neurological:     Mental Status: She is alert and oriented to person, place, and time.     Cranial Nerves: No cranial nerve deficit.  Psychiatric:        Mood and Affect: Mood normal.    Assessment/Plan: 1. Eustachian tube dysfunction, bilateral Will restart Flonase and start prednisone taper to help as this was the only thing that helped previously. She can use OTC decongestant sparingly - no more than once daily -- giving her health history. Will also have her start saline nasal rinse. ENT if not resolving.  - predniSONE (DELTASONE) 10 MG tablet; Take 3 tablets (30 mg total) by mouth daily with breakfast for 5 days.  Dispense: 15 tablet; Refill: 0  2. Acute non-recurrent frontal sinusitis Resolved. Some residual ETD as noted. Also with mild residual cough that OTC medications are not helping. Will Rx short course of hycodan syrup.  - HYDROcodone-homatropine (HYCODAN) 5-1.5 MG/5ML syrup; Take 5 mLs by mouth every 8 (eight) hours as needed for cough.  Dispense: 120 mL; Refill: 0  This visit occurred during the SARS-CoV-2 public health emergency.  Safety protocols were in place, including screening questions prior to the visit, additional usage of staff PPE, and extensive cleaning of exam room while observing appropriate contact time as indicated for disinfecting solutions.     Leeanne Rio, PA-C

## 2019-10-09 NOTE — Patient Instructions (Signed)
Please continue the Flonase daily.  Ok to continue using the OTc decongestant since nothing else has worked for you. If you note any racing heart with this medication, stop it and let us know.   Take the prednisone daily as directed. I have sent in a new cough syrup for you to use as directed.  Make sure to get an OTC saline nasal rinse (Simply Saline, [place] or pharmacy brand are some options) to use twice daily.   If symptoms are not improving/resolving, we will refer you to ENT.

## 2019-10-12 ENCOUNTER — Telehealth: Payer: Self-pay | Admitting: Family Medicine

## 2019-10-12 DIAGNOSIS — H6990 Unspecified Eustachian tube disorder, unspecified ear: Secondary | ICD-10-CM

## 2019-10-12 DIAGNOSIS — H6980 Other specified disorders of Eustachian tube, unspecified ear: Secondary | ICD-10-CM

## 2019-10-12 NOTE — Telephone Encounter (Signed)
Ok for referral?

## 2019-10-12 NOTE — Telephone Encounter (Signed)
Patient would like a referral to an ENT - Please advise

## 2019-10-12 NOTE — Telephone Encounter (Signed)
Referral placed.

## 2019-10-12 NOTE — Telephone Encounter (Signed)
Per [place] note on 10/09/19 if symptoms of eustachian tube dysfunction are not improving we will refer to ENT. [place] for referral?

## 2019-10-22 DIAGNOSIS — M19012 Primary osteoarthritis, left shoulder: Secondary | ICD-10-CM | POA: Diagnosis not present

## 2019-10-31 ENCOUNTER — Encounter: Payer: Self-pay | Admitting: Family Medicine

## 2019-11-01 MED ORDER — ZOLPIDEM TARTRATE 10 MG PO TABS
10.0000 mg | ORAL_TABLET | Freq: Every day | ORAL | 3 refills | Status: DC
Start: 2019-11-01 — End: 2020-02-12

## 2019-11-01 MED ORDER — ALPRAZOLAM 0.5 MG PO TABS: 0.5000 mg | ORAL_TABLET | Freq: Two times a day (BID) | ORAL | 1 refills | Status: DC | PRN

## 2019-11-05 DIAGNOSIS — H6993 Unspecified Eustachian tube disorder, bilateral: Secondary | ICD-10-CM | POA: Insufficient documentation

## 2019-11-05 DIAGNOSIS — H6983 Other specified disorders of Eustachian tube, bilateral: Secondary | ICD-10-CM | POA: Diagnosis not present

## 2019-11-05 DIAGNOSIS — J029 Acute pharyngitis, unspecified: Secondary | ICD-10-CM | POA: Diagnosis not present

## 2019-11-09 DIAGNOSIS — M7062 Trochanteric bursitis, left hip: Secondary | ICD-10-CM | POA: Diagnosis not present

## 2019-11-10 DIAGNOSIS — M25512 Pain in left shoulder: Secondary | ICD-10-CM | POA: Diagnosis not present

## 2019-11-15 DIAGNOSIS — M25512 Pain in left shoulder: Secondary | ICD-10-CM | POA: Diagnosis not present

## 2019-11-20 DIAGNOSIS — R309 Painful micturition, unspecified: Secondary | ICD-10-CM | POA: Diagnosis not present

## 2019-11-20 DIAGNOSIS — N952 Postmenopausal atrophic vaginitis: Secondary | ICD-10-CM | POA: Diagnosis not present

## 2019-11-20 DIAGNOSIS — N898 Other specified noninflammatory disorders of vagina: Secondary | ICD-10-CM | POA: Diagnosis not present

## 2019-11-20 DIAGNOSIS — N760 Acute vaginitis: Secondary | ICD-10-CM | POA: Diagnosis not present

## 2019-11-20 DIAGNOSIS — N766 Ulceration of vulva: Secondary | ICD-10-CM | POA: Diagnosis not present

## 2019-11-24 DIAGNOSIS — Z23 Encounter for immunization: Secondary | ICD-10-CM | POA: Diagnosis not present

## 2019-11-28 ENCOUNTER — Encounter: Payer: Self-pay | Admitting: Family Medicine

## 2019-11-28 ENCOUNTER — Ambulatory Visit (INDEPENDENT_AMBULATORY_CARE_PROVIDER_SITE_OTHER): Payer: Medicare Other | Admitting: Family Medicine

## 2019-11-28 ENCOUNTER — Other Ambulatory Visit: Payer: Self-pay

## 2019-11-28 VITALS — BP 128/78 | HR 51 | Temp 97.4°F | Resp 15 | Ht 65.0 in | Wt 171.0 lb

## 2019-11-28 DIAGNOSIS — E663 Overweight: Secondary | ICD-10-CM

## 2019-11-28 DIAGNOSIS — M25512 Pain in left shoulder: Secondary | ICD-10-CM | POA: Diagnosis not present

## 2019-11-28 DIAGNOSIS — E785 Hyperlipidemia, unspecified: Secondary | ICD-10-CM | POA: Diagnosis not present

## 2019-11-28 DIAGNOSIS — G8929 Other chronic pain: Secondary | ICD-10-CM | POA: Diagnosis not present

## 2019-11-28 DIAGNOSIS — M25511 Pain in right shoulder: Secondary | ICD-10-CM | POA: Diagnosis not present

## 2019-11-28 LAB — LIPID PANEL
Cholesterol: 174 mg/dL (ref 0–200)
HDL: 54.4 mg/dL (ref >39.00)
LDL Cholesterol: 108 mg/dL — ABNORMAL HIGH (ref 0–99)
NonHDL: 119.73
Total CHOL/HDL Ratio: 3
Triglycerides: 59 mg/dL (ref 0.0–149.0)
VLDL: 11.8 mg/dL (ref 0.0–40.0)

## 2019-11-28 LAB — TSH: TSH: 1.27 u[IU]/mL (ref 0.35–4.50)

## 2019-11-28 LAB — HEPATIC FUNCTION PANEL
ALT: 14 U/L (ref 0–35)
AST: 18 U/L (ref 0–37)
Albumin: 4.4 g/dL (ref 3.5–5.2)
Alkaline Phosphatase: 67 U/L (ref 39–117)
Bilirubin, Direct: 0.1 mg/dL (ref 0.0–0.3)
Total Bilirubin: 0.7 mg/dL (ref 0.2–1.2)
Total Protein: 6.8 g/dL (ref 6.0–8.3)

## 2019-11-28 LAB — CBC WITH DIFFERENTIAL/PLATELET
Basophils Absolute: 0.1 10*3/uL (ref 0.0–0.1)
Basophils Relative: 1 % (ref 0.0–3.0)
Eosinophils Absolute: 0.3 10*3/uL (ref 0.0–0.7)
Eosinophils Relative: 4.9 % (ref 0.0–5.0)
HCT: 42.5 % (ref 36.0–46.0)
Hemoglobin: 14 g/dL (ref 12.0–15.0)
Lymphocytes Relative: 35 % (ref 12.0–46.0)
Lymphs Abs: 2.2 10*3/uL (ref 0.7–4.0)
MCHC: 32.9 g/dL (ref 30.0–36.0)
MCV: 88.2 fl (ref 78.0–100.0)
Monocytes Absolute: 0.4 10*3/uL (ref 0.1–1.0)
Monocytes Relative: 6.9 % (ref 3.0–12.0)
Neutro Abs: 3.2 10*3/uL (ref 1.4–7.7)
Neutrophils Relative %: 52.2 % (ref 43.0–77.0)
Platelets: 227 10*3/uL (ref 150.0–400.0)
RBC: 4.82 Mil/uL (ref 3.87–5.11)
RDW: 14.9 % (ref 11.5–15.5)
WBC: 6.2 10*3/uL (ref 4.0–10.5)

## 2019-11-28 LAB — BASIC METABOLIC PANEL
BUN: 22 mg/dL (ref 6–23)
CO2: 25 mEq/L (ref 19–32)
Calcium: 9.6 mg/dL (ref 8.4–10.5)
Chloride: 105 mEq/L (ref 96–112)
Creatinine, Ser: 0.7 mg/dL (ref 0.40–1.20)
GFR: 82.87 mL/min (ref >60.00)
Glucose, Bld: 85 mg/dL (ref 70–99)
Potassium: 4.9 mEq/L (ref 3.5–5.1)
Sodium: 139 mEq/L (ref 135–145)

## 2019-11-28 MED ORDER — ROSUVASTATIN CALCIUM 10 MG PO TABS
10.0000 mg | ORAL_TABLET | Freq: Every day | ORAL | 1 refills | Status: DC
Start: 2019-11-28 — End: 2021-02-03

## 2019-11-28 MED ORDER — TRAMADOL HCL 50 MG PO TABS: 50.0000 mg | ORAL_TABLET | Freq: Three times a day (TID) | ORAL | 0 refills | Status: AC | PRN

## 2019-11-28 NOTE — Patient Instructions (Addendum)
Follow up in 6 months to recheck cholesterol We'll notify you of your lab results and make any changes if needed Keep up the good work on healthy diet and regular exercise- you can do it! Use the Tramadol as needed for severe pain Call with any questions or concerns Stay Safe!  Stay Healthy!

## 2019-11-28 NOTE — Assessment & Plan Note (Signed)
New to provider, ongoing for pt.  Recovering from R rotator cuff surgery and L shoulder also has issues (tendonitis, bursitis).  She is asking for Tramadol prescription to use PRN.  Rx sent.

## 2019-11-28 NOTE — Assessment & Plan Note (Signed)
Pt has gained 5 lbs since last visit but she has had surgery in the interim and has had limited exercise.  Encouraged healthy diet and regular walking.  Will check labs to risk stratify.

## 2019-11-28 NOTE — Progress Notes (Signed)
   Subjective:    Patient ID: Lorraine [name], female    DOB: 23-Nov-1950, 69 y.o.   MRN: [id number]  HPI Hyperlipidemia- chronic problem, on Crestor 10mg  daily.  Pt stopped medication during her surgical period but restarted 2 months ago.  Denies abd pain, N/V.  No CP, SOB.  Overweight- pt has gained 5 lbs since last visit.  BMI is now 28.46.  Has not been able to exercise regularly due to shoulder surgery.  Has recently resumed walking  Shoulder pain- R sided, had surgery w/ Dr French Ana late April for rotator cuff tear but has since been released.  Asking if she can have PRN Tramadol.   Review of Systems For ROS see HPI   This visit occurred during the SARS-CoV-2 public health emergency.  Safety protocols were in place, including screening questions prior to the visit, additional usage of staff PPE, and extensive cleaning of exam room while observing appropriate contact time as indicated for disinfecting solutions.       Objective:   Physical Exam Vitals reviewed.  Constitutional:      General: She is not in acute distress.    Appearance: Normal appearance. She is well-developed.  HENT:     Head: Normocephalic and atraumatic.  Eyes:     Conjunctiva/sclera: Conjunctivae normal.     Pupils: Pupils are equal, round, and reactive to light.  Neck:     Thyroid: No thyromegaly.  Cardiovascular:     Rate and Rhythm: Normal rate and regular rhythm.     Heart sounds: Normal heart sounds. No murmur heard.   Pulmonary:     Effort: Pulmonary effort is normal. No respiratory distress.     Breath sounds: Normal breath sounds.  Abdominal:     General: There is no distension.     Palpations: Abdomen is soft.     Tenderness: There is no abdominal tenderness.  Musculoskeletal:     Cervical back: Normal range of motion and neck supple.  Lymphadenopathy:     Cervical: No cervical adenopathy.  Skin:    General: Skin is warm and dry.  Neurological:     Mental Status: She is alert and  oriented to person, place, and time.  Psychiatric:        Behavior: Behavior normal.           Assessment & Plan:

## 2019-11-28 NOTE — Assessment & Plan Note (Signed)
Chronic problem.  Tolerating Crestor 10mg  w/o difficulty.  Check labs.  Adjust meds prn

## 2019-11-29 ENCOUNTER — Encounter: Payer: Self-pay | Admitting: General Practice

## 2019-12-12 DIAGNOSIS — N898 Other specified noninflammatory disorders of vagina: Secondary | ICD-10-CM | POA: Diagnosis not present

## 2019-12-12 DIAGNOSIS — N3010 Interstitial cystitis (chronic) without hematuria: Secondary | ICD-10-CM | POA: Diagnosis not present

## 2019-12-12 DIAGNOSIS — R300 Dysuria: Secondary | ICD-10-CM | POA: Diagnosis not present

## 2019-12-12 DIAGNOSIS — R350 Frequency of micturition: Secondary | ICD-10-CM | POA: Diagnosis not present

## 2019-12-12 DIAGNOSIS — N952 Postmenopausal atrophic vaginitis: Secondary | ICD-10-CM | POA: Diagnosis not present

## 2019-12-12 DIAGNOSIS — N766 Ulceration of vulva: Secondary | ICD-10-CM | POA: Diagnosis not present

## 2019-12-17 DIAGNOSIS — N3010 Interstitial cystitis (chronic) without hematuria: Secondary | ICD-10-CM | POA: Diagnosis not present

## 2019-12-17 DIAGNOSIS — R300 Dysuria: Secondary | ICD-10-CM | POA: Diagnosis not present

## 2019-12-18 DIAGNOSIS — H35363 Drusen (degenerative) of macula, bilateral: Secondary | ICD-10-CM | POA: Diagnosis not present

## 2019-12-19 DIAGNOSIS — B379 Candidiasis, unspecified: Secondary | ICD-10-CM | POA: Insufficient documentation

## 2019-12-21 DIAGNOSIS — N3010 Interstitial cystitis (chronic) without hematuria: Secondary | ICD-10-CM | POA: Diagnosis not present

## 2019-12-21 DIAGNOSIS — R300 Dysuria: Secondary | ICD-10-CM | POA: Diagnosis not present

## 2019-12-26 DIAGNOSIS — N3010 Interstitial cystitis (chronic) without hematuria: Secondary | ICD-10-CM | POA: Diagnosis not present

## 2019-12-31 ENCOUNTER — Other Ambulatory Visit: Payer: Self-pay

## 2019-12-31 ENCOUNTER — Ambulatory Visit (INDEPENDENT_AMBULATORY_CARE_PROVIDER_SITE_OTHER): Payer: Medicare Other | Admitting: Internal Medicine

## 2019-12-31 ENCOUNTER — Encounter: Payer: Self-pay | Admitting: Internal Medicine

## 2019-12-31 VITALS — BP 147/75 | HR 57 | Ht 65.0 in | Wt 175.0 lb

## 2019-12-31 DIAGNOSIS — R55 Syncope and collapse: Secondary | ICD-10-CM

## 2019-12-31 DIAGNOSIS — I493 Ventricular premature depolarization: Secondary | ICD-10-CM | POA: Diagnosis not present

## 2019-12-31 DIAGNOSIS — I351 Nonrheumatic aortic (valve) insufficiency: Secondary | ICD-10-CM | POA: Diagnosis not present

## 2019-12-31 DIAGNOSIS — I480 Paroxysmal atrial fibrillation: Secondary | ICD-10-CM | POA: Diagnosis not present

## 2019-12-31 DIAGNOSIS — N3010 Interstitial cystitis (chronic) without hematuria: Secondary | ICD-10-CM | POA: Diagnosis not present

## 2019-12-31 DIAGNOSIS — R0602 Shortness of breath: Secondary | ICD-10-CM | POA: Diagnosis not present

## 2019-12-31 MED ORDER — RIVAROXABAN 20 MG PO TABS
20.0000 mg | ORAL_TABLET | Freq: Every day | ORAL | 11 refills | Status: DC
Start: 2019-12-31 — End: 2020-11-13

## 2019-12-31 NOTE — Progress Notes (Signed)
OFFICE NOTE  Chief Complaint:  Shortness of breath  Primary Care Physician: [name], MD  HPI:  Lorraine [name] is a pleasant 69 year old female who works as a Freight forwarder at [employer].  She was referred to [place] for evaluation of missed or skipped beats. She does not report any awareness of palpitations, however she notices that she occasionally skips beats. This actually may occur on a daily basis. Has been present for many years, however recently was brought to her attention by her primary care doctor. She is actually fairly active, and belongs to the [employer]. She is often swimming or doing land exercises, and does not have any significant limitation to exercise. She denies any chest pain or worsening shortness of breath. Recently she was trying to lose weight and was prescribed phentermine. This did cause marked increase in her palpitations and she discontinued the medicine secondary to that.  07/04/2019  Lorraine [name] is seen today for preoperative clearance.  She is considered a new patient as I last saw her in 2014 for PVCs.  In January 2020 she underwent surgery and postoperatively was found to have A. fib with RVR.  She was taken to [facility] and ultimately converted.  Since then she has been followed in the A. fib clinic by Adline Peals, PA-C.  He did a thorough work-up including an echo and a Myoview stress test earlier last year.  The stress test was negative for ischemia the echo showed normal LV function however she did have mild aortic insufficiency which was noted on exam.  Symptomatically she is well.  She denies chest pain or worsening shortness of breath however notes significant shoulder pain for which she has been evaluated for surgery.  12/31/2019  Lorraine [name] returns today for follow-up.  She reports a slow recovery after recent shoulder surgery.  In fact she says since then she has been short of breath with minimal exertion.  She says it seems to be  getting worse.  She started doing more exercise but it does not seem to be improving.  She denies any symptoms of atrial fibrillation, in fact questions whether she actually had it although we did have evidence of it.  She has been compliant with her Xarelto.  Labs as of September 15 showed no evidence of anemia with an H&H of 14 and 42.  EKG today shows no atrial fibrillation rather a sinus bradycardia with frequent PVCs.  She says she is unaware of the PVCs.  She does note when she exercises her heart rate elevates.  At times she notes tachypalpitations which can occur with exertion or at rest.  This could be atrial fibrillation.  She has not taken her metoprolol regularly due to bradycardia and therefore is at risk for breakthrough A. fib with RVR.   PMHx:  Past Medical History:  Diagnosis Date  . Anxiety   . Aortic atherosclerosis ([place]) 02/03/2018   Noted on CT Abd/Pelvis  . AR (aortic regurgitation) 07/18/2017   Mild, noted on ECHO  . Bilateral cataracts   . Bilateral plantar fasciitis   . Chronic knee pain   . Chronic low back pain   . Chronic lumbar radiculopathy 07/14/2017   Mild, L5, noted on electromyography  . History of palpitations    2014-- low risk exercise tolerence test, no ischemi, PVC's (09-06-2012)  . HSV-1 (herpes simplex virus 1) infection    Cold sore  . Hx of colonic polyps   . Hyperlipidemia   .  Insomnia   . Lactose intolerance   . Left nephrolithiasis 02/03/2018   Nonobstructing, noted on CT Abd/pelvis  . Morton's metatarsalgia, neuralgia, or neuroma, bilateral   . Osteoarthritis    knees and lumbar, feet  . Osteopenia   . Peripheral neuropathy    bilateral feet -- burning and stinging  . PONV (postoperative nausea and vomiting)    severe  . PVC's (premature ventricular contractions)   . Systolic murmur    "Slight"  . Vaginal cyst   . Wears glasses     Past Surgical History:  Procedure Laterality Date  . ANTERIOR CERVICAL DECOMP/DISCECTOMY FUSION   01/2014   C4 -- C6  . CATARACT EXTRACTION W/ INTRAOCULAR LENS  IMPLANT, BILATERAL  2015  . COLONOSCOPY  05/14/2015  . CYSTO WITH HYDRODISTENSION N/A 04/14/2018   Procedure: CYSTOSCOPY/HYDRODISTENSION WITH INSTILLATION OF PYRIDIUM AND MARCAINE, BLADDER BIOPSY WITH FULGERATION 0.5 TO 2 CM;  Surgeon: [name], MD;  Location: [facility];  Service: Urology;  Laterality: N/A;  . ELBOW DEBRIDEMENT Right 09/10/2010   and tendon debridement and radial tunnel release  . EXCISION MORTON'S NEUROMA Bilateral left 02-20-2009/  right & left 05-08-2009  . EXCISION VAGINAL CYST N/A 07/01/2016   Procedure: EXCISION VAGINAL CYST;  Surgeon: Tyson Dense, MD;  Location[facility];  Service: Gynecology;  Laterality: N/A;  . PLANTAR FASCIA RELEASE Bilateral left 04-02-2010/  right 04-02-2016  . TONSILLECTOMY AND ADENOIDECTOMY  child  . TOTAL ABDOMINAL HYSTERECTOMY  1974   w/  Left salpingoophorectomy and Partial right salpingoophorectomy    FAMHx:  Family History  Problem Relation Age of Onset  . Lung cancer Mother        lung  . Heart attack Father   . Memory loss Father   . Cancer Paternal Grandfather        colon  . Multiple sclerosis Brother    No history of palpitations in the family.  SOCHx:   reports that she has never smoked. She has never used smokeless tobacco. She reports current alcohol use. She reports that she does not use drugs.  ALLERGIES:  Allergies  Allergen Reactions  . Amitriptyline     Bladder retention  . Codeine Nausea And Vomiting  . Nortriptyline     Bladder retention     ROS: Pertinent items noted in HPI and remainder of comprehensive ROS otherwise negative.  HOME MEDS: Current Outpatient Medications  Medication Sig Dispense Refill  . ALPRAZolam (XANAX) 0.5 MG tablet Take 1 tablet (0.5 mg total) by mouth 2 (two) times daily as needed for anxiety. 60 tablet 1  . meloxicam (MOBIC) 15 MG tablet TAKE 1 TABLET BY MOUTH  EVERY DAY 30 tablet 1  . metoprolol tartrate (LOPRESSOR) 25 MG tablet TAKE 1/2-1 TABLET BY MOUTH EVERY 6-8HOURS FOR BREAKTHROUGH AFIB FOR HEART RATES OVER 100 45 tablet 3  . polyvinyl alcohol (LIQUIFILM TEARS) 1.4 % ophthalmic solution Place 1 drop into both eyes as needed for dry eyes.    . rivaroxaban (XARELTO) 20 MG TABS tablet Take 1 tablet (20 mg total) by mouth daily with supper. 30 tablet 6  . rosuvastatin (CRESTOR) 10 MG tablet Take 1 tablet (10 mg total) by mouth daily. 90 tablet 1  . tamoxifen (NOLVADEX) 20 MG tablet Take 0.5 tablets (10 mg total) by mouth daily. (Patient taking differently: Take 5 mg by mouth daily. Patient cuts her 20mg  tablets into 4's and takes only 5mg  daily.) 90 tablet 0  . valACYclovir (VALTREX)  1000 MG tablet TAKE 1 TABLET BY MOUTH EVERY DAY (Patient taking differently: as needed. ) 30 tablet 5  . zolpidem (AMBIEN) 10 MG tablet Take 1 tablet (10 mg total) by mouth at bedtime. 30 tablet 3   No current facility-administered medications for this visit.    LABS/IMAGING: No results found for this or any previous visit (from the past 48 hour(s)). No results found.  VITALS: BP (!) 147/75   Pulse (!) 57   Ht 5\' 5"  (1.651 m)   Wt 175 lb (79.4 kg)   SpO2 100%   BMI 29.12 kg/m   EXAM: General appearance: alert and no distress Neck: no carotid bruit, no JVD and thyroid not enlarged, symmetric, no tenderness/mass/nodules Lungs: clear to auscultation bilaterally Heart: regular rate and rhythm, S1, S2 normal and diastolic murmur: early diastolic 2/6, blowing at lower left sternal border Abdomen: soft, non-tender; bowel sounds normal; no masses,  no organomegaly Extremities: extremities normal, atraumatic, no cyanosis or edema Pulses: 2+ and symmetric Skin: Skin color, texture, turgor normal. No rashes or lesions Neurologic: Grossly normal  EKG: Sinus bradycardia with frequent PVCs at 57 -personally reviewed  ASSESSMENT: 1. Progressive dyspnea on  exertion 2. Paroxysmal atrial fibrillation-CHA2DS2-VASc score of 2, on Xarelto 3. Frequent PVCs 4. Dyslipidemia 5. Aortic insufficiency 6. Acceptable risk for upcoming surgery  PLAN: 1.   Lorraine [name] had successful shoulder surgery however afterwards she has felt fatigued and very slow to recover.  Actually has been close to 6 months and she has been exercising regularly but not getting any stronger.  She has been more short of breath and notes intermittent episodes of tachypalpitations.  She is not noted to be in A. fib today however she has frequent PVCs.  This is concerning for increased risk for PVC related cardiomyopathy.  I would like to get an echo to further evaluate her shortness of breath.  Ultimately she may need further monitoring to assess her burden of PVCs or A. fib and possibly an EP consult since she will not take routine beta-blocker due to bradycardia but clearly is having episodes of tachypalpitations suggestive of tachybradycardia syndrome.  She also has a history of aortic insufficiency which was mild by echo in 2019 and is also in need of reassessment.  Follow-up with me in 1 to 2 months.  [name], MD, [facility], [place] Director of the Advanced Lipid Disorders &  Cardiovascular Risk Reduction Clinic Diplomate of the American Board of Clinical Lipidology Attending Cardiologist  Direct Dial: [phone]  Fax: [phone]  Website:  [email or web address] 12/31/2019, 9:23 AM

## 2019-12-31 NOTE — Patient Instructions (Signed)
Medication Instructions:  Your physician recommends that you continue on your current medications as directed. Please refer to the Current Medication list given to you today.  *If you need a refill on your cardiac medications before your next appointment, please call your pharmacy*  Testing/Procedures: Your physician has requested that you have an echocardiogram. Echocardiography is a painless test that uses sound waves to create images of your heart. It provides your doctor with information about the size and shape of your heart and how well your heart's chambers and valves are working. This procedure takes approximately one hour. There are no restrictions for this procedure. -- [address]  Follow-Up: At [facility], you and your health needs are our priority.  As part of our continuing mission to provide you with exceptional heart care, we have created designated Provider Care Teams.  These Care Teams include your primary Cardiologist (physician) and Advanced Practice Providers (APPs -  Physician Assistants and Nurse Practitioners) who all work together to provide you with the care you need, when you need it.  We recommend signing up for the patient portal called "MyChart".  Sign up information is provided on this After Visit Summary.  MyChart is used to connect with patients for Virtual Visits (Telemedicine).  Patients are able to view lab/test results, encounter notes, upcoming appointments, etc.  Non-urgent messages can be sent to your provider as well.   To learn more about what you can do with MyChart, go to [email or web address].    Your next appointment:   1-2 months (after echo)  The format for your next appointment:   In Person  Provider:   You may see [name], MD or one of the following Advanced Practice Providers on your designated Care Team:    Almyra Deforest, PA-C  Fabian Sharp, PA-C or   Roby Lofts, [place]    Other Instructions

## 2020-01-04 DIAGNOSIS — N3010 Interstitial cystitis (chronic) without hematuria: Secondary | ICD-10-CM | POA: Diagnosis not present

## 2020-01-09 DIAGNOSIS — N3010 Interstitial cystitis (chronic) without hematuria: Secondary | ICD-10-CM | POA: Diagnosis not present

## 2020-01-09 DIAGNOSIS — N898 Other specified noninflammatory disorders of vagina: Secondary | ICD-10-CM | POA: Diagnosis not present

## 2020-01-09 DIAGNOSIS — N760 Acute vaginitis: Secondary | ICD-10-CM | POA: Diagnosis not present

## 2020-01-18 ENCOUNTER — Ambulatory Visit (HOSPITAL_COMMUNITY): Payer: Medicare Other | Attending: Cardiology

## 2020-01-18 ENCOUNTER — Other Ambulatory Visit: Payer: Self-pay

## 2020-01-18 DIAGNOSIS — R0602 Shortness of breath: Secondary | ICD-10-CM | POA: Diagnosis not present

## 2020-01-18 DIAGNOSIS — I493 Ventricular premature depolarization: Secondary | ICD-10-CM | POA: Insufficient documentation

## 2020-01-18 DIAGNOSIS — R55 Syncope and collapse: Secondary | ICD-10-CM | POA: Diagnosis not present

## 2020-01-18 DIAGNOSIS — I351 Nonrheumatic aortic (valve) insufficiency: Secondary | ICD-10-CM | POA: Insufficient documentation

## 2020-01-18 LAB — ECHOCARDIOGRAM COMPLETE
Area-P 1/2: 3.21 cm2
P 1/2 time: 539 msec
S' Lateral: 2.8 cm

## 2020-01-23 DIAGNOSIS — M25561 Pain in right knee: Secondary | ICD-10-CM | POA: Diagnosis not present

## 2020-01-23 DIAGNOSIS — M545 Low back pain, unspecified: Secondary | ICD-10-CM | POA: Diagnosis not present

## 2020-01-23 DIAGNOSIS — M25562 Pain in left knee: Secondary | ICD-10-CM | POA: Diagnosis not present

## 2020-02-12 ENCOUNTER — Other Ambulatory Visit: Payer: Self-pay

## 2020-02-12 ENCOUNTER — Encounter: Payer: Self-pay | Admitting: Family Medicine

## 2020-02-12 ENCOUNTER — Telehealth: Payer: Self-pay | Admitting: Internal Medicine

## 2020-02-12 MED ORDER — ZOLPIDEM TARTRATE 10 MG PO TABS
10.0000 mg | ORAL_TABLET | Freq: Every day | ORAL | 3 refills | Status: DC
Start: 2020-02-12 — End: 2020-07-07

## 2020-02-12 MED ORDER — ALPRAZOLAM 0.5 MG PO TABS: 0.5000 mg | ORAL_TABLET | Freq: Two times a day (BID) | ORAL | 1 refills | Status: DC | PRN

## 2020-02-12 MED ORDER — MELOXICAM 15 MG PO TABS
15.0000 mg | ORAL_TABLET | Freq: Every day | ORAL | 1 refills | Status: DC
Start: 2020-02-12 — End: 2020-05-28

## 2020-02-12 NOTE — Telephone Encounter (Signed)
Called to let pt know that we would be leaving samples of Xarelto 20 mg for her at our check-in desk. Pt verbalizes understanding and gratitude for our help.

## 2020-02-12 NOTE — Telephone Encounter (Signed)
    Patient calling the office for samples of medication:   1.  What medication and dosage are you requesting samples for?   rivaroxaban (XARELTO) 20 MG TABS tablet    2.  Are you currently out of this medication? Yes  Pt said she is on a donut hole and need 30 days samples of her xarelto

## 2020-02-12 NOTE — Telephone Encounter (Signed)
WOULD LIKE TO GET REFILLS ON THE FOLLOWING MEDS BEFORE THE HOLIDAYS GET GOING. MELOXICAM 15 MG  ALPRAZOLAM 0.5 MG ZOLPIDEM TARTRATE 10 MG  THANK YOU HAVE A WONDERFUL DAY Bhavika Ofarrell LFD for Meloxicam was 10/08/19 #30 with 1 refill LFD for alprazolam was 11/01/19 #60 with 1 refill LFD for zolpidem was 11/01/19 #30 with 3 refills LOV 11/28/19 NOV 05/28/20

## 2020-02-15 DIAGNOSIS — Z23 Encounter for immunization: Secondary | ICD-10-CM | POA: Diagnosis not present

## 2020-02-20 ENCOUNTER — Ambulatory Visit: Payer: Medicare Other | Admitting: Internal Medicine

## 2020-02-29 ENCOUNTER — Other Ambulatory Visit: Payer: Self-pay

## 2020-02-29 ENCOUNTER — Other Ambulatory Visit: Payer: Self-pay | Admitting: Hematology and Oncology

## 2020-02-29 ENCOUNTER — Ambulatory Visit
Admission: RE | Admit: 2020-02-29 | Discharge: 2020-02-29 | Disposition: A | Discharge status: Home / Self Care | Payer: Medicare Other | Source: Ambulatory Visit | Attending: Hematology and Oncology | Admitting: Hematology and Oncology

## 2020-02-29 DIAGNOSIS — R921 Mammographic calcification found on diagnostic imaging of breast: Secondary | ICD-10-CM | POA: Diagnosis not present

## 2020-02-29 DIAGNOSIS — C50312 Malignant neoplasm of lower-inner quadrant of left female breast: Secondary | ICD-10-CM

## 2020-03-04 NOTE — Progress Notes (Signed)
Patient Care Team: [name], MD as PCP - General (Family Medicine) [name], MD as PCP - Cardiology (Cardiology) [name], MD as Consulting Physician (Obstetrics and Gynecology) [name], [place] as Consulting Physician (Podiatry) [name], MD as Consulting Physician (Rheumatology) [name], MD as Attending Physician (Orthopedic Surgery) [name], MD as Consulting Physician (Cardiology)  DIAGNOSIS:    ICD-10-CM   1. Malignant neoplasm of lower-inner quadrant of left breast in female, estrogen receptor positive ([place])  C50.312    Z17.0     SUMMARY OF ONCOLOGIC HISTORY: Oncology History  Malignant neoplasm of lower-inner quadrant of left breast in female, estrogen receptor positive ([place])  07/26/2018 Initial Diagnosis   Screening mammogram detected a 10cm span of calcifications in the left breast, biopsy confirmed intermediate grade DCIS, ER 100%, PR 100%.    07/26/2018 Cancer Staging   Staging form: Breast, AJCC 8th Edition - Clinical: Stage 0 (cTis (DCIS), cN0, cM0, ER+, PR+) - Signed by Gardenia Phlegm, NP on 07/26/2018   03/20/2019 -  Anti-estrogen oral therapy   Tamoxifen, 5mg  daily     CHIEF COMPLIANT: Follow-up of left breast DCIS on comet clinical trial  INTERVAL HISTORY: Lorraine [name] is a 69 y.o. with above-mentioned history of left breast DCIS currently on the COMET clinical trial, randomized to the active surveillance arm. Sheis currently ontamoxifen 5mg  daily.Mammogram on 02/29/20 showed stable appearance of left breast calcifications and no new suspcious findings. She presentsto the clinictoday for follow-up.  She could not tolerate tamoxifen and she discontinued it in August 2021 because of frequent vaginal infections.  She is doing much better.  She decided she does not want to take it anymore.  ALLERGIES:  is allergic to amitriptyline, codeine, and nortriptyline.  MEDICATIONS:  Current Outpatient  Medications  Medication Sig Dispense Refill  . ALPRAZolam (XANAX) 0.5 MG tablet Take 1 tablet (0.5 mg total) by mouth 2 (two) times daily as needed for anxiety. 60 tablet 1  . meloxicam (MOBIC) 15 MG tablet Take 1 tablet (15 mg total) by mouth daily. 30 tablet 1  . metoprolol tartrate (LOPRESSOR) 25 MG tablet TAKE 1/2-1 TABLET BY MOUTH EVERY 6-8HOURS FOR BREAKTHROUGH AFIB FOR HEART RATES OVER 100 45 tablet 3  . polyvinyl alcohol (LIQUIFILM TEARS) 1.4 % ophthalmic solution Place 1 drop into both eyes as needed for dry eyes.    . rivaroxaban (XARELTO) 20 MG TABS tablet Take 1 tablet (20 mg total) by mouth daily with supper. 30 tablet 11  . rosuvastatin (CRESTOR) 10 MG tablet Take 1 tablet (10 mg total) by mouth daily. 90 tablet 1  . tamoxifen (NOLVADEX) 20 MG tablet Take 0.5 tablets (10 mg total) by mouth daily. (Patient taking differently: Take 5 mg by mouth daily. Patient cuts her 20mg  tablets into 4's and takes only 5mg  daily.) 90 tablet 0  . valACYclovir (VALTREX) 1000 MG tablet TAKE 1 TABLET BY MOUTH EVERY DAY (Patient taking differently: as needed. ) 30 tablet 5  . zolpidem (AMBIEN) 10 MG tablet Take 1 tablet (10 mg total) by mouth at bedtime. 30 tablet 3   No current facility-administered medications for this visit.    PHYSICAL EXAMINATION: ECOG PERFORMANCE STATUS: 1 - Symptomatic but completely ambulatory  Vitals:   03/05/20 0924  BP: (!) 150/68  Pulse: 60  Resp: 17  Temp: (!) 96.8 F (36 C)  SpO2: 100%   Filed Weights   03/05/20 0924  Weight: 183 lb 9.6 oz (  83.3 kg)    BREAST: No palpable masses or nodules in either right or left breasts. No palpable axillary supraclavicular or infraclavicular adenopathy no breast tenderness or nipple discharge. (exam performed in the presence of a chaperone)  LABORATORY DATA:  I have reviewed the data as listed CMP Latest Ref Rng & Units 11/28/2019 05/21/2019 11/15/2018  Glucose 70 - 99 mg/dL 85 95 91  BUN 6 - 23 mg/dL 22 30(H) 20   Creatinine 0.40 - 1.20 mg/dL 0.70 0.67 0.71  Sodium 135 - 145 mEq/L 139 140 140  Potassium 3.5 - 5.1 mEq/L 4.9 4.1 4.7  Chloride 96 - 112 mEq/L 105 107 106  CO2 19 - 32 mEq/L 25 25 26   Calcium 8.4 - 10.5 mg/dL 9.6 9.7 9.5  Total Protein 6.0 - 8.3 g/dL 6.8 6.8 6.8  Total Bilirubin 0.2 - 1.2 mg/dL 0.7 0.9 0.6  Alkaline Phos 39 - 117 U/L 67 83 80  AST 0 - 37 U/L 18 14 14   ALT 0 - 35 U/L 14 16 16     Lab Results  Component Value Date   WBC 6.2 11/28/2019   HGB 14.0 11/28/2019   HCT 42.5 11/28/2019   MCV 88.2 11/28/2019   PLT 227.0 11/28/2019   NEUTROABS 3.2 11/28/2019    ASSESSMENT & PLAN:  Malignant neoplasm of lower-inner quadrant of left breast in female, estrogen receptor positive ([place]) 07/17/2018:Screening mammogram detected a 10cm span of calcifications in the left breast, biopsy confirmed intermediate grade DCIS, ER 100%, PR 100% Stage 0  Current treatment: 1. COMETclinical trial randomized to active surveillance arm 2.Tamoxifen 20 mg daily started 7/8/2020stopped 11/01/2018. Vaginal dryness ([name] treatment 11/16/2018) restarted tamoxifen 5 mg 03/30/2019  Tamoxifen toxicities: Hot flashes: mild Frequent vaginal infections required multiple courses of creams and antibiotics.  She finally discontinued tamoxifen in August 2021.  Breast cancer surveillance: Mammogram 02/29/2020: Stable appearance of pleomorphic calcifications lower central and lower inner left breast consistent with known DCIS. No new or additional calcifications noted.  shoulder surgery: Took a long time for that to get better  Patient will come back in 6 months with a mammogram and follow-up.    No orders of the defined types were placed in this encounter.  The patient has a good understanding of the overall plan. she agrees with it. she will call with any problems that may develop before the next visit here.  Total time spent: 20 mins including face to face time and time spent for  planning, charting and coordination of care  Nicholas Lose, MD 03/05/2020  I, Cloyde Reams Dorshimer, am acting as scribe for Dr. Nicholas Lose.  I have reviewed the above documentation for accuracy and completeness, and I agree with the above.

## 2020-03-05 ENCOUNTER — Other Ambulatory Visit: Payer: Self-pay

## 2020-03-05 ENCOUNTER — Encounter: Payer: Self-pay | Admitting: *Deleted

## 2020-03-05 ENCOUNTER — Telehealth: Payer: Self-pay | Admitting: Hematology and Oncology

## 2020-03-05 ENCOUNTER — Inpatient Hospital Stay: Payer: Medicare Other | Attending: Hematology and Oncology | Admitting: Hematology and Oncology

## 2020-03-05 DIAGNOSIS — Z170 Estrogen receptor positive status [ER+]: Secondary | ICD-10-CM

## 2020-03-05 DIAGNOSIS — C50312 Malignant neoplasm of lower-inner quadrant of left female breast: Secondary | ICD-10-CM | POA: Diagnosis not present

## 2020-03-05 DIAGNOSIS — N951 Menopausal and female climacteric states: Secondary | ICD-10-CM | POA: Diagnosis not present

## 2020-03-05 DIAGNOSIS — Z006 Encounter for examination for normal comparison and control in clinical research program: Secondary | ICD-10-CM | POA: Diagnosis not present

## 2020-03-05 NOTE — Telephone Encounter (Signed)
Scheduled appts per 12/22 los. Gave pt a print out of AVS.

## 2020-03-05 NOTE — Assessment & Plan Note (Signed)
07/17/2018:Screening mammogram detected a 10cm span of calcifications in the left breast, biopsy confirmed intermediate grade DCIS, ER 100%, PR 100% Stage 0  Current treatment: 1. COMETclinical trial randomized to active surveillance arm 2.Tamoxifen 20 mg daily started 7/8/2020stopped 11/01/2018. Vaginal dryness ([name] treatment 11/16/2018) restarted tamoxifen 5 mg 03/30/2019  Tamoxifen toxicities: Hot flashes: mild Vaginal dryness: much improved  Breast cancer surveillance: Mammogram 02/29/2020: Stable appearance of pleomorphic calcifications lower central and lower inner left breast consistent with known DCIS. No new or additional calcifications noted.  Recent shoulder surgery: She is now 8 weeks from surgery and is undergoing physical therapy.  The cause of the shoulder tear is heavy lifting in her yard.  Patient will come back in 6 months with a mammogram and follow-up.

## 2020-03-05 NOTE — Research (Signed)
03/05/20 at 10:33am COMET: Month 18 Follow-up visit - The pt was into the cancer center this morning for her study visit.  The pt said that she is 8 months out from her shoulder surgery in April 2021, and she has fully recovered.  The pt said that she stopped her tamoxifen on 11/13/19, and she does not want to go on any other endocrine medication to treat her DCIS.  The pt was encouraged to discuss her decision with Dr. Lindi Adie. She denies any hot flashes since stopping her tamoxifen.   H&P:  Dr. Lindi Adie met with the pt and addressed all of her questions/concerns today. Please see MD's notes.   Mammogram:Dr. Lindi Adie reviewed the pt's recent mammogram from 02/29/20.  Next mammogram scheduledfor6/17/2022. Dr. Geralyn Flash note states "stable appearance of pleomorphic calcifications lower central and lower inner left breast consistent with known DCIS.  No new or additional calcifications noted".   Questionnaires:  No questionnaire at this time point.   Study Solicited Adverse Events:Dr. Lindi Adie reviewed the COMET list of solicited Adverse Events with the pt.The pt was taking tamoxifen 5 mg, but she stopped her tamoxifen on 11/13/19 due to a recurrent vaginal infection.   Please see chart below.  Plan: Patient is scheduled to return for her next study visit, month24on 09/08/2020 along with her mammogram.  Patient was thanked today for her time and continued support of study and was encouraged to call Dr. Lindi Adie or myselfwith any questions or concerns she may have.  Adverse Event Log Study/Protocol:COMET Cycle:50Month Follow up- Research nurse met with Dr. Lindi Adie on 03/05/20 to obtain attributions.  Event Grade Attribution Comments  Arthralgia 2 Unrelated Baseline comorbidity of arthritis- pt takes Mobic  High cholesterol 1  Unrelated Baseline comorbidity, on Crestor  Myalgia  1 Unrelated Pt reports mild leg aches related to Crestor  Osteopenia  1 Unrelated Per pt report.  Vaginal  infection 2 Probable  Pt stopped her tamoxifen because of this AE.  Pt said her infection took 2 months to resolve.   Sinus infection 2 Unlikely  Treated with antibiotics/steroids in July 2021.  No other "solicited adverse events" present. Grade 0 AE's: Fever, myalgia, hypertension, nausea, fracture, vaginal dryness, hot flashes, and allergic reaction.  Not evaluated Solicited AE's include: Acute Coronary Syndrome and Ischemia Cerebrovascular  Brion Aliment RN, BSN, CCRP Clinical Research Nurse 03/05/2020 10:42 AM

## 2020-04-17 ENCOUNTER — Telehealth: Payer: Self-pay | Admitting: Family Medicine

## 2020-04-17 NOTE — Telephone Encounter (Signed)
Spoke with patient she stated she work on Mondays and cannot do her AWV. She will check with provider to see if the AWV can be done during her office visit.

## 2020-05-28 ENCOUNTER — Other Ambulatory Visit: Payer: Self-pay

## 2020-05-28 ENCOUNTER — Ambulatory Visit (INDEPENDENT_AMBULATORY_CARE_PROVIDER_SITE_OTHER): Payer: Medicare Other | Admitting: Family Medicine

## 2020-05-28 ENCOUNTER — Encounter: Payer: Self-pay | Admitting: Family Medicine

## 2020-05-28 VITALS — BP 122/80 | HR 55 | Temp 97.4°F | Resp 19 | Ht 65.0 in | Wt 179.8 lb

## 2020-05-28 DIAGNOSIS — Z1159 Encounter for screening for other viral diseases: Secondary | ICD-10-CM | POA: Diagnosis not present

## 2020-05-28 DIAGNOSIS — I480 Paroxysmal atrial fibrillation: Secondary | ICD-10-CM

## 2020-05-28 DIAGNOSIS — E663 Overweight: Secondary | ICD-10-CM | POA: Diagnosis not present

## 2020-05-28 DIAGNOSIS — E785 Hyperlipidemia, unspecified: Secondary | ICD-10-CM | POA: Diagnosis not present

## 2020-05-28 DIAGNOSIS — C50312 Malignant neoplasm of lower-inner quadrant of left female breast: Secondary | ICD-10-CM | POA: Diagnosis not present

## 2020-05-28 DIAGNOSIS — M8580 Other specified disorders of bone density and structure, unspecified site: Secondary | ICD-10-CM

## 2020-05-28 DIAGNOSIS — Z170 Estrogen receptor positive status [ER+]: Secondary | ICD-10-CM

## 2020-05-28 DIAGNOSIS — M1990 Unspecified osteoarthritis, unspecified site: Secondary | ICD-10-CM | POA: Diagnosis not present

## 2020-05-28 LAB — CBC WITH DIFFERENTIAL/PLATELET
Basophils Absolute: 0.1 10*3/uL (ref 0.0–0.1)
Basophils Relative: 1.1 % (ref 0.0–3.0)
Eosinophils Absolute: 0.2 10*3/uL (ref 0.0–0.7)
Eosinophils Relative: 3.1 % (ref 0.0–5.0)
HCT: 45.7 % (ref 36.0–46.0)
Hemoglobin: 15.3 g/dL — ABNORMAL HIGH (ref 12.0–15.0)
Lymphocytes Relative: 31.8 % (ref 12.0–46.0)
Lymphs Abs: 1.9 10*3/uL (ref 0.7–4.0)
MCHC: 33.5 g/dL (ref 30.0–36.0)
MCV: 86.1 fl (ref 78.0–100.0)
Monocytes Absolute: 0.3 10*3/uL (ref 0.1–1.0)
Monocytes Relative: 5.8 % (ref 3.0–12.0)
Neutro Abs: 3.5 10*3/uL (ref 1.4–7.7)
Neutrophils Relative %: 58.2 % (ref 43.0–77.0)
Platelets: 247 10*3/uL (ref 150.0–400.0)
RBC: 5.31 Mil/uL — ABNORMAL HIGH (ref 3.87–5.11)
RDW: 14.4 % (ref 11.5–15.5)
WBC: 6 10*3/uL (ref 4.0–10.5)

## 2020-05-28 LAB — TSH: TSH: 1.48 u[IU]/mL (ref 0.35–4.50)

## 2020-05-28 LAB — BASIC METABOLIC PANEL
BUN: 30 mg/dL — ABNORMAL HIGH (ref 6–23)
CO2: 25 mEq/L (ref 19–32)
Calcium: 9.9 mg/dL (ref 8.4–10.5)
Chloride: 108 mEq/L (ref 96–112)
Creatinine, Ser: 0.72 mg/dL (ref 0.40–1.20)
GFR: 85.01 mL/min (ref >60.00)
Glucose, Bld: 87 mg/dL (ref 70–99)
Potassium: 3.9 mEq/L (ref 3.5–5.1)
Sodium: 141 mEq/L (ref 135–145)

## 2020-05-28 LAB — LIPID PANEL
Cholesterol: 174 mg/dL (ref 0–200)
HDL: 51.5 mg/dL (ref >39.00)
LDL Cholesterol: 107 mg/dL — ABNORMAL HIGH (ref 0–99)
NonHDL: 122.48
Total CHOL/HDL Ratio: 3
Triglycerides: 77 mg/dL (ref 0.0–149.0)
VLDL: 15.4 mg/dL (ref 0.0–40.0)

## 2020-05-28 LAB — HEPATIC FUNCTION PANEL
ALT: 17 U/L (ref 0–35)
AST: 20 U/L (ref 0–37)
Albumin: 4.5 g/dL (ref 3.5–5.2)
Alkaline Phosphatase: 76 U/L (ref 39–117)
Bilirubin, Direct: 0.1 mg/dL (ref 0.0–0.3)
Total Bilirubin: 0.9 mg/dL (ref 0.2–1.2)
Total Protein: 7.6 g/dL (ref 6.0–8.3)

## 2020-05-28 MED ORDER — VALACYCLOVIR HCL 1 G PO TABS: 1000.0000 mg | ORAL_TABLET | Freq: Every day | ORAL | 5 refills | Status: DC

## 2020-05-28 MED ORDER — MELOXICAM 15 MG PO TABS
15.0000 mg | ORAL_TABLET | Freq: Every day | ORAL | 1 refills | Status: DC
Start: 2020-05-28 — End: 2021-06-02

## 2020-05-28 NOTE — Assessment & Plan Note (Signed)
Pt is down 5 lbs since last visit.  She is now exercising multiple times a week and working on eating better.  Will continue to follow.

## 2020-05-28 NOTE — Assessment & Plan Note (Signed)
Chronic problem.  On Xarelto for anticoagulation and metoprolol for rate control.  Currently asymptomatic.  Will follow along.

## 2020-05-28 NOTE — Assessment & Plan Note (Signed)
Refill provided on Meloxicam.  Again cautioned on the risk of increased bleeding w/ concomitant use of Xarelto and NSAIDs.  Pt is aware and knows what to watch for.

## 2020-05-28 NOTE — Assessment & Plan Note (Signed)
Chronic problem.  On Crestor 10mg  daily.  Currently asymptomatic.  Check labs.  Adjust meds prn

## 2020-05-28 NOTE — Patient Instructions (Signed)
Follow up in 6 months to recheck cholesterol We'll notify you of your lab results and make any changes if needed Continue to work on healthy diet and regular exercise- you're doing great! Call with any questions or concerns Stay Safe!  Stay Healthy!

## 2020-05-28 NOTE — Progress Notes (Signed)
   Subjective:    Patient ID: Lorraine [name], female    DOB: 1950/04/22, 70 y.o.   MRN: [id number]  HPI Hyperlipidemia- chronic problem, on Crestor 10mg  daily.  No abd pain, N/V.  Arthritis- chronic problem, needs refill on Meloxicam  Overweight- pt has lost 5 lbs since last visit.  BMI now 29.92.  Pt is going to [place] 3x/week, 'on a good clean diet'.  Afib- paroxysmal.  On Xarelto for anticoag.  On Metoprolol for rate control.  No CP, SOB, HAs, visual changes, edema.  R 2nd toe- tip of toe has area that is tender.  Wearing her work shoes causes pain.  Needs note for work to wear open toed shoes.  Breast cancer- pt is now off anti-estrogen meds.  Following w/ Dr Lindi Adie   Review of Systems For ROS see HPI   This visit occurred during the SARS-CoV-2 public health emergency.  Safety protocols were in place, including screening questions prior to the visit, additional usage of staff PPE, and extensive cleaning of exam room while observing appropriate contact time as indicated for disinfecting solutions.       Objective:   Physical Exam Vitals reviewed.  Constitutional:      General: She is not in acute distress.    Appearance: Normal appearance. She is not ill-appearing.  HENT:     Head: Normocephalic and atraumatic.  Eyes:     Extraocular Movements: Extraocular movements intact.     Conjunctiva/sclera: Conjunctivae normal.     Pupils: Pupils are equal, round, and reactive to light.  Cardiovascular:     Rate and Rhythm: Normal rate and regular rhythm.     Pulses: Normal pulses.     Heart sounds: No murmur (II/VI SEM) heard.   Pulmonary:     Effort: Pulmonary effort is normal. No respiratory distress.     Breath sounds: Normal breath sounds. No wheezing or rales.  Abdominal:     General: Abdomen is flat. There is no distension.     Palpations: Abdomen is soft.     Tenderness: There is no abdominal tenderness.  Musculoskeletal:     Cervical back: Normal range of motion  and neck supple. No tenderness.     Right lower leg: No edema.     Left lower leg: No edema.  Lymphadenopathy:     Cervical: No cervical adenopathy.  Skin:    General: Skin is warm and dry.     Comments: Small fissure at tip of R 2nd toe- no evidence of infxn  Neurological:     General: No focal deficit present.     Mental Status: She is alert and oriented to person, place, and time.  Psychiatric:        Mood and Affect: Mood normal.        Behavior: Behavior normal.        Thought Content: Thought content normal.           Assessment & Plan:

## 2020-05-29 LAB — HEPATITIS C ANTIBODY
Hepatitis C Ab: NONREACTIVE
SIGNAL TO CUT-OFF: 0.01 (ref <1.00)

## 2020-06-17 DIAGNOSIS — H35363 Drusen (degenerative) of macula, bilateral: Secondary | ICD-10-CM | POA: Diagnosis not present

## 2020-06-20 DIAGNOSIS — M7062 Trochanteric bursitis, left hip: Secondary | ICD-10-CM | POA: Diagnosis not present

## 2020-06-25 ENCOUNTER — Encounter: Payer: Self-pay | Admitting: Family Medicine

## 2020-06-26 ENCOUNTER — Other Ambulatory Visit: Payer: Self-pay

## 2020-06-26 NOTE — Telephone Encounter (Signed)
Patients medication request has been sent.

## 2020-06-26 NOTE — Telephone Encounter (Signed)
Patient is requesting a refill of the following medications: Requested Prescriptions   Pending Prescriptions Disp Refills  . zolpidem (AMBIEN) 10 MG tablet 30 tablet 3    Sig: Take 1 tablet (10 mg total) by mouth at bedtime.    Date of patient request: 06/26/2020 Last office visit:05/28/2020 Date of last refill:02/12/2020 Last refill amount: 30 tablets 3 refills  Follow up time period per chart: 12/01/2020

## 2020-06-30 DIAGNOSIS — M7062 Trochanteric bursitis, left hip: Secondary | ICD-10-CM | POA: Diagnosis not present

## 2020-06-30 DIAGNOSIS — M7071 Other bursitis of hip, right hip: Secondary | ICD-10-CM | POA: Diagnosis not present

## 2020-07-03 ENCOUNTER — Other Ambulatory Visit: Payer: Self-pay

## 2020-07-03 ENCOUNTER — Ambulatory Visit: Payer: Medicare Other

## 2020-07-03 ENCOUNTER — Ambulatory Visit (INDEPENDENT_AMBULATORY_CARE_PROVIDER_SITE_OTHER): Payer: Medicare Other | Admitting: Podiatry

## 2020-07-03 ENCOUNTER — Encounter: Payer: Self-pay | Admitting: Podiatry

## 2020-07-03 DIAGNOSIS — L600 Ingrowing nail: Secondary | ICD-10-CM | POA: Diagnosis not present

## 2020-07-03 DIAGNOSIS — M778 Other enthesopathies, not elsewhere classified: Secondary | ICD-10-CM

## 2020-07-03 MED ORDER — NEOMYCIN-POLYMYXIN-HC 1 % OT SOLN: OTIC | 1 refills | Status: DC

## 2020-07-03 NOTE — Patient Instructions (Signed)

## 2020-07-04 ENCOUNTER — Encounter: Payer: Self-pay | Admitting: Family Medicine

## 2020-07-04 ENCOUNTER — Other Ambulatory Visit: Payer: Self-pay | Admitting: Family Medicine

## 2020-07-06 NOTE — Progress Notes (Signed)
Subjective:  Patient ID: Lorraine [name], female    DOB: [birth year],  MRN: [id number] HPI Chief Complaint  Patient presents with  . Toe Pain    2nd toes bilateral - lateral borders - tender especially with enclosed shoes, skin is thickened and has cracked open before (R>L) - needs new note for work to wear open toe shoes    70 y.o. female presents with the above complaint.   ROS: Denies fever chills nausea vomiting muscle aches pains calf pain back pain chest pain shortness of breath.  Past Medical History:  Diagnosis Date  . Anxiety   . Aortic atherosclerosis ([place]) 02/03/2018   Noted on CT Abd/Pelvis  . AR (aortic regurgitation) 07/18/2017   Mild, noted on ECHO  . Bilateral cataracts   . Bilateral plantar fasciitis   . Chronic knee pain   . Chronic low back pain   . Chronic lumbar radiculopathy 07/14/2017   Mild, L5, noted on electromyography  . History of palpitations    2014-- low risk exercise tolerence test, no ischemi, PVC's (09-06-2012)  . HSV-1 (herpes simplex virus 1) infection    Cold sore  . Hx of colonic polyps   . Hyperlipidemia   . Insomnia   . Lactose intolerance   . Left nephrolithiasis 02/03/2018   Nonobstructing, noted on CT Abd/pelvis  . Morton's metatarsalgia, neuralgia, or neuroma, bilateral   . Osteoarthritis    knees and lumbar, feet  . Osteopenia   . Peripheral neuropathy    bilateral feet -- burning and stinging  . PONV (postoperative nausea and vomiting)    severe  . PVC's (premature ventricular contractions)   . Systolic murmur    "Slight"  . Vaginal cyst   . Wears glasses    Past Surgical History:  Procedure Laterality Date  . ANTERIOR CERVICAL DECOMP/DISCECTOMY FUSION  01/2014   C4 -- C6  . CATARACT EXTRACTION W/ INTRAOCULAR LENS  IMPLANT, BILATERAL  2015  . COLONOSCOPY  05/14/2015  . CYSTO WITH HYDRODISTENSION N/A 04/14/2018   Procedure: CYSTOSCOPY/HYDRODISTENSION WITH INSTILLATION OF PYRIDIUM AND MARCAINE, BLADDER BIOPSY WITH  FULGERATION 0.5 TO 2 CM;  Surgeon: [name], MD;  Location: [facility];  Service: Urology;  Laterality: N/A;  . ELBOW DEBRIDEMENT Right 09/10/2010   and tendon debridement and radial tunnel release  . EXCISION MORTON'S NEUROMA Bilateral left 02-20-2009/  right & left 05-08-2009  . EXCISION VAGINAL CYST N/A 07/01/2016   Procedure: EXCISION VAGINAL CYST;  Surgeon: Tyson Dense, MD;  Location: [facility];  Service: Gynecology;  Laterality: N/A;  . PLANTAR FASCIA RELEASE Bilateral left 04-02-2010/  right 04-02-2016  . TONSILLECTOMY AND ADENOIDECTOMY  child  . TOTAL ABDOMINAL HYSTERECTOMY  1974   w/  Left salpingoophorectomy and Partial right salpingoophorectomy    Current Outpatient Medications:  .  NEOMYCIN-POLYMYXIN-HYDROCORTISONE (CORTISPORIN) 1 % SOLN OTIC solution, Apply 1-2 drops to toe BID after soaking, Disp: 10 mL, Rfl: 1 .  ALPRAZolam (XANAX) 0.5 MG tablet, Take 1 tablet (0.5 mg total) by mouth 2 (two) times daily as needed for anxiety., Disp: 60 tablet, Rfl: 1 .  meloxicam (MOBIC) 15 MG tablet, Take 1 tablet (15 mg total) by mouth daily., Disp: 30 tablet, Rfl: 1 .  metoprolol tartrate (LOPRESSOR) 25 MG tablet, TAKE 1/2-1 TABLET BY MOUTH EVERY 6-8HOURS FOR BREAKTHROUGH AFIB FOR HEART RATES OVER 100, Disp: 45 tablet, Rfl: 3 .  polyvinyl alcohol (LIQUIFILM TEARS) 1.4 % ophthalmic solution, Place 1 drop into both eyes as needed  for dry eyes., Disp: , Rfl:  .  rivaroxaban (XARELTO) 20 MG TABS tablet, Take 1 tablet (20 mg total) by mouth daily with supper., Disp: 30 tablet, Rfl: 11 .  rosuvastatin (CRESTOR) 10 MG tablet, Take 1 tablet (10 mg total) by mouth daily., Disp: 90 tablet, Rfl: 1 .  valACYclovir (VALTREX) 1000 MG tablet, Take 1 tablet (1,000 mg total) by mouth daily., Disp: 30 tablet, Rfl: 5 .  zolpidem (AMBIEN) 10 MG tablet, Take 1 tablet (10 mg total) by mouth at bedtime., Disp: 30 tablet, Rfl: 3  Allergies  Allergen Reactions  .  Amitriptyline     Bladder retention  . Codeine Nausea And Vomiting  . Nortriptyline     Bladder retention    Review of Systems Objective:  There were no vitals filed for this visit.  General: Well developed, nourished, in no acute distress, alert and oriented x3   Dermatological: Skin is warm, dry and supple bilateral. Nails x 10 are well maintained; remaining integument appears unremarkable at this time. There are no open sores, no preulcerative lesions, no rash or signs of infection present.  Painful ingrown toenail lateral border second toe right foot.  Vascular: Dorsalis Pedis artery and Posterior Tibial artery pedal pulses are 2/4 bilateral with immedate capillary fill time. Pedal hair growth present. No varicosities and no lower extremity edema present bilateral.   Neruologic: Grossly intact via light touch bilateral. Vibratory intact via tuning fork bilateral. Protective threshold with Semmes Wienstein monofilament intact to all pedal sites bilateral. Patellar and Achilles deep tendon reflexes 2+ bilateral. No Babinski or clonus noted bilateral.   Musculoskeletal: No gross boney pedal deformities bilateral. No pain, crepitus, or limitation noted with foot and ankle range of motion bilateral. Muscular strength 5/5 in all groups tested bilateral.  Gait: Unassisted, Nonantalgic.    Radiographs:  None taken  Assessment & Plan:   Assessment: Paronychia second left lateral border  Plan: Discussed etiology pathology conservative surgical therapies matrixectomy was performed to the lateral border of the second nail plate left foot.  Tolerated procedure well without complications.  She was given both oral and written home-going structure for care and soaking of the toe as well as a prescription for Cortisporin Otic to be applied twice daily after soaking.  Follow-up with her in 2 weeks.  We will see if this alleviated her symptoms if it does then we will do the other toes if  necessary.     Zorianna Taliaferro T. [place], [place]

## 2020-07-07 ENCOUNTER — Telehealth: Payer: Self-pay

## 2020-07-07 MED ORDER — ZOLPIDEM TARTRATE 10 MG PO TABS: 10.0000 mg | ORAL_TABLET | Freq: Every day | ORAL | 3 refills | Status: DC

## 2020-07-07 NOTE — Telephone Encounter (Signed)
Rx is a duplicate

## 2020-07-07 NOTE — Addendum Note (Signed)
Addended by: [name] on: 07/07/2020 12:24 PM   Modules accepted: Orders

## 2020-07-07 NOTE — Telephone Encounter (Signed)
LFD for zolpidem 02/12/20 #30 with 3 refills LOV 05/28/20 NOV 12/01/20

## 2020-07-09 ENCOUNTER — Encounter: Payer: Self-pay | Admitting: Family Medicine

## 2020-07-09 DIAGNOSIS — N819 Female genital prolapse, unspecified: Secondary | ICD-10-CM | POA: Diagnosis not present

## 2020-07-09 DIAGNOSIS — Z01419 Encounter for gynecological examination (general) (routine) without abnormal findings: Secondary | ICD-10-CM | POA: Diagnosis not present

## 2020-07-09 DIAGNOSIS — N958 Other specified menopausal and perimenopausal disorders: Secondary | ICD-10-CM | POA: Diagnosis not present

## 2020-07-09 DIAGNOSIS — M8588 Other specified disorders of bone density and structure, other site: Secondary | ICD-10-CM | POA: Diagnosis not present

## 2020-07-09 DIAGNOSIS — N760 Acute vaginitis: Secondary | ICD-10-CM | POA: Diagnosis not present

## 2020-07-09 DIAGNOSIS — C50112 Malignant neoplasm of central portion of left female breast: Secondary | ICD-10-CM | POA: Diagnosis not present

## 2020-07-09 DIAGNOSIS — N952 Postmenopausal atrophic vaginitis: Secondary | ICD-10-CM | POA: Diagnosis not present

## 2020-07-09 DIAGNOSIS — N898 Other specified noninflammatory disorders of vagina: Secondary | ICD-10-CM | POA: Diagnosis not present

## 2020-07-09 DIAGNOSIS — Z6829 Body mass index (BMI) 29.0-29.9, adult: Secondary | ICD-10-CM | POA: Diagnosis not present

## 2020-07-11 ENCOUNTER — Telehealth: Payer: Self-pay

## 2020-07-11 NOTE — Telephone Encounter (Signed)
A PA has been processed for patient for zolpidem tartrate 10mg . We will be notified by [facility] of the approval/denial within 72hrs.

## 2020-07-12 DIAGNOSIS — M25559 Pain in unspecified hip: Secondary | ICD-10-CM | POA: Diagnosis not present

## 2020-07-15 ENCOUNTER — Ambulatory Visit (INDEPENDENT_AMBULATORY_CARE_PROVIDER_SITE_OTHER): Payer: Medicare Other

## 2020-07-15 ENCOUNTER — Ambulatory Visit (INDEPENDENT_AMBULATORY_CARE_PROVIDER_SITE_OTHER): Payer: Medicare Other | Admitting: Podiatry

## 2020-07-15 ENCOUNTER — Other Ambulatory Visit: Payer: Self-pay

## 2020-07-15 DIAGNOSIS — M778 Other enthesopathies, not elsewhere classified: Secondary | ICD-10-CM | POA: Diagnosis not present

## 2020-07-15 MED ORDER — TRIAMCINOLONE ACETONIDE 40 MG/ML IJ SUSP
40.0000 mg | Freq: Once | INTRAMUSCULAR | Status: AC
  Administered 2020-07-15: 40 mg

## 2020-07-15 NOTE — Progress Notes (Signed)
She presents today for follow-up of her matrixectomy fibular border second toe right foot.  States that is doing really well but unsure if not healed yet.  States that she continues to soak on a regular basis.  States that she still hurts right here she refers to the second metatarsophalangeal joint area.  Objective: Vital signs are stable she is alert oriented x3 pulses remain strong and palpable.  She has pain on palpation of the second metatarsophalangeal joints bilaterally pain on end range of motion.  Second toe of the right foot fibular border does not demonstrate any erythema edema cellulitis drainage odor appears to be healing uneventfully.  Assessment: Capsulitis second metatarsophalangeal joints bilateral chronic pain bilateral foot well-healing surgical toe.  Plan: Discussed etiology pathology and surgical therapies recommend she continue to soak Epson salts and warm water every other day until no tenderness remains in the second toe.  Otherwise I injected around the second metatarsophalangeal joint not injecting into the joint itself with 10 mg Kenalog 5 mg Marcaine point of maximal tenderness bilateral foot.  We will follow-up with her on an as-needed basis.

## 2020-07-16 DIAGNOSIS — M25552 Pain in left hip: Secondary | ICD-10-CM | POA: Diagnosis not present

## 2020-07-17 DIAGNOSIS — M25552 Pain in left hip: Secondary | ICD-10-CM | POA: Diagnosis not present

## 2020-07-23 DIAGNOSIS — M6281 Muscle weakness (generalized): Secondary | ICD-10-CM | POA: Diagnosis not present

## 2020-07-23 DIAGNOSIS — M7062 Trochanteric bursitis, left hip: Secondary | ICD-10-CM | POA: Diagnosis not present

## 2020-07-23 DIAGNOSIS — S76012D Strain of muscle, fascia and tendon of left hip, subsequent encounter: Secondary | ICD-10-CM | POA: Diagnosis not present

## 2020-07-23 DIAGNOSIS — M25552 Pain in left hip: Secondary | ICD-10-CM | POA: Diagnosis not present

## 2020-07-29 DIAGNOSIS — Z20822 Contact with and (suspected) exposure to covid-19: Secondary | ICD-10-CM | POA: Diagnosis not present

## 2020-07-30 ENCOUNTER — Encounter: Payer: Self-pay | Admitting: Family Medicine

## 2020-07-30 ENCOUNTER — Telehealth (INDEPENDENT_AMBULATORY_CARE_PROVIDER_SITE_OTHER): Payer: Medicare Other | Admitting: Family Medicine

## 2020-07-30 VITALS — Wt 175.0 lb

## 2020-07-30 DIAGNOSIS — J329 Chronic sinusitis, unspecified: Secondary | ICD-10-CM | POA: Diagnosis not present

## 2020-07-30 DIAGNOSIS — B9689 Other specified bacterial agents as the cause of diseases classified elsewhere: Secondary | ICD-10-CM | POA: Diagnosis not present

## 2020-07-30 MED ORDER — HYDROCODONE BIT-HOMATROP MBR 5-1.5 MG/5ML PO SOLN: 5.0000 mL | Freq: Three times a day (TID) | ORAL | 0 refills | Status: DC | PRN

## 2020-07-30 MED ORDER — AMOXICILLIN 875 MG PO TABS: 875.0000 mg | ORAL_TABLET | Freq: Two times a day (BID) | ORAL | 0 refills | Status: AC

## 2020-07-30 NOTE — Progress Notes (Signed)
Virtual Visit via Video   I connected with patient on 07/30/20 at  9:30 AM EDT by a video enabled telemedicine application and verified that I am speaking with the correct person using two identifiers.  Location patient: Home Location provider: [place], Office Persons participating in the virtual visit: Patient, Provider, [place] (Sabrina M)  I discussed the limitations of evaluation and management by telemedicine and the availability of in person appointments. The patient expressed understanding and agreed to proceed.  Subjective:   HPI:   URI- sxs started ~1 week ago w/ head congestion.  2-3 days ago developed ear fullness and cough.  Pt has hx of sinus infxn.  + fatigue, HA.  Home COVID test negative.  Also did PCR [facility].  Started Flonase, Mucinex, Cold and Flu, saline rinse.  L side of face has been swollen.  + facial pressure.  No fever.  Was in bed yesterday.  + sick contacts- son (who is feeling better at this point).  ROS:   See pertinent positives and negatives per HPI.  Patient Active Problem List   Diagnosis Date Noted  . Prolapse of female genital organs 07/09/2020  . Chronic pain of both shoulders 11/28/2019  . Eustachian tube dysfunction, bilateral 11/05/2019  . Acquired thrombophilia ([place]) 03/08/2019  . Malignant neoplasm of lower-inner quadrant of left breast in female, estrogen receptor positive ([place]) 07/26/2018  . Anxiety about health 05/31/2018  . Paroxysmal atrial fibrillation ([place]) 05/18/2018  . Overweight (BMI 25.0-29.9) 05/18/2018  . Systolic murmur 00/86/7619  . Shingles 11/23/2016  . Arthritis 05/26/2016  . Insomnia 05/26/2016  . Neuropathy 05/26/2016  . Positive ANA (antinuclear antibody) 05/26/2016  . Hyperlipidemia 05/26/2016  . Neck and shoulder pain 01/26/2014  . Cervicalgia 01/26/2014  . Palpitations 08/31/2012  . METATARSALGIA 05/06/2010  . PLANTAR FASCIITIS, LEFT 05/06/2010  . FOOT PAIN, BILATERAL 05/06/2010     Social History   Tobacco Use  . Smoking status: Never Smoker  . Smokeless tobacco: Never Used  Substance Use Topics  . Alcohol use: Yes    Comment: rare    Current Outpatient Medications:  .  meloxicam (MOBIC) 15 MG tablet, Take 1 tablet (15 mg total) by mouth daily., Disp: 30 tablet, Rfl: 1 .  metoprolol tartrate (LOPRESSOR) 25 MG tablet, TAKE 1/2-1 TABLET BY MOUTH EVERY 6-8HOURS FOR BREAKTHROUGH AFIB FOR HEART RATES OVER 100, Disp: 45 tablet, Rfl: 3 .  polyvinyl alcohol (LIQUIFILM TEARS) 1.4 % ophthalmic solution, Place 1 drop into both eyes as needed for dry eyes., Disp: , Rfl:  .  rivaroxaban (XARELTO) 20 MG TABS tablet, Take 1 tablet (20 mg total) by mouth daily with supper., Disp: 30 tablet, Rfl: 11 .  rosuvastatin (CRESTOR) 10 MG tablet, Take 1 tablet (10 mg total) by mouth daily., Disp: 90 tablet, Rfl: 1 .  valACYclovir (VALTREX) 1000 MG tablet, Take 1 tablet (1,000 mg total) by mouth daily., Disp: 30 tablet, Rfl: 5 .  zolpidem (AMBIEN) 10 MG tablet, Take 1 tablet (10 mg total) by mouth at bedtime., Disp: 30 tablet, Rfl: 3 .  diazepam (VALIUM) 5 MG tablet, Take by mouth., Disp: , Rfl:  .  NEOMYCIN-POLYMYXIN-HYDROCORTISONE (CORTISPORIN) 1 % SOLN OTIC solution, Apply 1-2 drops to toe BID after soaking, Disp: 10 mL, Rfl: 1  Allergies  Allergen Reactions  . Amitriptyline     Bladder retention  . Codeine Nausea And Vomiting  . Nortriptyline     Bladder retention     Objective:   Wt 175  lb (79.4 kg)   BMI 29.12 kg/m  AAOx3, NAD NCAT, EOMI No obvious CN deficits Coloring WNL Pt is able to speak clearly, coherently without shortness of breath or increased work of breathing.  Thought process is linear.  Mood is appropriate.   Assessment and Plan:   Bacterial sinusitis- pt's sxs consistent w/ dx.  Start Amoxicillin as she had diarrhea w/ both Augmentin and Doxy.  Cough syrup prn.  Note sent for work.  Reviewed supportive care and red flags that should prompt return.  Pt  expressed understanding and is in agreement w/ plan.   Annye Asa, MD 07/30/2020

## 2020-07-30 NOTE — Progress Notes (Signed)
I connected with  Lorraine [name] on 07/30/20 by a video enabled telemedicine application and verified that I am speaking with the correct person using two identifiers.   I discussed the limitations of evaluation and management by telemedicine. The patient expressed understanding and agreed to proceed.

## 2020-08-12 DIAGNOSIS — M25552 Pain in left hip: Secondary | ICD-10-CM | POA: Diagnosis not present

## 2020-08-12 DIAGNOSIS — M7062 Trochanteric bursitis, left hip: Secondary | ICD-10-CM | POA: Diagnosis not present

## 2020-08-12 DIAGNOSIS — S76012D Strain of muscle, fascia and tendon of left hip, subsequent encounter: Secondary | ICD-10-CM | POA: Diagnosis not present

## 2020-08-12 DIAGNOSIS — M6281 Muscle weakness (generalized): Secondary | ICD-10-CM | POA: Diagnosis not present

## 2020-08-20 DIAGNOSIS — S76012D Strain of muscle, fascia and tendon of left hip, subsequent encounter: Secondary | ICD-10-CM | POA: Diagnosis not present

## 2020-08-20 DIAGNOSIS — M25552 Pain in left hip: Secondary | ICD-10-CM | POA: Diagnosis not present

## 2020-08-20 DIAGNOSIS — M7062 Trochanteric bursitis, left hip: Secondary | ICD-10-CM | POA: Diagnosis not present

## 2020-08-20 DIAGNOSIS — M6281 Muscle weakness (generalized): Secondary | ICD-10-CM | POA: Diagnosis not present

## 2020-08-21 DIAGNOSIS — M25552 Pain in left hip: Secondary | ICD-10-CM | POA: Diagnosis not present

## 2020-08-28 ENCOUNTER — Telehealth (INDEPENDENT_AMBULATORY_CARE_PROVIDER_SITE_OTHER): Payer: Medicare Other | Admitting: Family Medicine

## 2020-08-28 ENCOUNTER — Other Ambulatory Visit: Payer: Self-pay

## 2020-08-28 DIAGNOSIS — J3489 Other specified disorders of nose and nasal sinuses: Secondary | ICD-10-CM | POA: Diagnosis not present

## 2020-08-28 DIAGNOSIS — R059 Cough, unspecified: Secondary | ICD-10-CM

## 2020-08-28 DIAGNOSIS — M542 Cervicalgia: Secondary | ICD-10-CM

## 2020-08-28 MED ORDER — AZITHROMYCIN 250 MG PO TABS: ORAL_TABLET | ORAL | 0 refills | Status: AC

## 2020-08-28 NOTE — Progress Notes (Signed)
Virtual Visit via Video Note  I connected with [name] on 08/28/20 at 1:22 PM by a video enabled telemedicine application and verified that I am speaking with the correct person using two identifiers.  Patient location: work- in room by self.  My location: office - [place]   I discussed the limitations, risks, security and privacy concerns of performing an evaluation and management service by telephone and the availability of in person appointments. I also discussed with the patient that there may be a patient responsible charge related to this service. The patient expressed understanding and agreed to proceed, consent obtained  Chief complaint:  Chief Complaint  Patient presents with   Sinus Problem    Pt reports some sinus pressure, and Lt ear pressure, facial tenderness, reports persistent cough from sinus infection early part of May, new sxs started yesterday. Denies fever and chills      History of Present Illness: Lorraine [name] is a 70 y.o. female  Treated with amoxicillin for bacterial sinusitis on May 18.  Has experienced diarrhea with Augmentin and doxycycline previously. Got better on abx,  but some persistent cough. Min cough - every 5-10 mins. Min postnasal drip.  Now since yesterday, has felt some tenderness in left cheek, forehead left ear to mid neck. Popping and cracking sensation in both ears, pain below left ear to mid neck. No fever. Some fatigue, but works 12 hours. Min congestion coming on.  Still some cough every 5-10 mins. Similar cough.  No sick contacts or covid exposure.  Has not checked covid test.  Hearing ok. Eating/drinking ok.   Had covid vaccine, and booster in December - both J and J vaccines. Declines mRNA vaccine plans. Off work next 3 days.   Tx: has mucinex, flonase, cough syrup- has at home.    Patient Active Problem List   Diagnosis Date Noted   Prolapse of female genital organs 07/09/2020   Chronic pain of both shoulders  11/28/2019   Eustachian tube dysfunction, bilateral 11/05/2019   Acquired thrombophilia ([place]) 03/08/2019   Malignant neoplasm of lower-inner quadrant of left breast in female, estrogen receptor positive ([place]) 07/26/2018   Anxiety about health 05/31/2018   Paroxysmal atrial fibrillation ([place]) 05/18/2018   Overweight (BMI 25.0-29.9) 73/42/8768   Systolic murmur 11/57/2620   Shingles 11/23/2016   Arthritis 05/26/2016   Insomnia 05/26/2016   Neuropathy 05/26/2016   Positive ANA (antinuclear antibody) 05/26/2016   Hyperlipidemia 05/26/2016   Neck and shoulder pain 01/26/2014   Cervicalgia 01/26/2014   Palpitations 08/31/2012   METATARSALGIA 05/06/2010   PLANTAR FASCIITIS, LEFT 05/06/2010   FOOT PAIN, BILATERAL 05/06/2010   Past Medical History:  Diagnosis Date   Anxiety    Aortic atherosclerosis ([place]) 02/03/2018   Noted on CT Abd/Pelvis   AR (aortic regurgitation) 07/18/2017   Mild, noted on ECHO   Bilateral cataracts    Bilateral plantar fasciitis    Chronic knee pain    Chronic low back pain    Chronic lumbar radiculopathy 07/14/2017   Mild, L5, noted on electromyography   History of palpitations    2014-- low risk exercise tolerence test, no ischemi, PVC's (09-06-2012)   HSV-1 (herpes simplex virus 1) infection    Cold sore   Hx of colonic polyps    Hyperlipidemia    Insomnia    Lactose intolerance    Left nephrolithiasis 02/03/2018   Nonobstructing, noted on CT Abd/pelvis   Morton's metatarsalgia, neuralgia, or neuroma, bilateral    Osteoarthritis  knees and lumbar, feet   Osteopenia    Peripheral neuropathy    bilateral feet -- burning and stinging   PONV (postoperative nausea and vomiting)    severe   PVC's (premature ventricular contractions)    Systolic murmur    "Slight"   Vaginal cyst    Wears glasses    Past Surgical History:  Procedure Laterality Date   ANTERIOR CERVICAL DECOMP/DISCECTOMY FUSION  01/2014   C4 -- C6   CATARACT EXTRACTION W/  INTRAOCULAR LENS  IMPLANT, BILATERAL  2015   COLONOSCOPY  05/14/2015   CYSTO WITH HYDRODISTENSION N/A 04/14/2018   Procedure: CYSTOSCOPY/HYDRODISTENSION WITH INSTILLATION OF PYRIDIUM AND MARCAINE, BLADDER BIOPSY WITH FULGERATION 0.5 TO 2 CM;  Surgeon: [name], MD;  Location: [facility];  Service: Urology;  Laterality: N/A;   ELBOW DEBRIDEMENT Right 09/10/2010   and tendon debridement and radial tunnel release   EXCISION MORTON'S NEUROMA Bilateral left 02-20-2009/  right & left 05-08-2009   EXCISION VAGINAL CYST N/A 07/01/2016   Procedure: EXCISION VAGINAL CYST;  Surgeon: Tyson Dense, MD;  [facility];  Service: Gynecology;  Laterality: N/A;   PLANTAR FASCIA RELEASE Bilateral left 04-02-2010/  right 04-02-2016   TONSILLECTOMY AND ADENOIDECTOMY  child   TOTAL ABDOMINAL HYSTERECTOMY  1974   w/  Left salpingoophorectomy and Partial right salpingoophorectomy   Allergies  Allergen Reactions   Amitriptyline     Bladder retention   Codeine Nausea And Vomiting   Nortriptyline     Bladder retention    Prior to Admission medications   Medication Sig Start Date End Date Taking? Authorizing Provider  diazepam (VALIUM) 5 MG tablet Take by mouth. 07/07/20  Yes [provider]  meloxicam (MOBIC) 15 MG tablet Take 1 tablet (15 mg total) by mouth daily. 05/28/20  Yes [name], MD  metoprolol tartrate (LOPRESSOR) 25 MG tablet TAKE 1/2-1 TABLET BY MOUTH EVERY 6-8HOURS FOR BREAKTHROUGH AFIB FOR HEART RATES OVER 100 09/11/19  Yes Fenton, Clint R, PA  NEOMYCIN-POLYMYXIN-HYDROCORTISONE (CORTISPORIN) 1 % SOLN OTIC solution Apply 1-2 drops to toe BID after soaking 07/03/20  Yes Hyatt, Max T, DPM  polyvinyl alcohol (LIQUIFILM TEARS) 1.4 % ophthalmic solution Place 1 drop into both eyes as needed for dry eyes.   Yes [provider]  rivaroxaban (XARELTO) 20 MG TABS tablet Take 1 tablet (20 mg total) by mouth daily with supper.  12/31/19  Yes Hilty, Nadean Corwin, MD  rosuvastatin (CRESTOR) 10 MG tablet Take 1 tablet (10 mg total) by mouth daily. 11/28/19  Yes [name], MD  valACYclovir (VALTREX) 1000 MG tablet Take 1 tablet (1,000 mg total) by mouth daily. 05/28/20  Yes [name], MD  zolpidem (AMBIEN) 10 MG tablet Take 1 tablet (10 mg total) by mouth at bedtime. 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  Has one son.  Works as a Counsellor for [employer].  Education: some college.    Social Determinants of Health   Financial Resource Strain: Not on file  Food Insecurity: Not on file  Transportation Needs: Not on file  Physical Activity: Not on file  Stress: Not on file  Social Connections: Not on file  Intimate Partner Violence: Not on file    Observations/Objective: There were no vitals filed for this visit. Nontoxic appearance on video, speaking in full sentences, normal responses.  No respiratory distress.  Locates area of discomfort infra-auricular on left down to the mid left anterior neck.  Pain at her left maxillary sinus and left frontal sinus, retroorbital.   Assessment and Plan: Sinus pressure - Plan: azithromycin (ZITHROMAX) 250 MG tablet  Cough - Plan: azithromycin (ZITHROMAX) 250 MG tablet  Neck pain on left side - Plan: azithromycin (ZITHROMAX) 250 MG tablet  Persistent  cough since infection last month.  May be residual postviral cough versus continued drainage from sinuses.  Now with worsening left sinus pressure, possible secondary sinusitis as previous infection was on the right.  May have some referred pain to her ear along with some component of eustachian tube dysfunction.    -With intolerance to antibiotics as above we will prescribe azithromycin.  Potential side effects of antibiotics discussed.    - With new symptoms did recommend COVID testing, either rapid with repeat in 24 to 36 hours or PCR.  Quarantine/isolation precautions discussed if positive.    - Continue symptomatic care with Mucinex, saline nasal spray for nasal congestion, Flonase, and has cough suppression as needed.  Urgent care/ER precautions given with understanding of plan expressed.  Follow Up Instructions:   as above.   I discussed the assessment and treatment plan with the patient. The patient was provided an opportunity to ask questions and all were answered. The patient agreed with the plan and demonstrated an understanding of the instructions.   The patient was advised to call back or seek an in-person evaluation if the symptoms worsen or if the condition fails to improve as anticipated.  I provided 21 minutes of non-face-to-face time during this encounter.   Wendie Agreste, MD

## 2020-08-29 ENCOUNTER — Ambulatory Visit
Admission: RE | Admit: 2020-08-29 | Discharge: 2020-08-29 | Disposition: A | Discharge status: Home / Self Care | Payer: Medicare Other | Source: Ambulatory Visit | Attending: Hematology and Oncology | Admitting: Hematology and Oncology

## 2020-08-29 ENCOUNTER — Other Ambulatory Visit: Payer: Self-pay

## 2020-08-29 DIAGNOSIS — Z20822 Contact with and (suspected) exposure to covid-19: Secondary | ICD-10-CM | POA: Diagnosis not present

## 2020-08-29 DIAGNOSIS — C50312 Malignant neoplasm of lower-inner quadrant of left female breast: Secondary | ICD-10-CM

## 2020-08-29 DIAGNOSIS — R922 Inconclusive mammogram: Secondary | ICD-10-CM | POA: Diagnosis not present

## 2020-08-29 DIAGNOSIS — R921 Mammographic calcification found on diagnostic imaging of breast: Secondary | ICD-10-CM | POA: Diagnosis not present

## 2020-08-29 DIAGNOSIS — D0512 Intraductal carcinoma in situ of left breast: Secondary | ICD-10-CM | POA: Diagnosis not present

## 2020-08-30 ENCOUNTER — Encounter: Payer: Self-pay | Admitting: Family Medicine

## 2020-09-04 ENCOUNTER — Other Ambulatory Visit: Payer: Self-pay | Admitting: Hematology and Oncology

## 2020-09-04 DIAGNOSIS — Z1239 Encounter for other screening for malignant neoplasm of breast: Secondary | ICD-10-CM

## 2020-09-07 NOTE — Progress Notes (Signed)
Patient Care Team: [name], MD as PCP - General (Family Medicine) [name], MD as PCP - Cardiology (Cardiology) [name], MD as Consulting Physician (Obstetrics and Gynecology) [name], [place] as Consulting Physician (Podiatry) [name], MD as Consulting Physician (Rheumatology) [name], MD as Attending Physician (Orthopedic Surgery) [name], MD as Consulting Physician (Cardiology)  DIAGNOSIS:    ICD-10-CM   1. Malignant neoplasm of lower-inner quadrant of left breast in female, estrogen receptor positive ([place])  C50.312    Z17.0       SUMMARY OF ONCOLOGIC HISTORY: Oncology History  Malignant neoplasm of lower-inner quadrant of left breast in female, estrogen receptor positive ([place])  07/26/2018 Initial Diagnosis   Screening mammogram detected a 10cm span of calcifications in the left breast, biopsy confirmed intermediate grade DCIS, ER 100%, PR 100%.    07/26/2018 Cancer Staging   Staging form: Breast, AJCC 8th Edition - Clinical: Stage 0 (cTis (DCIS), cN0, cM0, ER+, PR+) - Signed by Gardenia Phlegm, NP on 07/26/2018    03/20/2019 - 10/2019 Anti-estrogen oral therapy   Tamoxifen, 5mg  daily     CHIEF COMPLIANT: Follow-up of left breast DCIS on comet clinical trial  INTERVAL HISTORY: Lorraine [name] is a 70 y.o. with above-mentioned history of left breast DCIS currently on the COMET clinical trial, randomized to the active surveillance arm. She is currently on tamoxifen 5mg  daily. Mammogram on 03/04/21 showed stable appearance of left breast calcifications and no new suspcious findings. She presents to the clinic today for follow-up.  She is doing a lot better from vaginal dryness and vaginal infections since she stopped tamoxifen.  [name] vaginal laser therapy has helped her significantly.  Denies any lumps or nodules in the breast.  ALLERGIES:  is allergic to amitriptyline, codeine, and  nortriptyline.  MEDICATIONS:  Current Outpatient Medications  Medication Sig Dispense Refill   meloxicam (MOBIC) 15 MG tablet Take 1 tablet (15 mg total) by mouth daily. 30 tablet 1   metoprolol tartrate (LOPRESSOR) 25 MG tablet TAKE 1/2-1 TABLET BY MOUTH EVERY 6-8HOURS FOR BREAKTHROUGH AFIB FOR HEART RATES OVER 100 45 tablet 3   polyvinyl alcohol (LIQUIFILM TEARS) 1.4 % ophthalmic solution Place 1 drop into both eyes as needed for dry eyes.     rivaroxaban (XARELTO) 20 MG TABS tablet Take 1 tablet (20 mg total) by mouth daily with supper. 30 tablet 11   rosuvastatin (CRESTOR) 10 MG tablet Take 1 tablet (10 mg total) by mouth daily. 90 tablet 1   valACYclovir (VALTREX) 1000 MG tablet Take 1 tablet (1,000 mg total) by mouth daily. 30 tablet 5   zolpidem (AMBIEN) 10 MG tablet Take 1 tablet (10 mg total) by mouth at bedtime. 30 tablet 3   No current facility-administered medications for this visit.    PHYSICAL EXAMINATION: ECOG PERFORMANCE STATUS: 1 - Symptomatic but completely ambulatory  Vitals:   09/08/20 0823  Pulse: (!) 58  Resp: 18  Temp: 97.7 F (36.5 C)  SpO2: 99%   Filed Weights   09/08/20 0823  Weight: 179 lb 14.4 oz (81.6 kg)    BREAST: No palpable masses or nodules in either right or left breasts. No palpable axillary supraclavicular or infraclavicular adenopathy no breast tenderness or nipple discharge. (exam performed in the presence of a chaperone)  LABORATORY DATA:  I have reviewed the data as listed CMP Latest Ref Rng & Units 05/28/2020 11/28/2019 05/21/2019  Glucose 70 - 99 mg/dL 87  85 95  BUN 6 - 23 mg/dL 30(H) 22 30(H)  Creatinine 0.40 - 1.20 mg/dL 0.72 0.70 0.67  Sodium 135 - 145 mEq/L 141 139 140  Potassium 3.5 - 5.1 mEq/L 3.9 4.9 4.1  Chloride 96 - 112 mEq/L 108 105 107  CO2 19 - 32 mEq/L 25 25 25   Calcium 8.4 - 10.5 mg/dL 9.9 9.6 9.7  Total Protein 6.0 - 8.3 g/dL 7.6 6.8 6.8  Total Bilirubin 0.2 - 1.2 mg/dL 0.9 0.7 0.9  Alkaline Phos 39 - 117 U/L 76 67  83  AST 0 - 37 U/L 20 18 14   ALT 0 - 35 U/L 17 14 16     Lab Results  Component Value Date   WBC 6.0 05/28/2020   HGB 15.3 (H) 05/28/2020   HCT 45.7 05/28/2020   MCV 86.1 05/28/2020   PLT 247.0 05/28/2020   NEUTROABS 3.5 05/28/2020    ASSESSMENT & PLAN:  Malignant neoplasm of lower-inner quadrant of left breast in female, estrogen receptor positive ([place]) 07/17/2018: Screening mammogram detected a 10cm span of calcifications in the left breast, biopsy confirmed intermediate grade DCIS, ER 100%, PR 100% Stage 0   Current treatment:  1. COMET clinical trial randomized to active surveillance arm 2. Tamoxifen 20 mg daily started 09/20/2018 stopped 11/01/2018.  Vaginal dryness ([name] treatment 11/16/2018) restarted tamoxifen 5 mg 03/30/2019   Tamoxifen toxicities: Hot flashes: mild Frequent vaginal infections required multiple courses of creams and antibiotics.  She finally discontinued tamoxifen in August 2021.   Breast cancer surveillance: Bilateral mammogram  09/01/2020: Stable benign left breast calcifications span 9 cm Next mammogram will be in December 2022 on the left breast. Breast exam 09/08/2020: Benign   shoulder surgery: Took a long time for that to get better   Patient will come back in 6 months with a mammogram and follow-up.    No orders of the defined types were placed in this encounter.  The patient has a good understanding of the overall plan. she agrees with it. she will call with any problems that may develop before the next visit here.  Total time spent: 20 mins including face to face time and time spent for planning, charting and coordination of care  Rulon Eisenmenger, MD, MPH 09/08/2020  I, Thana Ates, am acting as scribe for Dr. Nicholas Lose.  I have reviewed the above documentation for accuracy and completeness, and I agree with the above.

## 2020-09-08 ENCOUNTER — Encounter: Payer: Self-pay | Admitting: *Deleted

## 2020-09-08 ENCOUNTER — Telehealth: Payer: Self-pay | Admitting: Hematology and Oncology

## 2020-09-08 ENCOUNTER — Other Ambulatory Visit: Payer: Self-pay

## 2020-09-08 ENCOUNTER — Inpatient Hospital Stay: Payer: Medicare Other | Attending: Hematology and Oncology | Admitting: Hematology and Oncology

## 2020-09-08 DIAGNOSIS — Z006 Encounter for examination for normal comparison and control in clinical research program: Secondary | ICD-10-CM | POA: Insufficient documentation

## 2020-09-08 DIAGNOSIS — Z170 Estrogen receptor positive status [ER+]: Secondary | ICD-10-CM | POA: Diagnosis not present

## 2020-09-08 DIAGNOSIS — C50312 Malignant neoplasm of lower-inner quadrant of left female breast: Secondary | ICD-10-CM

## 2020-09-08 NOTE — Research (Signed)
AFT - 25: COMPARING AN OPERATION TO MONITORING, WITH OR WITHOUT ENDOCRINE THERAPY (COMET) FOR LOW RISK DCIS: A PHASE III PROSPECTIVE RANDOMIZED TRIAL   COMET: Month 24 Follow-up visit - The pt was into the cancer center this morning for her study visit.  The pt said that she has fully recovered from her shoulder surgery.  She said that she is exercising several times a week.  The pt said that she stopped her tamoxifen on 11/13/19, and she does not want to go on any other endocrine medication to treat her DCIS.  Dr. Gudena encouraged the pt to reconsider her decision regarding tamoxifen due to the size of her DCIS.  The pt said that she was not interested in re-starting tamoxifen at this time.     H&P:  Dr. Gudena met with the pt and addressed all of her questions/concerns today. He completed a history and physical exam (including a breast exam today). Please see MD's notes.    Mammogram: Dr. Gudena reviewed the pt's recent mammogram from 08/29/20.  Next mammogram scheduled for 03/04/2021. Dr. Gudena informed the pt that her calcifications were stable.     Questionnaires:  Month 24 questionnaire completed at this time point.    Study Solicited Adverse Events:  Dr. Gudena reviewed the COMET list of solicited Adverse Events with the pt.  The pt reported a recent sinus infection in May 2022 requiring antibiotics.  She said she was tested for COVID, and her result was negative. Pt specifically denied hot flashes.    Plan: Patient is scheduled to return for her next study visit, month 30 on 03/10/2021 along with her mammogram.  Patient was thanked today for her time and continued support of study and was encouraged to call Dr. Gudena or myself with any questions or concerns she may have.    Adverse Event Log Study/Protocol: COMET Cycle: 24 Month Follow up- Research nurse met with Dr. Gudena on 09/08/20 to obtain attributions. Pt takes no medication to treat her DCIS.    Event Grade Attribution Comments   Arthralgia 2 Unrelated  Baseline comorbidity of arthritis- pt takes Mobic  High cholesterol   0  Baseline comorbidity, on Crestor Pt's March 2022 lipid panel reviewed. Cholesterol WNL.  Osteopenia 1 Unrelated Per pt report.  Sinus infection 2 Unrelated Treated with antibiotics in May 2022.  No other "solicited adverse events" present.  Grade 0 AE's:  Fever, myalgia, hypertension, nausea, fracture, vaginal dryness, hot flashes, high cholesterol and allergic reaction.   Not evaluated Solicited AE's include:  Acute Coronary Syndrome and Ischemia Cerebrovascular  Nikki L. Eldreth RN, BSN, CCRP Clinical Research Nurse Lead 09/08/2020 10:08 AM    

## 2020-09-08 NOTE — Telephone Encounter (Signed)
Scheduled appointment per 06/27 los. Patient is aware.

## 2020-09-08 NOTE — Assessment & Plan Note (Signed)
07/17/2018:Screening mammogram detected a 10cm span of calcifications in the left breast, biopsy confirmed intermediate grade DCIS, ER 100%, PR 100% Stage 0  Current treatment: 1. COMETclinical trial randomized to active surveillance arm 2.Tamoxifen 20 mg daily started 7/8/2020stopped 11/01/2018. Vaginal dryness ([name] treatment 11/16/2018) restarted tamoxifen 5 mg 03/30/2019  Tamoxifen toxicities: Hot flashes: mild Frequent vaginal infections required multiple courses of creams and antibiotics.  She finally discontinued tamoxifen in August 2021.  Breast cancer surveillance: Bilateral mammogram  09/01/2020: Stable benign left breast calcifications span 9 cm Next mammogram will be in December 2022 on the left breast.  shoulder surgery: Took a long time for that to get better  Patient will come back in 6 months with a mammogram and follow-up.

## 2020-09-10 ENCOUNTER — Encounter: Payer: Self-pay | Admitting: *Deleted

## 2020-09-18 ENCOUNTER — Encounter: Payer: Self-pay | Admitting: Podiatry

## 2020-09-18 ENCOUNTER — Other Ambulatory Visit: Payer: Self-pay

## 2020-09-18 ENCOUNTER — Ambulatory Visit (INDEPENDENT_AMBULATORY_CARE_PROVIDER_SITE_OTHER): Payer: Medicare Other

## 2020-09-18 ENCOUNTER — Ambulatory Visit (INDEPENDENT_AMBULATORY_CARE_PROVIDER_SITE_OTHER): Payer: Medicare Other | Admitting: Podiatry

## 2020-09-18 DIAGNOSIS — M778 Other enthesopathies, not elsewhere classified: Secondary | ICD-10-CM

## 2020-09-18 MED ORDER — GABAPENTIN 300 MG PO CAPS: 300.0000 mg | ORAL_CAPSULE | Freq: Every day | ORAL | 3 refills | Status: DC

## 2020-09-18 MED ORDER — TRIAMCINOLONE ACETONIDE 40 MG/ML IJ SUSP
40.0000 mg | Freq: Once | INTRAMUSCULAR | Status: AC
Start: 2020-09-18 — End: 2020-09-18
  Administered 2020-09-18: 40 mg

## 2020-09-19 NOTE — Progress Notes (Signed)
She presents today for follow-up of her right foot pain.  States that she has been having pain in both feet states that she is requesting an injection rates the pain in general a 4 out of 10 and when it is really acting up a 6 out of 10.  He has been taking over-the-counter anti-inflammatories which seems to help.  Objective: Vital signs are stable she is alert and oriented x3.  Pulses are palpable.  She has pain to the distal aspect of the third digit of the right foot.  States that is painful right and here she points to the distal and distal lateral aspect of the toe itself.  This toe is flexible in nature does demonstrate some mild erythema distally but no pain proximally.  She does have numbness and tingling that spreads proximally up the foot but no signs of neuromas.  She does have tenderness on end range of motion of the second metatarsophalangeal joint bilaterally which is the capsulitis that we treated last time but is not nearly as inflamed as it was previously.  Assessment: Flexor contraction third digit right foot with pain.  Capsulitis chronic second metatarsophalangeal joint bilateral.  Probable idiopathic neuropathy across the dorsum of the foot.  Plan: Started her on gabapentin 300 mg 1 p.o. nightly #90 with 3 refills we will follow-up with her in 1 month for this.  I will also in 1 month follow-up with her for her tenotomy on her toe and we will do this late in the morning or late in the day.  Should she have questions or concerns she will notify us immediately notify me with questions regarding the gabapentin if she is feeling ill from this.

## 2020-09-23 ENCOUNTER — Encounter: Payer: Self-pay | Admitting: Family Medicine

## 2020-09-23 ENCOUNTER — Telehealth (INDEPENDENT_AMBULATORY_CARE_PROVIDER_SITE_OTHER): Payer: Medicare Other | Admitting: Family Medicine

## 2020-09-23 VITALS — BP 120/80 | HR 65 | Temp 97.7°F | Wt 177.0 lb

## 2020-09-23 DIAGNOSIS — H9201 Otalgia, right ear: Secondary | ICD-10-CM | POA: Diagnosis not present

## 2020-09-23 NOTE — Progress Notes (Signed)
Virtual Visit via Telephone Note  I connected with Lorraine [name] on 09/23/20 at  5:20 PM EDT by telephone and verified that I am speaking with the correct person using two identifiers.   I discussed the limitations, risks, security and privacy concerns of performing an evaluation and management service by telephone and the availability of in person appointments. I also discussed with the patient that there may be a patient responsible charge related to this service. The patient expressed understanding and agreed to proceed.  Location patient: home, [place] Location provider: work or home office Participants present for the call: patient, provider Patient did not have a visit with me in the prior 7 days to address this/these issue(s).   History of Present Illness:  Acute telemedicine visit for ear issues: -Onset: 2 days -Symptoms include: R ear popping sometimes and some pain, mild cough she has had with chronic issues -reports had som L ear pain last month and zpack cleared it up -Denies: fevers, sinus congestion, catching or locking of jaw, drainage from ear, sinus congestion other than has had some baseline issues, known sick contacts -Has tried:musinex, saline rinse -Pertinent past medical history: see below -Pertinent medication allergies:  Allergies  Allergen Reactions   Amitriptyline     Bladder retention   Codeine Nausea And Vomiting   Nortriptyline     Bladder retention   -COVID-19 vaccine status:J and J and had one booster  Past Medical History:  Diagnosis Date   Anxiety    Aortic atherosclerosis ([place]) 02/03/2018   Noted on CT Abd/Pelvis   AR (aortic regurgitation) 07/18/2017   Mild, noted on ECHO   Bilateral cataracts    Bilateral plantar fasciitis    Chronic knee pain    Chronic low back pain    Chronic lumbar radiculopathy 07/14/2017   Mild, L5, noted on electromyography   History of palpitations    2014-- low risk exercise tolerence test, no ischemi, PVC's  (09-06-2012)   HSV-1 (herpes simplex virus 1) infection    Cold sore   Hx of colonic polyps    Hyperlipidemia    Insomnia    Lactose intolerance    Left nephrolithiasis 02/03/2018   Nonobstructing, noted on CT Abd/pelvis   Morton's metatarsalgia, neuralgia, or neuroma, bilateral    Osteoarthritis    knees and lumbar, feet   Osteopenia    Peripheral neuropathy    bilateral feet -- burning and stinging   PONV (postoperative nausea and vomiting)    severe   PVC's (premature ventricular contractions)    Systolic murmur    "Slight"   Vaginal cyst    Wears glasses      Observations/Objective: Patient sounds cheerful and well on the phone. I do not appreciate any SOB. Speech and thought processing are grossly intact. Patient reported vitals:  Assessment and Plan:  Ear pain, right  -we discussed possible serious and likely etiologies, options for evaluation and workup, limitations of telemedicine visit vs in person visit, treatment, treatment risks and precautions. Pt prefers to treat via telemedicine empirically rather than in person at this moment. Query ETD, TMJ dysfunction vs other. She opted to try nasal saline, flonase, do a covid test.  Advised to seek prompt in person care if worsening, new symptoms arise, or if is not improving with treatment over the next 3-4 days as would need evaluation of the ear. Advised of options for inperson care in case PCP office not available. Did let the patient know that I only do  telemedicine shifts for [facility] on Tuesdays and Thursdays and advised a follow up visit with PCP or at an [facility] if has further questions or concerns.   Follow Up Instructions:  I did not refer this patient for an OV with me in the next 24 hours for this/these issue(s).  I discussed the assessment and treatment plan with the patient. The patient was provided an opportunity to ask questions and all were answered. The patient agreed with the plan and demonstrated an  understanding of the instructions.   I spent 15 minutes on the date of this visit in the care of this patient. See summary of tasks completed to properly care for this patient in the detailed notes above which also included counseling of above, review of PMH, medications, allergies, evaluation of the patient and ordering and/or  instructing patient on testing and care options.     Lucretia Kern, DO

## 2020-09-23 NOTE — Patient Instructions (Signed)
-  nasal saline twice daily  -flonase 2 sprays each nostril daily for 3 weeks  -do a covid test  I hope you are feeling better soon!  Seek in person care promptly if your symptoms worsen, new concerns arise or you are not improving with treatment over the next several days  It was nice to meet you today. I help [facility] out with telemedicine visits on Tuesdays and Thursdays and am available for visits on those days. If you have any concerns or questions following this visit please schedule a follow up visit with your Primary Care doctor or seek care at a local urgent care clinic to avoid delays in care.

## 2020-10-16 ENCOUNTER — Ambulatory Visit (INDEPENDENT_AMBULATORY_CARE_PROVIDER_SITE_OTHER): Payer: Medicare Other | Admitting: Podiatry

## 2020-10-16 ENCOUNTER — Other Ambulatory Visit: Payer: Self-pay

## 2020-10-16 ENCOUNTER — Encounter: Payer: Self-pay | Admitting: Podiatry

## 2020-10-16 DIAGNOSIS — M205X9 Other deformities of toe(s) (acquired), unspecified foot: Secondary | ICD-10-CM

## 2020-10-16 DIAGNOSIS — G5793 Unspecified mononeuropathy of bilateral lower limbs: Secondary | ICD-10-CM | POA: Diagnosis not present

## 2020-10-16 MED ORDER — GABAPENTIN 100 MG PO CAPS: 100.0000 mg | ORAL_CAPSULE | Freq: Every morning | ORAL | 3 refills | Status: DC

## 2020-10-19 NOTE — Progress Notes (Signed)
She presents today for follow-up of her gabapentin.  She states that she still has some burning and tingling but maybe not quite as bad to her right side.  States that 300 mg at gabapentin at nighttime seems to be helping to some degree.  She is also here concerned about her perspective tenotomy to the third digit of the right foot not sure as to whether is going to help or not or possibly make things worse regarding the pain to the distal lateral aspect of the third toe right foot.  She states that she would like to have some relief during the day.  She does bring a report with her today stating that she has L5 issues which would be consistent with the tibialis anterior muscle weakness and gluteus medius muscle weakness.  Objective: Vital signs are stable she is alert and oriented x3 there is no erythema edema cellulitis drainage or odor she has adductovarus rotated hammertoe deformities third fourth and fifth bilateral.  Right appears to be worse than the left.  The toes are flexible in nature.  Assessment: Neuropathy with mild hammertoe deformities.  Plan: Begin to continue the gabapentin 300 mg at nighttime we are also going to add at 100 mg in the morning.  As long as this does not make her too sleepy she will continue the 100 mg in the morning.  Otherwise I will follow-up with her in about 6 weeks or so.

## 2020-10-30 ENCOUNTER — Telehealth: Payer: Self-pay | Admitting: Family Medicine

## 2020-10-30 NOTE — Chronic Care Management (AMB) (Signed)
  Chronic Care Management   Outreach Note  10/30/2020 Name: Lorraine [name] MRN: [id number] DOB: 1951-02-16  Referred by: [name], MD Reason for referral : No chief complaint on file.   An unsuccessful telephone outreach was attempted today. The patient was referred to the pharmacist for assistance with care management and care coordination.   Follow Up Plan:   Tatjana Dellinger Upstream Scheduler

## 2020-11-02 ENCOUNTER — Encounter: Payer: Self-pay | Admitting: Family Medicine

## 2020-11-03 ENCOUNTER — Other Ambulatory Visit: Payer: Self-pay

## 2020-11-03 MED ORDER — ZOLPIDEM TARTRATE 10 MG PO TABS: 10.0000 mg | ORAL_TABLET | Freq: Every day | ORAL | 3 refills | Status: DC

## 2020-11-03 NOTE — Telephone Encounter (Signed)
PLEASE SEND A REFILL TO [facility]  (IT IS LESS EXPENSIVE THERE) FOR ZOLPIDEM TARTRATE 10 MG.    Lorraine [name], Lorraine [name] Porchia  LFD 07/07/20 #30 with 3 refills LOV  08/28/20 NOV 12/01/20

## 2020-11-07 ENCOUNTER — Telehealth: Payer: Self-pay | Admitting: Family Medicine

## 2020-11-07 NOTE — Progress Notes (Signed)
  Chronic Care Management   Note  11/07/2020 Name: Lorraine [name] MRN: [id number] DOB: February 18, 1951  Lorraine [name] is a 70 y.o. year old female who is a primary care patient of Birdie Riddle, Aundra Millet, MD. I reached out to Lorraine [name] by phone today in response to a referral sent by Ms. Heron Nay Loiselle's PCP, [name], MD.   Lorraine [name] was given information about Chronic Care Management services today including:  CCM service includes personalized support from designated clinical staff supervised by her physician, including individualized plan of care and coordination with other care providers 24/7 contact phone numbers for assistance for urgent and routine care needs. Service will only be billed when office clinical staff spend 20 minutes or more in a month to coordinate care. Only one practitioner may furnish and bill the service in a calendar month. The patient may stop CCM services at any time (effective at the end of the month) by phone call to the office staff.   Patient wishes to consider information provided and/or speak with a member of the care team before deciding about enrollment in care management services.   Follow up plan:   Tatjana Secretary/administrator

## 2020-11-12 NOTE — Telephone Encounter (Signed)
Sent message to [facility] for refill and scheduling for appointment

## 2020-11-13 ENCOUNTER — Other Ambulatory Visit: Payer: Self-pay

## 2020-11-13 DIAGNOSIS — M25561 Pain in right knee: Secondary | ICD-10-CM | POA: Diagnosis not present

## 2020-11-13 DIAGNOSIS — M5416 Radiculopathy, lumbar region: Secondary | ICD-10-CM | POA: Diagnosis not present

## 2020-11-13 DIAGNOSIS — M7062 Trochanteric bursitis, left hip: Secondary | ICD-10-CM | POA: Diagnosis not present

## 2020-11-13 MED ORDER — RIVAROXABAN 20 MG PO TABS: 20.0000 mg | ORAL_TABLET | Freq: Every day | ORAL | 0 refills | Status: DC

## 2020-11-13 NOTE — Telephone Encounter (Signed)
Prescription refill request for Xarelto received.  Indication:afib Last office visit:hilty 12/31/19 Weight:81.6kg Age:89fScr:0.72 05/28/20 CrCl: 93.7

## 2020-11-21 DIAGNOSIS — M7062 Trochanteric bursitis, left hip: Secondary | ICD-10-CM | POA: Diagnosis not present

## 2020-11-26 DIAGNOSIS — Z20822 Contact with and (suspected) exposure to covid-19: Secondary | ICD-10-CM | POA: Diagnosis not present

## 2020-11-27 ENCOUNTER — Encounter: Payer: Self-pay | Admitting: Registered Nurse

## 2020-11-27 ENCOUNTER — Other Ambulatory Visit: Payer: Self-pay

## 2020-11-27 ENCOUNTER — Telehealth (INDEPENDENT_AMBULATORY_CARE_PROVIDER_SITE_OTHER): Payer: Medicare Other | Admitting: Registered Nurse

## 2020-11-27 VITALS — BP 140/80 | Temp 98.0°F | Wt 181.0 lb

## 2020-11-27 DIAGNOSIS — J22 Unspecified acute lower respiratory infection: Secondary | ICD-10-CM | POA: Diagnosis not present

## 2020-11-27 MED ORDER — AZITHROMYCIN 250 MG PO TABS: ORAL_TABLET | ORAL | 0 refills | Status: AC

## 2020-11-27 MED ORDER — PREDNISONE 20 MG PO TABS: 20.0000 mg | ORAL_TABLET | Freq: Every day | ORAL | 0 refills | Status: DC

## 2020-11-27 MED ORDER — BENZONATATE 200 MG PO CAPS: 200.0000 mg | ORAL_CAPSULE | Freq: Two times a day (BID) | ORAL | 0 refills | Status: DC | PRN

## 2020-11-27 MED ORDER — HYDROCODONE BIT-HOMATROP MBR 5-1.5 MG/5ML PO SOLN: 5.0000 mL | Freq: Three times a day (TID) | ORAL | 0 refills | Status: DC | PRN

## 2020-11-27 NOTE — Patient Instructions (Signed)
° ° ° °  If you have lab work done today you will be contacted with your lab results within the next 2 weeks.  If you have not heard from us then please contact us. The fastest way to get your results is to register for My Chart. ° ° °IF you received an x-ray today, you will receive an invoice from [facility]. Please contact [facility] at [phone] with questions or concerns regarding your invoice.  ° °IF you received labwork today, you will receive an invoice from [facility]. Please contact [facility] at [phone] with questions or concerns regarding your invoice.  ° °Our billing staff will not be able to assist you with questions regarding bills from these companies. ° °You will be contacted with the lab results as soon as they are available. The fastest way to get your results is to activate your My Chart account. Instructions are located on the last page of this paperwork. If you have not heard from us regarding the results in 2 weeks, please contact this office. °  ° ° ° °

## 2020-11-27 NOTE — Progress Notes (Signed)
Telemedicine Encounter- SOAP NOTE Established Patient  This telephone encounter was conducted with the patient's (or proxy's) verbal consent via audio telecommunications: yes/no: Yes Patient was instructed to have this encounter in a suitably private space; and to only have persons present to whom they give permission to participate. In addition, patient identity was confirmed by use of name plus two identifiers (DOB and address).  I discussed the limitations, risks, security and privacy concerns of performing an evaluation and management service by telephone and the availability of in person appointments. I also discussed with the patient that there may be a patient responsible charge related to this service. The patient expressed understanding and agreed to proceed.  I spent a total of 16 minutes talking with the patient or their proxy.  Patient at home Provider in office  Participants: Kathrin Ruddy, NP and [name]  Chief Complaint  Patient presents with   Cough    Patient states she has been experiencing some cold symptoms slight headache , fatigue, and cough. Patient states she felt bad since Monday and seems to be getting worse. She took a covid test on Yesterday but the results    Subjective   Lorraine [name] is a 70 y.o. established patient. Telephone visit today for cough  HPI Cough  Onset Monday, steadily worsening Cough, congestion, sore throat  Sore throat feels like irritation from coughing. Does not feel like strep  Cough is wet sounding, some mucus coming up with cough, feeling like she can't get mucus out. Feeling like it is starting to go into chest.  Fatigue: ongoing, no sleep disturbance beyond occ coughing.  A week ago today - supervisor had cough - likely contagious.   [facility] test - results due today or tomorrow.  Denies shob, doe, palpitations, chest pain, headache, loc  Patient Active Problem List   Diagnosis Date Noted   Prolapse of  female genital organs 07/09/2020   Chronic pain of both shoulders 11/28/2019   Eustachian tube dysfunction, bilateral 11/05/2019   Acquired thrombophilia ([place]) 03/08/2019   Malignant neoplasm of lower-inner quadrant of left breast in female, estrogen receptor positive ([place]) 07/26/2018   Anxiety about health 05/31/2018   Paroxysmal atrial fibrillation ([place]) 05/18/2018   Overweight (BMI 25.0-29.9) [id number]   Systolic murmur 99991111   Shingles 11/23/2016   Arthritis 05/26/2016   Insomnia 05/26/2016   Neuropathy 05/26/2016   Positive ANA (antinuclear antibody) 05/26/2016   Hyperlipidemia 05/26/2016   Neck and shoulder pain 01/26/2014   Cervicalgia 01/26/2014   Palpitations 08/31/2012   METATARSALGIA 05/06/2010   PLANTAR FASCIITIS, LEFT 05/06/2010   FOOT PAIN, BILATERAL 05/06/2010    Past Medical History:  Diagnosis Date   Anxiety    Aortic atherosclerosis ([place]) 02/03/2018   Noted on CT Abd/Pelvis   AR (aortic regurgitation) 07/18/2017   Mild, noted on ECHO   Bilateral cataracts    Bilateral plantar fasciitis    Chronic knee pain    Chronic low back pain    Chronic lumbar radiculopathy 07/14/2017   Mild, L5, noted on electromyography   History of palpitations    2014-- low risk exercise tolerence test, no ischemi, PVC's (09-06-2012)   HSV-1 (herpes simplex virus 1) infection    Cold sore   Hx of colonic polyps    Hyperlipidemia    Insomnia    Lactose intolerance    Left nephrolithiasis 02/03/2018   Nonobstructing, noted on CT Abd/pelvis   Morton's metatarsalgia, neuralgia, or neuroma, bilateral  Osteoarthritis    knees and lumbar, feet   Osteopenia    Peripheral neuropathy    bilateral feet -- burning and stinging   PONV (postoperative nausea and vomiting)    severe   PVC's (premature ventricular contractions)    Systolic murmur    "Slight"   Vaginal cyst    Wears glasses     Current Outpatient Medications  Medication Sig Dispense Refill   azithromycin  (ZITHROMAX) 250 MG tablet Take 2 tablets on day 1, then 1 tablet daily on days 2 through 5 6 tablet 0   benzonatate (TESSALON) 200 MG capsule Take 1 capsule (200 mg total) by mouth 2 (two) times daily as needed for cough. 20 capsule 0   CALCIUM PO Take by mouth daily.     HYDROcodone bit-homatropine (HYCODAN) 5-1.5 MG/5ML syrup Take 5 mLs by mouth every 8 (eight) hours as needed for cough. 120 mL 0   meloxicam (MOBIC) 15 MG tablet Take 1 tablet (15 mg total) by mouth daily. 30 tablet 1   metoprolol tartrate (LOPRESSOR) 25 MG tablet TAKE 1/2-1 TABLET BY MOUTH EVERY 6-8HOURS FOR BREAKTHROUGH AFIB FOR HEART RATES OVER 100 45 tablet 3   polyvinyl alcohol (LIQUIFILM TEARS) 1.4 % ophthalmic solution Place 1 drop into both eyes as needed for dry eyes.     predniSONE (DELTASONE) 20 MG tablet Take 1 tablet (20 mg total) by mouth daily with breakfast. 8 tablet 0   rivaroxaban (XARELTO) 20 MG TABS tablet Take 1 tablet (20 mg total) by mouth daily with supper. Appointment needed with cardiologist 90 tablet 0   rosuvastatin (CRESTOR) 10 MG tablet Take 1 tablet (10 mg total) by mouth daily. 90 tablet 1   valACYclovir (VALTREX) 1000 MG tablet Take 1 tablet (1,000 mg total) by mouth daily. 30 tablet 5   VITAMIN D PO Take by mouth daily. Vitamin D3 and K2     zolpidem (AMBIEN) 10 MG tablet Take 1 tablet (10 mg total) by mouth at bedtime. 30 tablet 3   gabapentin (NEURONTIN) 100 MG capsule Take 1 capsule (100 mg total) by mouth every morning. 90 capsule 3   gabapentin (NEURONTIN) 300 MG capsule Take 1 capsule (300 mg total) by mouth at bedtime. 90 capsule 3   No current facility-administered medications for this visit.    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  Has one son.  Works as a Counsellor for [employer].  Education: some college.    Social Determinants of Health   Financial Resource Strain: Not on file  Food Insecurity: Not on file  Transportation Needs: Not on  file  Physical Activity: Not on file  Stress: Not on file  Social Connections: Not on file  Intimate Partner Violence: Not on file    Review of Systems  Constitutional:  Positive for malaise/fatigue. Negative for chills, diaphoresis, fever and weight loss.  HENT:  Positive for sinus pain. Negative for congestion, ear discharge, ear pain, hearing loss, nosebleeds, sore throat and tinnitus.   Eyes: Negative.   Respiratory:  Positive for cough and shortness of breath. Negative for hemoptysis, sputum production, wheezing and stridor.   Cardiovascular: Negative.   Gastrointestinal: Negative.   Genitourinary: Negative.   Musculoskeletal: Negative.   Skin: Negative.   Neurological: Negative.   Endo/Heme/Allergies: Negative.   Psychiatric/Behavioral: Negative.    All other systems reviewed and are negative.  Objective   Vitals as reported by the patient: Today's Vitals   11/27/20 1259  BP: 140/80  Temp: 98 F (36.7 C)  Weight: 181 lb (82.1 kg)    Niala was seen today for cough.  Diagnoses and all orders for this visit:  Lower respiratory infection -     HYDROcodone bit-homatropine (HYCODAN) 5-1.5 MG/5ML syrup; Take 5 mLs by mouth every 8 (eight) hours as needed for cough. -     benzonatate (TESSALON)  200 MG capsule; Take 1 capsule (200 mg total) by mouth 2 (two) times daily as needed for cough. -     azithromycin (ZITHROMAX) 250 MG tablet; Take 2 tablets on day 1, then 1 tablet daily on days 2 through 5 -     predniSONE (DELTASONE) 20 MG tablet; Take 1 tablet (20 mg total) by mouth daily with breakfast.   PLAN Concern for a rapidly worsening infection. Will treat with abx with new lower respiratory symptoms Prednisone burst as above. Return if worsening or failing to improve Discussed supportive care and ER precautions, pt voices understanding. Patient encouraged to call clinic with any questions, comments, or concerns.  I discussed the assessment and treatment plan with the patient. The patient was provided an opportunity to ask questions and all were answered. The patient agreed with the plan and demonstrated an understanding of the instructions.   The patient was advised to call back or seek an in-person evaluation if the symptoms worsen or if the condition fails to improve as anticipated.  I provided 16 minutes of non-face-to-face time during this encounter.  Maximiano Coss, NP  Primary [facility] at [facility]

## 2020-12-01 ENCOUNTER — Encounter: Payer: Self-pay | Admitting: Family Medicine

## 2020-12-01 ENCOUNTER — Other Ambulatory Visit: Payer: Self-pay

## 2020-12-01 ENCOUNTER — Telehealth (INDEPENDENT_AMBULATORY_CARE_PROVIDER_SITE_OTHER): Payer: Medicare Other | Admitting: Family Medicine

## 2020-12-01 VITALS — Ht 65.0 in | Wt 176.0 lb

## 2020-12-01 DIAGNOSIS — J189 Pneumonia, unspecified organism: Secondary | ICD-10-CM | POA: Diagnosis not present

## 2020-12-01 MED ORDER — ALBUTEROL SULFATE HFA 108 (90 BASE) MCG/ACT IN AERS: 2.0000 | INHALATION_SPRAY | Freq: Four times a day (QID) | RESPIRATORY_TRACT | 0 refills | Status: DC | PRN

## 2020-12-01 MED ORDER — DOXYCYCLINE HYCLATE 100 MG PO TABS
100.0000 mg | ORAL_TABLET | Freq: Two times a day (BID) | ORAL | 0 refills | Status: DC
Start: 2020-12-01 — End: 2021-01-01

## 2020-12-01 MED ORDER — PULMICORT FLEXHALER 90 MCG/ACT IN AEPB: 2.0000 | INHALATION_SPRAY | Freq: Two times a day (BID) | RESPIRATORY_TRACT | 0 refills | Status: DC

## 2020-12-01 NOTE — Progress Notes (Signed)
Virtual Visit via Video   I connected with patient on 12/01/20 at 10:30 AM EDT by a video enabled telemedicine application and verified that I am speaking with the correct person using two identifiers.  Location patient: Home Location provider: [facility], Office Persons participating in the virtual visit: Patient, Provider, [place] [facility] C)  I discussed the limitations of evaluation and management by telemedicine and the availability of in person appointments. The patient expressed understanding and agreed to proceed.  Subjective:   HPI:   Cough- pt was seen on 9/15 for respiratory sxs.  Was given Zpack, Tessalon, Tussionex, and Prednisone.  Sxs started 7 days ago.  Worsened on Wed/Thursday and has actually worsened over the weekend.  + cough, wheezing, fever.  Denies body aches but 'extreme weakness'.  Decreased appetite.  Attempting to increase fluid intake.  + tachycardia.  [facility]- [facility].  Did home test yesterday that was still negative.  Tested negative for flu.  Doesn't currently have an inhaler.  ROS:   See pertinent positives and negatives per HPI.  Patient Active Problem List   Diagnosis Date Noted   Prolapse of female genital organs 07/09/2020   Chronic pain of both shoulders 11/28/2019   Eustachian tube dysfunction, bilateral 11/05/2019   Acquired thrombophilia ([place]) 03/08/2019   Malignant neoplasm of lower-inner quadrant of left breast in female, estrogen receptor positive ([place]) 07/26/2018   Anxiety about health 05/31/2018   Paroxysmal atrial fibrillation ([place]) 05/18/2018   Overweight (BMI 25.0-29.9) [id number]   Systolic murmur 99991111   Shingles 11/23/2016   Arthritis 05/26/2016   Insomnia 05/26/2016   Neuropathy 05/26/2016   Positive ANA (antinuclear antibody) 05/26/2016   Hyperlipidemia 05/26/2016   Neck and shoulder pain 01/26/2014   Cervicalgia 01/26/2014   Palpitations 08/31/2012   METATARSALGIA 05/06/2010    PLANTAR FASCIITIS, LEFT 05/06/2010   FOOT PAIN, BILATERAL 05/06/2010    Social History   Tobacco Use   Smoking status: Never   Smokeless tobacco: Never  Substance Use Topics   Alcohol use: Yes    Comment: rare    Current Outpatient Medications:    albuterol (VENTOLIN HFA) 108 (90 Base) MCG/ACT inhaler, Inhale 2 puffs into the lungs every 6 (six) hours as needed for wheezing or shortness of breath., Disp: 8 g, Rfl: 0   azithromycin (ZITHROMAX) 250 MG tablet, Take 2 tablets on day 1, then 1 tablet daily on days 2 through 5, Disp: 6 tablet, Rfl: 0   benzonatate (TESSALON) 200 MG capsule, Take 1 capsule (200 mg total) by mouth 2 (two) times daily as needed for cough., Disp: 20 capsule, Rfl: 0   Budesonide (PULMICORT FLEXHALER) 90 MCG/ACT inhaler, Inhale 2 puffs into the lungs 2 (two) times daily., Disp: 1 each, Rfl: 0   doxycycline (VIBRA-TABS) 100 MG tablet, Take 1 tablet (100 mg total) by mouth 2 (two) times daily. Take w/ food, Disp: 14 tablet, Rfl: 0   meloxicam (MOBIC) 15 MG tablet, Take 1 tablet (15 mg total) by mouth daily., Disp: 30 tablet, Rfl: 1   metoprolol tartrate (LOPRESSOR) 25 MG tablet, TAKE 1/2-1 TABLET BY MOUTH EVERY 6-8HOURS FOR BREAKTHROUGH AFIB FOR HEART RATES OVER 100, Disp: 45 tablet, Rfl: 3   polyvinyl alcohol (LIQUIFILM TEARS) 1.4 % ophthalmic solution, Place 1 drop into both eyes as needed for dry eyes., Disp: , Rfl:    predniSONE (DELTASONE) 20 MG tablet, Take 1 tablet (20 mg total) by mouth daily with breakfast., Disp: 8 tablet, Rfl:  0   rivaroxaban (XARELTO) 20 MG TABS tablet, Take 1 tablet (20 mg total) by mouth daily with supper. Appointment needed with cardiologist, Disp: 90 tablet, Rfl: 0   rosuvastatin (CRESTOR) 10 MG tablet, Take 1 tablet (10 mg total) by mouth daily., Disp: 90 tablet, Rfl: 1   valACYclovir (VALTREX) 1000 MG tablet, Take 1 tablet (1,000 mg total) by mouth daily., Disp: 30 tablet, Rfl: 5   zolpidem (AMBIEN) 10 MG tablet, Take 1 tablet (10 mg  total) by mouth at bedtime., Disp: 30 tablet, Rfl: 3   CALCIUM PO, Take by mouth daily. (Patient not taking: Reported on 12/01/2020), Disp: , Rfl:    HYDROcodone bit-homatropine (HYCODAN) 5-1.5 MG/5ML syrup, Take 5 mLs by mouth every 8 (eight) hours as needed for cough. (Patient not taking: Reported on 12/01/2020), Disp: 120 mL, Rfl: 0   VITAMIN D PO, Take by mouth daily. Vitamin D3 and K2 (Patient not taking: Reported on 12/01/2020), Disp: , Rfl:   Allergies  Allergen Reactions   Amitriptyline     Bladder retention   Codeine Nausea And Vomiting   Nortriptyline     Bladder retention     Objective:   Ht '5\' 5"'$  (1.651 m) Comment: pt reported  Wt 176 lb (79.8 kg) Comment: pt reported  BMI 29.29 kg/m  AAOx3, NAD, lying in bed NCAT, EOMI No obvious CN deficits Pale Pt is able to speak clearly, coherently without shortness of breath or increased work of breathing.  + hacking cough Thought process is linear.  Mood is appropriate.   Assessment and Plan:   Likely pneumonia- pt's sxs have worsened since her visit on Friday and she has now developed fever.  2 COVID tests negative and flu test also negative.  Cough is wet, painful, and at times productive.  Will add Doxy for better abx coverage, add ICS and rescue inhaler.  Continue cough syrup and prednisone.  Reviewed supportive care and red flags that should prompt return.  Pt expressed understanding and is in agreement w/ plan.    Annye Asa, MD 12/01/2020

## 2020-12-05 ENCOUNTER — Encounter: Payer: Self-pay | Admitting: Family Medicine

## 2020-12-05 MED ORDER — PROMETHAZINE-DM 6.25-15 MG/5ML PO SYRP: 5.0000 mL | ORAL_SOLUTION | Freq: Four times a day (QID) | ORAL | 0 refills | Status: DC | PRN

## 2020-12-05 NOTE — Addendum Note (Signed)
Addended by: [name] on: 12/05/2020 09:59 AM   Modules accepted: Orders

## 2020-12-09 ENCOUNTER — Other Ambulatory Visit: Payer: Self-pay | Admitting: Family Medicine

## 2020-12-10 DIAGNOSIS — Z20822 Contact with and (suspected) exposure to covid-19: Secondary | ICD-10-CM | POA: Diagnosis not present

## 2020-12-30 DIAGNOSIS — H35363 Drusen (degenerative) of macula, bilateral: Secondary | ICD-10-CM | POA: Diagnosis not present

## 2021-01-01 ENCOUNTER — Encounter: Payer: Self-pay | Admitting: Internal Medicine

## 2021-01-01 ENCOUNTER — Telehealth (INDEPENDENT_AMBULATORY_CARE_PROVIDER_SITE_OTHER): Payer: Medicare Other | Admitting: Internal Medicine

## 2021-01-01 VITALS — BP 139/87 | HR 59 | Temp 97.7°F | Wt 180.0 lb

## 2021-01-01 DIAGNOSIS — I493 Ventricular premature depolarization: Secondary | ICD-10-CM | POA: Diagnosis not present

## 2021-01-01 DIAGNOSIS — I351 Nonrheumatic aortic (valve) insufficiency: Secondary | ICD-10-CM | POA: Diagnosis not present

## 2021-01-01 DIAGNOSIS — I480 Paroxysmal atrial fibrillation: Secondary | ICD-10-CM | POA: Diagnosis not present

## 2021-01-01 MED ORDER — METOPROLOL TARTRATE 25 MG PO TABS: ORAL_TABLET | ORAL | 3 refills | Status: DC

## 2021-01-01 MED ORDER — RIVAROXABAN 20 MG PO TABS: 20.0000 mg | ORAL_TABLET | Freq: Every day | ORAL | 3 refills | Status: DC

## 2021-01-01 NOTE — Progress Notes (Signed)
Virtual Visit via Video Note   This visit type was conducted due to national recommendations for restrictions regarding the COVID-19 Pandemic (e.g. social distancing) in an effort to limit this patient's exposure and mitigate transmission in our community.  Due to her co-morbid illnesses, this patient is at least at moderate risk for complications without adequate follow up.  This format is felt to be most appropriate for this patient at this time.  All issues noted in this document were discussed and addressed.  A limited physical exam was performed with this format.  Please refer to the patient's chart for her consent to telehealth for [facility].      Date:  01/01/2021   ID:  Lorraine [name], DOB Oct 22, 1950, MRN [id number] The patient was identified using 2 identifiers.  Evaluation Performed:  Follow-Up Visit  Patient Location:  [place] [id number]  Provider location:   [address], [place] [address] [place], [place] [address]  PCP:  [name], MD  Cardiologist:  [name], MD Electrophysiologist:  None   Chief Complaint:  Follow-up afib  History of Present Illness:    Lorraine [name] is a pleasant 71 year old female who works as a Freight forwarder at [employer].  She was referred to [place] for evaluation of missed or skipped beats. She does not report any awareness of palpitations, however she notices that she occasionally skips beats. This actually may occur on a daily basis. Has been present for many years, however recently was brought to her attention by her primary care doctor. She is actually fairly active, and belongs to the [employer]. She is often swimming or doing land exercises, and does not have any significant limitation to exercise. She denies any chest pain or worsening shortness of breath. Recently she was trying to lose weight and was prescribed phentermine. This did cause marked increase in her palpitations and she discontinued  the medicine secondary to that.  07/04/2019  Lorraine [name] is seen today for preoperative clearance.  She is considered a new patient as I last saw her in 2014 for PVCs.  In January 2020 she underwent surgery and postoperatively was found to have A. fib with RVR.  She was taken to [facility] and ultimately converted.  Since then she has been followed in the A. fib clinic by Adline Peals, PA-C.  He did a thorough work-up including an echo and a Myoview stress test earlier last year.  The stress test was negative for ischemia the echo showed normal LV function however she did have mild aortic insufficiency which was noted on exam.  Symptomatically she is well.  She denies chest pain or worsening shortness of breath however notes significant shoulder pain for which she has been evaluated for surgery.  12/31/2019  Lorraine [name] returns today for follow-up.  She reports a slow recovery after recent shoulder surgery.  In fact she says since then she has been short of breath with minimal exertion.  She says it seems to be getting worse.  She started doing more exercise but it does not seem to be improving.  She denies any symptoms of atrial fibrillation, in fact questions whether she actually had it although we did have evidence of it.  She has been compliant with her Xarelto.  Labs as of September 15 showed no evidence of anemia with an H&H of 14 and 42.  EKG today shows no atrial fibrillation rather a sinus bradycardia with frequent PVCs.  She says she is unaware of the PVCs.  She does note when she exercises her heart rate elevates.  At times she notes tachypalpitations which can occur with exertion or at rest.  This could be atrial fibrillation.  She has not taken her metoprolol regularly due to bradycardia and therefore is at risk for breakthrough A. fib with RVR.  01/01/2021  Lorraine [name] returns today for follow-up.  She notes some improvement in her fatigue symptoms that were worse after shoulder surgery.   Unfortunately she has had a number of upper respiratory infections, not COVID but however required multiple antibiotics and other therapies which were associated with breakthrough episodes of A. fib.  This is improved somewhat however she continues to have episodes at least a couple times a week.  She is using metoprolol as needed for this because of bradycardia.  Her metoprolol is a "old" and she was concerned about whether it was still effective.  She continues on Xarelto.  She denies any bleeding complications with this.  She has been able to capture her episodes of A. fib with the cardio mobile device.  She is quite symptomatic with them.  She was previously seen in the A. fib clinic but prefers not to follow-up with them in the future.   The patient does not have symptoms concerning for COVID-19 infection (fever, chills, cough, or new SHORTNESS OF BREATH).    Prior CV studies:   The following studies were reviewed today:  Chart reviewed, lab work  PMHx:  Past Medical History:  Diagnosis Date   Anxiety    Aortic atherosclerosis ([place]) 02/03/2018   Noted on CT Abd/Pelvis   AR (aortic regurgitation) 07/18/2017   Mild, noted on ECHO   Bilateral cataracts    Bilateral plantar fasciitis    Chronic knee pain    Chronic low back pain    Chronic lumbar radiculopathy 07/14/2017   Mild, L5, noted on electromyography   History of palpitations    2014-- low risk exercise tolerence test, no ischemi, PVC's (09-06-2012)   HSV-1 (herpes simplex virus 1) infection    Cold sore   Hx of colonic polyps    Hyperlipidemia    Insomnia    Lactose intolerance    Left nephrolithiasis 02/03/2018   Nonobstructing, noted on CT Abd/pelvis   Morton's metatarsalgia, neuralgia, or neuroma, bilateral    Osteoarthritis    knees and lumbar, feet   Osteopenia    Peripheral neuropathy    bilateral feet -- burning and stinging   PONV (postoperative nausea and vomiting)    severe   PVC's (premature ventricular  contractions)    Systolic murmur    "Slight"   Vaginal cyst    Wears glasses     Past Surgical History:  Procedure Laterality Date   ANTERIOR CERVICAL DECOMP/DISCECTOMY FUSION  01/2014   C4 -- C6   CATARACT EXTRACTION W/ INTRAOCULAR LENS  IMPLANT, BILATERAL  2015   COLONOSCOPY  05/14/2015   CYSTO WITH HYDRODISTENSION N/A 04/14/2018   Procedure: CYSTOSCOPY/HYDRODISTENSION WITH INSTILLATION OF PYRIDIUM AND MARCAINE, BLADDER BIOPSY WITH FULGERATION 0.5 TO 2 CM;  Surgeon: [name], MD;  Location: [facility];  Service: Urology;  Laterality: N/A;   ELBOW DEBRIDEMENT Right 09/10/2010   and tendon debridement and radial tunnel release   EXCISION MORTON'S NEUROMA Bilateral left 02-20-2009/  right & left 05-08-2009   EXCISION VAGINAL CYST N/A 07/01/2016   Procedure: EXCISION VAGINAL CYST;  Surgeon: Tyson Dense, MD;  Location: [facility]  ;  Service: Gynecology;  Laterality: N/A;   PLANTAR FASCIA RELEASE Bilateral left 04-02-2010/  right 04-02-2016   TONSILLECTOMY AND ADENOIDECTOMY  child   TOTAL ABDOMINAL HYSTERECTOMY  1974   w/  Left salpingoophorectomy and Partial right salpingoophorectomy    FAMHx:  Family History  Problem Relation Age of Onset   Lung cancer Mother        lung   Heart attack Father    Memory loss Father    Cancer Paternal Grandfather        colon   Multiple sclerosis Brother     SOCHx:   reports that she has never smoked. She has never used smokeless tobacco. She reports current alcohol use. She reports that she does not use drugs.  ALLERGIES:  Allergies  Allergen Reactions   Amitriptyline     Bladder retention   Codeine Nausea And Vomiting   Nortriptyline     Bladder retention     MEDS:  No outpatient medications have been marked as taking for the 01/01/21 encounter (Video Visit) with [name], MD.     ROS: Pertinent items noted in HPI and remainder of comprehensive ROS otherwise  negative.  Labs/Other Tests and Data Reviewed:    Recent Labs: 05/28/2020: ALT 17; BUN 30; Creatinine, Ser 0.72; Hemoglobin 15.3; Platelets 247.0; Potassium 3.9; Sodium 141; TSH 1.48   Recent Lipid Panel Lab Results  Component Value Date/Time   CHOL 174 05/28/2020 08:58 AM   TRIG 77.0 05/28/2020 08:58 AM   HDL 51.50 05/28/2020 08:58 AM   CHOLHDL 3 05/28/2020 08:58 AM   LDLCALC 107 (H) 05/28/2020 08:58 AM    Wt Readings from Last 3 Encounters:  01/01/21 180 lb (81.6 kg)  12/01/20 176 lb (79.8 kg)  11/27/20 181 lb (82.1 kg)     Exam:    Vital Signs:  BP 139/87   Pulse (!) 59   Temp 97.7 F (36.5 C)   Wt 180 lb (81.6 kg)   BMI 29.95 kg/m    General appearance: alert and no distress Lungs: No visual respiratory difficulty Abdomen: soft, non-tender; bowel sounds normal; no masses,  no organomegaly Extremities: extremities normal, atraumatic, no cyanosis or edema Skin: Skin color, texture, turgor normal. No rashes or lesions Neurologic: Grossly normal Psych: Pleasant  ASSESSMENT & PLAN:    Paroxysmal atrial fibrillation-CHA2DS2-VASc score of 2, on Xarelto Frequent PVCs Dyslipidemia Aortic insufficiency  Lorraine [name] had more significant and frequent atrial fibrillation recently associated with illness.  This is decreased somewhat but is still an issue.  It does not seem well controlled with as needed metoprolol.  She is hesitant to take it on a daily basis due to issues with bradycardia.  We discussed possibility of antiarrhythmic therapy, perhaps flecainide.  She mentioned that she had been recommended Dr. Quentin Ore for management of her atrial fibrillation and inquired if he could be helpful for this.  It is reasonable in my opinion for her to see him to discuss options such as AAD therapy versus possible earlier ablation.  We will place a referral and plan follow-up with me in 6 months or sooner as necessary.  COVID-19 Education: The signs and symptoms of COVID-19 were  discussed with the patient and how to seek care for testing (follow up with PCP or arrange E-visit).  The importance of social distancing was discussed today.  Patient Risk:   After full review of this patients clinical status, I feel that they are at least moderate risk  at this time.  Time:   Today, I have spent 25 minutes with the patient with telehealth technology discussing advanced management of recurrent atrial fibrillation.     Medication Adjustments/Labs and Tests Ordered: Current medicines are reviewed at length with the patient today.  Concerns regarding medicines are outlined above.   Tests Ordered: Orders Placed This Encounter  Procedures   Ambulatory referral to Cardiac Electrophysiology    Medication Changes: Meds ordered this encounter  Medications   metoprolol tartrate (LOPRESSOR) 25 MG tablet    Sig: TAKE 1/2-1 TABLET BY MOUTH EVERY 6-8HOURS FOR BREAKTHROUGH AFIB FOR HEART RATES OVER 100    Dispense:  45 tablet    Refill:  3   rivaroxaban (XARELTO) 20 MG TABS tablet    Sig: Take 1 tablet (20 mg total) by mouth daily with supper. Appointment needed with cardiologist    Dispense:  90 tablet    Refill:  3    Disposition:  in 6 month(s)  [name], MD, [facility], [place] Director of the Advanced Lipid Disorders &  Cardiovascular Risk Reduction Clinic Diplomate of the American Board of Clinical Lipidology Attending Cardiologist  Direct Dial: [phone]  Fax: [phone]  Website:  [email or web address]  [name], MD  01/01/2021 8:26 PM

## 2021-01-01 NOTE — Patient Instructions (Signed)
Medication Instructions:  Continue current medications  *If you need a refill on your cardiac medications before your next appointment, please call your pharmacy*  Follow-Up: At [facility], you and your health needs are our priority.  As part of our continuing mission to provide you with exceptional heart care, we have created designated Provider Care Teams.  These Care Teams include your primary Cardiologist (physician) and Advanced Practice Providers (APPs -  Physician Assistants and Nurse Practitioners) who all work together to provide you with the care you need, when you need it.  We recommend signing up for the patient portal called "MyChart".  Sign up information is provided on this After Visit Summary.  MyChart is used to connect with patients for Virtual Visits (Telemedicine).  Patients are able to view lab/test results, encounter notes, upcoming appointments, etc.  Non-urgent messages can be sent to your provider as well.   To learn more about what you can do with MyChart, go to [email or web address].    Your next appointment:   6 month(s)  The format for your next appointment:   In Person  Provider:   Raliegh Ip Mali Hilty, MD   Other Instructions You have been referred to Dr. Lars Mage (cardiac electrophysiologist) [facility] [address] [place]

## 2021-01-07 DIAGNOSIS — Z23 Encounter for immunization: Secondary | ICD-10-CM | POA: Diagnosis not present

## 2021-01-12 DIAGNOSIS — Z20822 Contact with and (suspected) exposure to covid-19: Secondary | ICD-10-CM | POA: Diagnosis not present

## 2021-01-15 ENCOUNTER — Encounter: Payer: Self-pay | Admitting: Family Medicine

## 2021-01-15 ENCOUNTER — Ambulatory Visit (INDEPENDENT_AMBULATORY_CARE_PROVIDER_SITE_OTHER): Payer: Medicare Other | Admitting: Cardiology

## 2021-01-15 ENCOUNTER — Encounter: Payer: Self-pay | Admitting: Cardiology

## 2021-01-15 ENCOUNTER — Other Ambulatory Visit: Payer: Self-pay

## 2021-01-15 VITALS — BP 128/76 | HR 58 | Ht 65.0 in | Wt 186.0 lb

## 2021-01-15 DIAGNOSIS — I480 Paroxysmal atrial fibrillation: Secondary | ICD-10-CM | POA: Diagnosis not present

## 2021-01-15 DIAGNOSIS — I4891 Unspecified atrial fibrillation: Secondary | ICD-10-CM | POA: Diagnosis not present

## 2021-01-15 NOTE — Progress Notes (Signed)
Electrophysiology Office Note:    Date:  01/15/2021   ID:  Lorraine [name], DOB November 05, 1950, MRN [id number]  PCP:  [name], MD  [facility] Cardiologist:  [name], MD  [facility] Electrophysiologist:  Vickie Epley, MD   Referring MD: [name], MD   Chief Complaint: AF  History of Present Illness:    Lorraine [name] is a 70 y.o. female who presents for an evaluation of AF at the request of Dr Debara Pickett. Their medical history includes AR, AF w/ RVR, PVCs, HLD. She was seen by Dr Debara Pickett on 01/01/2021 for follow up.  At that appointment she reported 1-2 episodes of AF per week. She takes xarelto for stroke ppx. She is symptomatic with her AF.  Today she confirms the above.  She tells me that after a upper respiratory infection in September 2022 her episodes of atrial fibrillation became quite frequent.  She was taking metoprolol 3 times a day to try to manage the A. fib episodes.  He has not had an A. fib episode since October 20 but she is very concerned about her A. fib progressing the future.  She would like to avoid exposure to antiarrhythmic drugs.  She had a friend who I also see in clinic who experienced a side effect associated with antiarrhythmic drug therapy.  She is also interested in avoiding long-term exposure anticoagulation if possible.     Past Medical History:  Diagnosis Date   Anxiety    Aortic atherosclerosis ([place]) 02/03/2018   Noted on CT Abd/Pelvis   AR (aortic regurgitation) 07/18/2017   Mild, noted on ECHO   Bilateral cataracts    Bilateral plantar fasciitis    Chronic knee pain    Chronic low back pain    Chronic lumbar radiculopathy 07/14/2017   Mild, L5, noted on electromyography   History of palpitations    2014-- low risk exercise tolerence test, no ischemi, PVC's (09-06-2012)   HSV-1 (herpes simplex virus 1) infection    Cold sore   Hx of colonic polyps    Hyperlipidemia    Insomnia    Lactose intolerance    Left  nephrolithiasis 02/03/2018   Nonobstructing, noted on CT Abd/pelvis   Morton's metatarsalgia, neuralgia, or neuroma, bilateral    Osteoarthritis    knees and lumbar, feet   Osteopenia    Peripheral neuropathy    bilateral feet -- burning and stinging   PONV (postoperative nausea and vomiting)    severe   PVC's (premature ventricular contractions)    Systolic murmur    "Slight"   Vaginal cyst    Wears glasses     Past Surgical History:  Procedure Laterality Date   ANTERIOR CERVICAL DECOMP/DISCECTOMY FUSION  01/2014   C4 -- C6   CATARACT EXTRACTION W/ INTRAOCULAR LENS  IMPLANT, BILATERAL  2015   COLONOSCOPY  05/14/2015   CYSTO WITH HYDRODISTENSION N/A 04/14/2018   Procedure: CYSTOSCOPY/HYDRODISTENSION WITH INSTILLATION OF PYRIDIUM AND MARCAINE, BLADDER BIOPSY WITH FULGERATION 0.5 TO 2 CM;  Surgeon: [name], MD;  Location: [facility];  Service: Urology;  Laterality: N/A;   ELBOW DEBRIDEMENT Right 09/10/2010   and tendon debridement and radial tunnel release   EXCISION MORTON'S NEUROMA Bilateral left 02-20-2009/  right & left 05-08-2009   EXCISION VAGINAL CYST N/A 07/01/2016   Procedure: EXCISION VAGINAL CYST;  Surgeon: Tyson Dense, MD;  Location: [facility];  Service: Gynecology;  Laterality: N/A;   PLANTAR FASCIA RELEASE  Bilateral left 04-02-2010/  right 04-02-2016   TONSILLECTOMY AND ADENOIDECTOMY  child   TOTAL ABDOMINAL HYSTERECTOMY  1974   w/  Left salpingoophorectomy and Partial right salpingoophorectomy    Current Medications: Current Meds  Medication Sig   CALCIUM PO Take by mouth daily.   meloxicam (MOBIC) 15 MG tablet Take 1 tablet (15 mg total) by mouth daily.   metoprolol tartrate (LOPRESSOR) 25 MG tablet TAKE 1/2-1 TABLET BY MOUTH EVERY 6-8HOURS FOR BREAKTHROUGH AFIB FOR HEART RATES OVER 100   polyvinyl alcohol (LIQUIFILM TEARS) 1.4 % ophthalmic solution Place 1 drop into both eyes as needed for dry eyes.    rivaroxaban (XARELTO) 20 MG TABS tablet Take 1 tablet (20 mg total) by mouth daily with supper. Appointment needed with cardiologist   rosuvastatin (CRESTOR) 10 MG tablet Take 1 tablet (10 mg total) by mouth daily.   valACYclovir (VALTREX) 1000 MG tablet Take 1 tablet (1,000 mg total) by mouth daily.   VITAMIN D PO Take by mouth daily. Vitamin D3 and K2   zolpidem (AMBIEN) 10 MG tablet Take 1 tablet (10 mg total) by mouth at bedtime.     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  Has one son.  Works as a Counsellor for [employer].  Education: some college.    Social Determinants of Health   Financial Resource Strain: Not on file  Food Insecurity: Not on file  Transportation Needs: Not on file  Physical Activity: Not on file  Stress: Not on file  Social Connections: Not on file     Family History: The patient's family history includes Cancer in her paternal grandfather; Heart attack in her father; Lung cancer in her mother; Memory loss in her father; Multiple sclerosis in her brother.  ROS:   Please see the history of present illness.    All other systems reviewed and are negative.  EKGs/Labs/Other Studies Reviewed:    The following studies were reviewed today:  November 2021 echo Left ventricular function normal, 55% Right ventricular function  normal Mild AI Trivial MR  EKG:  The ekg ordered today demonstrates sinus rhythm.  Normal intervals.  Ventricular rate 58 bpm.   Recent Labs: 05/28/2020: ALT 17; BUN 30; Creatinine, Ser 0.72; Hemoglobin 15.3; Platelets 247.0; Potassium 3.9; Sodium 141; TSH 1.48  Recent Lipid Panel    Component Value Date/Time   CHOL 174 05/28/2020 0858   TRIG 77.0 05/28/2020 0858   HDL 51.50 05/28/2020 0858   CHOLHDL 3 05/28/2020 0858   VLDL 15.4 05/28/2020 0858   LDLCALC 107 (H) 05/28/2020 0858    Physical Exam:    VS:  BP 128/76   Pulse (!) 58   Ht 5\' 5"  (1.651 m)   Wt 186 lb (84.4 kg)  SpO2 96%   BMI 30.95 kg/m     Wt Readings from Last 3 Encounters:  01/15/21 186 lb (84.4 kg)  01/01/21 180 lb (81.6 kg)  12/01/20 176 lb (79.8 kg)     GEN:  Well nourished, well developed in no acute distress HEENT: Normal NECK: No JVD; No carotid bruits LYMPHATICS: No lymphadenopathy CARDIAC: RRR, no murmurs, rubs, gallops RESPIRATORY:  Clear to auscultation without rales, wheezing or rhonchi  ABDOMEN: Soft, non-tender, non-distended MUSCULOSKELETAL:  No edema; No deformity  SKIN: Warm and dry NEUROLOGIC:  Alert and oriented x 3 PSYCHIATRIC:  Normal affect       ASSESSMENT:    1. Paroxysmal atrial fibrillation (HCC)    PLAN:    In order of problems listed above:  #Paroxysmal atrial fibrillation Highly symptomatic.  Progressively worsening with time.  She is interested in a rhythm control strategy which I think is the right thing to do given her overall health and symptomatic nature of the A. fib episodes.  We discussed antiarrhythmic drug therapy and catheter ablation.  She is not interested in antiarrhythmic drugs given a friend's bad experience with them and also the potential for off target effects.  I discussed the ablation procedure in detail including the risk, recovery and efficacy and she is interested in proceeding.  I think she is overall an excellent candidate for catheter  ablation.  She will continue to take Xarelto uninterrupted around the time of the procedure.  She also expressed a strong desire to avoid long-term exposure to anticoagulation.  She tells me that the cost of the NOACs are prohibitive and she would like to explore options for avoiding long-term use of the anticoagulants.  We discussed the watchman procedure in detail during today's visit and she may be interested in pursuing this as well.     Risk, benefits, and alternatives to EP study and radiofrequency ablation for afib were also discussed in detail today. These risks include but are not limited to stroke, bleeding, vascular damage, tamponade, perforation, damage to the esophagus, lungs, and other structures, pulmonary vein stenosis, worsening renal function, and death. The patient understands these risk and wishes to proceed.  We will therefore proceed with catheter ablation at the next available time.  Carto, ICE, anesthesia are requested for the procedure.  Will also obtain CT PV protocol prior to the procedure to exclude LAA thrombus and further evaluate atrial anatomy.  ----------  I have seen Lorraine [name] in the office today who is being considered for a Watchman left atrial appendage closure device. I believe they will benefit from this procedure given their history of atrial fibrillation, CHA2DS2-VASc score of 2 and unadjusted ischemic stroke rate of 2.9% per year. The patient's chart has been reviewed and I feel that they would be a candidate for short term oral anticoagulation after Watchman implant.  She would like to avoid long-term use of anticoagulation given the prohibitive cost of the NOACs.  It is my belief that after undergoing a LAA closure procedure, Lorraine [name] will not need long term anticoagulation which eliminates anticoagulation side effects and major bleeding risk.  Procedural risks for the Watchman implant have been reviewed with the patient including a 0.5% risk of  stroke, <1% risk of perforation and <1% risk of device embolization.    The published clinical data on the safety and effectiveness of WATCHMAN include but are not limited to the following: - Holmes DR, Mechele Claude, Sick P et al. for the PROTECT  AF Investigators. Percutaneous closure of the left atrial appendage versus warfarin therapy for prevention of stroke in patients with atrial fibrillation: a randomised non-inferiority trial. Lancet 2009; 374: 534-42. Mechele Claude, Doshi SK, Abelardo Diesel D et al. on behalf of the PROTECT AF Investigators. Percutaneous Left Atrial Appendage Closure for Stroke Prophylaxis in Patients With Atrial Fibrillation 2.3-Year Follow-up of the PROTECT AF (Watchman Left Atrial Appendage System for Embolic Protection in Patients With Atrial Fibrillation) Trial. Circulation 2013; 127:720-729. - Alli O, Doshi S,  Kar S, Reddy VY, Sievert H et al. Quality of Life Assessment in the Randomized PROTECT AF (Percutaneous Closure of the Left Atrial Appendage Versus Warfarin Therapy for Prevention of Stroke in Patients With Atrial Fibrillation) Trial of Patients at Risk for Stroke With Nonvalvular Atrial Fibrillation. J Am Coll Cardiol 2013; 16:1096-0. Vertell Limber DR, Tarri Abernethy, Price M, [place], Sievert H, Doshi S, Huber K, Reddy V. Prospective randomized evaluation of the Watchman left atrial appendage Device in patients with atrial fibrillation versus long-term warfarin therapy; the PREVAIL trial. Journal of the [employer] of Cardiology, Vol. 4, No. 1, 2014, 1-11. - Kar S, Doshi SK, Sadhu A, Horton R, Osorio J et al. Primary outcome evaluation of a next-generation left atrial appendage closure device: results from the PINNACLE FLX trial. Circulation 2021;143(18)1754-1762.    After today's visit with the patient which was dedicated solely for shared decision making visit regarding LAA closure device, the patient decided to think over the watchman procedure and let us know if she  would like to proceed.  In the meantime we will proceed with pulmonary vein isolation procedure.      HAS-BLED score 1 Hypertension No  Abnormal renal and liver function (Dialysis, transplant, Cr >2.26 mg/dL /Cirrhosis or Bilirubin >2x Normal or AST/ALT/AP >3x Normal) No  Stroke No  Bleeding No  Labile INR (Unstable/high INR) No  Elderly (>65) Yes  Drugs or alcohol (? 8 drinks/week, anti-plt or NSAID) No         Total time spent with patient today 65 minutes. This includes reviewing records, evaluating the patient and coordinating care.  Medication Adjustments/Labs and Tests Ordered: Current medicines are reviewed at length with the patient today.  Concerns regarding medicines are outlined above.  No orders of the defined types were placed in this encounter.  No orders of the defined types were placed in this encounter.    Signed, Hilton Cork. Quentin Ore, MD, [facility], [facility] 01/15/2021 2:31 PM    Electrophysiology [facility]

## 2021-01-15 NOTE — Patient Instructions (Addendum)
Medication Instructions:  Your physician recommends that you continue on your current medications as directed. Please refer to the Current Medication list given to you today. *If you need a refill on your cardiac medications before your next appointment, please call your pharmacy*  Lab Work: None ordered. If you have labs (blood work) drawn today and your tests are completely normal, you will receive your results only by: [place] (if you have MyChart) OR A paper copy in the mail If you have any lab test that is abnormal or we need to change your treatment, we will call you to review the results.  Testing/Procedures: Your physician has requested that you have an echocardiogram. Echocardiography is a painless test that uses sound waves to create images of your heart. It provides your doctor with information about the size and shape of your heart and how well your heart's chambers and valves are working. This procedure takes approximately one hour. There are no restrictions for this procedure.  Please schedule for ECHO  Follow-Up: At [facility], you and your health needs are our priority.  As part of our continuing mission to provide you with exceptional heart care, we have created designated Provider Care Teams.  These Care Teams include your primary Cardiologist (physician) and Advanced Practice Providers (APPs -  Physician Assistants and Nurse Practitioners) who all work together to provide you with the care you need, when you need it.  Your next appointment:    SEE INSTRUCTION LETTER  Cardiac Ablation Cardiac ablation is a procedure to destroy, or ablate, a small amount of heart tissue in very specific places. The heart has many electrical connections. Sometimes these connections are abnormal and can cause the heart to beat very fast or irregularly. Ablating some of the areas that cause problems can improve the heart's rhythm or return it to normal. Ablation may be done for  people who: Have Wolff-Parkinson-White syndrome. Have fast heart rhythms (tachycardia). Have taken medicines for an abnormal heart rhythm (arrhythmia) that were not effective or caused side effects. Have a high-risk heartbeat that may be life-threatening. During the procedure, a small incision is made in the neck or the groin, and a long, thin tube (catheter) is inserted into the incision and moved to the heart. Small devices (electrodes) on the tip of the catheter will send out electrical currents. A type of X-ray (fluoroscopy) will be used to help guide the catheter and to provide images of the heart. Tell a health care provider about: Any allergies you have. All medicines you are taking, including vitamins, herbs, eye drops, creams, and over-the-counter medicines. Any problems you or family members have had with anesthetic medicines. Any blood disorders you have. Any surgeries you have had. Any medical conditions you have, such as kidney failure. Whether you are pregnant or may be pregnant. What are the risks? Generally, this is a safe procedure. However, problems may occur, including: Infection. Bruising and bleeding at the catheter insertion site. Bleeding into the chest, especially into the sac that surrounds the heart. This is a serious complication. Stroke or blood clots. Damage to nearby structures or organs. Allergic reaction to medicines or dyes. Need for a permanent pacemaker if the normal electrical system is damaged. A pacemaker is a small computer that sends electrical signals to the heart and helps your heart beat normally. The procedure not being fully effective. This may not be recognized until months later. Repeat ablation procedures are sometimes done. What happens before the procedure? Medicines Ask your  health care provider about: Changing or stopping your regular medicines. This is especially important if you are taking diabetes medicines or blood thinners. Taking  medicines such as aspirin and ibuprofen. These medicines can thin your blood. Do not take these medicines unless your health care provider tells you to take them. Taking over-the-counter medicines, vitamins, herbs, and supplements. General instructions Follow instructions from your health care provider about eating or drinking restrictions. Plan to have someone take you home from the hospital or clinic. If you will be going home right after the procedure, plan to have someone with you for 24 hours. Ask your health care provider what steps will be taken to prevent infection. What happens during the procedure?  An IV will be inserted into one of your veins. You will be given a medicine to help you relax (sedative). The skin on your neck or groin will be numbed. An incision will be made in your neck or your groin. A needle will be inserted through the incision and into a large vein in your neck or groin. A catheter will be inserted into the needle and moved to your heart. Dye may be injected through the catheter to help your surgeon see the area of the heart that needs treatment. Electrical currents will be sent from the catheter to ablate heart tissue in desired areas. There are three types of energy that may be used to do this: Heat (radiofrequency energy). Laser energy. Extreme cold (cryoablation). When the tissue has been ablated, the catheter will be removed. Pressure will be held on the insertion area to prevent a lot of bleeding. A bandage (dressing) will be placed over the insertion area. The exact procedure may vary among health care providers and hospitals. What happens after the procedure? Your blood pressure, heart rate, breathing rate, and blood oxygen level will be monitored until you leave the hospital or clinic. Your insertion area will be monitored for bleeding. You will need to lie still for a few hours to ensure that you do not bleed from the insertion area. Do not drive  for 24 hours or as long as told by your health care provider. Summary Cardiac ablation is a procedure to destroy, or ablate, a small amount of heart tissue using an electrical current. This procedure can improve the heart rhythm or return it to normal. Tell your health care provider about any medical conditions you may have and all medicines you are taking to treat them. This is a safe procedure, but problems may occur. Problems may include infection, bruising, damage to nearby organs or structures, or allergic reactions to medicines. Follow your health care provider's instructions about eating and drinking before the procedure. You may also be told to change or stop some of your medicines. After the procedure, do not drive for 24 hours or as long as told by your health care provider. This information is not intended to replace advice given to you by your health care provider. Make sure you discuss any questions you have with your health care provider. Document Revised: 01/08/2019 Document Reviewed: 01/08/2019 Elsevier Patient Education  [place].

## 2021-01-16 MED ORDER — ALPRAZOLAM 0.5 MG PO TABS
0.5000 mg | ORAL_TABLET | Freq: Two times a day (BID) | ORAL | 1 refills | Status: DC | PRN
Start: 2021-01-16 — End: 2021-06-02

## 2021-01-29 ENCOUNTER — Telehealth: Payer: Self-pay | Admitting: Hematology and Oncology

## 2021-01-29 NOTE — Telephone Encounter (Signed)
Rescheduled per provider. Left message.

## 2021-01-30 ENCOUNTER — Encounter: Payer: Self-pay | Admitting: Internal Medicine

## 2021-01-30 DIAGNOSIS — M5416 Radiculopathy, lumbar region: Secondary | ICD-10-CM | POA: Diagnosis not present

## 2021-02-03 ENCOUNTER — Encounter: Payer: Self-pay | Admitting: Cardiology

## 2021-02-03 ENCOUNTER — Other Ambulatory Visit: Payer: Self-pay

## 2021-02-03 MED ORDER — ROSUVASTATIN CALCIUM 10 MG PO TABS: 10.0000 mg | ORAL_TABLET | Freq: Every day | ORAL | 1 refills | Status: DC

## 2021-02-04 ENCOUNTER — Other Ambulatory Visit: Payer: Self-pay

## 2021-02-04 ENCOUNTER — Ambulatory Visit (HOSPITAL_COMMUNITY): Payer: Medicare Other | Attending: Cardiovascular Disease

## 2021-02-04 DIAGNOSIS — M25572 Pain in left ankle and joints of left foot: Secondary | ICD-10-CM | POA: Diagnosis not present

## 2021-02-04 DIAGNOSIS — I480 Paroxysmal atrial fibrillation: Secondary | ICD-10-CM | POA: Diagnosis not present

## 2021-02-04 DIAGNOSIS — I4891 Unspecified atrial fibrillation: Secondary | ICD-10-CM | POA: Insufficient documentation

## 2021-02-04 LAB — ECHOCARDIOGRAM COMPLETE
Area-P 1/2: 3.46 cm2
P 1/2 time: 634 msec
S' Lateral: 3.2 cm

## 2021-02-10 ENCOUNTER — Ambulatory Visit (INDEPENDENT_AMBULATORY_CARE_PROVIDER_SITE_OTHER): Payer: Medicare Other | Admitting: Podiatry

## 2021-02-10 ENCOUNTER — Encounter: Payer: Self-pay | Admitting: Podiatry

## 2021-02-10 ENCOUNTER — Other Ambulatory Visit: Payer: Self-pay

## 2021-02-10 DIAGNOSIS — M76822 Posterior tibial tendinitis, left leg: Secondary | ICD-10-CM

## 2021-02-10 MED ORDER — DEXAMETHASONE SODIUM PHOSPHATE 120 MG/30ML IJ SOLN
2.0000 mg | Freq: Once | INTRAMUSCULAR | Status: AC
  Administered 2021-02-10: 2 mg via INTRA_ARTICULAR

## 2021-02-11 NOTE — Progress Notes (Signed)
She presents today with medial foot pain left.  States that she took a yoga class about a month ago and that she had a bee sting in not too long after that and noticed the foot was very angry feeling states that afterwards with the neuropathy she saw Ortho last week they x-rayed it said that there was nothing wrong with that put her on a Sterapred pack and really did not help at all.  She says she tried wear her walking boot and it was no help there either.  Objective: Vital signs are stable she is alert and oriented x3 mildly edematous mildly erythematous overlying the insertion of the posterior tibial tendon on the navicular.  She has good inversion against resistance though it is moderately tender.  Radiographs were not not able to be reviewed through the PACS system and she did not want new radiographs at this point.  We will follow-up with her in about 4 weeks at which time if not improved we will continue with imaging.  Assessment: Posterior tibial tendinitis most likely insertional in nature.  Plan: I put her back in her cam boot recommend that she stay on it at all times I injected subcutaneously just a small amount of dexamethasone at the point of maximal tenderness right overlying the navicular tuberosity.  Discussed ice therapy as well and I will follow-up with her in 1 month.  2 mg of dexamethasone was injected.

## 2021-02-18 ENCOUNTER — Encounter: Payer: Self-pay | Admitting: Family Medicine

## 2021-02-19 MED ORDER — ZOLPIDEM TARTRATE 10 MG PO TABS
10.0000 mg | ORAL_TABLET | Freq: Every day | ORAL | 3 refills | Status: DC
Start: 2021-02-19 — End: 2021-06-15

## 2021-02-24 ENCOUNTER — Other Ambulatory Visit: Payer: Self-pay | Admitting: *Deleted

## 2021-02-24 DIAGNOSIS — M7062 Trochanteric bursitis, left hip: Secondary | ICD-10-CM | POA: Diagnosis not present

## 2021-02-24 MED ORDER — RIVAROXABAN 20 MG PO TABS: 20.0000 mg | ORAL_TABLET | Freq: Every day | ORAL | 1 refills | Status: DC

## 2021-02-24 NOTE — Telephone Encounter (Signed)
Prescription refill request for Xarelto received.  Indication: afib  Last office visit: Lorraine [name] 01/15/2021 Weight:84.4 kg  Age: 70 yo  Scr: 0.72, 05/28/2020 CrCl: 97 ml/min   Refill sent to [facility].

## 2021-02-27 ENCOUNTER — Other Ambulatory Visit: Payer: Self-pay | Admitting: Hematology and Oncology

## 2021-02-27 DIAGNOSIS — Z853 Personal history of malignant neoplasm of breast: Secondary | ICD-10-CM

## 2021-03-04 ENCOUNTER — Ambulatory Visit
Admission: RE | Admit: 2021-03-04 | Discharge: 2021-03-04 | Disposition: A | Discharge status: Home / Self Care | Payer: Medicare Other | Source: Ambulatory Visit | Attending: Hematology and Oncology | Admitting: Hematology and Oncology

## 2021-03-04 DIAGNOSIS — Z853 Personal history of malignant neoplasm of breast: Secondary | ICD-10-CM

## 2021-03-04 DIAGNOSIS — R921 Mammographic calcification found on diagnostic imaging of breast: Secondary | ICD-10-CM | POA: Diagnosis not present

## 2021-03-04 DIAGNOSIS — R922 Inconclusive mammogram: Secondary | ICD-10-CM | POA: Diagnosis not present

## 2021-03-10 ENCOUNTER — Ambulatory Visit: Payer: Medicare Other | Admitting: Hematology and Oncology

## 2021-03-10 ENCOUNTER — Other Ambulatory Visit: Payer: Self-pay | Admitting: Family Medicine

## 2021-03-10 DIAGNOSIS — Z853 Personal history of malignant neoplasm of breast: Secondary | ICD-10-CM

## 2021-03-17 ENCOUNTER — Ambulatory Visit: Payer: Medicare Other | Admitting: Hematology and Oncology

## 2021-03-17 NOTE — Progress Notes (Signed)
Patient Care Team: Lorraine Minium, MD as PCP - General (Family Medicine) Lorraine Pickett Nadean Corwin, MD as PCP - Cardiology (Cardiology) Lorraine Epley, MD as PCP - Electrophysiology (Cardiology) Lorraine Sinner Colin Benton, MD as Consulting Physician (Obstetrics and Gynecology) Lorraine [name], DPM as Consulting Physician (Podiatry) Lorraine Duos, MD as Consulting Physician (Rheumatology) Lorraine Butters, MD as Attending Physician (Orthopedic Surgery) Lorraine Casino, MD as Consulting Physician (Cardiology)  DIAGNOSIS:    ICD-10-CM   1. Malignant neoplasm of lower-inner quadrant of left breast in female, estrogen receptor positive ([place])  C50.312 Ambulatory referral to Physical Therapy   Z17.0       SUMMARY OF ONCOLOGIC HISTORY: Oncology History  Malignant neoplasm of lower-inner quadrant of left breast in female, estrogen receptor positive ([place])  07/26/2018 Initial Diagnosis   Screening mammogram detected a 10cm span of calcifications in the left breast, biopsy confirmed intermediate grade DCIS, ER 100%, PR 100%.    07/26/2018 Cancer Staging   Staging form: Breast, AJCC 8th Edition - Clinical: Stage 0 (cTis (DCIS), cN0, cM0, ER+, PR+) - Signed by Lorraine Phlegm, NP on 07/26/2018    03/20/2019 - 10/2019 Anti-estrogen oral therapy   Tamoxifen, 5mg  daily     CHIEF COMPLIANT: Follow-up of left breast DCIS on comet clinical trial  INTERVAL HISTORY: Lorraine [name] is a 71 y.o. with above-mentioned history of left breast DCIS currently on the COMET clinical trial, randomized to the active surveillance arm. She is currently on tamoxifen 5mg  daily. She presents to the clinic today for follow-up.  She reports no new problems or concerns.  Denies any lumps or nodules in the breast.  ALLERGIES:  is allergic to amitriptyline, codeine, and nortriptyline.  MEDICATIONS:  Current Outpatient Medications  Medication Sig Dispense Refill   ALPRAZolam (XANAX) 0.5 MG tablet Take 1 tablet  (0.5 mg total) by mouth 2 (two) times daily as needed for anxiety. 60 tablet 1   CALCIUM PO Take by mouth daily.     cyanocobalamin (,VITAMIN B-12,) 1000 MCG/ML injection Inject 1 mL (1,000 mcg total) into the muscle once a week. 30 mL 3   meloxicam (MOBIC) 15 MG tablet Take 1 tablet (15 mg total) by mouth daily. 30 tablet 1   metoprolol tartrate (LOPRESSOR) 25 MG tablet TAKE 1/2-1 TABLET BY MOUTH EVERY 6-8HOURS FOR BREAKTHROUGH AFIB FOR HEART RATES OVER 100 45 tablet 3   polyvinyl alcohol (LIQUIFILM TEARS) 1.4 % ophthalmic solution Place 1 drop into both eyes as needed for dry eyes.     rivaroxaban (XARELTO) 20 MG TABS tablet Take 1 tablet (20 mg total) by mouth daily with supper. 90 tablet 1   rosuvastatin (CRESTOR) 10 MG tablet Take 1 tablet (10 mg total) by mouth daily. 90 tablet 1   valACYclovir (VALTREX) 1000 MG tablet Take 1 tablet (1,000 mg total) by mouth daily. 30 tablet 5   VITAMIN D PO Take by mouth daily. Vitamin D3 and K2     zolpidem (AMBIEN) 10 MG tablet Take 1 tablet (10 mg total) by mouth at bedtime. 30 tablet 3   No current facility-administered medications for this visit.    PHYSICAL EXAMINATION: ECOG PERFORMANCE STATUS: 1 - Symptomatic but completely ambulatory  Vitals:   03/18/21 0831  BP: 132/63  Pulse: (!) 56  Resp: 18  Temp: (!) 97.5 F (36.4 C)  SpO2: 99%   Filed Weights   03/18/21 0831  Weight: 177 lb 1.6 oz (80.3 kg)     LABORATORY DATA:  I have reviewed the data as listed CMP Latest Ref Rng & Units 05/28/2020 11/28/2019 05/21/2019  Glucose 70 - 99 mg/dL 87 85 95  BUN 6 - 23 mg/dL 30(H) 22 30(H)  Creatinine 0.40 - 1.20 mg/dL 0.72 0.70 0.67  Sodium 135 - 145 mEq/L 141 139 140  Potassium 3.5 - 5.1 mEq/L 3.9 4.9 4.1  Chloride 96 - 112 mEq/L 108 105 107  CO2 19 - 32 mEq/L 25 25 25   Calcium 8.4 - 10.5 mg/dL 9.9 9.6 9.7  Total Protein 6.0 - 8.3 g/dL 7.6 6.8 6.8  Total Bilirubin 0.2 - 1.2 mg/dL 0.9 0.7 0.9  Alkaline Phos 39 - 117 U/L 76 67 83  AST 0 - 37  U/L 20 18 14   ALT 0 - 35 U/L 17 14 16     Lab Results  Component Value Date   WBC 6.0 05/28/2020   HGB 15.3 (H) 05/28/2020   HCT 45.7 05/28/2020   MCV 86.1 05/28/2020   PLT 247.0 05/28/2020   NEUTROABS 3.5 05/28/2020    ASSESSMENT & PLAN:  Malignant neoplasm of lower-inner quadrant of left breast in female, estrogen receptor positive ([place]) 07/17/2018: Screening mammogram detected a 10cm span of calcifications in the left breast, biopsy confirmed intermediate grade DCIS, ER 100%, PR 100% Stage 0   Current treatment:  1. COMET clinical trial randomized to active surveillance arm 2. Tamoxifen 20 mg daily started 09/20/2018 stopped 11/01/2018.  Vaginal dryness (Lorraine [name] treatment 11/16/2018) restarted tamoxifen 5 mg 03/30/2019 discontinued tamoxifen in August 2021.   Breast cancer surveillance: Bilateral mammogram  09/01/2020: Stable benign left breast calcifications span 9 cm Left breast mammogram 03/04/2021: Stable Breast exam 09/08/2020: Benign   Peripheral neuropathy: Unclear etiology.  Takes B12 injections at bariatric office.  She would like to see if she can get these injections as a prescription so she can do it at home.  I sent a prescription to her pharmacy.  Also sent a referral to physical therapy to evaluate dry needling for her leg pain and neuropathy.   Patient will come back in 6 months with mammograms and follow-up.    Orders Placed This Encounter  Procedures   Ambulatory referral to Physical Therapy    Referral Priority:   Routine    Referral Type:   Physical Medicine    Referral Reason:   Specialty Services Required    Requested Specialty:   Physical Therapy    Number of Visits Requested:   1   The patient has a good understanding of the overall plan. she agrees with it. she will call with any problems that may develop before the next visit here.  Total time spent: 20 mins including face to face time and time spent for planning, charting and coordination of  care  Lorraine Eisenmenger, MD, MPH 03/18/2021  I, Lorraine [name], am acting as scribe for Dr. Nicholas [name].  I have reviewed the above documentation for accuracy and completeness, and I agree with the above.

## 2021-03-18 ENCOUNTER — Other Ambulatory Visit: Payer: Self-pay

## 2021-03-18 ENCOUNTER — Inpatient Hospital Stay: Payer: Medicare Other | Attending: Hematology and Oncology | Admitting: Hematology and Oncology

## 2021-03-18 DIAGNOSIS — Z79810 Long term (current) use of selective estrogen receptor modulators (SERMs): Secondary | ICD-10-CM | POA: Diagnosis not present

## 2021-03-18 DIAGNOSIS — G629 Polyneuropathy, unspecified: Secondary | ICD-10-CM | POA: Diagnosis not present

## 2021-03-18 DIAGNOSIS — Z006 Encounter for examination for normal comparison and control in clinical research program: Secondary | ICD-10-CM

## 2021-03-18 DIAGNOSIS — Z170 Estrogen receptor positive status [ER+]: Secondary | ICD-10-CM

## 2021-03-18 DIAGNOSIS — C50312 Malignant neoplasm of lower-inner quadrant of left female breast: Secondary | ICD-10-CM | POA: Diagnosis not present

## 2021-03-18 MED ORDER — CYANOCOBALAMIN 1000 MCG/ML IJ SOLN: 1000.0000 ug | INTRAMUSCULAR | 3 refills | Status: DC

## 2021-03-18 NOTE — Research (Signed)
AFT - 25: COMPARING AN OPERATION TO MONITORING, WITH OR WITHOUT ENDOCRINE THERAPY (COMET) FOR LOW RISK DCIS: A PHASE III PROSPECTIVE RANDOMIZED TRIAL   Active Surveillance Arm: 30 month follow-up Patient here unaccompanied for her 30 month study follow-up visit and mammogram review with Dr Lindi Adie. Patient's vital signs completed. Patient confirms that she remains off tamoxifen since 11/13/19. She states that she is exercising several times a week. The patient also states that she is scheduled for an ablation for her atrial fibrillation at the end of the month.    H&P: Completed by Dr Lindi Adie. All patient's questions and concerns addressed. See clinic note from 03/18/21.    Mammogram: Unilateral (left) mammogram completed 03/04/21 and reviewed by Dr Lindi Adie. Per Dr Lindi Adie, calcifications are stable. See imaging report. BIL mammogram due with 36 month visit and scheduled for 09/03/21.    Questionnaires: Not required for this visit.  Adverse Events: See table below for solicited adverse events and other reportable adverse events. Patient reports starting gabapentin 366m daily (see medication list) in November for moderate peripheral neuropathy symptoms in BIL feet. She states that she's had "foot issues for years" but that it doesn't impact her ADLs. Dr GLindi Adieaware, sent prescription for B12 injections and referral to physical therapy for potential dry needling.   Serious Adverse Events: None.  Plan: Patient is scheduled to return for her 319 monthstudy follow-up and MD visit on 09/07/21, after her mammogram on 09/03/21. Patient thanked for her time and continued participation in the COMET study and encouraged to call research or Dr GLindi Adiewith any questions or concerns.    Adverse Event Log Study/Protocol: COMET 30 month follow-up: Research nurse met with Dr GLindi Adieto obtain attributions.   Event Grade Attribution Comments  Arthralgia 2 Unrelated Baseline comorbidity of arthritis. Unchanged. Pt takes  Mobic.  Myalgia 1 Unrelated Pt reports mild leg pain that does not limit her ADLs.   Hypertension  1 Unrelated Baseline comorbidity. Patient monitors her BP at home. Pt's BP 132/63 today.   Osteopenia 1 Unrelated Mild, per patient report. Patient gets bone density scans every 2 years.   Cholesterol high 0 N/A Baseline comorbidity. Has not had labs re-evaluated since March 2022, when they were WNL. Pt takes Crestor.  Peripheral neuropathy; sensory 2 Unrelated Moderate pain. Pt states that she has had "foot issues for years" that do not limit her ADLs. Pt recently started Gabapentin, and MD sent rx for B12 and referral for dry needling.    No other Solicited AEs present.  Grade 0 AEs: allergic reaction, fever, hot flashes, nausea, fracture.  Not evaluated Solicited AEs:  Acute Coronary Syndrome and Ischemia Cerebrovascular. Patient reports no cardiac or stroke-like symptoms.   LVickii Penna RN, BSN, CPN Clinical Research Nurse I [phone] 03/19/2021 11:03 AM

## 2021-03-18 NOTE — Assessment & Plan Note (Signed)
07/17/2018:Screening mammogram detected a 10cm span of calcifications in the left breast, biopsy confirmed intermediate grade DCIS, ER 100%, PR 100% Stage 0  Current treatment: 1. COMETclinical trial randomized to active surveillance arm 2.Tamoxifen 20 mg daily started 7/8/2020stopped 11/01/2018. Vaginal dryness ([name] treatment 11/16/2018) restarted tamoxifen 5 mg 03/30/2019  Tamoxifen toxicities: Hot flashes: mild Frequent vaginal infections required multiple courses of creams and antibiotics. She finally discontinued tamoxifen in August 2021.  Breast cancer surveillance: Bilateral mammogram 09/01/2020: Stable benign left breast calcifications span 9 cm Left breast mammogram 03/04/2021: Stable Breast exam 09/08/2020: Benign  shoulder surgery:Took a long time for that to get better  Patient will come back in 6 months with a mammogram and follow-up.

## 2021-03-19 ENCOUNTER — Encounter: Payer: Self-pay | Admitting: Physical Therapy

## 2021-03-19 ENCOUNTER — Other Ambulatory Visit: Payer: Medicare Other | Admitting: *Deleted

## 2021-03-19 ENCOUNTER — Ambulatory Visit: Payer: Medicare Other | Attending: Hematology and Oncology | Admitting: Physical Therapy

## 2021-03-19 DIAGNOSIS — G629 Polyneuropathy, unspecified: Secondary | ICD-10-CM | POA: Insufficient documentation

## 2021-03-19 DIAGNOSIS — R2689 Other abnormalities of gait and mobility: Secondary | ICD-10-CM | POA: Diagnosis not present

## 2021-03-19 DIAGNOSIS — Z170 Estrogen receptor positive status [ER+]: Secondary | ICD-10-CM | POA: Diagnosis not present

## 2021-03-19 DIAGNOSIS — M79671 Pain in right foot: Secondary | ICD-10-CM | POA: Diagnosis not present

## 2021-03-19 DIAGNOSIS — M79672 Pain in left foot: Secondary | ICD-10-CM | POA: Diagnosis not present

## 2021-03-19 DIAGNOSIS — R293 Abnormal posture: Secondary | ICD-10-CM | POA: Insufficient documentation

## 2021-03-19 DIAGNOSIS — C50312 Malignant neoplasm of lower-inner quadrant of left female breast: Secondary | ICD-10-CM | POA: Insufficient documentation

## 2021-03-19 DIAGNOSIS — I480 Paroxysmal atrial fibrillation: Secondary | ICD-10-CM | POA: Diagnosis not present

## 2021-03-19 DIAGNOSIS — I4891 Unspecified atrial fibrillation: Secondary | ICD-10-CM | POA: Diagnosis not present

## 2021-03-19 LAB — BASIC METABOLIC PANEL
BUN/Creatinine Ratio: 43 — ABNORMAL HIGH (ref 12–28)
BUN: 28 mg/dL — ABNORMAL HIGH (ref 8–27)
CO2: 21 mmol/L (ref 20–29)
Calcium: 9.5 mg/dL (ref 8.7–10.3)
Chloride: 106 mmol/L (ref 96–106)
Creatinine, Ser: 0.65 mg/dL (ref 0.57–1.00)
Glucose: 96 mg/dL (ref 70–99)
Potassium: 4.1 mmol/L (ref 3.5–5.2)
Sodium: 139 mmol/L (ref 134–144)
eGFR: 95 mL/min/{1.73_m2} (ref >59)

## 2021-03-19 LAB — CBC WITH DIFFERENTIAL/PLATELET
Basophils Absolute: 0.1 10*3/uL (ref 0.0–0.2)
Basos: 1 %
EOS (ABSOLUTE): 0.3 10*3/uL (ref 0.0–0.4)
Eos: 5 %
Hematocrit: 43.8 % (ref 34.0–46.6)
Hemoglobin: 14.4 g/dL (ref 11.1–15.9)
Immature Grans (Abs): 0 10*3/uL (ref 0.0–0.1)
Immature Granulocytes: 0 %
Lymphocytes Absolute: 2 10*3/uL (ref 0.7–3.1)
Lymphs: 30 %
MCH: 27.9 pg (ref 26.6–33.0)
MCHC: 32.9 g/dL (ref 31.5–35.7)
MCV: 85 fL (ref 79–97)
Monocytes Absolute: 0.5 10*3/uL (ref 0.1–0.9)
Monocytes: 7 %
Neutrophils Absolute: 3.9 10*3/uL (ref 1.4–7.0)
Neutrophils: 57 %
Platelets: 254 10*3/uL (ref 150–450)
RBC: 5.16 x10E6/uL (ref 3.77–5.28)
RDW: 14.3 % (ref 11.7–15.4)
WBC: 6.8 10*3/uL (ref 3.4–10.8)

## 2021-03-19 NOTE — Therapy (Signed)
[facility] @ [place], [place], [address] Phone: [phone]   Fax:  [phone]  Physical Therapy Evaluation  Patient Details  Name: Lorraine [name] MRN: [id number] Date of Birth: Feb 01, 1951 Referring Provider (PT): Nicholas Lose, MD   Encounter Date: 03/19/2021   PT End of Session - 03/19/21 1330     Visit Number 1    Date for PT Re-Evaluation 05/14/21    Authorization Type Medicare Part A/B    Progress Note Due on Visit 10    PT Start Time 1228    PT Stop Time 1310    PT Time Calculation (min) 42 min    Activity Tolerance Patient tolerated treatment well    Behavior During Therapy [facility] for tasks assessed/performed             Past Medical History:  Diagnosis Date   Anxiety    Aortic atherosclerosis ([place]) 02/03/2018   Noted on CT Abd/Pelvis   AR (aortic regurgitation) 07/18/2017   Mild, noted on ECHO   Bilateral cataracts    Bilateral plantar fasciitis    Chronic knee pain    Chronic low back pain    Chronic lumbar radiculopathy 07/14/2017   Mild, L5, noted on electromyography   History of palpitations    2014-- low risk exercise tolerence test, no ischemi, PVC's (09-06-2012)   HSV-1 (herpes simplex virus 1) infection    Cold sore   Hx of colonic polyps    Hyperlipidemia    Insomnia    Lactose intolerance    Left nephrolithiasis 02/03/2018   Nonobstructing, noted on CT Abd/pelvis   Morton's metatarsalgia, neuralgia, or neuroma, bilateral    Osteoarthritis    knees and lumbar, feet   Osteopenia    Peripheral neuropathy    bilateral feet -- burning and stinging   PONV (postoperative nausea and vomiting)    severe   PVC's (premature ventricular contractions)    Systolic murmur    "Slight"   Vaginal cyst    Wears glasses     Past Surgical History:  Procedure Laterality Date   ANTERIOR CERVICAL DECOMP/DISCECTOMY FUSION  01/2014   C4 -- C6   CATARACT EXTRACTION W/ INTRAOCULAR LENS   IMPLANT, BILATERAL  2015   COLONOSCOPY  05/14/2015   CYSTO WITH HYDRODISTENSION N/A 04/14/2018   Procedure: CYSTOSCOPY/HYDRODISTENSION WITH INSTILLATION OF PYRIDIUM AND MARCAINE, BLADDER BIOPSY WITH FULGERATION 0.5 TO 2 CM;  Surgeon: [name], MD;  Location: [facility];  Service: Urology;  Laterality: N/A;   ELBOW DEBRIDEMENT Right 09/10/2010   and tendon debridement and radial tunnel release   EXCISION MORTON'S NEUROMA Bilateral left 02-20-2009/  right & left 05-08-2009   EXCISION VAGINAL CYST N/A 07/01/2016   Procedure: EXCISION VAGINAL CYST;  Surgeon: Tyson Dense, MD;  [facility];  Service: Gynecology;  Laterality: N/A;   PLANTAR FASCIA RELEASE Bilateral left 04-02-2010/  right 04-02-2016   TONSILLECTOMY AND ADENOIDECTOMY  child   TOTAL ABDOMINAL HYSTERECTOMY  1974   w/  Left salpingoophorectomy and Partial right salpingoophorectomy    There were no vitals filed for this visit.    Subjective Assessment - 03/19/21 1227     Subjective Pt referred to OPPT with history of Lt malignant neoplasm of Lt breast diagnosed in 2020.  Pt is in the COMET study where she is being watched and is currently stable.  Pt took Tamoxifen for a period of time but no longer is on  it due to side effects.  Pt is referred for evaluation for DN to address symptoms related to bil LE peripheral neuropathy affecting entire foot.  Pt is unable to wear a closed toe shoe so wears sandals.  Feet sting and burn, worse with standing.  Gabapentin x 1 mo which may help some.    Pertinent History stable mammogram Dec 2022, minor arthritist in feet, had lumbar injection last Fall and no help, has had 4 foot surgeries (Morton neuromas bil, plantar fascia surgeries on both feet), NCV test 2019 + lumbar radic L5 mild    How long can you walk comfortably? sometimes a challenge to step up onto curb, 5 min and symptoms worse    Currently in Pain? Yes    Pain Score 6    pain  ranges 3-8   Pain Location Foot    Pain Orientation Right;Left;Other (Comment)   entire foot   Pain Descriptors / Indicators Burning;Heaviness    Pain Type Chronic pain    Pain Radiating Towards entire foot bil    Pain Onset More than a month ago    Pain Frequency Constant    Aggravating Factors  being on feet, can't wear closed toed shoes                [facility] PT Assessment - 03/19/21 0001       Assessment   Medical Diagnosis C50.312,Z17.0 (ICD-10-CM) - Malignant neoplasm of lower-inner quadrant of left breast in female, estrogen receptor positive ([place]), peripheral neuropathy    Referring Provider (PT) Nicholas Lose, MD    Next MD Visit end of June    Prior Therapy not for DN      Precautions   Precautions None      Restrictions   Weight Bearing Restrictions No      Balance Screen   Has the patient fallen in the past 6 months No      [place] residence    Living Arrangements Alone    Type of [place] to enter    Entrance Stairs-Number of Steps [place] One level      Prior Function   Level of Independence Independent    Vocation Full time employment    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: chair yoga, water exercise      Cognition   Overall Cognitive Status Within Functional Limits for tasks assessed      Sensation   Light Touch Impaired by gross assessment    Additional Comments bil feet stocking distribution, below ankle      Functional Tests   Functional tests Other      Other:   Other/ Comments high rigid arch with squat and standing trunk rotation, lacks pronation shock absorption      Posture/Postural Control   Posture/Postural Control Postural limitations    Postural Limitations Increased thoracic kyphosis;Increased lumbar lordosis      ROM / Strength   AROM / PROM / Strength AROM;PROM;Strength      AROM   Overall  AROM Comments trunk ROM WNL and painfree      PROM   Overall PROM Comments bil hips WNL and painfree      Strength   Overall Strength Comments bil ankles 5/5, pain in feet on heel raise bil      Flexibility   Soft Tissue Assessment /  Muscle Length yes   gastroc, soleus, posterior tib bil     Palpation   Spinal mobility hypomobile bil midfoot mobility, hypomobile subtalar joints bil    Palpation comment bil post tibialis, medial gastroc, lateral gastroc, peroneals                        Objective measurements completed on examination: See above findings.       [facility] Adult PT Treatment/Exercise - 03/19/21 0001       Self-Care   Self-Care Other Self-Care Comments    Other Self-Care Comments  DN info                     PT Education - 03/19/21 1324     Education Details DN info    Person(s) Educated Patient    Methods Explanation;Handout    Comprehension Verbalized understanding              PT Short Term Goals - 03/19/21 1324       PT SHORT TERM GOAL #1   Title STG = LTG               PT Long Term Goals - 03/19/21 1324       PT LONG TERM GOAL #1   Title Pt will report at least 50% reduction in frequency and intensity of bil foot pain and burning.    Time 8    Period Weeks    Status New    Target Date 05/14/21      PT LONG TERM GOAL #2   Title Pt will be able to stand and walk for at least 15 min before increase in bil foot symptoms    Time 8    Period Weeks    Status New    Target Date 05/14/21      PT LONG TERM GOAL #3   Title Pt will display improved midfoot and subtalar mobility in bil feet allowing pronation for improved shock absorption with gait.    Time 8    Period Weeks    Status New    Target Date 05/14/21      PT LONG TERM GOAL #4   Title Pt will be ind with HEP for foot, ankle and leg stretches and mobility self-care to maintain gains made in PT.    Time 8    Period Weeks    Status New    Target Date  05/14/21                    Plan - 03/19/21 1332     Clinical Impression Statement Pt is a pleasant 71yo female with history of bil LE peripheral neuropathy affecting bil feet.  She has constant pain and burning in bil feet in stocking distribution.  She has a history of multiple bil foot surgeries to plantar fascia and Morton's neuromas.  She recently had Lt posterior tib tendonitis (Nov 2022).  She is unable to wear close toed shoes secondary to foot pain.  Pain is constant and ranges from 3-8/10, worse with standing and walking after 5-6 min.  Pt is referred specifically for DN to see if this helps reduce her pain experience.  Pt presents with hypomobile rigid midfoot with high arch and hypomobile subtalar joints bil.  She has abnormal antalgic gait without pronation in midstance for shock absorption.  She has flexibility restrictions in bil gastroc/soleus and posterior tibialis, all of which  are tender with TPs.  Feet are globally tender.  Ankle strength is 5/5 bil.  Trunk and hip ROM are all WNL and painfree.  PT recommends DN and foot mobilization with education on stretches 1x/week x 6 weeks.    Personal Factors and Comorbidities Time since onset of injury/illness/exacerbation;Comorbidity 1    Comorbidities stable Lt breast cancer, no treatment to date, multiple foot surgeries bil    Examination-Activity Limitations Locomotion Level;Stand;Stairs    Examination-Participation Restrictions Community Activity;Shop;Cleaning    Stability/Clinical Decision Making Stable/Uncomplicated    Clinical Decision Making Low    Rehab Potential Good    PT Frequency 1x / week    PT Duration 8 weeks    PT Treatment/Interventions ADLs/Self Care Home Management;Dry needling;Passive range of motion;Joint Manipulations;Manual techniques;Therapeutic exercise;Therapeutic activities;Functional mobility training;Stair training;Taping;Neuromuscular re-education    PT Next Visit Plan DN bil calves, feet    PT  Home Exercise Plan begin [place] for calf stretches    Consulted and Agree with Plan of Care Patient             Patient will benefit from skilled therapeutic intervention in order to improve the following deficits and impairments:  Decreased range of motion, Increased fascial restricitons, Pain, Increased muscle spasms, Difficulty walking, Decreased activity tolerance, Hypomobility, Impaired flexibility, Impaired sensation, Postural dysfunction  Visit Diagnosis: Pain in right foot - Plan: PT plan of care cert/re-cert  Pain in left foot - Plan: PT plan of care cert/re-cert  Abnormal posture - Plan: PT plan of care cert/re-cert  Other abnormalities of gait and mobility - Plan: PT plan of care cert/re-cert     Problem List Patient Active Problem List   Diagnosis Date Noted   Prolapse of female genital organs 07/09/2020   Chronic pain of both shoulders 11/28/2019   Eustachian tube dysfunction, bilateral 11/05/2019   Acquired thrombophilia ([place]) 03/08/2019   Malignant neoplasm of lower-inner quadrant of left breast in female, estrogen receptor positive ([place]) 07/26/2018   Anxiety about health 05/31/2018   Paroxysmal atrial fibrillation ([place]) 05/18/2018   Overweight (BMI 25.0-29.9) 13/10/6576   Systolic murmur 46/96/2952   Shingles 11/23/2016   Arthritis 05/26/2016   Insomnia 05/26/2016   Neuropathy 05/26/2016   Positive ANA (antinuclear antibody) 05/26/2016   Hyperlipidemia 05/26/2016   Neck and shoulder pain 01/26/2014   Cervicalgia 01/26/2014   Palpitations 08/31/2012   METATARSALGIA 05/06/2010   PLANTAR FASCIITIS, LEFT 05/06/2010   FOOT PAIN, BILATERAL 05/06/2010    Baruch Merl, PT 03/19/21 1:42 PM   [facility] @ [place] [place], [place], [address] Phone: [phone]   Fax:  [phone]  Name: DELITA CHIQUITO MRN: [id number] Date of Birth: 05/26/1950

## 2021-03-19 NOTE — Patient Instructions (Addendum)

## 2021-03-23 ENCOUNTER — Ambulatory Visit: Payer: Medicare Other

## 2021-03-23 ENCOUNTER — Other Ambulatory Visit: Payer: Self-pay

## 2021-03-23 DIAGNOSIS — M79672 Pain in left foot: Secondary | ICD-10-CM | POA: Diagnosis not present

## 2021-03-23 DIAGNOSIS — C50312 Malignant neoplasm of lower-inner quadrant of left female breast: Secondary | ICD-10-CM | POA: Diagnosis not present

## 2021-03-23 DIAGNOSIS — R2689 Other abnormalities of gait and mobility: Secondary | ICD-10-CM | POA: Diagnosis not present

## 2021-03-23 DIAGNOSIS — M79671 Pain in right foot: Secondary | ICD-10-CM

## 2021-03-23 DIAGNOSIS — R293 Abnormal posture: Secondary | ICD-10-CM | POA: Diagnosis not present

## 2021-03-23 DIAGNOSIS — Z170 Estrogen receptor positive status [ER+]: Secondary | ICD-10-CM | POA: Diagnosis not present

## 2021-03-23 NOTE — Therapy (Signed)
[facility] @ [place], [place], [address] Phone: [phone]   Fax:  [phone]  Physical Therapy Treatment  Patient Details  Name: Lorraine [name] MRN: [id number] Date of Birth: [birth year] Referring Provider (Lorraine [name]): Nicholas Lose, MD   Encounter Date: 03/23/2021   Lorraine [name] End of Session - 03/23/21 1014     Visit Number 2    Date for Lorraine [name] Re-Evaluation 05/14/21    Authorization Type Medicare Part A/B    Progress Note Due on Visit 10    Lorraine [name] Start Time 0933    Lorraine [name] Stop Time 1010    Lorraine [name] Time Calculation (min) 37 min    Activity Tolerance Patient tolerated treatment well    Behavior During Therapy [facility] for tasks assessed/performed             Past Medical History:  Diagnosis Date   Anxiety    Aortic atherosclerosis ([place]) 02/03/2018   Noted on CT Abd/Pelvis   AR (aortic regurgitation) 07/18/2017   Mild, noted on ECHO   Bilateral cataracts    Bilateral plantar fasciitis    Chronic knee pain    Chronic low back pain    Chronic lumbar radiculopathy 07/14/2017   Mild, L5, noted on electromyography   History of palpitations    2014-- low risk exercise tolerence test, no ischemi, PVC's (09-06-2012)   HSV-1 (herpes simplex virus 1) infection    Cold sore   Hx of colonic polyps    Hyperlipidemia    Insomnia    Lactose intolerance    Left nephrolithiasis 02/03/2018   Nonobstructing, noted on CT Abd/pelvis   Morton's metatarsalgia, neuralgia, or neuroma, bilateral    Osteoarthritis    knees and lumbar, feet   Osteopenia    Peripheral neuropathy    bilateral feet -- burning and stinging   PONV (postoperative nausea and vomiting)    severe   PVC's (premature ventricular contractions)    Systolic murmur    "Slight"   Vaginal cyst    Wears glasses     Past Surgical History:  Procedure Laterality Date   ANTERIOR CERVICAL DECOMP/DISCECTOMY FUSION  01/2014   C4 -- C6   CATARACT EXTRACTION W/ INTRAOCULAR LENS   IMPLANT, BILATERAL  2015   COLONOSCOPY  05/14/2015   CYSTO WITH HYDRODISTENSION N/A 04/14/2018   Procedure: CYSTOSCOPY/HYDRODISTENSION WITH INSTILLATION OF PYRIDIUM AND MARCAINE, BLADDER BIOPSY WITH FULGERATION 0.5 TO 2 CM;  Surgeon: [name], MD;  Location: [facility];  Service: Urology;  Laterality: N/A;   ELBOW DEBRIDEMENT Right 09/10/2010   and tendon debridement and radial tunnel release   EXCISION MORTON'S NEUROMA Bilateral left 02-20-2009/  right & left 05-08-2009   EXCISION VAGINAL CYST N/A 07/01/2016   Procedure: EXCISION VAGINAL CYST;  Surgeon: Tyson Dense, MD;  Location: [facility];  Service: Gynecology;  Laterality: N/A;   PLANTAR FASCIA RELEASE Bilateral left 04-02-2010/  right 04-02-2016   TONSILLECTOMY AND ADENOIDECTOMY  child   TOTAL ABDOMINAL HYSTERECTOMY  1974   w/  Left salpingoophorectomy and Partial right salpingoophorectomy    There were no vitals filed for this visit.   Subjective Assessment - 03/23/21 0933     Subjective I want to try needling for my foot and leg neuropathy.  Things are feeling a little better today than last time that I was here.    Pain Score 5     Pain Location Foot    Pain Orientation Right;Left  Pain Descriptors / Indicators Burning    Pain Type Chronic pain    Pain Onset More than a month ago    Pain Frequency Constant    Aggravating Factors  uneven surfaces, being on feet                               [facility] Adult Lorraine [name] Treatment/Exercise - 03/23/21 0001       Manual Therapy   Manual Therapy Soft tissue mobilization    Manual therapy comments skilled palpation and monotoring during DN    Soft tissue mobilization elongation to bil calf and feet, metatarsal and calcaneous mobs              Trigger Point Dry Needling - 03/23/21 0001     Consent Given? Yes    Education Handout Provided Previously provided    Muscles Treated Lower Quadrant  Gastrocnemius;Soleus;Quadratus plantae;Flexor Digitorum Longus    Quadatus plantae response Twitch response elicited;Palpable increased muscle length    Gastrocnemius Response Twitch response elicited;Palpable increased muscle length    Soleus Response Twitch response elicited;Palpable increased muscle length    Flexor digitorum longus Response Twitch response elicited;Palpable increased muscle length                     Lorraine [name] Short Term Goals - 03/19/21 1324       Lorraine [name] SHORT TERM GOAL #1   Title STG = LTG               Lorraine [name] Long Term Goals - 03/19/21 1324       Lorraine [name] LONG TERM GOAL #1   Title Lorraine [name] will report at least 50% reduction in frequency and intensity of bil foot pain and burning.    Time 8    Period Weeks    Status New    Target Date 05/14/21      Lorraine [name] LONG TERM GOAL #2   Title Lorraine [name] will be able to stand and walk for at least 15 min before increase in bil foot symptoms    Time 8    Period Weeks    Status New    Target Date 05/14/21      Lorraine [name] LONG TERM GOAL #3   Title Lorraine [name] will display improved midfoot and subtalar mobility in bil feet allowing pronation for improved shock absorption with gait.    Time 8    Period Weeks    Status New    Target Date 05/14/21      Lorraine [name] LONG TERM GOAL #4   Title Lorraine [name] will be ind with HEP for foot, ankle and leg stretches and mobility self-care to maintain gains made in Lorraine [name].    Time 8    Period Weeks    Status New    Target Date 05/14/21                   Plan - 03/23/21 1013     Clinical Impression Statement First time follow-up after evaluation.  Session focused on DN to gastroc and bil feet with mobilization and stretching after.  Lorraine [name] with good response to DN and elongation of tissues after.  Lorraine [name] educated Lorraine [name] continue to stretch at home and use massager for tissue mobility to calves and feet.  Lorraine [name] with chronic foot dysfunction and neuropathy and will benefit from skilled Lorraine [name] to improve tissue mobility and bloodflow to this region.     Lorraine [name] Frequency  1x / week    Lorraine [name] Duration 8 weeks    Lorraine [name] Treatment/Interventions ADLs/Self Care Home Management;Dry needling;Passive range of motion;Joint Manipulations;Manual techniques;Therapeutic exercise;Therapeutic activities;Functional mobility training;Stair training;Taping;Neuromuscular re-education    Lorraine [name] Next Visit Plan DN bil calves, feet, add stretching and towel curls to HEP    Lorraine [name] Home Exercise Plan begin [place] for calf stretches    Consulted and Agree with Plan of Care Patient             Patient will benefit from skilled therapeutic intervention in order to improve the following deficits and impairments:  Decreased range of motion, Increased fascial restricitons, Pain, Increased muscle spasms, Difficulty walking, Decreased activity tolerance, Hypomobility, Impaired flexibility, Impaired sensation, Postural dysfunction  Visit Diagnosis: Pain in right foot  Pain in left foot     Problem List Patient Active Problem List   Diagnosis Date Noted   Prolapse of female genital organs 07/09/2020   Chronic pain of both shoulders 11/28/2019   Eustachian tube dysfunction, bilateral 11/05/2019   Acquired thrombophilia ([place]) 03/08/2019   Malignant neoplasm of lower-inner quadrant of left breast in female, estrogen receptor positive ([place]) 07/26/2018   Anxiety about health 05/31/2018   Paroxysmal atrial fibrillation ([place]) 05/18/2018   Overweight (BMI 25.0-29.9) 56/97/9480   Systolic murmur 16/55/3748   Shingles 11/23/2016   Arthritis 05/26/2016   Insomnia 05/26/2016   Neuropathy 05/26/2016   Positive ANA (antinuclear antibody) 05/26/2016   Hyperlipidemia 05/26/2016   Neck and shoulder pain 01/26/2014   Cervicalgia 01/26/2014   Palpitations 08/31/2012   METATARSALGIA 05/06/2010   PLANTAR FASCIITIS, LEFT 05/06/2010   FOOT PAIN, BILATERAL 05/06/2010   Lorraine [name], Lorraine [name] 03/23/21 10:15 AM   [facility] @ [place]  [place], [place], [address] Phone: [phone]   Fax:  [phone]  Name: Lorraine [name] MRN: [id number] Date of Birth: 12-13-1950

## 2021-04-01 ENCOUNTER — Ambulatory Visit: Payer: Medicare Other

## 2021-04-01 ENCOUNTER — Other Ambulatory Visit: Payer: Self-pay

## 2021-04-01 ENCOUNTER — Telehealth (HOSPITAL_COMMUNITY): Payer: Self-pay | Admitting: *Deleted

## 2021-04-01 DIAGNOSIS — Z170 Estrogen receptor positive status [ER+]: Secondary | ICD-10-CM | POA: Diagnosis not present

## 2021-04-01 DIAGNOSIS — M79672 Pain in left foot: Secondary | ICD-10-CM | POA: Diagnosis not present

## 2021-04-01 DIAGNOSIS — R2689 Other abnormalities of gait and mobility: Secondary | ICD-10-CM | POA: Diagnosis not present

## 2021-04-01 DIAGNOSIS — M79671 Pain in right foot: Secondary | ICD-10-CM | POA: Diagnosis not present

## 2021-04-01 DIAGNOSIS — R293 Abnormal posture: Secondary | ICD-10-CM | POA: Diagnosis not present

## 2021-04-01 DIAGNOSIS — C50312 Malignant neoplasm of lower-inner quadrant of left female breast: Secondary | ICD-10-CM | POA: Diagnosis not present

## 2021-04-01 NOTE — Telephone Encounter (Signed)
Patient returning call regarding upcoming cardiac imaging study; pt verbalizes understanding of appt date/time, parking situation and where to check in, pre-test NPO status and verified current allergies; name and call back number provided for further questions should they arise  Gordy Clement RN Navigator Cardiac Imaging [facility] [phone] office [phone] cell  Patient to arrive at 7:30am for her 8:30am cardiac CT scan.

## 2021-04-01 NOTE — Telephone Encounter (Signed)
Attempted to call patient regarding upcoming cardiac CT appointment. °Left message on voicemail with name and callback number ° °Lathen Seal RN Navigator Cardiac Imaging °[facility] °[phone] Office °[phone] Cell ° °

## 2021-04-01 NOTE — Therapy (Signed)
[facility] @ [address] [place], [place], [address] Phone: [phone]   Fax:  [phone]  Physical Therapy Treatment  Patient Details  Name: Lorraine [name] MRN: [id number] Date of Birth: Dec 05, 1950 Referring Provider (PT): Nicholas Lose, MD   Encounter Date: 04/01/2021   PT End of Session - 04/01/21 1102     Visit Number 3    Date for PT Re-Evaluation 05/14/21    Authorization Type Medicare Part A/B    Progress Note Due on Visit 10    PT Start Time 1016    PT Stop Time 1057    PT Time Calculation (min) 41 min    Activity Tolerance Patient tolerated treatment well    Behavior During Therapy Lorraine [name] for tasks assessed/performed             Past Medical History:  Diagnosis Date   Anxiety    Aortic atherosclerosis ([place]) 02/03/2018   Noted on CT Abd/Pelvis   AR (aortic regurgitation) 07/18/2017   Mild, noted on ECHO   Bilateral cataracts    Bilateral plantar fasciitis    Chronic knee pain    Chronic low back pain    Chronic lumbar radiculopathy 07/14/2017   Mild, L5, noted on electromyography   History of palpitations    2014-- low risk exercise tolerence test, no ischemi, PVC's (09-06-2012)   HSV-1 (herpes simplex virus 1) infection    Cold sore   Hx of colonic polyps    Hyperlipidemia    Insomnia    Lactose intolerance    Left nephrolithiasis 02/03/2018   Nonobstructing, noted on CT Abd/pelvis   Morton's metatarsalgia, neuralgia, or neuroma, bilateral    Osteoarthritis    knees and lumbar, feet   Osteopenia    Peripheral neuropathy    bilateral feet -- burning and stinging   PONV (postoperative nausea and vomiting)    severe   PVC's (premature ventricular contractions)    Systolic murmur    "Slight"   Vaginal cyst    Wears glasses     Past Surgical History:  Procedure Laterality Date   ANTERIOR CERVICAL DECOMP/DISCECTOMY FUSION  01/2014   C4 -- C6   CATARACT EXTRACTION W/ INTRAOCULAR LENS   IMPLANT, BILATERAL  2015   COLONOSCOPY  05/14/2015   CYSTO WITH HYDRODISTENSION N/A 04/14/2018   Procedure: CYSTOSCOPY/HYDRODISTENSION WITH INSTILLATION OF PYRIDIUM AND MARCAINE, BLADDER BIOPSY WITH FULGERATION 0.5 TO 2 CM;  Surgeon: [name], MD;  Location: [facility];  Service: Urology;  Laterality: N/A;   ELBOW DEBRIDEMENT Right 09/10/2010   and tendon debridement and radial tunnel release   EXCISION MORTON'S NEUROMA Bilateral left 02-20-2009/  right & left 05-08-2009   EXCISION VAGINAL CYST N/A 07/01/2016   Procedure: EXCISION VAGINAL CYST;  Surgeon: Tyson Dense, MD;  Location: [facility];  Service: Gynecology;  Laterality: N/A;   PLANTAR FASCIA RELEASE Bilateral left 04-02-2010/  right 04-02-2016   TONSILLECTOMY AND ADENOIDECTOMY  child   TOTAL ABDOMINAL HYSTERECTOMY  1974   w/  Left salpingoophorectomy and Partial right salpingoophorectomy    There were no vitals filed for this visit.   Subjective Assessment - 04/01/21 1019     Subjective I didn't see any changes in my LE symptoms.  I had soreness Lt>Rt gastroc.    Currently in Pain? Yes    Pain Score 5     Pain Location Foot    Pain Orientation Right;Left    Pain Descriptors /  Indicators Burning    Pain Type Chronic pain    Pain Onset 1 to 4 weeks ago    Pain Frequency Constant    Aggravating Factors  uneven surfaces, being on feet                               [facility] Adult PT Treatment/Exercise - 04/01/21 0001       Manual Therapy   Manual Therapy Soft tissue mobilization    Manual therapy comments skilled palpation and monotoring during DN    Soft tissue mobilization elongation to bil calf and feet, metatarsal and calcaneous mobs              Trigger Point Dry Needling - 04/01/21 0001     Consent Given? Yes    Education Handout Provided Previously provided    Muscles Treated Lower Quadrant Gastrocnemius;Soleus;Quadratus plantae    Quadatus  plantae response Twitch response elicited;Palpable increased muscle length    Gastrocnemius Response Twitch response elicited;Palpable increased muscle length    Soleus Response Twitch response elicited;Palpable increased muscle length    Flexor digitorum longus Response --                     PT Short Term Goals - 03/19/21 1324       PT SHORT TERM GOAL #1   Title STG = LTG               PT Long Term Goals - 03/19/21 1324       PT LONG TERM GOAL #1   Title Pt will report at least 50% reduction in frequency and intensity of bil foot pain and burning.    Time 8    Period Weeks    Status New    Target Date 05/14/21      PT LONG TERM GOAL #2   Title Pt will be able to stand and walk for at least 15 min before increase in bil foot symptoms    Time 8    Period Weeks    Status New    Target Date 05/14/21      PT LONG TERM GOAL #3   Title Pt will display improved midfoot and subtalar mobility in bil feet allowing pronation for improved shock absorption with gait.    Time 8    Period Weeks    Status New    Target Date 05/14/21      PT LONG TERM GOAL #4   Title Pt will be ind with HEP for foot, ankle and leg stretches and mobility self-care to maintain gains made in PT.    Time 8    Period Weeks    Status New    Target Date 05/14/21                   Plan - 04/01/21 1109     Clinical Impression Statement Pt denies any changes in symptoms since the start of care. Session focused on DN to gastroc and bil feet with mobilization and stretching after.  Pt with good response to DN and elongation of tissues after.  PT educated pt continue to stretch at home and use massager for tissue mobility to calves and feet.   Pt was very sensitive to DN today with burning response.  Pt with chronic foot dysfunction and neuropathy and will benefit from skilled PT to improve tissue mobility and bloodflow to this  region.    PT Frequency 1x / week    PT Duration 8 weeks     PT Treatment/Interventions ADLs/Self Care Home Management;Dry needling;Passive range of motion;Joint Manipulations;Manual techniques;Therapeutic exercise;Therapeutic activities;Functional mobility training;Stair training;Taping;Neuromuscular re-education    PT Next Visit Plan DN bil calves, feet, add stretching and towel curls to HEP    PT Home Exercise Plan begin [place] for calf stretches    Consulted and Agree with Plan of Care Patient             Patient will benefit from skilled therapeutic intervention in order to improve the following deficits and impairments:  Decreased range of motion, Increased fascial restricitons, Pain, Increased muscle spasms, Difficulty walking, Decreased activity tolerance, Hypomobility, Impaired flexibility, Impaired sensation, Postural dysfunction  Visit Diagnosis: Pain in right foot  Pain in left foot     Problem List Patient Active Problem List   Diagnosis Date Noted   Prolapse of female genital organs 07/09/2020   Chronic pain of both shoulders 11/28/2019   Eustachian tube dysfunction, bilateral 11/05/2019   Acquired thrombophilia ([place]) 03/08/2019   Malignant neoplasm of lower-inner quadrant of left breast in female, estrogen receptor positive ([place]) 07/26/2018   Anxiety about health 05/31/2018   Paroxysmal atrial fibrillation ([place]) 05/18/2018   Overweight (BMI 25.0-29.9) 86/57/8469   Systolic murmur 62/95/2841   Shingles 11/23/2016   Arthritis 05/26/2016   Insomnia 05/26/2016   Neuropathy 05/26/2016   Positive ANA (antinuclear antibody) 05/26/2016   Hyperlipidemia 05/26/2016   Neck and shoulder pain 01/26/2014   Cervicalgia 01/26/2014   Palpitations 08/31/2012   METATARSALGIA 05/06/2010   PLANTAR FASCIITIS, LEFT 05/06/2010   FOOT PAIN, BILATERAL 05/06/2010   Sigurd Sos, PT 04/01/21 11:10 AM   [facility] @ [place], [place], [address] Phone: [phone]    Fax:  [phone]  Name: Lorraine [name] MRN: [id number] Date of Birth: 1950-05-25

## 2021-04-02 ENCOUNTER — Ambulatory Visit (HOSPITAL_COMMUNITY)
Admission: RE | Admit: 2021-04-02 | Discharge: 2021-04-02 | Disposition: A | Discharge status: Home / Self Care | Payer: Medicare Other | Source: Ambulatory Visit | Attending: Cardiology | Admitting: Cardiology

## 2021-04-02 ENCOUNTER — Encounter (HOSPITAL_COMMUNITY): Payer: Self-pay

## 2021-04-02 DIAGNOSIS — I4891 Unspecified atrial fibrillation: Secondary | ICD-10-CM | POA: Diagnosis not present

## 2021-04-02 MED ORDER — METOPROLOL TARTRATE 5 MG/5ML IV SOLN
INTRAVENOUS | Status: AC
  Administered 2021-04-02: 5 mg via INTRAVENOUS
  Filled 2021-04-02: qty 5

## 2021-04-02 MED ORDER — METOPROLOL TARTRATE 5 MG/5ML IV SOLN: 5.0000 mg | Freq: Once | INTRAVENOUS | Status: AC

## 2021-04-02 NOTE — Progress Notes (Signed)
Patient states she was in Atrial fibrillation about 150. Denies chest pain and SOB, color pink and skin color pink. Flips into sinus rhythm rate 45 to 50 and then back into atrial fib rate 100's to 120's Britanny CT tech over to check rhythm. Spoke with Dr Audie Box who would like her to have 5 mg of Metoprolol IV despite her heart rate of 45-50 when in sinus. Her baseline is 50's as per patient. Took Metoprolol 25mg  this am as in rapid atrial fib.

## 2021-04-02 NOTE — Progress Notes (Signed)
Gave 5Mg  of Metoprolol prior to attempting scan. Did not change heart rate post 5 minutes. BP at 0838 134/69 Heart rate 100's to 130's. As per CT tech unable to scan with this heart rate. Dr Audie Box notified and scan cancelled. Explained to patient. IV d/c and patient discharged walking. Feels fine. Gordy Clement RN notified re this, to let Dr Quentin Ore know.

## 2021-04-03 ENCOUNTER — Telehealth (HOSPITAL_COMMUNITY): Payer: Self-pay | Admitting: *Deleted

## 2021-04-03 NOTE — Telephone Encounter (Signed)
Patient returning call regarding upcoming cardiac imaging study; pt verbalizes understanding of appt date/time, parking situation and where to check in, pre-test NPO status, and verified current allergies; name and call back number provided for further questions should they arise  Gordy Clement RN Navigator Cardiac [facility] [phone] office [phone] cell  Patient aware to arrive at 1pm for her 1:30pm scan.

## 2021-04-03 NOTE — Telephone Encounter (Signed)
Attempted to call patient regarding upcoming cardiac CT appointment. °Left message on voicemail with name and callback number ° °Shayley Medlin RN Navigator Cardiac Imaging °[facility] °[phone] Office °[phone] Cell ° °

## 2021-04-06 ENCOUNTER — Ambulatory Visit (HOSPITAL_COMMUNITY)
Admission: RE | Admit: 2021-04-06 | Discharge: 2021-04-06 | Disposition: A | Discharge status: Home / Self Care | Payer: Medicare Other | Source: Ambulatory Visit | Attending: Cardiology | Admitting: Cardiology

## 2021-04-06 ENCOUNTER — Other Ambulatory Visit: Payer: Self-pay

## 2021-04-06 DIAGNOSIS — I4891 Unspecified atrial fibrillation: Secondary | ICD-10-CM | POA: Diagnosis not present

## 2021-04-06 MED ORDER — IOHEXOL 350 MG/ML SOLN
80.0000 mL | Freq: Once | INTRAVENOUS | Status: AC | PRN
  Administered 2021-04-06: 80 mL via INTRAVENOUS

## 2021-04-07 ENCOUNTER — Ambulatory Visit: Payer: Medicare Other

## 2021-04-07 DIAGNOSIS — M79671 Pain in right foot: Secondary | ICD-10-CM | POA: Diagnosis not present

## 2021-04-07 DIAGNOSIS — R2689 Other abnormalities of gait and mobility: Secondary | ICD-10-CM

## 2021-04-07 DIAGNOSIS — C50312 Malignant neoplasm of lower-inner quadrant of left female breast: Secondary | ICD-10-CM | POA: Diagnosis not present

## 2021-04-07 DIAGNOSIS — M79672 Pain in left foot: Secondary | ICD-10-CM

## 2021-04-07 DIAGNOSIS — Z170 Estrogen receptor positive status [ER+]: Secondary | ICD-10-CM | POA: Diagnosis not present

## 2021-04-07 DIAGNOSIS — R293 Abnormal posture: Secondary | ICD-10-CM | POA: Diagnosis not present

## 2021-04-07 NOTE — Therapy (Signed)
[facility] @ [address] [place], [place], [address] Phone: [phone]   Fax:  [phone]  Physical Therapy Treatment  Patient Details  Name: Lorraine [name] MRN: [id number] Date of Birth: 15-Feb-1951 Referring Provider (PT): Nicholas Lose, MD   Encounter Date: 04/07/2021   PT End of Session - 04/07/21 0923     Visit Number 4    Date for PT Re-Evaluation 05/14/21    Authorization Type Medicare Part A/B    Progress Note Due on Visit 10    PT Start Time 0846    PT Stop Time 0920    PT Time Calculation (min) 34 min    Activity Tolerance Patient tolerated treatment well    Behavior During Therapy [facility] for tasks assessed/performed             Past Medical History:  Diagnosis Date   Anxiety    Aortic atherosclerosis ([place]) 02/03/2018   Noted on CT Abd/Pelvis   AR (aortic regurgitation) 07/18/2017   Mild, noted on ECHO   Bilateral cataracts    Bilateral plantar fasciitis    Chronic knee pain    Chronic low back pain    Chronic lumbar radiculopathy 07/14/2017   Mild, L5, noted on electromyography   History of palpitations    2014-- low risk exercise tolerence test, no ischemi, PVC's (09-06-2012)   HSV-1 (herpes simplex virus 1) infection    Cold sore   Hx of colonic polyps    Hyperlipidemia    Insomnia    Lactose intolerance    Left nephrolithiasis 02/03/2018   Nonobstructing, noted on CT Abd/pelvis   Morton's metatarsalgia, neuralgia, or neuroma, bilateral    Osteoarthritis    knees and lumbar, feet   Osteopenia    Peripheral neuropathy    bilateral feet -- burning and stinging   PONV (postoperative nausea and vomiting)    severe   PVC's (premature ventricular contractions)    Systolic murmur    "Slight"   Vaginal cyst    Wears glasses     Past Surgical History:  Procedure Laterality Date   ANTERIOR CERVICAL DECOMP/DISCECTOMY FUSION  01/2014   C4 -- C6   CATARACT EXTRACTION W/ INTRAOCULAR LENS   IMPLANT, BILATERAL  2015   COLONOSCOPY  05/14/2015   CYSTO WITH HYDRODISTENSION N/A 04/14/2018   Procedure: CYSTOSCOPY/HYDRODISTENSION WITH INSTILLATION OF PYRIDIUM AND MARCAINE, BLADDER BIOPSY WITH FULGERATION 0.5 TO 2 CM;  Surgeon: [name], MD;  Location: [facility];  Service: Urology;  Laterality: N/A;   ELBOW DEBRIDEMENT Right 09/10/2010   and tendon debridement and radial tunnel release   EXCISION MORTON'S NEUROMA Bilateral left 02-20-2009/  right & left 05-08-2009   EXCISION VAGINAL CYST N/A 07/01/2016   Procedure: EXCISION VAGINAL CYST;  Surgeon: Tyson Dense, MD;  Location: [facility];  Service: Gynecology;  Laterality: N/A;   PLANTAR FASCIA RELEASE Bilateral left 04-02-2010/  right 04-02-2016   TONSILLECTOMY AND ADENOIDECTOMY  child   TOTAL ABDOMINAL HYSTERECTOMY  1974   w/  Left salpingoophorectomy and Partial right salpingoophorectomy    There were no vitals filed for this visit.   Subjective Assessment - 04/07/21 0847     Subjective Madaline Savage is still out if this is helping. Pain isn't as bad during the day.  Worse at night especially if i've been on my feet.    Patient Stated Goals reduce leg pain    Currently in Pain? Yes    Pain Score  2     Pain Location Foot    Pain Orientation Left;Right    Pain Descriptors / Indicators Burning    Pain Type Chronic pain    Pain Onset More than a month ago    Pain Frequency Constant    Aggravating Factors  being on feet, night    Pain Relieving Factors Gabapentin, during the day                               [facility] Adult PT Treatment/Exercise - 04/07/21 0001       Manual Therapy   Manual Therapy Soft tissue mobilization    Manual therapy comments skilled palpation and monotoring during DN    Soft tissue mobilization elongation to bil calf and feet, metatarsal and calcaneous mobs              Trigger Point Dry Needling - 04/07/21 0001     Consent Given? Yes     Education Handout Provided Previously provided    Muscles Treated Lower Quadrant Gastrocnemius;Soleus;Quadratus plantae    Quadatus plantae response Twitch response elicited;Palpable increased muscle length    Gastrocnemius Response Twitch response elicited;Palpable increased muscle length    Soleus Response Twitch response elicited;Palpable increased muscle length                     PT Short Term Goals - 03/19/21 1324       PT SHORT TERM GOAL #1   Title STG = LTG               PT Long Term Goals - 04/07/21 0849       PT LONG TERM GOAL #1   Title Pt will report at least 50% reduction in frequency and intensity of bil foot pain and burning.    Status On-going      PT LONG TERM GOAL #2   Title Pt will be able to stand and walk for at least 15 min before increase in bil foot symptoms    Status On-going      PT LONG TERM GOAL #4   Title Pt will be ind with HEP for foot, ankle and leg stretches and mobility self-care to maintain gains made in PT.    Status Achieved                   Plan - 04/07/21 0921     Clinical Impression Statement Pt denies any changes in symptoms since the start of care.  Pt does report less pain during the day overall but isnt sure if this is significantly improved. Session focused on DN to gastroc and bil feet with mobilization and stretching after.  Pt with good response to DN and elongation of tissues after.  PT educated pt continue to stretch at home and use massager for tissue mobility to calves and feet.   Pt was very sensitive to DN today with burning response, especially in the upper gastroc bil.  Pt with chronic foot dysfunction and neuropathy and will benefit from skilled PT to improve tissue mobility and bloodflow to this region.    PT Frequency 1x / week    PT Duration 8 weeks    PT Treatment/Interventions ADLs/Self Care Home Management;Dry needling;Passive range of motion;Joint Manipulations;Manual  techniques;Therapeutic exercise;Therapeutic activities;Functional mobility training;Stair training;Taping;Neuromuscular re-education    PT Next Visit Plan DN bil calves, feet,    Recommended Other Services initial  cert is signed    Consulted and Agree with Plan of Care Patient             Patient will benefit from skilled therapeutic intervention in order to improve the following deficits and impairments:  Decreased range of motion, Increased fascial restricitons, Pain, Increased muscle spasms, Difficulty walking, Decreased activity tolerance, Hypomobility, Impaired flexibility, Impaired sensation, Postural dysfunction  Visit Diagnosis: Pain in right foot  Pain in left foot  Other abnormalities of gait and mobility     Problem List Patient Active Problem List   Diagnosis Date Noted   Prolapse of female genital organs 07/09/2020   Chronic pain of both shoulders 11/28/2019   Eustachian tube dysfunction, bilateral 11/05/2019   Acquired thrombophilia ([place]) 03/08/2019   Malignant neoplasm of lower-inner quadrant of left breast in female, estrogen receptor positive ([place]) 07/26/2018   Anxiety about health 05/31/2018   Paroxysmal atrial fibrillation ([place]) 05/18/2018   Overweight (BMI 25.0-29.9) 16/12/9602   Systolic murmur 54/11/8117   Shingles 11/23/2016   Arthritis 05/26/2016   Insomnia 05/26/2016   Neuropathy 05/26/2016   Positive ANA (antinuclear antibody) 05/26/2016   Hyperlipidemia 05/26/2016   Neck and shoulder pain 01/26/2014   Cervicalgia 01/26/2014   Palpitations 08/31/2012   METATARSALGIA 05/06/2010   PLANTAR FASCIITIS, LEFT 05/06/2010   FOOT PAIN, BILATERAL 05/06/2010    Sigurd Sos, PT 04/07/21 9:24 AM  [facility] @ [address] [place], [place], [address] Phone: [phone]   Fax:  [phone]  Name: Lorraine [name] MRN: [id number] Date of Birth: 10-18-1950

## 2021-04-08 NOTE — Anesthesia Preprocedure Evaluation (Addendum)
Anesthesia Evaluation  Patient identified by MRN, date of birth, ID band Patient awake    Reviewed: Allergy & Precautions, NPO status , Patient's Chart, lab work & pertinent test results, reviewed documented beta blocker date and time   History of Anesthesia Complications (+) PONV and history of anesthetic complications  Airway Mallampati: II  TM Distance: >3 FB Neck ROM: Full    Dental  (+) Dental Advisory Given, Teeth Intact   Pulmonary neg pulmonary ROS,    Pulmonary exam normal breath sounds clear to auscultation       Cardiovascular hypertension, Pt. on home beta blockers + Valvular Problems/Murmurs AI  Rhythm:Regular Rate:Abnormal + Systolic murmurs Echo 40/7680 1. Left ventricular ejection fraction, by estimation, is 65 to 70%. The left ventricle has normal function. The left ventricle has no regional wall motion abnormalities. Left ventricular diastolic parameters were normal.  2. Right ventricular systolic function is normal. The right ventricular size is normal.  3. The mitral valve is normal in structure. Trivial mitral valve regurgitation.  4. The aortic valve is normal in structure. Aortic valve regurgitation is mild to moderate. No aortic stenosis is present.  5. Aortic dilatation noted. There is mild dilatation of the ascending aorta, measuring 41 mm.   '19 TTE - EF 55-60%, mild AI    Neuro/Psych PSYCHIATRIC DISORDERS Anxiety  Neuromuscular disease (neuropathy)    GI/Hepatic negative GI ROS, Neg liver ROS,   Endo/Other  negative endocrine ROS  Renal/GU Renal disease     Musculoskeletal  (+) Arthritis , Osteoarthritis,    Abdominal   Peds  Hematology negative hematology ROS (+)   Anesthesia Other Findings HSV-1  Reproductive/Obstetrics                           Anesthesia Physical  Anesthesia Plan  ASA: 3  Anesthesia Plan: General   Post-op Pain Management: Minimal  or no pain anticipated   Induction: Intravenous  PONV Risk Score and Plan: 4 or greater and Treatment may vary due to age or medical condition, Ondansetron, Midazolam and Dexamethasone  Airway Management Planned: Oral ETT  Additional Equipment: None  Intra-op Plan:   Post-operative Plan: Extubation in OR  Informed Consent: I have reviewed the patients History and Physical, chart, labs and discussed the procedure including the risks, benefits and alternatives for the proposed anesthesia with the patient or authorized representative who has indicated his/her understanding and acceptance.     Dental advisory given  Plan Discussed with: CRNA  Anesthesia Plan Comments:        Anesthesia Quick Evaluation

## 2021-04-08 NOTE — Pre-Procedure Instructions (Signed)
Instructed patient on the following items: Arrival time 0530 Nothing to eat or drink after midnight No meds AM of procedure Responsible person to drive you home and stay with you for 24 hrs  Have you missed any doses of anti-coagulant Xarelto- hasn't missed any doses    

## 2021-04-09 ENCOUNTER — Ambulatory Visit (HOSPITAL_COMMUNITY): Payer: Medicare Other | Admitting: Anesthesiology

## 2021-04-09 ENCOUNTER — Ambulatory Visit (HOSPITAL_COMMUNITY)
Admission: RE | Admit: 2021-04-09 | Discharge: 2021-04-09 | Disposition: A | Discharge status: Home / Self Care | Payer: Medicare Other | Attending: Cardiology | Admitting: Cardiology

## 2021-04-09 ENCOUNTER — Encounter (HOSPITAL_COMMUNITY)
Admission: RE | Disposition: A | Discharge status: Home / Self Care | Payer: Medicare Other | Source: Home / Self Care | Attending: Cardiology

## 2021-04-09 ENCOUNTER — Encounter (HOSPITAL_COMMUNITY): Payer: Self-pay | Admitting: Cardiology

## 2021-04-09 ENCOUNTER — Other Ambulatory Visit: Payer: Self-pay

## 2021-04-09 DIAGNOSIS — I480 Paroxysmal atrial fibrillation: Secondary | ICD-10-CM | POA: Insufficient documentation

## 2021-04-09 DIAGNOSIS — I4891 Unspecified atrial fibrillation: Secondary | ICD-10-CM | POA: Diagnosis not present

## 2021-04-09 DIAGNOSIS — I080 Rheumatic disorders of both mitral and aortic valves: Secondary | ICD-10-CM | POA: Diagnosis not present

## 2021-04-09 DIAGNOSIS — Z8673 Personal history of transient ischemic attack (TIA), and cerebral infarction without residual deficits: Secondary | ICD-10-CM | POA: Diagnosis not present

## 2021-04-09 DIAGNOSIS — G629 Polyneuropathy, unspecified: Secondary | ICD-10-CM | POA: Diagnosis not present

## 2021-04-09 DIAGNOSIS — I10 Essential (primary) hypertension: Secondary | ICD-10-CM | POA: Diagnosis not present

## 2021-04-09 DIAGNOSIS — G709 Myoneural disorder, unspecified: Secondary | ICD-10-CM | POA: Insufficient documentation

## 2021-04-09 DIAGNOSIS — E785 Hyperlipidemia, unspecified: Secondary | ICD-10-CM | POA: Diagnosis not present

## 2021-04-09 DIAGNOSIS — F419 Anxiety disorder, unspecified: Secondary | ICD-10-CM | POA: Diagnosis not present

## 2021-04-09 HISTORY — PX: ATRIAL FIBRILLATION ABLATION: EP1191

## 2021-04-09 LAB — POCT ACTIVATED CLOTTING TIME
Activated Clotting Time: 341 seconds
Activated Clotting Time: 353 seconds
Activated Clotting Time: 323 seconds

## 2021-04-09 SURGERY — ATRIAL FIBRILLATION ABLATION
Anesthesia: General

## 2021-04-09 MED ORDER — ONDANSETRON HCL 4 MG/2ML IJ SOLN
4.0000 mg | Freq: Four times a day (QID) | INTRAMUSCULAR | Status: DC | PRN
  Administered 2021-04-09: 4 mg via INTRAVENOUS

## 2021-04-09 MED ORDER — HEPARIN (PORCINE) IN NACL 1000-0.9 UT/500ML-% IV SOLN
INTRAVENOUS | Status: AC
  Filled 2021-04-09: qty 500

## 2021-04-09 MED ORDER — ONDANSETRON HCL 4 MG/2ML IJ SOLN
INTRAMUSCULAR | Status: AC
  Filled 2021-04-09: qty 2

## 2021-04-09 MED ORDER — ISOPROTERENOL HCL 0.2 MG/ML IJ SOLN
INTRAVENOUS | Status: DC | PRN
  Administered 2021-04-09: 4 ug/min via INTRAVENOUS

## 2021-04-09 MED ORDER — ISOPROTERENOL HCL 0.2 MG/ML IJ SOLN
INTRAMUSCULAR | Status: AC
  Filled 2021-04-09: qty 5

## 2021-04-09 MED ORDER — SODIUM CHLORIDE 0.9% FLUSH: 3.0000 mL | INTRAVENOUS | Status: DC | PRN

## 2021-04-09 MED ORDER — HEPARIN SODIUM (PORCINE) 1000 UNIT/ML IJ SOLN
INTRAMUSCULAR | Status: DC | PRN
  Administered 2021-04-09: 12000 [IU] via INTRAVENOUS
  Administered 2021-04-09: 2000 [IU] via INTRAVENOUS

## 2021-04-09 MED ORDER — SODIUM CHLORIDE 0.9 % IV SOLN: 250.0000 mL | INTRAVENOUS | Status: DC | PRN

## 2021-04-09 MED ORDER — ACETAMINOPHEN 325 MG PO TABS: 650.0000 mg | ORAL_TABLET | ORAL | Status: DC | PRN

## 2021-04-09 MED ORDER — SODIUM CHLORIDE 0.9 % IV SOLN: INTRAVENOUS | Status: DC

## 2021-04-09 MED ORDER — HEPARIN (PORCINE) IN NACL 1000-0.9 UT/500ML-% IV SOLN
INTRAVENOUS | Status: DC | PRN
  Administered 2021-04-09 (×4): 500 mL

## 2021-04-09 MED ORDER — HEPARIN SODIUM (PORCINE) 1000 UNIT/ML IJ SOLN
INTRAMUSCULAR | Status: AC
  Filled 2021-04-09: qty 10

## 2021-04-09 MED ORDER — ONDANSETRON HCL 4 MG/2ML IJ SOLN
INTRAMUSCULAR | Status: DC | PRN
Start: 2021-04-09 — End: 2021-04-09
  Administered 2021-04-09: 4 mg via INTRAVENOUS

## 2021-04-09 MED ORDER — FENTANYL CITRATE (PF) 250 MCG/5ML IJ SOLN
INTRAMUSCULAR | Status: DC | PRN
Start: 2021-04-09 — End: 2021-04-09
  Administered 2021-04-09 (×2): 25 ug via INTRAVENOUS
  Administered 2021-04-09 (×2): 50 ug via INTRAVENOUS

## 2021-04-09 MED ORDER — SODIUM CHLORIDE 0.9% FLUSH: 3.0000 mL | Freq: Two times a day (BID) | INTRAVENOUS | Status: DC

## 2021-04-09 MED ORDER — PROPOFOL 10 MG/ML IV BOLUS
INTRAVENOUS | Status: DC | PRN
Start: 2021-04-09 — End: 2021-04-09
  Administered 2021-04-09 (×2): 100 mg via INTRAVENOUS

## 2021-04-09 MED ORDER — PHENYLEPHRINE 40 MCG/ML (10ML) SYRINGE FOR IV PUSH (FOR BLOOD PRESSURE SUPPORT)
PREFILLED_SYRINGE | INTRAVENOUS | Status: DC | PRN
  Administered 2021-04-09 (×2): 40 ug via INTRAVENOUS

## 2021-04-09 MED ORDER — DEXAMETHASONE SODIUM PHOSPHATE 10 MG/ML IJ SOLN
INTRAMUSCULAR | Status: DC | PRN
  Administered 2021-04-09: 10 mg via INTRAVENOUS

## 2021-04-09 MED ORDER — SUGAMMADEX SODIUM 200 MG/2ML IV SOLN
INTRAVENOUS | Status: DC | PRN
  Administered 2021-04-09: 200 mg via INTRAVENOUS

## 2021-04-09 MED ORDER — ROCURONIUM BROMIDE 10 MG/ML (PF) SYRINGE
PREFILLED_SYRINGE | INTRAVENOUS | Status: DC | PRN
  Administered 2021-04-09: 80 mg via INTRAVENOUS

## 2021-04-09 MED ORDER — PHENYLEPHRINE HCL-NACL 20-0.9 MG/250ML-% IV SOLN
INTRAVENOUS | Status: DC | PRN
  Administered 2021-04-09: 25 ug/min via INTRAVENOUS

## 2021-04-09 MED ORDER — PROTAMINE SULFATE 10 MG/ML IV SOLN
INTRAVENOUS | Status: DC | PRN
  Administered 2021-04-09: 35 mg via INTRAVENOUS

## 2021-04-09 MED ORDER — HEPARIN SODIUM (PORCINE) 1000 UNIT/ML IJ SOLN
INTRAMUSCULAR | Status: DC | PRN
  Administered 2021-04-09: 1000 [IU] via INTRAVENOUS

## 2021-04-09 MED ORDER — LIDOCAINE 2% (20 MG/ML) 5 ML SYRINGE
INTRAMUSCULAR | Status: DC | PRN
Start: 2021-04-09 — End: 2021-04-09
  Administered 2021-04-09: 60 mg via INTRAVENOUS

## 2021-04-09 SURGICAL SUPPLY — 18 items
CATH 8FR REPROCESSED SOUNDSTAR (CATHETERS) ×2 IMPLANT
CATH 8FR SOUNDSTAR REPROCESSED (CATHETERS) IMPLANT
CATH OCTARAY 2.0 F 3-3-3-3-3 (CATHETERS) ×2 IMPLANT
CATH S CIRCA THERM PROBE 10F (CATHETERS) ×1 IMPLANT
CATH SMTCH THERMOCOOL SF DF (CATHETERS) ×1 IMPLANT
CATH WEBSTER BI DIR CS D-F CRV (CATHETERS) ×1 IMPLANT
CLOSURE PERCLOSE PROSTYLE (VASCULAR PRODUCTS) ×3 IMPLANT
COVER SWIFTLINK CONNECTOR (BAG) ×3 IMPLANT
PACK EP LATEX FREE (CUSTOM PROCEDURE TRAY) ×2
PACK EP LF (CUSTOM PROCEDURE TRAY) ×2 IMPLANT
PAD DEFIB RADIO PHYSIO CONN (PAD) ×3 IMPLANT
PATCH CARTO3 (PAD) ×1 IMPLANT
SHEATH BAYLIS TRANSSEPTAL 98CM (NEEDLE) ×1 IMPLANT
SHEATH CARTO VIZIGO SM CVD (SHEATH) ×1 IMPLANT
SHEATH PINNACLE 8F 10CM (SHEATH) ×2 IMPLANT
SHEATH PINNACLE 9F 10CM (SHEATH) ×1 IMPLANT
SHEATH PROBE COVER 6X72 (BAG) ×1 IMPLANT
TUBING SMART ABLATE COOLFLOW (TUBING) ×1 IMPLANT

## 2021-04-09 NOTE — H&P (Signed)
Electrophysiology Office Note:     Date:  04/09/2021    ID:  Lorraine [name], DOB [birth year], MRN [id number]   PCP:  [name], MD      [facility] Cardiologist:  [name], MD  [facility] Electrophysiologist:  Vickie Epley, MD    Referring MD: [name], MD    Chief Complaint: AF   History of Present Illness:     Lorraine [name] is a 71 y.o. female who presents for an evaluation of AF at the request of Lorraine [name]. Their medical history includes AR, AF w/ RVR, PVCs, HLD. She was seen by Lorraine [name] on 01/01/2021 for follow up.   At that appointment she reported 1-2 episodes of AF per week. She takes xarelto for stroke ppx. She is symptomatic with her AF.   Today she confirms the above.  She tells me that after a upper respiratory infection in September 2022 her episodes of atrial fibrillation became quite frequent.  She was taking metoprolol 3 times a day to try to manage the A. fib episodes.  He has not had an A. fib episode since October 20 but she is very concerned about her A. fib progressing the future.  She would like to avoid exposure to antiarrhythmic drugs.  She had a friend who I also see in clinic who experienced a side effect associated with antiarrhythmic drug therapy.  She is also interested in avoiding long-term exposure anticoagulation if possible.   04/09/2021 - today she tells me that she has experienced nearly daily AF episodes. Sometimes more than 1 per day. Last on average 30 min.     Objective        Past Medical History:  Diagnosis Date   Anxiety     Aortic atherosclerosis ([place]) 02/03/2018    Noted on CT Abd/Pelvis   AR (aortic regurgitation) 07/18/2017    Mild, noted on ECHO   Bilateral cataracts     Bilateral plantar fasciitis     Chronic knee pain     Chronic low back pain     Chronic lumbar radiculopathy 07/14/2017    Mild, L5, noted on electromyography   History of palpitations      2014-- low risk exercise tolerence  test, no ischemi, PVC's (09-06-2012)   HSV-1 (herpes simplex virus 1) infection      Cold sore   Hx of colonic polyps     Hyperlipidemia     Insomnia     Lactose intolerance     Left nephrolithiasis 02/03/2018    Nonobstructing, noted on CT Abd/pelvis   Morton's metatarsalgia, neuralgia, or neuroma, bilateral     Osteoarthritis      knees and lumbar, feet   Osteopenia     Peripheral neuropathy      bilateral feet -- burning and stinging   PONV (postoperative nausea and vomiting)      severe   PVC's (premature ventricular contractions)     Systolic murmur      "Slight"   Vaginal cyst     Wears glasses             Past Surgical History:  Procedure Laterality Date   ANTERIOR CERVICAL DECOMP/DISCECTOMY FUSION   01/2014    C4 -- C6   CATARACT EXTRACTION W/ INTRAOCULAR LENS  IMPLANT, BILATERAL   2015   COLONOSCOPY   05/14/2015   CYSTO WITH HYDRODISTENSION N/A 04/14/2018    Procedure: CYSTOSCOPY/HYDRODISTENSION WITH INSTILLATION OF PYRIDIUM AND  MARCAINE, BLADDER BIOPSY WITH FULGERATION 0.5 TO 2 CM;  Surgeon: [name], MD;  Location: [facility];  Service: Urology;  Laterality: N/A;   ELBOW DEBRIDEMENT Right 09/10/2010    and tendon debridement and radial tunnel release   EXCISION MORTON'S NEUROMA Bilateral left 02-20-2009/  right & left 05-08-2009   EXCISION VAGINAL CYST N/A 07/01/2016    Procedure: EXCISION VAGINAL CYST;  Surgeon: Tyson Dense, MD;  Location: [facility];  Service: Gynecology;  Laterality: N/A;   PLANTAR FASCIA RELEASE Bilateral left 04-02-2010/  right 04-02-2016   TONSILLECTOMY AND ADENOIDECTOMY   child   TOTAL ABDOMINAL HYSTERECTOMY   1974    w/  Left salpingoophorectomy and Partial right salpingoophorectomy      Current Medications: Active Medications      Current Meds  Medication Sig   CALCIUM PO Take by mouth daily.   meloxicam (MOBIC) 15 MG tablet Take 1 tablet (15 mg total) by mouth daily.   metoprolol  tartrate (LOPRESSOR) 25 MG tablet TAKE 1/2-1 TABLET BY MOUTH EVERY 6-8HOURS FOR BREAKTHROUGH AFIB FOR HEART RATES OVER 100   polyvinyl alcohol (LIQUIFILM TEARS) 1.4 % ophthalmic solution Place 1 drop into both eyes as needed for dry eyes.   rivaroxaban (XARELTO) 20 MG TABS tablet Take 1 tablet (20 mg total) by mouth daily with supper. Appointment needed with cardiologist   rosuvastatin (CRESTOR) 10 MG tablet Take 1 tablet (10 mg total) by mouth daily.   valACYclovir (VALTREX) 1000 MG tablet Take 1 tablet (1,000 mg total) by mouth daily.   VITAMIN D PO Take by mouth daily. Vitamin D3 and K2   zolpidem (AMBIEN) 10 MG tablet Take 1 tablet (10 mg total) by mouth at bedtime.        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  Has one son.  Works as a Counsellor for [employer].  Education: some college.     Social Determinants of Health    Financial Resource Strain: Not on file  Food Insecurity: Not on file  Transportation Needs: Not on file  Physical Activity: Not on file  Stress: Not on file  Social Connections: Not on file      Family History: The patient's family history includes Cancer in her paternal grandfather; Heart attack in her father; Lung cancer in her mother; Memory loss in her father; Multiple sclerosis in her brother.   ROS:   Please see  the history of present illness.    All other systems reviewed and are negative.   EKGs/Labs/Other Studies Reviewed:     The following studies were reviewed today:   November 2021 echo Left ventricular function  normal, 55% Right ventricular function normal Mild AI Trivial MR   EKG:  The ekg ordered today demonstrates sinus rhythm.  Normal intervals.  Ventricular rate 58 bpm.     Recent Labs: 05/28/2020: ALT 17; BUN 30; Creatinine, Ser 0.72; Hemoglobin 15.3; Platelets 247.0; Potassium 3.9; Sodium 141; TSH 1.48  Recent Lipid Panel Labs (Brief)          Component Value Date/Time    CHOL 174 05/28/2020 0858    TRIG 77.0 05/28/2020 0858    HDL 51.50 05/28/2020 0858    CHOLHDL 3 05/28/2020 0858    VLDL 15.4 05/28/2020 0858    LDLCALC 107 (H) 05/28/2020 0858        Physical Exam:     VS:  BP 128/76    Pulse (!) 58    Ht 5\' 5"  (1.651 m)    Wt 186 lb (84.4 kg)    SpO2 96%    BMI 30.95 kg/m         Wt Readings from Last 3 Encounters:  01/15/21 186 lb (84.4 kg)  01/01/21 180 lb (81.6 kg)  12/01/20 176 lb (79.8 kg)      GEN:  Well nourished, well developed in no acute distress HEENT: Normal NECK: No JVD; No carotid bruits LYMPHATICS: No lymphadenopathy CARDIAC: RRR, no murmurs, rubs, gallops RESPIRATORY:  Clear to auscultation without rales, wheezing or rhonchi  ABDOMEN: Soft, non-tender, non-distended MUSCULOSKELETAL:  No edema; No deformity  SKIN: Warm and dry NEUROLOGIC:  Alert and oriented x 3 PSYCHIATRIC:  Normal affect          Assessment     ASSESSMENT:     1. Paroxysmal atrial fibrillation (HCC)     PLAN:     In order of problems listed above:   #Paroxysmal atrial fibrillation Highly symptomatic.  Progressively worsening with time.  She is interested in a rhythm control strategy which I think is the right thing to do given her overall health and symptomatic nature of the A. fib episodes.  We discussed antiarrhythmic drug therapy and catheter ablation.   She is not interested in antiarrhythmic drugs given a friend's bad experience with them and also the potential for off target effects.  I discussed the ablation procedure in detail including the risk, recovery and efficacy and she is interested in proceeding.  I think she is overall an excellent candidate for catheter ablation.  She will continue to take Xarelto uninterrupted around the time of the procedure.  She also expressed a strong desire to avoid long-term exposure to anticoagulation.  She tells me that the cost of the NOACs are prohibitive and she would like to explore options for avoiding long-term use of the anticoagulants.  We discussed the watchman procedure in detail during today's visit and she may be interested in pursuing this as well.      Risk, benefits, and alternatives to EP study and radiofrequency ablation for afib were also discussed in detail today. These risks include but are not limited to stroke, bleeding, vascular damage, tamponade, perforation, damage to the esophagus, lungs, and other structures, pulmonary vein stenosis, worsening renal function, and death. The patient understands these risk and wishes to proceed.  We will therefore proceed with catheter ablation at the next available time.  Carto, ICE, anesthesia are requested for the procedure.  Will also obtain CT PV protocol prior to the procedure to exclude LAA thrombus and further evaluate atrial anatomy.    -----------------------  I have seen, examined the patient,  and reviewed the above assessment and plan.    Plan for PVI today.   Vickie Epley, MD 04/09/2021 7:16 AM

## 2021-04-09 NOTE — Anesthesia Postprocedure Evaluation (Signed)
Anesthesia Post Note  Patient: Lorraine [name]  Procedure(s) Performed: ATRIAL FIBRILLATION ABLATION     Patient location during evaluation: PACU Anesthesia Type: General Level of consciousness: sedated and patient cooperative Pain management: pain level controlled Vital Signs Assessment: post-procedure vital signs reviewed and stable Respiratory status: spontaneous breathing Cardiovascular status: stable Anesthetic complications: no   No notable events documented.  Last Vitals:  Vitals:   04/09/21 1230 04/09/21 1300  BP: (!) 124/53 (!) 127/49  Pulse: (!) 54 (!) 51  Resp: 15 11  Temp:    SpO2: 96% 97%    Last Pain:  Vitals:   04/09/21 1042  TempSrc:   PainSc: 0-No pain                 Nolon Nations

## 2021-04-09 NOTE — Transfer of Care (Signed)
Immediate Anesthesia Transfer of Care Note  Patient: Lorraine [name]  Procedure(s) Performed: ATRIAL FIBRILLATION ABLATION  Patient Location: Cath Lab  Anesthesia Type:General  Level of Consciousness: awake, alert  and oriented  Airway & Oxygen Therapy: Patient Spontanous Breathing and Patient connected to nasal cannula oxygen  Post-op Assessment: Report given to RN, Post -op Vital signs reviewed and stable and Patient moving all extremities X 4  Post vital signs: Reviewed and stable  Last Vitals:  Vitals Value Taken Time  BP 131/51 04/09/21 1042  Temp    Pulse 57 04/09/21 1042  Resp 9 04/09/21 1042  SpO2 97 % 04/09/21 1042  Vitals shown include unvalidated device data.  Last Pain:  Vitals:   04/09/21 1042  TempSrc:   PainSc: 0-No pain         Complications: No notable events documented.

## 2021-04-09 NOTE — Anesthesia Procedure Notes (Signed)
Procedure Name: Intubation Date/Time: 04/09/2021 7:45 AM Performed by: Kyung Rudd, CRNA Pre-anesthesia Checklist: Patient identified, Emergency Drugs available, Suction available and Patient being monitored Patient Re-evaluated:Patient Re-evaluated prior to induction Oxygen Delivery Method: Circle system utilized Preoxygenation: Pre-oxygenation with 100% oxygen Induction Type: IV induction Ventilation: Mask ventilation without difficulty Laryngoscope Size: Mac and 3 Grade View: Grade I Tube type: Oral Tube size: 7.0 mm Airway Equipment and Method: Stylet Placement Confirmation: ETT inserted through vocal cords under direct vision, positive ETCO2 and breath sounds checked- equal and bilateral Secured at: 21 cm Dental Injury: Teeth and Oropharynx as per pre-operative assessment

## 2021-04-09 NOTE — Discharge Instructions (Addendum)
Femoral Site Care This sheet gives you information about how to care for yourself after your procedure. Your health care provider may also give you more specific instructions. If you have problems or questions, contact your health care provider. What can I expect after the procedure?  After the procedure, it is common to have: Bruising that usually fades within 1-2 weeks. Tenderness at the site. Follow these instructions at home: Wound care Follow instructions from your health care provider about how to take care of your insertion site. Make sure you: Wash your hands with soap and water before you change your bandage (dressing). If soap and water are not available, use hand sanitizer. Remove your dressing as told by your health care provider. In 24 hours Do not take baths, swim, or use a hot tub until your health care provider approves. You may shower 24-48 hours after the procedure or as told by your health care provider. Gently wash the site with plain soap and water. Pat the area dry with a clean towel. Do not rub the site. This may cause bleeding. Do not apply powder or lotion to the site. Keep the site clean and dry. Check your femoral site every day for signs of infection. Check for: Redness, swelling, or pain. Fluid or blood. Warmth. Pus or a bad smell. Activity For the first 2-3 days after your procedure, or as long as directed: Avoid climbing stairs as much as possible. Do not squat. Do not lift anything that is heavier than 10 lb (4.5 kg), or the limit that you are told, until your health care provider says that it is safe. For 5 days Rest as directed. Avoid sitting for a long time without moving. Get up to take short walks every 1-2 hours. Do not drive for 24 hours if you were given a medicine to help you relax (sedative). General instructions Take over-the-counter and prescription medicines only as told by your health care provider. Keep all follow-up visits as told by  your health care provider. This is important. Contact a health care provider if you have: A fever or chills. You have redness, swelling, or pain around your insertion site. Get help right away if: The catheter insertion area swells very fast. You pass out. You suddenly start to sweat or your skin gets clammy. The catheter insertion area is bleeding, and the bleeding does not stop when you hold steady pressure on the area. The area near or just beyond the catheter insertion site becomes pale, cool, tingly, or numb. These symptoms may represent a serious problem that is an emergency. Do not wait to see if the symptoms will go away. Get medical help right away. Call your local emergency services (911 in the [place] Do not drive yourself to the hospital. Summary After the procedure, it is common to have bruising that usually fades within 1-2 weeks. Check your femoral site every day for signs of infection. Do not lift anything that is heavier than 10 lb (4.5 kg), or the limit that you are told, until your health care provider says that it is safe. This information is not intended to replace advice given to you by your health care provider. Make sure you discuss any questions you have with your health care provider. Document Revised: 03/14/2017 Document Reviewed: 03/14/2017 Elsevier Patient Education  [place] After  This sheet gives you information about how to care for yourself after your procedure. Your health care provider may also give  you more specific instructions. If you have problems or questions, contact your health care provider. What can I expect after the procedure? After the procedure, it is common to have: Bruising around your puncture site. Tenderness around your puncture site. Skipped heartbeats. Tiredness (fatigue).  Follow these instructions at home: Puncture site care  Follow instructions from your health care provider about how to take  care of your puncture site. Make sure you: If present, leave stitches (sutures), skin glue, or adhesive strips in place. These skin closures may need to stay in place for up to 2 weeks. If adhesive strip edges start to loosen and curl up, you may trim the loose edges. Do not remove adhesive strips completely unless your health care provider tells you to do that. If a large square bandage is present, this may be removed 24 hours after surgery.  Check your puncture site every day for signs of infection. Check for: Redness, swelling, or pain. Fluid or blood. If your puncture site starts to bleed, lie down on your back, apply firm pressure to the area, and contact your health care provider. Warmth. Pus or a bad smell. Driving Do not drive for at least 4 days after your procedure or however long your health care provider recommends. (Do not resume driving if you have previously been instructed not to drive for other health reasons.) Do not drive or use heavy machinery while taking prescription pain medicine. Activity Avoid activities that take a lot of effort for at least 7 days after your procedure. Do not lift anything that is heavier than 5 lb (4.5 kg) for one week.  No sexual activity for 1 week.  Return to your normal activities as told by your health care provider. Ask your health care provider what activities are safe for you. General instructions Take over-the-counter and prescription medicines only as told by your health care provider. Do not use any products that contain nicotine or tobacco, such as cigarettes and e-cigarettes. If you need help quitting, ask your health care provider. You may shower after 24 hours, but Do not take baths, swim, or use a hot tub for 1 week.  Do not drink alcohol for 24 hours after your procedure. Keep all follow-up visits as told by your health care provider. This is important. Contact a health care provider if: You have redness, mild swelling, or pain  around your puncture site. You have fluid or blood coming from your puncture site that stops after applying firm pressure to the area. Your puncture site feels warm to the touch. You have pus or a bad smell coming from your puncture site. You have a fever. You have chest pain or discomfort that spreads to your neck, jaw, or arm. You are sweating a lot. You feel nauseous. You have a fast or irregular heartbeat. You have shortness of breath. You are dizzy or light-headed and feel the need to lie down. You have pain or numbness in the arm or leg closest to your puncture site. Get help right away if: Your puncture site suddenly swells. Your puncture site is bleeding and the bleeding does not stop after applying firm pressure to the area. These symptoms may represent a serious problem that is an emergency. Do not wait to see if the symptoms will go away. Get medical help right away. Call your local emergency services (911 in the [place] Do not drive yourself to the hospital. Summary After the procedure, it is normal to have bruising and tenderness at  the puncture site in your groin, neck, or forearm. Check your puncture site every day for signs of infection. Get help right away if your puncture site is bleeding and the bleeding does not stop after applying firm pressure to the area. This is a medical emergency. This information is not intended to replace advice given to you by your health care provider. Make sure you discuss any questions you have with your health care provider.

## 2021-04-10 ENCOUNTER — Encounter (HOSPITAL_COMMUNITY): Payer: Self-pay | Admitting: Cardiology

## 2021-04-14 ENCOUNTER — Encounter: Payer: Self-pay | Admitting: Cardiology

## 2021-04-14 NOTE — Telephone Encounter (Signed)
Spoke with the patient at great length and detail about LAAO.  She wishes to proceed with LAAO 3/30. Rescheduled her appointment with Dr. Debara Pickett to March for shared decision documentation.  She requests to keep already scheduled appointment with Dr. Quentin Ore on 3/8 to discuss procedure again. She understands she will get procedure instructions at that visit. She was grateful for call and time spent.

## 2021-04-16 ENCOUNTER — Other Ambulatory Visit: Payer: Self-pay | Admitting: Registered Nurse

## 2021-04-16 ENCOUNTER — Encounter: Payer: Self-pay | Admitting: Registered Nurse

## 2021-04-16 ENCOUNTER — Ambulatory Visit (INDEPENDENT_AMBULATORY_CARE_PROVIDER_SITE_OTHER): Payer: Medicare Other | Admitting: Registered Nurse

## 2021-04-16 ENCOUNTER — Other Ambulatory Visit: Payer: Self-pay

## 2021-04-16 VITALS — BP 110/59 | HR 66 | Temp 97.9°F | Resp 17 | Ht 64.0 in | Wt 169.6 lb

## 2021-04-16 DIAGNOSIS — H66004 Acute suppurative otitis media without spontaneous rupture of ear drum, recurrent, right ear: Secondary | ICD-10-CM | POA: Diagnosis not present

## 2021-04-16 MED ORDER — LEVOFLOXACIN 500 MG PO TABS: 500.0000 mg | ORAL_TABLET | Freq: Every day | ORAL | 0 refills | Status: DC

## 2021-04-16 NOTE — Progress Notes (Signed)
Established Patient Office Visit  Subjective:  Patient ID: Lorraine [name], female    DOB: [birth year]  Age: 71 y.o. MRN: [id number]  CC:  Chief Complaint  Patient presents with   Ear Pain    Patient states she is here because she is having some right ear pain down to the jaw. Pt states its not severe but she can tell where its headed.    HPI Lorraine [name] presents for ear pain.  Onset 4-5 days ago Progressive Right ear > L.  Radiates to jaw, some adenopathy. Some R maxillary sinus pressure, no nasal congestion. Some frontal headache. No fevers, chills, fatigue, sweats, nvd, shob, doe.   Past Medical History:  Diagnosis Date   Anxiety    Aortic atherosclerosis ([place]) 02/03/2018   Noted on CT Abd/Pelvis   AR (aortic regurgitation) 07/18/2017   Mild, noted on ECHO   Bilateral cataracts    Bilateral plantar fasciitis    Chronic knee pain    Chronic low back pain    Chronic lumbar radiculopathy 07/14/2017   Mild, L5, noted on electromyography   History of palpitations    2014-- low risk exercise tolerence test, no ischemi, PVC's (09-06-2012)   HSV-1 (herpes simplex virus 1) infection    Cold sore   Hx of colonic polyps    Hyperlipidemia    Insomnia    Lactose intolerance    Left nephrolithiasis 02/03/2018   Nonobstructing, noted on CT Abd/pelvis   Morton's metatarsalgia, neuralgia, or neuroma, bilateral    Osteoarthritis    knees and lumbar, feet   Osteopenia    Peripheral neuropathy    bilateral feet -- burning and stinging   PONV (postoperative nausea and vomiting)    severe   PVC's (premature ventricular contractions)    Systolic murmur    "Slight"   Vaginal cyst    Wears glasses     Past Surgical History:  Procedure Laterality Date   ANTERIOR CERVICAL DECOMP/DISCECTOMY FUSION  01/2014   C4 -- C6   ATRIAL FIBRILLATION ABLATION N/A 04/09/2021   Procedure: ATRIAL FIBRILLATION ABLATION;  Surgeon: Vickie Epley, MD;  Location: [facility];   Service: Cardiovascular;  Laterality: N/A;   CATARACT EXTRACTION W/ INTRAOCULAR LENS  IMPLANT, BILATERAL  2015   COLONOSCOPY  05/14/2015   CYSTO WITH HYDRODISTENSION N/A 04/14/2018   Procedure: CYSTOSCOPY/HYDRODISTENSION WITH INSTILLATION OF PYRIDIUM AND MARCAINE, BLADDER BIOPSY WITH FULGERATION 0.5 TO 2 CM;  Surgeon: [name], MD;  Location: [facility];  Service: Urology;  Laterality: N/A;   ELBOW DEBRIDEMENT Right 09/10/2010   and tendon debridement and radial tunnel release   EXCISION MORTON'S NEUROMA Bilateral left 02-20-2009/  right & left 05-08-2009   EXCISION VAGINAL CYST N/A 07/01/2016   Procedure: EXCISION VAGINAL CYST;  Surgeon: Tyson Dense, MD;  Location: [facility];  Service: Gynecology;  Laterality: N/A;   PLANTAR FASCIA RELEASE Bilateral left 04-02-2010/  right 04-02-2016   TONSILLECTOMY AND ADENOIDECTOMY  child   TOTAL ABDOMINAL HYSTERECTOMY  1974   w/  Left salpingoophorectomy and Partial right salpingoophorectomy    Family History  Problem Relation Age of Onset   Lung cancer Mother        lung   Heart attack Father    Memory loss Father    Cancer Paternal Grandfather        colon   Multiple sclerosis Brother     Social History   Socioeconomic History   Marital status:  Divorced    Spouse name: Not on file   Number of children: 1   Years of education: 61   Highest education level: Not on file  Occupational History   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  Has one son.  Works as a Counsellor for [employer].  Education: some college.    Social  Determinants of Health   Financial Resource Strain: Not on file  Food Insecurity: Not on file  Transportation Needs: Not on file  Physical Activity: Not on file  Stress: Not on file  Social Connections: Not on file  Intimate Partner Violence: Not on file    Outpatient Medications Prior to Visit  Medication Sig Dispense Refill   ALPRAZolam (XANAX) 0.5 MG tablet Take 1 tablet (0.5 mg total) by mouth 2 (two) times daily as needed for anxiety. 60 tablet 1   cyanocobalamin (,VITAMIN B-12,) 1000 MCG/ML injection Inject 1 mL (1,000 mcg total) into the muscle once a week. 30 mL 3   gabapentin (NEURONTIN) 300 MG capsule Take 300 mg by mouth at bedtime.     meloxicam (MOBIC) 15 MG tablet Take 1 tablet (15 mg total) by mouth daily. (Patient taking differently: Take 7.5 mg by mouth daily.) 30 tablet 1   metoprolol tartrate (LOPRESSOR) 25 MG tablet TAKE 1/2-1 TABLET BY MOUTH EVERY 6-8HOURS FOR BREAKTHROUGH AFIB FOR HEART RATES OVER 100 45 tablet 3   polyvinyl alcohol (LIQUIFILM TEARS) 1.4 % ophthalmic solution Place 1 drop into both eyes as needed for dry eyes.     rivaroxaban (XARELTO) 20 MG TABS tablet Take 1 tablet (20 mg total) by mouth daily with supper. 90 tablet 1   rosuvastatin (CRESTOR) 10 MG tablet Take 1 tablet (10 mg total) by mouth daily. 90 tablet 1   valACYclovir (VALTREX) 1000 MG tablet Take 1 tablet (1,000 mg total) by mouth daily. (Patient taking differently: Take 1,000 mg by mouth daily as needed (Cold sore).) 30 tablet 5   zolpidem (AMBIEN) 10 MG tablet Take 1 tablet (10 mg total) by mouth at bedtime. 30 tablet 3   No facility-administered medications prior to visit.    Allergies  Allergen Reactions   Amitriptyline     Bladder retention   Codeine Nausea And Vomiting   Nortriptyline     Bladder retention     ROS Review of Systems Per hpi     Objective:    Physical Exam Vitals and nursing note reviewed.  Constitutional:      General: She is not in acute distress.     Appearance: Normal appearance. She is normal weight. She is not ill-appearing, toxic-appearing or diaphoretic.  HENT:     Right Ear: Tympanic membrane, ear canal and external ear normal. There is no impacted cerumen.     Left Ear: Tympanic membrane, ear canal and external ear normal. There is no impacted cerumen.     Nose: Nose normal. No congestion.  Cardiovascular:     Rate and Rhythm: Normal rate and regular rhythm.     Heart sounds: Normal heart  sounds. No murmur heard.   No friction rub. No gallop.  Pulmonary:     Effort: Pulmonary effort is normal. No respiratory distress.     Breath sounds: Normal breath sounds. No stridor. No wheezing, rhonchi or rales.  Chest:     Chest wall: No tenderness.  Skin:    General: Skin is warm and dry.  Neurological:     General: No focal deficit present.     Mental Status: She is alert and oriented to person, place, and time. Mental status is at baseline.  Psychiatric:        Mood and Affect: Mood normal.        Behavior: Behavior normal.        Thought Content: Thought content normal.        Judgment: Judgment normal.    BP (!) 110/59    Pulse 66    Temp 97.9 F (36.6 C) (Temporal)    Resp 17    Ht '5\' 4"'  (1.626 m)    Wt 169 lb 9.6 oz (76.9 kg)    SpO2 100%    BMI 29.11 kg/m  Wt Readings from Last 3 Encounters:  04/16/21 169 lb 9.6 oz (76.9 kg)  04/09/21 172 lb (78 kg)  03/18/21 177 lb 1.6 oz (80.3 kg)     Health Maintenance Due  Topic Date Due   Zoster Vaccines- Shingrix (1 of 2) Never done   COVID-19 Vaccine (3 - Booster for Janssen series) 04/09/2020    There are no preventive care reminders to display for this patient.  Lab Results  Component Value Date   TSH 1.48 05/28/2020   Lab Results  Component Value Date   WBC 6.8 03/19/2021   HGB 14.4 03/19/2021   HCT 43.8 03/19/2021   MCV 85 03/19/2021   PLT 254 03/19/2021   Lab Results  Component Value Date   NA 139 03/19/2021   K 4.1 03/19/2021   CO2 21 03/19/2021    GLUCOSE 96 03/19/2021   BUN 28 (H) 03/19/2021   CREATININE 0.65 03/19/2021   BILITOT 0.9 05/28/2020   ALKPHOS 76 05/28/2020   AST 20 05/28/2020   ALT 17 05/28/2020   PROT 7.6 05/28/2020   ALBUMIN 4.5 05/28/2020   CALCIUM 9.5 03/19/2021   ANIONGAP 8 04/14/2018   EGFR 95 03/19/2021   GFR 85.01 05/28/2020   Lab Results  Component Value Date   CHOL 174 05/28/2020   Lab Results  Component Value Date   HDL 51.50 05/28/2020   Lab Results  Component Value Date   LDLCALC 107 (H) 05/28/2020   Lab Results  Component Value Date   TRIG 77.0 05/28/2020   Lab Results  Component Value Date   CHOLHDL 3 05/28/2020   No results found for: HGBA1C    Assessment & Plan:   Problem List Items Addressed This Visit   None Visit Diagnoses     Recurrent acute suppurative otitis media of right ear without spontaneous rupture of tympanic membrane    -  Primary   Relevant Medications   levofloxacin (LEVAQUIN) 500 MG tablet       Meds ordered this encounter  Medications   levofloxacin (LEVAQUIN) 500 MG tablet    Sig: Take 1 tablet (500 mg total) by mouth daily for 7 days.    Dispense:  7 tablet    Refill:  0    Order Specific Question:   Supervising Provider    Answer:   Carlota Raspberry, JEFFREY R [[id number]]  Follow-up: Return if symptoms worsen or fail to improve.   PLAN Exam reassuring but given pt hx of recurrent infections, will tx Given recent ablation will avoid macrolides. Given hx of GI upset and diarrhea with augmentin and doxycycline, will tx with levaquin as above Encourage her to use flonase, nasal saline rinse Follow up if worsening or failing to improve Patient encouraged to call clinic with any questions, comments, or concerns.  Maximiano Coss, NP

## 2021-04-16 NOTE — Patient Instructions (Addendum)
Ms. Marda Breidenbach to see you, sorry you're not feeling well!  Levaquin once daily. Take it easy on physical activity and eat a healthy diet.  Call if dramatically worsening in the next day.  Call if not better by Monday  Finish all antibiotics even if you are feeling better.  Thank you  Rich     If you have lab work done today you will be contacted with your lab results within the next 2 weeks.  If you have not heard from [place] then please contact us. The fastest way to get your results is to register for My Chart.   IF you received an x-ray today, you will receive an invoice from [facility]. Please contact [facility] at [phone] with questions or concerns regarding your invoice.   IF you received labwork today, you will receive an invoice from [facility]. Please contact [facility] at [phone] with questions or concerns regarding your invoice.   Our billing staff will not be able to assist you with questions regarding bills from these companies.  You will be contacted with the lab results as soon as they are available. The fastest way to get your results is to activate your My Chart account. Instructions are located on the last page of this paperwork. If you have not heard from [place] regarding the results in 2 weeks, please contact this office.

## 2021-04-16 NOTE — Telephone Encounter (Signed)
Replaced  Thanks  Denice Paradise

## 2021-04-16 NOTE — Telephone Encounter (Signed)
Pharmacy states there has been a recall on the levofloxacin, she wanted something to know if something else can be called in.  Please advise

## 2021-04-21 ENCOUNTER — Other Ambulatory Visit: Payer: Self-pay

## 2021-04-21 ENCOUNTER — Ambulatory Visit: Payer: Medicare Other | Attending: Hematology and Oncology

## 2021-04-21 DIAGNOSIS — R2689 Other abnormalities of gait and mobility: Secondary | ICD-10-CM | POA: Diagnosis not present

## 2021-04-21 DIAGNOSIS — M79672 Pain in left foot: Secondary | ICD-10-CM | POA: Insufficient documentation

## 2021-04-21 DIAGNOSIS — M79671 Pain in right foot: Secondary | ICD-10-CM | POA: Insufficient documentation

## 2021-04-21 NOTE — Therapy (Signed)
[facility] [address] [place], [place], [address] Phone: [phone]   Fax:  [phone]  Physical Therapy Treatment  Patient Details  Name: Lorraine [name] MRN: [id number] Date of Birth: 09/26/1950 Referring Provider (PT): Nicholas Lose, MD   Encounter Date: 04/21/2021   PT End of Session - 04/21/21 0838     Visit Number 5    PT Start Time 0802    PT Stop Time 0831    PT Time Calculation (min) 29 min    Activity Tolerance Patient tolerated treatment well    Behavior During Therapy [facility] for tasks assessed/performed             Past Medical History:  Diagnosis Date   Anxiety    Aortic atherosclerosis ([place]) 02/03/2018   Noted on CT Abd/Pelvis   AR (aortic regurgitation) 07/18/2017   Mild, noted on ECHO   Bilateral cataracts    Bilateral plantar fasciitis    Chronic knee pain    Chronic low back pain    Chronic lumbar radiculopathy 07/14/2017   Mild, L5, noted on electromyography   History of palpitations    2014-- low risk exercise tolerence test, no ischemi, PVC's (09-06-2012)   HSV-1 (herpes simplex virus 1) infection    Cold sore   Hx of colonic polyps    Hyperlipidemia    Insomnia    Lactose intolerance    Left nephrolithiasis 02/03/2018   Nonobstructing, noted on CT Abd/pelvis   Morton's metatarsalgia, neuralgia, or neuroma, bilateral    Osteoarthritis    knees and lumbar, feet   Osteopenia    Peripheral neuropathy    bilateral feet -- burning and stinging   PONV (postoperative nausea and vomiting)    severe   PVC's (premature ventricular contractions)    Systolic murmur    "Slight"   Vaginal cyst    Wears glasses     Past Surgical History:  Procedure Laterality Date   ANTERIOR CERVICAL DECOMP/DISCECTOMY FUSION  01/2014   C4 -- C6   ATRIAL FIBRILLATION ABLATION N/A 04/09/2021   Procedure: ATRIAL FIBRILLATION ABLATION;  Surgeon: Vickie Epley, MD;  Location: [facility];   Service: Cardiovascular;  Laterality: N/A;   CATARACT EXTRACTION W/ INTRAOCULAR LENS  IMPLANT, BILATERAL  2015   COLONOSCOPY  05/14/2015   CYSTO WITH HYDRODISTENSION N/A 04/14/2018   Procedure: CYSTOSCOPY/HYDRODISTENSION WITH INSTILLATION OF PYRIDIUM AND MARCAINE, BLADDER BIOPSY WITH FULGERATION 0.5 TO 2 CM;  Surgeon: [name], MD;  Location: [facility];  Service: Urology;  Laterality: N/A;   ELBOW DEBRIDEMENT Right 09/10/2010   and tendon debridement and radial tunnel release   EXCISION MORTON'S NEUROMA Bilateral left 02-20-2009/  right & left 05-08-2009   EXCISION VAGINAL CYST N/A 07/01/2016   Procedure: EXCISION VAGINAL CYST;  Surgeon: Tyson Dense, MD;  Location: [facility];  Service: Gynecology;  Laterality: N/A;   PLANTAR FASCIA RELEASE Bilateral left 04-02-2010/  right 04-02-2016   TONSILLECTOMY AND ADENOIDECTOMY  child   TOTAL ABDOMINAL HYSTERECTOMY  1974   w/  Left salpingoophorectomy and Partial right salpingoophorectomy    There were no vitals filed for this visit.   Subjective Assessment - 04/21/21 0805     Subjective I don't think this is helping me.  I would like today to be my last day.  I felt better after my cardiac procedure because I wasn't on my feet.    Pertinent History stable mammogram Dec 2022, minor  arthritist in feet, had lumbar injection last Fall and no help, has had 4 foot surgeries (Morton neuromas bil, plantar fascia surgeries on both feet), NCV test 2019 + lumbar radic L5 mild    Currently in Pain? Yes    Pain Score 3    Lt>Rt up to 4-5/10   Pain Location Foot    Pain Descriptors / Indicators Burning    Pain Onset More than a month ago    Pain Frequency Constant    Aggravating Factors  being on feet, at night    Pain Relieving Factors Gabapentin, early in the day                [facility] PT Assessment - 04/21/21 0001       Assessment   Medical Diagnosis C50.312,Z17.0 (ICD-10-CM) - Malignant neoplasm  of lower-inner quadrant of left breast in female, estrogen receptor positive ([place]), peripheral neuropathy    Referring Provider (PT) Nicholas Lose, MD      Cognition   Overall Cognitive Status Within Functional Limits for tasks assessed      Sensation   Light Touch Impaired by gross assessment    Additional Comments bil feet stocking distribution, below ankle      Palpation   Spinal mobility hypomobile bil midfoot mobility, hypomobile subtalar joints bil    Palpation comment bil post tibialis, medial gastroc, lateral gastroc, peroneals                           [facility] Adult PT Treatment/Exercise - 04/21/21 0001       Manual Therapy   Manual Therapy Soft tissue mobilization    Manual therapy comments skilled palpation and monotoring during DN    Soft tissue mobilization elongation to bil calf and feet, metatarsal and calcaneous mobs              Trigger Point Dry Needling - 04/21/21 0001     Consent Given? Yes    Education Handout Provided Previously provided    Muscles Treated Lower Quadrant Gastrocnemius;Soleus;Quadratus plantae    Quadatus plantae response Twitch response elicited;Palpable increased muscle length    Gastrocnemius Response Twitch response elicited;Palpable increased muscle length    Soleus Response Twitch response elicited;Palpable increased muscle length                     PT Short Term Goals - 03/19/21 1324       PT SHORT TERM GOAL #1   Title STG = LTG               PT Long Term Goals - 04/21/21 0810       PT LONG TERM GOAL #1   Title Pt will report at least 50% reduction in frequency and intensity of bil foot pain and burning.    Baseline No change    Status Not Met      PT LONG TERM GOAL #2   Title Pt will be able to stand and walk for at least 15 min before increase in bil foot symptoms    Baseline pain with first steps.  Not limited in walking as she endures the pain      PT LONG TERM GOAL #3   Title  Pt will display improved midfoot and subtalar mobility in bil feet allowing pronation for improved shock absorption with gait.    Status Achieved      PT LONG TERM GOAL #4  Title Pt will be ind with HEP for foot, ankle and leg stretches and mobility self-care to maintain gains made in PT.    Status Achieved                   Plan - 04/21/21 0811     Clinical Impression Statement Pt will D/C today.  No change in symptoms with DN and the treatment itself.  Pt is going to continue with HEP for flexibility after D/C and follow-up with MD.    PT Next Visit Plan D/C PT    Consulted and Agree with Plan of Care Patient             Patient will benefit from skilled therapeutic intervention in order to improve the following deficits and impairments:  Decreased activity tolerance  Visit Diagnosis: Pain in right foot  Pain in left foot  Other abnormalities of gait and mobility     Problem List Patient Active Problem List   Diagnosis Date Noted   Prolapse of female genital organs 07/09/2020   Chronic pain of both shoulders 11/28/2019   Eustachian tube dysfunction, bilateral 11/05/2019   Acquired thrombophilia ([place]) 03/08/2019   Malignant neoplasm of lower-inner quadrant of left breast in female, estrogen receptor positive ([place]) 07/26/2018   Anxiety about health 05/31/2018   Paroxysmal atrial fibrillation ([place]) 05/18/2018   Overweight (BMI 25.0-29.9) 34/28/7681   Systolic murmur 15/72/6203   Shingles 11/23/2016   Arthritis 05/26/2016   Insomnia 05/26/2016   Neuropathy 05/26/2016   Positive ANA (antinuclear antibody) 05/26/2016   Hyperlipidemia 05/26/2016   Neck and shoulder pain 01/26/2014   Cervicalgia 01/26/2014   Palpitations 08/31/2012   METATARSALGIA 05/06/2010   PLANTAR FASCIITIS, LEFT 05/06/2010   FOOT PAIN, BILATERAL 05/06/2010  PHYSICAL THERAPY DISCHARGE SUMMARY  Visits from Start of Care: 5  Current functional level related to goals / functional  outcomes: See above.  No change in function.     Remaining deficits: Bil LE neuropathy   Education / Equipment: HEP   Patient agrees to discharge. Patient goals were partially met. Patient is being discharged due to the patient's request.   Sigurd Sos, PT 04/21/21 8:42 AM   [facility] @ [address] [place], [place], [address] Phone: [phone]   Fax:  [phone]  Name: Lorraine [name] MRN: [id number] Date of Birth: 1950-11-29

## 2021-05-01 DIAGNOSIS — Z20822 Contact with and (suspected) exposure to covid-19: Secondary | ICD-10-CM | POA: Diagnosis not present

## 2021-05-05 ENCOUNTER — Ambulatory Visit (HOSPITAL_COMMUNITY)
Admission: RE | Admit: 2021-05-05 | Discharge: 2021-05-05 | Disposition: A | Discharge status: Home / Self Care | Payer: Medicare Other | Source: Ambulatory Visit | Attending: Physician Assistant | Admitting: Physician Assistant

## 2021-05-05 ENCOUNTER — Encounter (HOSPITAL_COMMUNITY): Payer: Self-pay | Admitting: Physician Assistant

## 2021-05-05 ENCOUNTER — Other Ambulatory Visit: Payer: Self-pay

## 2021-05-05 VITALS — BP 138/80 | HR 48 | Ht 64.0 in | Wt 168.4 lb

## 2021-05-05 DIAGNOSIS — Z7901 Long term (current) use of anticoagulants: Secondary | ICD-10-CM | POA: Insufficient documentation

## 2021-05-05 DIAGNOSIS — I480 Paroxysmal atrial fibrillation: Secondary | ICD-10-CM | POA: Insufficient documentation

## 2021-05-05 DIAGNOSIS — D6869 Other thrombophilia: Secondary | ICD-10-CM | POA: Diagnosis not present

## 2021-05-05 NOTE — Progress Notes (Signed)
Primary Care Physician: [name], MD Primary Cardiologist: Dr Debara Pickett Primary EP: Dr Quentin Ore Referring Physician: [facility]   Lorraine [name] is a 71 y.o. female with a history of paroxysmal atrial fibrillation who presents for follow up in the [facility].  The patient was initially diagnosed with atrial fibrillation 04/14/18 after having a cystoscopy she developed atrial fibrillation with RVR HR 130s in PACU. She was asymptomatic during this episode. She was given 15 mg diltiazem and converted to NSR. She was then started on Eliquis and PRN metoprolol. Cardiac monitoring showed 4% AF burden with longest episode lasting 2.5 hours. She was unable to tolerate Eliquis 2/2 side effects and is now on Xarelto.  On follow up today, patient is s/p afib ablation 04/09/21 with Dr Quentin Ore. She reports that she has done very well with no interim episodes of afib since the ablation. She denies CP, swallowing pain, or groin issues. She is scheduled for Watchman implant on 06/11/21.   Today, she denies symptoms of palpitations, chest pain, shortness of breath, orthopnea, PND, lower extremity edema, dizziness, presyncope, syncope, snoring, daytime somnolence, bleeding, or neurologic sequela. The patient is tolerating medications without difficulties and is otherwise without complaint today.    Atrial Fibrillation Risk Factors:  she does not have symptoms or diagnosis of sleep apnea. she does not have a history of rheumatic fever. she does not have a history of alcohol use. she has a BMI of Body mass index is 28.91 kg/m.Marland Kitchen Filed Weights   05/05/21 0819  Weight: 76.4 kg      Atrial Fibrillation Management history:  Previous antiarrhythmic drugs: none Previous cardioversions: none Previous ablations: 04/09/21 CHADS2VASC score: 3 (female, age, aortic plaque) Anticoagulation history: Eliquis, Xarelto   Past Medical History:  Diagnosis Date   Anxiety     Aortic atherosclerosis ([place]) 02/03/2018   Noted on CT Abd/Pelvis   AR (aortic regurgitation) 07/18/2017   Mild, noted on ECHO   Bilateral cataracts    Bilateral plantar fasciitis    Chronic knee pain    Chronic low back pain    Chronic lumbar radiculopathy 07/14/2017   Mild, L5, noted on electromyography   History of palpitations    2014-- low risk exercise tolerence test, no ischemi, PVC's (09-06-2012)   HSV-1 (herpes simplex virus 1) infection    Cold sore   Hx of colonic polyps    Hyperlipidemia    Insomnia    Lactose intolerance    Left nephrolithiasis 02/03/2018   Nonobstructing, noted on CT Abd/pelvis   Morton's metatarsalgia, neuralgia, or neuroma, bilateral    Osteoarthritis    knees and lumbar, feet   Osteopenia    Peripheral neuropathy    bilateral feet -- burning and stinging   PONV (postoperative nausea and vomiting)    severe   PVC's (premature ventricular contractions)    Systolic murmur    "Slight"   Vaginal cyst    Wears glasses    Past Surgical History:  Procedure Laterality Date   ANTERIOR CERVICAL DECOMP/DISCECTOMY FUSION  01/2014   C4 -- C6   ATRIAL FIBRILLATION ABLATION N/A 04/09/2021   Procedure: ATRIAL FIBRILLATION ABLATION;  Surgeon: Vickie Epley, MD;  Location: [facility];  Service: Cardiovascular;  Laterality: N/A;   CATARACT EXTRACTION W/ INTRAOCULAR LENS  IMPLANT, BILATERAL  2015   COLONOSCOPY  05/14/2015   CYSTO WITH HYDRODISTENSION N/A 04/14/2018   Procedure: CYSTOSCOPY/HYDRODISTENSION WITH INSTILLATION OF PYRIDIUM AND MARCAINE, BLADDER BIOPSY  WITH FULGERATION 0.5 TO 2 CM;  Surgeon: [name], MD;  Location: [facility];  Service: Urology;  Laterality: N/A;   ELBOW DEBRIDEMENT Right 09/10/2010   and tendon debridement and radial tunnel release   EXCISION MORTON'S NEUROMA Bilateral left 02-20-2009/  right & left 05-08-2009   EXCISION VAGINAL CYST N/A 07/01/2016   Procedure: EXCISION VAGINAL CYST;  Surgeon:  Tyson Dense, MD;  Location: [facility];  Service: Gynecology;  Laterality: N/A;   PLANTAR FASCIA RELEASE Bilateral left 04-02-2010/  right 04-02-2016   TONSILLECTOMY AND ADENOIDECTOMY  child   TOTAL ABDOMINAL HYSTERECTOMY  1974   w/  Left salpingoophorectomy and Partial right salpingoophorectomy    Current Outpatient Medications  Medication Sig Dispense Refill   ALPRAZolam (XANAX) 0.5 MG tablet Take 1 tablet (0.5 mg total) by mouth 2 (two) times daily as needed for anxiety. 60 tablet 1   cyanocobalamin (,VITAMIN B-12,) 1000 MCG/ML injection Inject 1 mL (1,000 mcg total) into the muscle once a week. 30 mL 3   gabapentin (NEURONTIN) 300 MG capsule Take 300 mg by mouth at bedtime.     meloxicam (MOBIC) 15 MG tablet Take 1 tablet (15 mg total) by mouth daily. (Patient taking differently: Take 7.5 mg by mouth daily.) 30 tablet 1   metoprolol tartrate (LOPRESSOR) 25 MG tablet TAKE 1/2-1 TABLET BY MOUTH EVERY 6-8HOURS FOR BREAKTHROUGH AFIB FOR HEART RATES OVER 100 45 tablet 3   polyvinyl alcohol (LIQUIFILM TEARS) 1.4 % ophthalmic solution Place 1 drop into both eyes as needed for dry eyes.     rivaroxaban (XARELTO) 20 MG TABS tablet Take 1 tablet (20 mg total) by mouth daily with supper. 90 tablet 1   rosuvastatin (CRESTOR) 10 MG tablet Take 1 tablet (10 mg total) by mouth daily. 90 tablet 1   valACYclovir (VALTREX) 1000 MG tablet Take 1 tablet (1,000 mg total) by mouth daily. (Patient taking differently: Take 1,000 mg by mouth daily as needed (Cold sore).) 30 tablet 5   zolpidem (AMBIEN) 10 MG tablet Take 1 tablet (10 mg total) by mouth at bedtime. 30 tablet 3   No current facility-administered medications for this encounter.    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  Has one son.  Works as a Counsellor for [employer].  Education: some college.    Social Determinants of Health   Financial Resource Strain: Not on file  Food Insecurity: Not on file  Transportation Needs: Not on file  Physical Activity: Not on file  Stress: Not on file  Social Connections: Not on file  Intimate Partner Violence:  Not on file    Family History  Problem Relation Age of Onset   Lung cancer Mother        lung   Heart attack Father    Memory loss Father    Cancer Paternal Grandfather        colon   Multiple sclerosis Brother    The patient does not have a history of early familial atrial fibrillation or other arrhythmias.  ROS- All systems are reviewed and negative except as per the HPI above.  Physical Exam: Vitals:   05/05/21 0819  BP: 138/80  Pulse: (!) 48  Weight: 76.4 kg  Height: 5\' 4"  (1.626 m)    GEN- The patient is a well appearing female, alert and oriented x 3 today.   HEENT-head normocephalic, atraumatic, sclera clear, conjunctiva pink, hearing intact, trachea midline. Lungs- Clear to ausculation bilaterally, normal work of breathing Heart- Regular rate and rhythm, bradycardia, no murmurs, rubs or gallops  GI- soft, NT, ND, + BS Extremities- no clubbing, cyanosis, or edema MS- no significant deformity or atrophy Skin- no rash or lesion Psych- euthymic mood, full affect Neuro- strength and sensation are intact   Wt Readings from  Last 3 Encounters:  05/05/21 76.4 kg  04/16/21 76.9 kg  04/09/21 78 kg    EKG today demonstrates  SB Vent. rate 48 BPM PR interval 154 ms QRS duration 92 ms QT/QTcB 432/385 ms  Echo 02/04/21 demonstrated   1. Left ventricular ejection fraction, by estimation, is 65 to 70%. The  left ventricle has normal function. The left ventricle has no regional  wall motion abnormalities. Left ventricular diastolic parameters were  normal.   2. Right ventricular systolic function is normal. The right ventricular  size is normal.   3. The mitral valve is normal in structure. Trivial mitral valve  regurgitation.   4. The aortic valve is normal in structure. Aortic valve regurgitation is  mild to moderate. No aortic stenosis is present.   5. Aortic dilatation noted. There is mild dilatation of the ascending  aorta, measuring 41 mm.    Epic records are reviewed at length today  CHA2DS2-VASc Score = 3  The patient's score is based upon: CHF History: 0 HTN History: 0 Diabetes History: 0 Stroke History: 0 Vascular Disease History: 1 (aortic atherosclerosis) Age Score: 1 Gender Score: 1       ASSESSMENT AND PLAN: 1. Paroxysmal Atrial Fibrillation (ICD10:  I48.0) The patient's CHA2DS2-VASc score is 3, indicating a 3.2% annual risk of stroke.   S/p afib ablation 04/09/21 Patient appears to be maintaining SR. Continue Lopressor 12.5 mg PRN q 6 hours for heart racing. Continue Xarelto 20 mg daily for now.  2. Secondary Hypercoagulable State (ICD10:  D68.69) The patient is at significant risk for stroke/thromboembolism based upon her CHA2DS2-VASc Score of 3.  Continue Rivaroxaban (Xarelto).  Plans for Watchman implant 06/11/21.   Follow up with Dr Debara Pickett and Dr Quentin Ore as scheduled.    [facility] [address] [place], [place] [address] [phone]

## 2021-05-11 ENCOUNTER — Ambulatory Visit: Payer: Medicare Other | Admitting: Cardiology

## 2021-05-15 ENCOUNTER — Encounter: Payer: Self-pay | Admitting: Internal Medicine

## 2021-05-15 ENCOUNTER — Telehealth (INDEPENDENT_AMBULATORY_CARE_PROVIDER_SITE_OTHER): Payer: Medicare Other | Admitting: Internal Medicine

## 2021-05-15 ENCOUNTER — Other Ambulatory Visit: Payer: Self-pay

## 2021-05-15 VITALS — BP 117/69 | HR 52 | Temp 97.6°F | Ht 65.0 in | Wt 164.0 lb

## 2021-05-15 DIAGNOSIS — D6869 Other thrombophilia: Secondary | ICD-10-CM

## 2021-05-15 DIAGNOSIS — I480 Paroxysmal atrial fibrillation: Secondary | ICD-10-CM

## 2021-05-15 NOTE — Progress Notes (Signed)
Virtual Visit via Video Note   This visit type was conducted due to national recommendations for restrictions regarding the COVID-19 Pandemic (e.g. social distancing) in an effort to limit this patient's exposure and mitigate transmission in our community.  Due to her co-morbid illnesses, this patient is at least at moderate risk for complications without adequate follow up.  This format is felt to be most appropriate for this patient at this time.  All issues noted in this document were discussed and addressed.  A limited physical exam was performed with this format.  Please refer to the patient's chart for her consent to telehealth for [facility].    Date:  05/15/2021   ID:  Lorraine [name], DOB 31-May-1950, MRN [id number] The patient was identified using 2 identifiers.  Evaluation Performed:  Follow-Up Visit  Patient Location:  [place] [address]  Provider location:   [address], [address] [place], [place] [address]  PCP:  [name], MD  Cardiologist:  [name], MD Electrophysiologist:  Vickie Epley, MD   Chief Complaint:  Follow-up afib  History of Present Illness:    Lorraine [name] is a 71 y.o. female who presents via audio/video conferencing for a telehealth visit today.  Lorraine [name] returns today for follow-up.  She was referred to electrophysiology for evaluation and management of atrial fibrillation.  After discussion with Dr. Curt Bears, she underwent successful catheter ablation in January.  Subsequently though she has had concerns about long-term anticoagulation.  She has concerns about bleeding risk and felt that she would like to come off of anticoagulation however knows that this is not a guarantee of being free of stroke despite the fact that she is now had an ablation.  Based on that, she is interested in the [place] procedure.  She is here to discuss that further today.   The patient does not have symptoms concerning  for COVID-19 infection (fever, chills, cough, or new SHORTNESS OF BREATH).    Prior CV studies:   The following studies were reviewed today:  Chart reviewed  PMHx:  Past Medical History:  Diagnosis Date   Anxiety    Aortic atherosclerosis ([place]) 02/03/2018   Noted on CT Abd/Pelvis   AR (aortic regurgitation) 07/18/2017   Mild, noted on ECHO   Bilateral cataracts    Bilateral plantar fasciitis    Chronic knee pain    Chronic low back pain    Chronic lumbar radiculopathy 07/14/2017   Mild, L5, noted on electromyography   History of palpitations    2014-- low risk exercise tolerence test, no ischemi, PVC's (09-06-2012)   HSV-1 (herpes simplex virus 1) infection    Cold sore   Hx of colonic polyps    Hyperlipidemia    Insomnia    Lactose intolerance    Left nephrolithiasis 02/03/2018   Nonobstructing, noted on CT Abd/pelvis   Morton's metatarsalgia, neuralgia, or neuroma, bilateral    Osteoarthritis    knees and lumbar, feet   Osteopenia    Peripheral neuropathy    bilateral feet -- burning and stinging   PONV (postoperative nausea and vomiting)    severe   PVC's (premature ventricular contractions)    Systolic murmur    "Slight"   Vaginal cyst    Wears glasses     Past Surgical History:  Procedure Laterality Date   ANTERIOR CERVICAL DECOMP/DISCECTOMY FUSION  01/2014   C4 -- C6   ATRIAL FIBRILLATION ABLATION N/A 04/09/2021  Procedure: ATRIAL FIBRILLATION ABLATION;  Surgeon: Vickie Epley, MD;  Location: [facility];  Service: Cardiovascular;  Laterality: N/A;   CATARACT EXTRACTION W/ INTRAOCULAR LENS  IMPLANT, BILATERAL  2015   COLONOSCOPY  05/14/2015   CYSTO WITH HYDRODISTENSION N/A 04/14/2018   Procedure: CYSTOSCOPY/HYDRODISTENSION WITH INSTILLATION OF PYRIDIUM AND MARCAINE, BLADDER BIOPSY WITH FULGERATION 0.5 TO 2 CM;  Surgeon: [name], MD;  Location[facility];  Service: Urology;  Laterality: N/A;   ELBOW DEBRIDEMENT Right  09/10/2010   and tendon debridement and radial tunnel release   EXCISION MORTON'S NEUROMA Bilateral left 02-20-2009/  right & left 05-08-2009   EXCISION VAGINAL CYST N/A 07/01/2016   Procedure: EXCISION VAGINAL CYST;  Surgeon: Tyson Dense, MD;  Location: [facility];  Service: Gynecology;  Laterality: N/A;   PLANTAR FASCIA RELEASE Bilateral left 04-02-2010/  right 04-02-2016   TONSILLECTOMY AND ADENOIDECTOMY  child   TOTAL ABDOMINAL HYSTERECTOMY  1974   w/  Left salpingoophorectomy and Partial right salpingoophorectomy    FAMHx:  Family History  Problem Relation Age of Onset   Lung cancer Mother        lung   Heart attack Father    Memory loss Father    Cancer Paternal Grandfather        colon   Multiple sclerosis Brother     SOCHx:   reports that she has never smoked. She has never used smokeless tobacco. She reports current alcohol use. She reports that she does not use drugs.  ALLERGIES:  Allergies  Allergen Reactions   Amitriptyline     Bladder retention   Codeine Nausea And Vomiting   Nortriptyline     Bladder retention     MEDS:  Current Meds  Medication Sig   ALPRAZolam (XANAX) 0.5 MG tablet Take 1 tablet (0.5 mg total) by mouth 2 (two) times daily as needed for anxiety.   cyanocobalamin (,VITAMIN B-12,) 1000 MCG/ML injection Inject 1 mL (1,000 mcg total) into the muscle once a week.   gabapentin (NEURONTIN) 300 MG capsule Take 300 mg by mouth at bedtime.   meloxicam (MOBIC) 15 MG tablet Take 1 tablet (15 mg total) by mouth daily. (Patient taking differently: Take 7.5 mg by mouth daily.)   metoprolol tartrate (LOPRESSOR) 25 MG tablet TAKE 1/2-1 TABLET BY MOUTH EVERY 6-8HOURS FOR BREAKTHROUGH AFIB FOR HEART RATES OVER 100   polyvinyl alcohol (LIQUIFILM TEARS) 1.4 % ophthalmic solution Place 1 drop into both eyes as needed for dry eyes.   rivaroxaban (XARELTO) 20 MG TABS tablet Take 1 tablet (20 mg total) by mouth daily with supper.    rosuvastatin (CRESTOR) 10 MG tablet Take 1 tablet (10 mg total) by mouth daily.   valACYclovir (VALTREX) 1000 MG tablet Take 1 tablet (1,000 mg total) by mouth daily. (Patient taking differently: Take 1,000 mg by mouth daily as needed (Cold sore).)   zolpidem (AMBIEN) 10 MG tablet Take 1 tablet (10 mg total) by mouth at bedtime.     ROS: Pertinent items noted in HPI and remainder of comprehensive ROS otherwise negative.  Labs/Other Tests and Data Reviewed:    Recent Labs: 05/28/2020: ALT 17; TSH 1.48 03/19/2021: BUN 28; Creatinine, Ser 0.65; Hemoglobin 14.4; Platelets 254; Potassium 4.1; Sodium 139   Recent Lipid Panel Lab Results  Component Value Date/Time   CHOL 174 05/28/2020 08:58 AM   TRIG 77.0 05/28/2020 08:58 AM   HDL 51.50 05/28/2020 08:58 AM   CHOLHDL 3 05/28/2020 08:58 AM  [place] 107 (H) 05/28/2020 08:58 AM    Wt Readings from Last 3 Encounters:  05/15/21 164 lb (74.4 kg)  05/05/21 168 lb 6.4 oz (76.4 kg)  04/16/21 169 lb 9.6 oz (76.9 kg)     Exam:    Vital Signs:  BP 117/69    Pulse (!) 52    Temp 97.6 F (36.4 C)    Ht 5\' 5"  (1.651 m)    Wt 164 lb (74.4 kg)    BMI 27.29 kg/m    General appearance: alert and no distress Lungs: no respiratory difficulty Abdomen: normal weight Extremities: extremities normal, atraumatic, no cyanosis or edema Skin: Skin color, texture, turgor normal. No rashes or lesions Neurologic: Grossly normal Psych: Pleasant  ASSESSMENT & PLAN:    PAF - s/p catheter ablation (03/2021) CHA2DS2-VASc score 3 Desires not to take long-term anticoagulation  Lorraine [name] Referral for Left Atrial Appendage Closure with Non-Valvular Atrial Fibrillation   BEATA BEASON is a 71 y.o. female is being referred to the [facility] Team for evaluation for Left Atrial Appendage Closure with Watchman device for the management of stroke risk resulting form non-valvular atrial fibrillation.    Base upon Ms. Chisholm's  history, she is felt to be a poor candidate for long-term anticoagulation because of intolerance to oral anticoagulation, concern for long-term bleeding risk and medication cost.  The patient has a HAS-BLED score of   indicating a Yearly Major Bleeding Risk of 1.02 %.      Her CHADS2-VASc Score is 3 with an unadjusted Ischemic Stroke Rate (% per year) of 3.2%.    Her stroke risk necessitates a strategy of stroke prevention with either long-term oral anticoagulation or left atrial appendage occlusion therapy. We have discussed their bleeding risk in the context of their comorbid medical problems, as well as the rationale for referral for evaluation of Watchman left atrial appendage occlusion therapy. While the patient is at high long-term bleeding risk, they may be appropriate for short-term anticoagulation. Based on this individual patient's stroke and bleeding risk, a shared decision has been made to refer the patient for consideration of Watchman left atrial appendage closure utilizing the [employer] of Cardiology shared decision tool.   COVID-19 Education: The signs and symptoms of COVID-19 were discussed with the patient and how to seek care for testing (follow up with PCP or arrange E-visit).  The importance of social distancing was discussed today.  Patient Risk:   After full review of this patients clinical status, I feel that they are at least moderate risk at this time.  Time:   Today, I have spent 15 minutes with the patient with telehealth technology discussing stroke risk, the Watchman procedure, etc.     Medication Adjustments/Labs and Tests Ordered: Current medicines are reviewed at length with the patient today.  Concerns regarding medicines are outlined above.   Tests Ordered: No orders of the defined types were placed in this encounter.   Medication Changes: No orders of the defined types were placed in this encounter.   Disposition:  in 6  month(s)  [name], MD, [facility], [place] Director of the Advanced Lipid Disorders &  Cardiovascular Risk Reduction Clinic Diplomate of the American Board of Clinical Lipidology Attending Cardiologist  Direct Dial: [phone]   Fax: [phone]  Website:  [email or web address]  [name], MD  05/15/2021 4:40 PM

## 2021-05-15 NOTE — Patient Instructions (Signed)
Medication Instructions:  ?NO CHANGES ? ?Dr. Debara Pickett will discuss lipid management at your next visit - possibly [place] ? ?*If you need a refill on your cardiac medications before your next appointment, please call your pharmacy* ? ? ?Follow-Up: ?At [facility], you and your health needs are our priority.  As part of our continuing mission to provide you with exceptional heart care, we have created designated Provider Care Teams.  These Care Teams include your primary Cardiologist (physician) and Advanced Practice Providers (APPs -  Physician Assistants and Nurse Practitioners) who all work together to provide you with the care you need, when you need it. ? ?We recommend signing up for the patient portal called "MyChart".  Sign up information is provided on this After Visit Summary.  MyChart is used to connect with patients for Virtual Visits (Telemedicine).  Patients are able to view lab/test results, encounter notes, upcoming appointments, etc.  Non-urgent messages can be sent to your provider as well.   ?To learn more about what you can do with MyChart, go to [email or web address].   ? ?Your next appointment:   ? ?6 months with Dr. Debara Pickett ? ?Other Instructions ? ? ?

## 2021-05-19 ENCOUNTER — Ambulatory Visit (INDEPENDENT_AMBULATORY_CARE_PROVIDER_SITE_OTHER): Payer: Medicare Other | Admitting: Cardiology

## 2021-05-19 ENCOUNTER — Encounter: Payer: Self-pay | Admitting: *Deleted

## 2021-05-19 ENCOUNTER — Encounter: Payer: Self-pay | Admitting: Cardiology

## 2021-05-19 ENCOUNTER — Other Ambulatory Visit: Payer: Self-pay

## 2021-05-19 VITALS — BP 120/60 | HR 54 | Ht 65.0 in | Wt 166.8 lb

## 2021-05-19 DIAGNOSIS — I493 Ventricular premature depolarization: Secondary | ICD-10-CM | POA: Diagnosis not present

## 2021-05-19 DIAGNOSIS — I480 Paroxysmal atrial fibrillation: Secondary | ICD-10-CM | POA: Diagnosis not present

## 2021-05-19 NOTE — Patient Instructions (Addendum)
Medication Instructions:  ?Your physician recommends that you continue on your current medications as directed. Please refer to the Current Medication list given to you today. ?*If you need a refill on your cardiac medications before your next appointment, please call your pharmacy* ? ?Lab Work: ?None. ?If you have labs (blood work) drawn today and your tests are completely normal, you will receive your results only by: ?MyChart Message (if you have MyChart) OR ?A paper copy in the mail ?If you have any lab test that is abnormal or we need to change your treatment, we will call you to review the results. ? ?Testing/Procedures: ?None. ? ?Follow-Up: ?At [facility], you and your health needs are our priority.  As part of our continuing mission to provide you with exceptional heart care, we have created designated Provider Care Teams.  These Care Teams include your primary Cardiologist (physician) and Advanced Practice Providers (APPs -  Physician Assistants and Nurse Practitioners) who all work together to provide you with the care you need, when you need it. ? ?Your physician wants you to follow-up in: post procedure Appts will be made for you around the time of your watchman.  ? ?We recommend signing up for the patient portal called "MyChart".  Sign up information is provided on this After Visit Summary.  MyChart is used to connect with patients for Virtual Visits (Telemedicine).  Patients are able to view lab/test results, encounter notes, upcoming appointments, etc.  Non-urgent messages can be sent to your provider as well.   ?To learn more about what you can do with MyChart, go to [email or web address].   ? ?Any Other Special Instructions Will Be Listed Below (If Applicable). ? ? ? ? ?  ? ? ?

## 2021-05-19 NOTE — Progress Notes (Signed)
Electrophysiology Office Follow up Visit Note:    Date:  05/19/2021   ID:  Lorraine [name], DOB [birth year], MRN [id number]  PCP:  [name], MD  [facility] Cardiologist:  [name], MD  [facility] Electrophysiologist:  Vickie Epley, MD    Interval History:    Lorraine [name] is a 71 y.o. female who presents for a follow up visit. They were last seen in clinic 01/15/2021. Since their last appointment, they underwent successful PVI ablation 04/09/2021. They are scheduled for a left atrial appendage occlusion on 06/11/2021.  Overall, she is feeling good. A few times she felt her heart rate was faster, but this resolved after getting up and moving. She believes her symptoms may have been related to feeling nervous rather than arrhythmia.  Lately she has had some success with weight loss. She plans to keep working on this.  She reports issues with urine retention following her previous procedures, and asks about using a catheter following her left atrial appendage occlusion.  She denies any chest pain, shortness of breath, or peripheral edema. No lightheadedness, headaches, syncope, orthopnea, or PND.      Past Medical History:  Diagnosis Date   Anxiety    Aortic atherosclerosis ([place]) 02/03/2018   Noted on CT Abd/Pelvis   AR (aortic regurgitation) 07/18/2017   Mild, noted on ECHO   Bilateral cataracts    Bilateral plantar fasciitis    Chronic knee pain    Chronic low back pain    Chronic lumbar radiculopathy 07/14/2017   Mild, L5, noted on electromyography   History of palpitations    2014-- low risk exercise tolerence test, no ischemi, PVC's (09-06-2012)   HSV-1 (herpes simplex virus 1) infection    Cold sore   Hx of colonic polyps    Hyperlipidemia    Insomnia    Lactose intolerance    Left nephrolithiasis 02/03/2018   Nonobstructing, noted on CT Abd/pelvis   Morton's metatarsalgia, neuralgia, or neuroma, bilateral    Osteoarthritis    knees  and lumbar, feet   Osteopenia    Peripheral neuropathy    bilateral feet -- burning and stinging   PONV (postoperative nausea and vomiting)    severe   PVC's (premature ventricular contractions)    Systolic murmur    "Slight"   Vaginal cyst    Wears glasses     Past Surgical History:  Procedure Laterality Date   ANTERIOR CERVICAL DECOMP/DISCECTOMY FUSION  01/2014   C4 -- C6   ATRIAL FIBRILLATION ABLATION N/A 04/09/2021   Procedure: ATRIAL FIBRILLATION ABLATION;  Surgeon: Vickie Epley, MD;  Location: [place] [facility];  Service: Cardiovascular;  Laterality: N/A;   CATARACT EXTRACTION W/ INTRAOCULAR LENS  IMPLANT, BILATERAL  2015   COLONOSCOPY  05/14/2015   CYSTO WITH HYDRODISTENSION N/A 04/14/2018   Procedure: CYSTOSCOPY/HYDRODISTENSION WITH INSTILLATION OF PYRIDIUM AND MARCAINE, BLADDER BIOPSY WITH FULGERATION 0.5 TO 2 CM;  Surgeon: [name], MD;  Location: [facility];  Service: Urology;  Laterality: N/A;   ELBOW DEBRIDEMENT Right 09/10/2010   and tendon debridement and radial tunnel release   EXCISION MORTON'S NEUROMA Bilateral left 02-20-2009/  right & left 05-08-2009   EXCISION VAGINAL CYST N/A 07/01/2016   Procedure: EXCISION VAGINAL CYST;  Surgeon: Tyson Dense, MD;  Location: [facility];  Service: Gynecology;  Laterality: N/A;   PLANTAR FASCIA RELEASE Bilateral left 04-02-2010/  right 04-02-2016   TONSILLECTOMY AND ADENOIDECTOMY  child  TOTAL ABDOMINAL HYSTERECTOMY  1974   w/  Left salpingoophorectomy and Partial right salpingoophorectomy    Current Medications: Current Meds  Medication Sig   ALPRAZolam (XANAX) 0.5 MG tablet Take 1 tablet (0.5 mg total) by mouth 2 (two) times daily as needed for anxiety.   cyanocobalamin (,VITAMIN B-12,) 1000 MCG/ML injection Inject 1 mL (1,000 mcg total) into the muscle once a week.   gabapentin (NEURONTIN) 300 MG capsule Take 300 mg by mouth at bedtime.   meloxicam (MOBIC) 15 MG  tablet Take 1 tablet (15 mg total) by mouth daily. (Patient taking differently: Take 7.5 mg by mouth daily.)   metoprolol tartrate (LOPRESSOR) 25 MG tablet TAKE 1/2-1 TABLET BY MOUTH EVERY 6-8HOURS FOR BREAKTHROUGH AFIB FOR HEART RATES OVER 100   polyvinyl alcohol (LIQUIFILM TEARS) 1.4 % ophthalmic solution Place 1 drop into both eyes as needed for dry eyes.   rivaroxaban (XARELTO) 20 MG TABS tablet Take 1 tablet (20 mg total) by mouth daily with supper.   rosuvastatin (CRESTOR) 10 MG tablet Take 1 tablet (10 mg total) by mouth daily.   valACYclovir (VALTREX) 1000 MG tablet Take 1 tablet (1,000 mg total) by mouth daily. (Patient taking differently: Take 1,000 mg by mouth daily as needed (Cold sore).)   zolpidem (AMBIEN) 10 MG tablet Take 1 tablet (10 mg total) by mouth at bedtime.     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  Has one son.  Works as a Counsellor for [employer].  Education: some college.    Social Determinants of Health   Financial Resource Strain: Not on file  Food Insecurity: Not on file  Transportation Needs: Not on file  Physical Activity: Not on file  Stress: Not on file  Social Connections: Not on file     Family History: The patient's family history includes Cancer in her paternal grandfather; Heart attack in her father; Lung cancer in her  mother; Memory loss in her father; Multiple sclerosis in her brother.  ROS:   Please see the history of present illness.    All other systems reviewed and are negative.  EKGs/Labs/Other Studies Reviewed:    The following studies were reviewed today:  Atrial Fibrillation Ablation 04/09/2021: CONCLUSIONS: 1. Successful PVI 2. Intracardiac echo reveals trivial pericardial effusion, normal left atrial architecture 3. No early apparent complications. 4.  Outpatient follow-up for timing of left atrial appendage occlusion  Cardiac CTA 04/06/2021: FINDINGS: Image quality: excellent.   Noise artifact is: Limited.   Left Atrium: The left atrial size is dilated. There is no PFO/ASD. There is normal pulmonary vein drainage into the left atrium (2 on the right and 2 on the left).   Left Atrial Appendage:   Morphology: The left atrial appendage is  large windsock with 1 lobes. Thrombus: There is no thrombus in the left atrial appendage on contrast or delayed imaging.   The following measurements were made regarding left atrial appendage closure:   Phase assessed: 30%   Landing Zone measurement: 26.1 mm x 22.3 mm   LAA Length (maximum): 21.8 mm   Optimal interatrial septum puncture site: Mid and posterior   Optimal deployment angle: RAO 13 CRA 13   Catheter: A double curve catheter is recommended.   Watchman FLX Device: A 31 mm device is recommended with 16% compression.   Other comments: None.   Coronary Arteries: CAC score of 35.4, which is 59th percentile for age-, sex-, and race-matched controls. Normal coronary origin. Right dominance. The study was performed without use of NTG and is insufficient for plaque evaluation.   Right Atrium: Right atrial size is within normal limits.   Right Ventricle: The right ventricular cavity is within normal limits.   Left Ventricle: The ventricular cavity size is within normal limits.   Pulmonary Artery: Normal caliber without  proximal filling defect.   Cardiac valves: The aortic valve is trileaflet without significant calcification. The mitral valve is normal structure without significant calcification.   Aorta: Normal caliber with no significant disease.   Pericardium: Normal thickness with no significant effusion or calcium present.   Extra-cardiac findings: See attached radiology report for non-cardiac structures.   IMPRESSION: 1. The left atrial appendage is large windsock type.   2. A 31 mm Watchman FLX device is recommended based on the above landing zone measurements (26.1 mm maximum diameter; 16% compression).   3. There is no thrombus in the left atrial appendage.   4. Mid and posterior IAS stick is recommended.   5. Optimal deployment angle: RAO 13 CRA 13   6. Normal coronary origin. Right dominance. CAC score of 35.4, which is 59th percentile for age-, sex-, and race-matched controls.  Echo 02/04/2021:  1. Left ventricular ejection fraction, by estimation, is 65 to 70%. The  left ventricle has normal function. The left ventricle has no regional  wall motion abnormalities. Left ventricular diastolic parameters were  normal.   2. Right ventricular systolic function is normal. The right ventricular  size is normal.   3. The mitral valve is normal in structure. Trivial mitral valve  regurgitation.   4. The aortic valve is normal in structure. Aortic valve regurgitation is  mild to moderate. No aortic stenosis is present.   5. Aortic dilatation noted. There is mild dilatation of the ascending  aorta, measuring 41 mm.    EKG:  EKG is personally reviewed.  05/19/2021: Sinus rhythm.  PVC.  Recent Labs: 05/28/2020: ALT 17; TSH 1.48 03/19/2021: BUN 28; Creatinine, Ser 0.65; Hemoglobin 14.4; Platelets 254; Potassium 4.1; Sodium 139   Recent Lipid Panel    Component Value Date/Time   CHOL 174 05/28/2020 0858   TRIG 77.0 05/28/2020 0858   HDL 51.50 05/28/2020 0858   CHOLHDL 3 05/28/2020  0858   VLDL 15.4 05/28/2020 0858   LDLCALC 107 (H) 05/28/2020 0858    Physical Exam:    VS:  BP 120/60    Pulse (!) 54    Ht '5\' 5"'$  (1.651 m)    Wt 166 lb 12.8 oz (75.7 kg)    SpO2 97%    BMI 27.76 kg/m     Wt Readings from Last 3 Encounters:  05/19/21 166 lb 12.8 oz (75.7 kg)  05/15/21 164 lb (74.4 kg)  05/05/21 168 lb 6.4  oz (76.4 kg)     GEN: Well nourished, well developed in no acute distress HEENT: Normal NECK: No JVD; No carotid bruits LYMPHATICS: No lymphadenopathy CARDIAC: RRR, no murmurs, rubs, gallops RESPIRATORY:  Clear to auscultation without rales, wheezing or rhonchi  ABDOMEN: Soft, non-tender, non-distended MUSCULOSKELETAL:  No edema; No deformity  SKIN: Warm and dry NEUROLOGIC:  Alert and oriented x 3 PSYCHIATRIC:  Normal affect        ASSESSMENT:    1. Paroxysmal atrial fibrillation (HCC)   2. PVC's (premature ventricular contractions)    PLAN:    In order of problems listed above:  #Paroxysmal atrial fibrillation Doing well after her atrial fibrillation ablation without recurrence.  She continues to take Xarelto for stroke prophylaxis but desires a long-term stroke mitigation strategy that avoids long-term exposure anticoagulation.  She is scheduled for watchman at the end of the month.  I have seen [name] in the office today who is being considered for a Watchman left atrial appendage closure device. I believe they will benefit from this procedure given their history of atrial fibrillation, CHA2DS2-VASc score of 2 and unadjusted ischemic stroke rate of 2.9% per year. The patient's chart has been reviewed and I feel that they would be a candidate for short term oral anticoagulation after Watchman implant.  She would like to avoid long-term use of anticoagulation given the prohibitive cost of the NOACs.   It is my belief that after undergoing a LAA closure procedure, [name] will not need long term anticoagulation which eliminates  anticoagulation side effects and major bleeding risk.   Procedural risks for the Watchman implant have been reviewed with the patient including a 0.5% risk of stroke, <1% risk of perforation and <1% risk of device embolization.      The published clinical data on the safety and effectiveness of WATCHMAN include but are not limited to the following: - Holmes DR, Mechele Claude, Sick P et al. for the PROTECT AF Investigators. Percutaneous closure of the left atrial appendage versus warfarin therapy for prevention of stroke in patients with atrial fibrillation: a randomised non-inferiority trial. Lancet 2009; 374: 534-42. Mechele Claude, Doshi SK, Abelardo Diesel D et al. on behalf of the PROTECT AF Investigators. Percutaneous Left Atrial Appendage Closure for Stroke Prophylaxis in Patients With Atrial Fibrillation 2.3-Year Follow-up of the PROTECT AF (Watchman Left Atrial Appendage System for Embolic Protection in Patients With Atrial Fibrillation) Trial. Circulation 2013; 127:720-729. - Alli O, Doshi S,  Kar S, Reddy VY, Sievert H et al. Quality of Life Assessment in the Randomized PROTECT AF (Percutaneous Closure of the Left Atrial Appendage Versus Warfarin Therapy for Prevention of Stroke in Patients With Atrial Fibrillation) Trial of Patients at Risk for Stroke With Nonvalvular Atrial Fibrillation. J Am Coll Cardiol 2013; 16:6063-0. Vertell Limber DR, Tarri Abernethy, Price M, [place], Sievert H, Doshi S, Huber K, Reddy V. Prospective randomized evaluation of the Watchman left atrial appendage Device in patients with atrial fibrillation versus long-term warfarin therapy; the PREVAIL trial. Journal of the [employer] of Cardiology, Vol. 4, No. 1, 2014, 1-11. - Kar S, Doshi SK, Sadhu A, Horton R, Osorio J et al. Primary outcome evaluation of a next-generation left atrial appendage closure device: results from the PINNACLE FLX trial. Circulation 2021;143(18)1754-1762.      After today's visit with the patient which was  dedicated solely for shared decision making visit regarding LAA closure device, the patient decided to think over the watchman procedure  and let us know if she would like to proceed.  In the meantime we will proceed with pulmonary vein isolation procedure.        HAS-BLED score 1 Hypertension No  Abnormal renal and liver function (Dialysis, transplant, Cr >2.26 mg/dL /Cirrhosis or Bilirubin >2x Normal or AST/ALT/AP >3x Normal) No  Stroke No  Bleeding No  Labile INR (Unstable/high INR) No  Elderly (>65) Yes  Drugs or alcohol (? 8 drinks/week, anti-plt or NSAID) No       Medication Adjustments/Labs and Tests Ordered: Current medicines are reviewed at length with the patient today.  Concerns regarding medicines are outlined above.  Orders Placed This Encounter  Procedures   EKG 12-Lead   No orders of the defined types were placed in this encounter.   I,Mathew Stumpf,acting as a Education administrator for Vickie Epley, MD.,have documented all relevant documentation on the behalf of Vickie Epley, MD,as directed by  Vickie Epley, MD while in the presence of Vickie Epley, MD.  I, Vickie Epley, MD, have reviewed all documentation for this visit. The documentation on 05/19/21 for the exam, diagnosis, procedures, and orders are all accurate and complete.   Signed, Lars Mage, MD, [facility], [facility] 05/19/2021 8:28 PM    Electrophysiology [facility]

## 2021-05-19 NOTE — H&P (View-Only) (Signed)
?Electrophysiology Office Follow up Visit Note:   ? ?Date:  05/19/2021  ? ?ID:  Lorraine [name], DOB 1951/01/10, MRN [id number] ? ?PCP:  [name], MD  ?[facility] Cardiologist:  [name], MD  ?[facility] Electrophysiologist:  Vickie Epley, MD  ? ? ?Interval History:   ? ?Lorraine [name] is a 71 y.o. female who presents for a follow up visit. They were last seen in clinic 01/15/2021. ?Since their last appointment, they underwent successful PVI ablation 04/09/2021. They are scheduled for a left atrial appendage occlusion on 06/11/2021. ? ?Overall, she is feeling good. A few times she felt her heart rate was faster, but this resolved after getting up and moving. She believes her symptoms may have been related to feeling nervous rather than arrhythmia. ? ?Lately she has had some success with weight loss. She plans to keep working on this. ? ?She reports issues with urine retention following her previous procedures, and asks about using a catheter following her left atrial appendage occlusion. ? ?She denies any chest pain, shortness of breath, or peripheral edema. No lightheadedness, headaches, syncope, orthopnea, or PND. ? ? ?  ? ?Past Medical History:  ?Diagnosis Date  ? Anxiety   ? Aortic atherosclerosis ([place]) 02/03/2018  ? Noted on CT Abd/Pelvis  ? AR (aortic regurgitation) 07/18/2017  ? Mild, noted on ECHO  ? Bilateral cataracts   ? Bilateral plantar fasciitis   ? Chronic knee pain   ? Chronic low back pain   ? Chronic lumbar radiculopathy 07/14/2017  ? Mild, L5, noted on electromyography  ? History of palpitations   ? 2014-- low risk exercise tolerence test, no ischemi, PVC's (09-06-2012)  ? HSV-1 (herpes simplex virus 1) infection   ? Cold sore  ? Hx of colonic polyps   ? Hyperlipidemia   ? Insomnia   ? Lactose intolerance   ? Left nephrolithiasis 02/03/2018  ? Nonobstructing, noted on CT Abd/pelvis  ? Morton's metatarsalgia, neuralgia, or neuroma, bilateral   ? Osteoarthritis   ? knees  and lumbar, feet  ? Osteopenia   ? Peripheral neuropathy   ? bilateral feet -- burning and stinging  ? PONV (postoperative nausea and vomiting)   ? severe  ? PVC's (premature ventricular contractions)   ? Systolic murmur   ? "Slight"  ? Vaginal cyst   ? Wears glasses   ? ? ?Past Surgical History:  ?Procedure Laterality Date  ? ANTERIOR CERVICAL DECOMP/DISCECTOMY FUSION  01/2014  ? C4 -- C6  ? ATRIAL FIBRILLATION ABLATION N/A 04/09/2021  ? Procedure: ATRIAL FIBRILLATION ABLATION;  Surgeon: Vickie Epley, MD;  Location: [place] [facility];  Service: Cardiovascular;  Laterality: N/A;  ? CATARACT EXTRACTION W/ INTRAOCULAR LENS  IMPLANT, BILATERAL  2015  ? COLONOSCOPY  05/14/2015  ? CYSTO WITH HYDRODISTENSION N/A 04/14/2018  ? Procedure: CYSTOSCOPY/HYDRODISTENSION WITH INSTILLATION OF PYRIDIUM AND MARCAINE, BLADDER BIOPSY WITH FULGERATION 0.5 TO 2 CM;  Surgeon: [name], MD;  Location: [facility];  Service: Urology;  Laterality: N/A;  ? ELBOW DEBRIDEMENT Right 09/10/2010  ? and tendon debridement and radial tunnel release  ? EXCISION MORTON'S NEUROMA Bilateral left 02-20-2009/  right & left 05-08-2009  ? EXCISION VAGINAL CYST N/A 07/01/2016  ? Procedure: EXCISION VAGINAL CYST;  Surgeon: Tyson Dense, MD;  Location: [facility];  Service: Gynecology;  Laterality: N/A;  ? PLANTAR FASCIA RELEASE Bilateral left 04-02-2010/  right 04-02-2016  ? TONSILLECTOMY AND ADENOIDECTOMY  child  ?  TOTAL ABDOMINAL HYSTERECTOMY  1974  ? w/  Left salpingoophorectomy and Partial right salpingoophorectomy  ? ? ?Current Medications: ?Current Meds  ?Medication Sig  ? ALPRAZolam (XANAX) 0.5 MG tablet Take 1 tablet (0.5 mg total) by mouth 2 (two) times daily as needed for anxiety.  ? cyanocobalamin (,VITAMIN B-12,) 1000 MCG/ML injection Inject 1 mL (1,000 mcg total) into the muscle once a week.  ? gabapentin (NEURONTIN) 300 MG capsule Take 300 mg by mouth at bedtime.  ? meloxicam (MOBIC) 15 MG  tablet Take 1 tablet (15 mg total) by mouth daily. (Patient taking differently: Take 7.5 mg by mouth daily.)  ? metoprolol tartrate (LOPRESSOR) 25 MG tablet TAKE 1/2-1 TABLET BY MOUTH EVERY 6-8HOURS FOR BREAKTHROUGH AFIB FOR HEART RATES OVER 100  ? polyvinyl alcohol (LIQUIFILM TEARS) 1.4 % ophthalmic solution Place 1 drop into both eyes as needed for dry eyes.  ? rivaroxaban (XARELTO) 20 MG TABS tablet Take 1 tablet (20 mg total) by mouth daily with supper.  ? rosuvastatin (CRESTOR) 10 MG tablet Take 1 tablet (10 mg total) by mouth daily.  ? valACYclovir (VALTREX) 1000 MG tablet Take 1 tablet (1,000 mg total) by mouth daily. (Patient taking differently: Take 1,000 mg by mouth daily as needed (Cold sore).)  ? zolpidem (AMBIEN) 10 MG tablet Take 1 tablet (10 mg total) by mouth at bedtime.  ?  ? ?Allergies:   Amitriptyline, Codeine, and Nortriptyline  ? ?Social History  ? ?Socioeconomic History  ? Marital status: Divorced  ?  Spouse name: Not on file  ? Number of children: 1  ? Years of education: 85  ? Highest education level: Not on file  ?Occupational History  ? 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  Employer: [employer]  ?Tobacco Use  ? Smoking status: Never  ? Smokeless tobacco: Never  ?Vaping Use  ? Vaping Use: Never used  ?Substance and Sexual Activity  ? Alcohol use: Yes  ?  Comment: rare  ? Drug use: No  ? Sexual activity: Not on file  ?Other Topics Concern  ? Not on file  ?Social History Narrative  ? Lives alone in a one story home.  Has one son.  Works as a Counsellor for [employer].  Education: some college.   ? ?Social Determinants of Health  ? ?Financial Resource Strain: Not on file  ?Food Insecurity: Not on file  ?Transportation Needs: Not on file  ?Physical Activity: Not on file  ?Stress: Not on file  ?Social Connections: Not on file  ?  ? ?Family History: ?The patient's family history includes Cancer in her paternal grandfather; Heart attack in her father; Lung cancer in her  mother; Memory loss in her father; Multiple sclerosis in her brother. ? ?ROS:   ?Please see the history of present illness.    ?All other systems reviewed and are negative. ? ?EKGs/Labs/Other Studies Reviewed:   ? ?The following studies were reviewed today: ? ?Atrial Fibrillation Ablation 04/09/2021: ?CONCLUSIONS: ?1. Successful PVI ?2. Intracardiac echo reveals trivial pericardial effusion, normal left atrial architecture ?3. No early apparent complications. ?4.  Outpatient follow-up for timing of left atrial appendage occlusion ? ?Cardiac CTA 04/06/2021: ?FINDINGS: ?Image quality: excellent. ?  ?Noise artifact is: Limited. ?  ?Left Atrium: The left atrial size is dilated. There is no PFO/ASD. ?There is normal pulmonary vein drainage into the left atrium (2 on ?the right and 2 on the left). ?  ?Left Atrial Appendage: ?  ?Morphology: The left atrial appendage is  large windsock with 1 ?lobes. ?Thrombus: There is no thrombus in the left atrial appendage on ?contrast or delayed imaging. ?  ?The following measurements were made regarding left atrial appendage ?closure: ?  ?Phase assessed: 30% ?  ?Landing Zone measurement: 26.1 mm x 22.3 mm ?  ?LAA Length (maximum): 21.8 mm ?  ?Optimal interatrial septum puncture site: Mid and posterior ?  ?Optimal deployment angle: RAO 13 CRA 13 ?  ?Catheter: A double curve catheter is recommended. ?  ?Watchman FLX Device: A 31 mm device is recommended with 16% ?compression. ?  ?Other comments: None. ?  ?Coronary Arteries: CAC score of 35.4, which is 59th percentile for ?age-, sex-, and race-matched controls. Normal coronary origin. Right ?dominance. The study was performed without use of NTG and is ?insufficient for plaque evaluation. ?  ?Right Atrium: Right atrial size is within normal limits. ?  ?Right Ventricle: The right ventricular cavity is within normal ?limits. ?  ?Left Ventricle: The ventricular cavity size is within normal limits. ?  ?Pulmonary Artery: Normal caliber without  proximal filling defect. ?  ?Cardiac valves: The aortic valve is trileaflet without significant ?calcification. The mitral valve is normal structure without ?significant calcification. ?  ?Aorta: Normal calib

## 2021-06-02 ENCOUNTER — Telehealth: Payer: Self-pay

## 2021-06-02 ENCOUNTER — Ambulatory Visit (INDEPENDENT_AMBULATORY_CARE_PROVIDER_SITE_OTHER): Payer: Medicare Other | Admitting: Family Medicine

## 2021-06-02 ENCOUNTER — Other Ambulatory Visit: Payer: Self-pay

## 2021-06-02 ENCOUNTER — Encounter: Payer: Self-pay | Admitting: Family Medicine

## 2021-06-02 VITALS — BP 124/72 | HR 55 | Temp 97.4°F | Resp 16 | Wt 164.2 lb

## 2021-06-02 DIAGNOSIS — I480 Paroxysmal atrial fibrillation: Secondary | ICD-10-CM

## 2021-06-02 DIAGNOSIS — E663 Overweight: Secondary | ICD-10-CM | POA: Diagnosis not present

## 2021-06-02 DIAGNOSIS — E785 Hyperlipidemia, unspecified: Secondary | ICD-10-CM | POA: Diagnosis not present

## 2021-06-02 LAB — CBC WITH DIFFERENTIAL/PLATELET
Basophils Absolute: 0.1 10*3/uL (ref 0.0–0.1)
Basophils Relative: 1.3 % (ref 0.0–3.0)
Eosinophils Absolute: 0.3 10*3/uL (ref 0.0–0.7)
Eosinophils Relative: 4.3 % (ref 0.0–5.0)
HCT: 43.5 % (ref 36.0–46.0)
Hemoglobin: 14.3 g/dL (ref 12.0–15.0)
Lymphocytes Relative: 31 % (ref 12.0–46.0)
Lymphs Abs: 2 10*3/uL (ref 0.7–4.0)
MCHC: 32.9 g/dL (ref 30.0–36.0)
MCV: 84.3 fl (ref 78.0–100.0)
Monocytes Absolute: 0.4 10*3/uL (ref 0.1–1.0)
Monocytes Relative: 5.6 % (ref 3.0–12.0)
Neutro Abs: 3.7 10*3/uL (ref 1.4–7.7)
Neutrophils Relative %: 57.8 % (ref 43.0–77.0)
Platelets: 242 10*3/uL (ref 150.0–400.0)
RBC: 5.16 Mil/uL — ABNORMAL HIGH (ref 3.87–5.11)
RDW: 15.4 % (ref 11.5–15.5)
WBC: 6.3 10*3/uL (ref 4.0–10.5)

## 2021-06-02 LAB — BASIC METABOLIC PANEL
BUN: 21 mg/dL (ref 6–23)
CO2: 23 mEq/L (ref 19–32)
Calcium: 10.1 mg/dL (ref 8.4–10.5)
Chloride: 105 mEq/L (ref 96–112)
Creatinine, Ser: 0.62 mg/dL (ref 0.40–1.20)
GFR: 89.9 mL/min (ref >60.00)
Glucose, Bld: 80 mg/dL (ref 70–99)
Potassium: 4.4 mEq/L (ref 3.5–5.1)
Sodium: 140 mEq/L (ref 135–145)

## 2021-06-02 LAB — HEPATIC FUNCTION PANEL
ALT: 13 U/L (ref 0–35)
AST: 18 U/L (ref 0–37)
Albumin: 4.6 g/dL (ref 3.5–5.2)
Alkaline Phosphatase: 93 U/L (ref 39–117)
Bilirubin, Direct: 0.2 mg/dL (ref 0.0–0.3)
Total Bilirubin: 1 mg/dL (ref 0.2–1.2)
Total Protein: 6.9 g/dL (ref 6.0–8.3)

## 2021-06-02 LAB — LIPID PANEL
Cholesterol: 199 mg/dL (ref 0–200)
HDL: 54.8 mg/dL (ref >39.00)
LDL Cholesterol: 131 mg/dL — ABNORMAL HIGH (ref 0–99)
NonHDL: 144.27
Total CHOL/HDL Ratio: 4
Triglycerides: 64 mg/dL (ref 0.0–149.0)
VLDL: 12.8 mg/dL (ref 0.0–40.0)

## 2021-06-02 LAB — TSH: TSH: 1 u[IU]/mL (ref 0.35–5.50)

## 2021-06-02 MED ORDER — MELOXICAM 15 MG PO TABS
15.0000 mg | ORAL_TABLET | Freq: Every day | ORAL | 1 refills | Status: DC
Start: 2021-06-02 — End: 2021-09-21

## 2021-06-02 MED ORDER — ALPRAZOLAM 0.5 MG PO TABS: 0.5000 mg | ORAL_TABLET | Freq: Two times a day (BID) | ORAL | 1 refills | Status: DC | PRN

## 2021-06-02 MED ORDER — ROSUVASTATIN CALCIUM 10 MG PO TABS: 10.0000 mg | ORAL_TABLET | Freq: Every day | ORAL | 1 refills | Status: DC

## 2021-06-02 NOTE — Telephone Encounter (Signed)
The patient called to confirm she had her blood work drawn today. ? ?While on the phone, she stated that she has to work next week prior to [place] and would be unable to talk. ? ?Confirmed she does NOT need to have a Covid test. She understands to call if she starts to have Covid symptoms or comes in contact with a person who is Covid positive. ?Confirmed arrival time of 0900 for 1130 case. ?Reviewed medication instructions. ?She was grateful for call and agrees with plan.  ?

## 2021-06-02 NOTE — Progress Notes (Signed)
? ?  Subjective:  ? ? Patient ID: Lorraine [name], female    DOB: 06-22-1950, 71 y.o.   MRN: [id number] ? ?HPI ?Hyperlipidemia- chronic problem, on Crestor '10mg'$  daily.  Pt is talking w/ Dr Debara Pickett about possibly switching to injxns (Repatha, Praluent)  Some muscle aches w/ statins.  Denies abd pain, N/V. ? ?Afib- chronic problem, follows w/ Cards.  S/p ablation.  Pt reports feeling much better.  Now on Metoprolol '25mg'$  as needed for breakthrough palpitations and Xarelto.  Has Watchman procedure next week. ? ?Overweight- pt has lost 3 lbs since 3/7 and BMI is now 27.32  Pt is working on weight loss w/ diet and physical activity.  Pt has cut out almost all processed foods. ? ? ?Review of Systems ?For ROS see HPI  ? ?This visit occurred during the SARS-CoV-2 public health emergency.  Safety protocols were in place, including screening questions prior to the visit, additional usage of staff PPE, and extensive cleaning of exam room while observing appropriate contact time as indicated for disinfecting solutions.   ?   ?Objective:  ? Physical Exam ?Vitals reviewed.  ?Constitutional:   ?   General: She is not in acute distress. ?   Appearance: Normal appearance. She is well-developed. She is not ill-appearing.  ?HENT:  ?   Head: Normocephalic and atraumatic.  ?Eyes:  ?   Conjunctiva/sclera: Conjunctivae normal.  ?   Pupils: Pupils are equal, round, and reactive to light.  ?Neck:  ?   Thyroid: No thyromegaly.  ?Cardiovascular:  ?   Rate and Rhythm: Normal rate and regular rhythm.  ?   Pulses: Normal pulses.  ?   Heart sounds: Normal heart sounds. No murmur heard. ?Pulmonary:  ?   Effort: Pulmonary effort is normal. No respiratory distress.  ?   Breath sounds: Normal breath sounds.  ?Abdominal:  ?   General: There is no distension.  ?   Palpations: Abdomen is soft.  ?   Tenderness: There is no abdominal tenderness.  ?Musculoskeletal:  ?   Cervical back: Normal range of motion and neck supple.  ?   Right lower leg: No edema.  ?    Left lower leg: No edema.  ?Lymphadenopathy:  ?   Cervical: No cervical adenopathy.  ?Skin: ?   General: Skin is warm and dry.  ?Neurological:  ?   Mental Status: She is alert and oriented to person, place, and time.  ?Psychiatric:     ?   Behavior: Behavior normal.  ? ? ? ? ? ?   ?Assessment & Plan:  ? ? ?

## 2021-06-02 NOTE — Assessment & Plan Note (Signed)
Chronic problem, on Crestor '10mg'$  daily.  Has some muscle aches w/ statins but less w/ crestor than previous statins.  Will discuss alternatives w/ Dr Debara Pickett at her appt in the fall.  Check labs.  Adjust meds prn  ?

## 2021-06-02 NOTE — Assessment & Plan Note (Signed)
S/p ablation and feeling much better.  Has not had to take Metoprolol.  Has Watchman procedure upcoming next week so she can stop Xarelto.  Will follow along. ?

## 2021-06-02 NOTE — Patient Instructions (Signed)
Follow up in 6 months to recheck weight loss progress and cholesterol ?We'll notify you of your lab results and make any changes if needed ?Keep up the good work on healthy diet and regular exercise- you look great!!! ?Call with any questions or concerns ?Stay Safe!  Stay Healthy! ?Happy Early Birthday!!! ?

## 2021-06-02 NOTE — Assessment & Plan Note (Signed)
Pt is down 3 lbs in 2 weeks.  Reports she is working intensely on healthy diet- has eliminated processed foods.  Is also adding some exercise since her ablation.  Applauded her efforts and encouraged her to continue. ?

## 2021-06-03 ENCOUNTER — Telehealth: Payer: Self-pay

## 2021-06-03 NOTE — Telephone Encounter (Signed)
Patient returned phone call, she said if it was in regards to lab results then we can disregard but has seen the messages Dr.Tabori put.  ?

## 2021-06-10 ENCOUNTER — Other Ambulatory Visit (HOSPITAL_COMMUNITY): Payer: Medicare Other

## 2021-06-10 ENCOUNTER — Encounter (HOSPITAL_COMMUNITY): Payer: Self-pay | Admitting: Cardiology

## 2021-06-10 NOTE — Progress Notes (Signed)
PCP - Dr Annye Asa ?Cardiologist - Dr Debara Pickett ?Primary EP: Dr Quentin Ore ?Oncology - Dr Nicholas Lose ? ?Chest x-ray - DOS (2V) ?EKG - 05/19/21 ?Stress Test - 04/28/18 ?ECHO TEE - DOS ?Cardiac Cath - n/a ? ?ICD Pacemaker/Loop - n/a ? ?Sleep Study -  n/a ?CPAP - none ? ?ERAS: No solid food after midnight tonight and clear liquids til 5 AM DOS per MD. ? ?Anesthesia review: Yes ? ?STOP now taking any Aspirin (unless otherwise instructed by your surgeon), Aleve, Naproxen, Ibuprofen, Motrin, Advil, Goody's, BC's, all herbal medications, fish oil, and all vitamins.  ? ?Blood Thinner Instructions:  Patient to follow surgeon's instructions on when to stop Eliquis prior to surgery. ? ?Coronavirus Screening ?Covid test N/A ?

## 2021-06-11 ENCOUNTER — Encounter (HOSPITAL_COMMUNITY)
Admission: RE | Disposition: A | Discharge status: Home / Self Care | Payer: Medicare Other | Source: Home / Self Care | Attending: Cardiology

## 2021-06-11 ENCOUNTER — Other Ambulatory Visit: Payer: Self-pay

## 2021-06-11 ENCOUNTER — Encounter (HOSPITAL_COMMUNITY): Payer: Self-pay | Admitting: Cardiology

## 2021-06-11 ENCOUNTER — Inpatient Hospital Stay (HOSPITAL_COMMUNITY): Payer: Medicare Other

## 2021-06-11 ENCOUNTER — Inpatient Hospital Stay (HOSPITAL_COMMUNITY)
Admission: RE | Admit: 2021-06-11 | Discharge: 2021-06-12 | DRG: 274 | Disposition: A | Discharge status: Home / Self Care | Payer: Medicare Other | Attending: Cardiology | Admitting: Cardiology

## 2021-06-11 ENCOUNTER — Inpatient Hospital Stay (HOSPITAL_COMMUNITY): Payer: Medicare Other | Admitting: Physician Assistant

## 2021-06-11 ENCOUNTER — Inpatient Hospital Stay (HOSPITAL_COMMUNITY)
Admission: RE | Admit: 2021-06-11 | Discharge: 2021-06-11 | Disposition: A | Discharge status: Home / Self Care | Payer: Medicare Other | Source: Ambulatory Visit | Attending: Cardiology | Admitting: Cardiology

## 2021-06-11 DIAGNOSIS — I4891 Unspecified atrial fibrillation: Principal | ICD-10-CM

## 2021-06-11 DIAGNOSIS — F419 Anxiety disorder, unspecified: Secondary | ICD-10-CM | POA: Diagnosis not present

## 2021-06-11 DIAGNOSIS — I480 Paroxysmal atrial fibrillation: Secondary | ICD-10-CM | POA: Diagnosis not present

## 2021-06-11 DIAGNOSIS — M47814 Spondylosis without myelopathy or radiculopathy, thoracic region: Secondary | ICD-10-CM | POA: Diagnosis not present

## 2021-06-11 DIAGNOSIS — M47812 Spondylosis without myelopathy or radiculopathy, cervical region: Secondary | ICD-10-CM | POA: Diagnosis not present

## 2021-06-11 DIAGNOSIS — E785 Hyperlipidemia, unspecified: Secondary | ICD-10-CM | POA: Diagnosis not present

## 2021-06-11 DIAGNOSIS — Z888 Allergy status to other drugs, medicaments and biological substances status: Secondary | ICD-10-CM

## 2021-06-11 DIAGNOSIS — Z981 Arthrodesis status: Secondary | ICD-10-CM

## 2021-06-11 DIAGNOSIS — D6869 Other thrombophilia: Secondary | ICD-10-CM | POA: Diagnosis present

## 2021-06-11 DIAGNOSIS — I4819 Other persistent atrial fibrillation: Secondary | ICD-10-CM | POA: Diagnosis not present

## 2021-06-11 DIAGNOSIS — I493 Ventricular premature depolarization: Secondary | ICD-10-CM | POA: Diagnosis not present

## 2021-06-11 DIAGNOSIS — I088 Other rheumatic multiple valve diseases: Secondary | ICD-10-CM | POA: Diagnosis not present

## 2021-06-11 DIAGNOSIS — I3139 Other pericardial effusion (noninflammatory): Secondary | ICD-10-CM | POA: Diagnosis not present

## 2021-06-11 DIAGNOSIS — Z7901 Long term (current) use of anticoagulants: Secondary | ICD-10-CM | POA: Diagnosis not present

## 2021-06-11 DIAGNOSIS — Z791 Long term (current) use of non-steroidal anti-inflammatories (NSAID): Secondary | ICD-10-CM | POA: Diagnosis not present

## 2021-06-11 DIAGNOSIS — I700 Atherosclerosis of aorta: Secondary | ICD-10-CM | POA: Diagnosis not present

## 2021-06-11 DIAGNOSIS — Z79899 Other long term (current) drug therapy: Secondary | ICD-10-CM | POA: Diagnosis not present

## 2021-06-11 DIAGNOSIS — G4700 Insomnia, unspecified: Secondary | ICD-10-CM | POA: Diagnosis not present

## 2021-06-11 DIAGNOSIS — Z8249 Family history of ischemic heart disease and other diseases of the circulatory system: Secondary | ICD-10-CM | POA: Diagnosis not present

## 2021-06-11 DIAGNOSIS — Z885 Allergy status to narcotic agent status: Secondary | ICD-10-CM | POA: Diagnosis not present

## 2021-06-11 DIAGNOSIS — Z006 Encounter for examination for normal comparison and control in clinical research program: Secondary | ICD-10-CM

## 2021-06-11 DIAGNOSIS — M1990 Unspecified osteoarthritis, unspecified site: Secondary | ICD-10-CM

## 2021-06-11 DIAGNOSIS — Z95818 Presence of other cardiac implants and grafts: Secondary | ICD-10-CM

## 2021-06-11 HISTORY — PX: LEFT ATRIAL APPENDAGE OCCLUSION: EP1229

## 2021-06-11 HISTORY — PX: TEE WITHOUT CARDIOVERSION: SHX5443

## 2021-06-11 HISTORY — DX: Presence of other cardiac implants and grafts: Z95.818

## 2021-06-11 LAB — TYPE AND SCREEN
ABO/RH(D): O POS
Antibody Screen: NEGATIVE

## 2021-06-11 LAB — SURGICAL PCR SCREEN
MRSA, PCR: NEGATIVE
Staphylococcus aureus: NEGATIVE

## 2021-06-11 LAB — ABO/RH: ABO/RH(D): O POS

## 2021-06-11 SURGERY — LEFT ATRIAL APPENDAGE OCCLUSION
Anesthesia: General

## 2021-06-11 MED ORDER — RIVAROXABAN 20 MG PO TABS
20.0000 mg | ORAL_TABLET | Freq: Every day | ORAL | Status: DC
  Administered 2021-06-11: 20 mg via ORAL
  Filled 2021-06-11: qty 1

## 2021-06-11 MED ORDER — CEFAZOLIN SODIUM-DEXTROSE 2-4 GM/100ML-% IV SOLN
2.0000 g | INTRAVENOUS | Status: AC
  Administered 2021-06-11: 2 g via INTRAVENOUS
  Filled 2021-06-11: qty 100

## 2021-06-11 MED ORDER — SODIUM CHLORIDE 0.9 % IV SOLN: INTRAVENOUS | Status: DC

## 2021-06-11 MED ORDER — ACETAMINOPHEN 325 MG PO TABS
650.0000 mg | ORAL_TABLET | ORAL | Status: DC | PRN
  Administered 2021-06-11 (×2): 650 mg via ORAL
  Filled 2021-06-11 (×2): qty 2

## 2021-06-11 MED ORDER — HEPARIN (PORCINE) IN NACL 1000-0.9 UT/500ML-% IV SOLN
INTRAVENOUS | Status: DC | PRN
  Administered 2021-06-11: 500 mL

## 2021-06-11 MED ORDER — LACTATED RINGERS IV SOLN: INTRAVENOUS | Status: DC | PRN

## 2021-06-11 MED ORDER — SODIUM CHLORIDE 0.9% FLUSH: 3.0000 mL | INTRAVENOUS | Status: DC | PRN

## 2021-06-11 MED ORDER — FENTANYL CITRATE (PF) 100 MCG/2ML IJ SOLN
INTRAMUSCULAR | Status: DC | PRN
  Administered 2021-06-11: 100 ug via INTRAVENOUS
  Administered 2021-06-11: 25 ug via INTRAVENOUS

## 2021-06-11 MED ORDER — ONDANSETRON HCL 4 MG/2ML IJ SOLN
INTRAMUSCULAR | Status: DC | PRN
  Administered 2021-06-11: 4 mg via INTRAVENOUS

## 2021-06-11 MED ORDER — DEXAMETHASONE SODIUM PHOSPHATE 10 MG/ML IJ SOLN
INTRAMUSCULAR | Status: DC | PRN
  Administered 2021-06-11: 5 mg via INTRAVENOUS

## 2021-06-11 MED ORDER — SODIUM CHLORIDE 0.9% FLUSH
3.0000 mL | Freq: Two times a day (BID) | INTRAVENOUS | Status: DC
  Administered 2021-06-11 – 2021-06-12 (×2): 3 mL via INTRAVENOUS

## 2021-06-11 MED ORDER — CHLORHEXIDINE GLUCONATE 0.12 % MT SOLN
OROMUCOSAL | Status: AC
  Filled 2021-06-11: qty 15

## 2021-06-11 MED ORDER — HEPARIN (PORCINE) IN NACL 2000-0.9 UNIT/L-% IV SOLN
INTRAVENOUS | Status: DC | PRN
  Administered 2021-06-11: 1000 mL

## 2021-06-11 MED ORDER — PROPOFOL 500 MG/50ML IV EMUL
INTRAVENOUS | Status: DC | PRN
  Administered 2021-06-11: 150 ug/kg/min via INTRAVENOUS

## 2021-06-11 MED ORDER — METOPROLOL TARTRATE 5 MG/5ML IV SOLN
INTRAVENOUS | Status: AC
  Filled 2021-06-11: qty 5

## 2021-06-11 MED ORDER — PROPOFOL 10 MG/ML IV BOLUS
INTRAVENOUS | Status: DC | PRN
  Administered 2021-06-11: 20 mg via INTRAVENOUS
  Administered 2021-06-11: 30 mg via INTRAVENOUS
  Administered 2021-06-11: 20 mg via INTRAVENOUS
  Administered 2021-06-11: 100 mg via INTRAVENOUS
  Administered 2021-06-11: 20 mg via INTRAVENOUS

## 2021-06-11 MED ORDER — ROCURONIUM BROMIDE 10 MG/ML (PF) SYRINGE
PREFILLED_SYRINGE | INTRAVENOUS | Status: DC | PRN
  Administered 2021-06-11: 20 mg via INTRAVENOUS
  Administered 2021-06-11: 50 mg via INTRAVENOUS

## 2021-06-11 MED ORDER — ZOLPIDEM TARTRATE 5 MG PO TABS
10.0000 mg | ORAL_TABLET | Freq: Every day | ORAL | Status: DC
  Administered 2021-06-11: 10 mg via ORAL
  Filled 2021-06-11: qty 2

## 2021-06-11 MED ORDER — ONDANSETRON HCL 4 MG/2ML IJ SOLN: 4.0000 mg | Freq: Four times a day (QID) | INTRAMUSCULAR | Status: DC | PRN

## 2021-06-11 MED ORDER — HEPARIN (PORCINE) IN NACL 1000-0.9 UT/500ML-% IV SOLN
INTRAVENOUS | Status: AC
  Filled 2021-06-11: qty 500

## 2021-06-11 MED ORDER — ROSUVASTATIN CALCIUM 5 MG PO TABS
10.0000 mg | ORAL_TABLET | Freq: Every day | ORAL | Status: DC
  Administered 2021-06-11 – 2021-06-12 (×2): 10 mg via ORAL
  Filled 2021-06-11 (×2): qty 2

## 2021-06-11 MED ORDER — SODIUM CHLORIDE 0.9 % IV SOLN: 250.0000 mL | INTRAVENOUS | Status: DC | PRN

## 2021-06-11 MED ORDER — CHLORHEXIDINE GLUCONATE 0.12 % MT SOLN
15.0000 mL | Freq: Once | OROMUCOSAL | Status: AC
  Administered 2021-06-11: 15 mL via OROMUCOSAL
  Filled 2021-06-11: qty 15

## 2021-06-11 MED ORDER — HEPARIN SODIUM (PORCINE) 1000 UNIT/ML IJ SOLN
INTRAMUSCULAR | Status: DC | PRN
  Administered 2021-06-11: 11000 [IU] via INTRAVENOUS

## 2021-06-11 MED ORDER — MIDAZOLAM HCL 5 MG/5ML IJ SOLN
INTRAMUSCULAR | Status: DC | PRN
  Administered 2021-06-11 (×2): 1 mg via INTRAVENOUS

## 2021-06-11 MED ORDER — IOHEXOL 350 MG/ML SOLN
INTRAVENOUS | Status: DC | PRN
  Administered 2021-06-11: 30 mL via INTRA_ARTERIAL

## 2021-06-11 MED ORDER — PROTAMINE SULFATE 10 MG/ML IV SOLN
INTRAVENOUS | Status: DC | PRN
  Administered 2021-06-11: 35 mg via INTRAVENOUS

## 2021-06-11 MED ORDER — METOPROLOL TARTRATE 5 MG/5ML IV SOLN
INTRAVENOUS | Status: DC | PRN
Start: 2021-06-11 — End: 2021-06-11
  Administered 2021-06-11: 2 mg via INTRAVENOUS

## 2021-06-11 MED ORDER — HEPARIN (PORCINE) IN NACL 2000-0.9 UNIT/L-% IV SOLN
INTRAVENOUS | Status: AC
  Filled 2021-06-11: qty 1000

## 2021-06-11 MED ORDER — LIDOCAINE 2% (20 MG/ML) 5 ML SYRINGE
INTRAMUSCULAR | Status: DC | PRN
  Administered 2021-06-11: 60 mg via INTRAVENOUS

## 2021-06-11 MED ORDER — SUGAMMADEX SODIUM 200 MG/2ML IV SOLN
INTRAVENOUS | Status: DC | PRN
  Administered 2021-06-11: 200 mg via INTRAVENOUS

## 2021-06-11 MED ORDER — ESMOLOL HCL 100 MG/10ML IV SOLN
INTRAVENOUS | Status: DC | PRN
  Administered 2021-06-11: 30 mg via INTRAVENOUS
  Administered 2021-06-11: 50 mg via INTRAVENOUS
  Administered 2021-06-11: 20 mg via INTRAVENOUS

## 2021-06-11 SURGICAL SUPPLY — 18 items
CATH 5FR PIGTAIL DIAGNOSTIC (CATHETERS) ×1 IMPLANT
CLOSURE PERCLOSE PROSTYLE (VASCULAR PRODUCTS) ×2 IMPLANT
DEVICE WATCHMAN FLX PROC (KITS) IMPLANT
DILATOR VESSEL 38 20CM 11FR (INTRODUCER) ×1 IMPLANT
KIT HEART LEFT (KITS) ×3 IMPLANT
KIT SHEA VERSACROSS LAAC CONNE (KITS) ×1 IMPLANT
PACK CARDIAC CATHETERIZATION (CUSTOM PROCEDURE TRAY) ×3 IMPLANT
PAD DEFIB RADIO PHYSIO CONN (PAD) ×3 IMPLANT
SHEATH PERFORMER 16FR 30 (SHEATH) ×1 IMPLANT
SHEATH PINNACLE 8F 10CM (SHEATH) ×1 IMPLANT
SHEATH PROBE COVER 6X72 (BAG) ×3 IMPLANT
SYS WATCHMAN FXD SGL (SHEATH) ×2
SYSTEM WATCHMAN FXD SGL (SHEATH) IMPLANT
TRANSDUCER W/STOPCOCK (MISCELLANEOUS) ×3 IMPLANT
TUBING CIL FLEX 10 FLL-RA (TUBING) ×3 IMPLANT
WATCHMAN FLX 27 (Prosthesis & Implant Heart) ×1 IMPLANT
WATCHMAN FLX PROCEDURE DEVICE (KITS) ×2 IMPLANT
WATCHMAN PROCED TRUSEAL ACCESS (SHEATH) ×1 IMPLANT

## 2021-06-11 NOTE — Anesthesia Procedure Notes (Signed)
Procedure Name: Intubation ?Date/Time: 06/11/2021 11:13 AM ?Performed by: Janene Harvey, CRNA ?Pre-anesthesia Checklist: Patient identified, Emergency Drugs available, Suction available and Patient being monitored ?Patient Re-evaluated:Patient Re-evaluated prior to induction ?Oxygen Delivery Method: Circle system utilized ?Preoxygenation: Pre-oxygenation with 100% oxygen ?Induction Type: IV induction ?Ventilation: Mask ventilation without difficulty ?Laryngoscope Size: Mac and 4 ?Grade View: Grade III ?Tube type: Oral ?Tube size: 7.0 mm ?Number of attempts: 1 ?Airway Equipment and Method: Stylet and Oral airway ?Placement Confirmation: ETT inserted through vocal cords under direct vision, positive ETCO2 and breath sounds checked- equal and bilateral ?Secured at: 22 cm ?Tube secured with: Tape ?Dental Injury: Teeth and Oropharynx as per pre-operative assessment  ? ? ? ? ?

## 2021-06-11 NOTE — Progress Notes (Signed)
LOCATION: right RADIAL a-line ? ?DRESSING APPLIED: a-line removed, manual pressure held for approximately 10 minutes, gauze with tegaderm placed ? ?SITE UPON ARRIVAL: LEVEL 0 ?  ?SITE AFTER BAND REMOVAL: LEVEL 0 ? ?CIRCULATION SENSATION AND MOVEMENT: +2 radial pulse, +movement ? ?COMMENTS: slight bruising noted around insertion site ?

## 2021-06-11 NOTE — Transfer of Care (Signed)
Immediate Anesthesia Transfer of Care Note ? ?Patient: JOLANDA MCCANN ? ?Procedure(s) Performed: LEFT ATRIAL APPENDAGE OCCLUSION ?TRANSESOPHAGEAL ECHOCARDIOGRAM (TEE) ? ?Patient Location: PACU and Cath Lab ? ?Anesthesia Type:General ? ?Level of Consciousness: drowsy, patient cooperative and responds to stimulation ? ?Airway & Oxygen Therapy: Patient Spontanous Breathing ? ?Post-op Assessment: Report given to RN and Post -op Vital signs reviewed and stable ? ?Post vital signs: Reviewed and stable ? ?Last Vitals:  ?Vitals Value Taken Time  ?BP 161/61 06/11/21 1240  ?Temp    ?Pulse 45 06/11/21 1241  ?Resp 14 06/11/21 1241  ?SpO2 98 % 06/11/21 1241  ?Vitals shown include unvalidated device data. ? ?Last Pain:  ?Vitals:  ? 06/11/21 0918  ?TempSrc:   ?PainSc: 0-No pain  ?   ? ?  ? ?Complications: There were no known notable events for this encounter. ?

## 2021-06-11 NOTE — Interval H&P Note (Signed)
History and Physical Interval Note: ? ?06/11/2021 ?10:28 AM ? ?Lorraine [name]  has presented today for surgery, with the diagnosis of atrial fibrillation.  The various methods of treatment have been discussed with the patient and family. After consideration of risks, benefits and other options for treatment, the patient has consented to  Procedure(s): ?LEFT ATRIAL APPENDAGE OCCLUSION (N/A) ?TRANSESOPHAGEAL ECHOCARDIOGRAM (TEE) (N/A) as a surgical intervention.  The patient's history has been reviewed, patient examined, no change in status, stable for surgery.  I have reviewed the patient's chart and labs.  Questions were answered to the patient's satisfaction.   ? ?CHADSVASc of 3 for age, gender and aortic atherosclerosis. ? ?Richele Strand T Jeryn Bertoni ? ? ?

## 2021-06-11 NOTE — Progress Notes (Signed)
Foley removed at 2000. Pt tolerated well. ?

## 2021-06-11 NOTE — Anesthesia Postprocedure Evaluation (Signed)
Anesthesia Post Note ? ?Patient: Lorraine [name] ? ?Procedure(s) Performed: LEFT ATRIAL APPENDAGE OCCLUSION ?TRANSESOPHAGEAL ECHOCARDIOGRAM (TEE) ? ?  ? ?Patient location during evaluation: Cath Lab ?Anesthesia Type: General ?Level of consciousness: awake and alert ?Pain management: pain level controlled ?Vital Signs Assessment: post-procedure vital signs reviewed and stable ?Respiratory status: spontaneous breathing, nonlabored ventilation and respiratory function stable ?Cardiovascular status: blood pressure returned to baseline and stable ?Postop Assessment: no apparent nausea or vomiting ?Anesthetic complications: no ? ? ?There were no known notable events for this encounter. ? ?Last Vitals:  ?Vitals:  ? 06/11/21 1335 06/11/21 1400  ?BP: (!) 177/63 (!) 166/52  ?Pulse: (!) 44 (!) 48  ?Resp: 15 15  ?Temp:    ?SpO2: 100% 100%  ?  ?Last Pain:  ?Vitals:  ? 06/11/21 1307  ?TempSrc: Temporal  ?PainSc: 0-No pain  ? ? ?  ?  ?  ?  ?  ?  ? ?Tequilla Cousineau,W. EDMOND ? ? ? ? ?

## 2021-06-11 NOTE — Anesthesia Preprocedure Evaluation (Addendum)
Anesthesia Evaluation  ?Patient identified by MRN, date of birth, ID band ?Patient awake ? ? ? ?Reviewed: ?Allergy & Precautions, H&P , NPO status , Patient's Chart, lab work & pertinent test results ? ?History of Anesthesia Complications ?(+) PONV and history of anesthetic complications ? ?Airway ?Mallampati: II ? ?TM Distance: >3 FB ?Neck ROM: Full ? ? ? Dental ?no notable dental hx. ?(+) Teeth Intact, Dental Advisory Given ?  ?Pulmonary ?neg pulmonary ROS,  ?  ?Pulmonary exam normal ?breath sounds clear to auscultation ? ? ? ? ? ? Cardiovascular ?negative cardio ROS ? ?+ dysrhythmias Atrial Fibrillation  ?Rhythm:Regular Rate:Normal ? ? ?  ?Neuro/Psych ?Anxiety negative neurological ROS ? negative psych ROS  ? GI/Hepatic ?negative GI ROS, Neg liver ROS,   ?Endo/Other  ?negative endocrine ROS ? Renal/GU ?negative Renal ROS  ?negative genitourinary ?  ?Musculoskeletal ? ?(+) Arthritis , Osteoarthritis,   ? Abdominal ?  ?Peds ? Hematology ?negative hematology ROS ?(+)   ?Anesthesia Other Findings ? ? Reproductive/Obstetrics ?negative OB ROS ? ?  ? ? ? ? ? ? ? ? ? ? ? ? ? ?  ?  ? ? ? ? ? ? ? ?Anesthesia Physical ?Anesthesia Plan ? ?ASA: 3 ? ?Anesthesia Plan: General  ? ?Post-op Pain Management: Tylenol PO (pre-op)*  ? ?Induction: Intravenous ? ?PONV Risk Score and Plan: 4 or greater and Ondansetron, Dexamethasone, Propofol infusion and TIVA ? ?Airway Management Planned: Oral ETT ? ?Additional Equipment: Arterial line ? ?Intra-op Plan:  ? ?Post-operative Plan: Extubation in OR ? ?Informed Consent: I have reviewed the patients History and Physical, chart, labs and discussed the procedure including the risks, benefits and alternatives for the proposed anesthesia with the patient or authorized representative who has indicated his/her understanding and acceptance.  ? ? ? ?Dental advisory given ? ?Plan Discussed with: CRNA ? ?Anesthesia Plan Comments:   ? ? ? ? ? ? ?Anesthesia Quick  Evaluation ? ?

## 2021-06-11 NOTE — Progress Notes (Signed)
Purewick placed, tolerated well, safety maintained ° °

## 2021-06-11 NOTE — Anesthesia Procedure Notes (Signed)
Arterial Line Insertion ?Start/End3/30/2023 10:05 AM, 06/11/2021 10:15 AM ?Performed by: Dorann Lodge, CRNA, CRNA ? Patient location: Pre-op. ?Preanesthetic checklist: patient identified, IV checked, site marked, risks and benefits discussed, surgical consent, monitors and equipment checked, pre-op evaluation, timeout performed and anesthesia consent ?Lidocaine 1% used for infiltration ?Right, radial was placed ?Catheter size: 20 G ?Hand hygiene performed  and maximum sterile barriers used  ? ?Attempts: 1 ?Procedure performed without using ultrasound guided technique. ?Following insertion, dressing applied and Biopatch. ?Post procedure assessment: normal and unchanged ? ?Patient tolerated the procedure well with no immediate complications. ? ? ?

## 2021-06-11 NOTE — Progress Notes (Addendum)
Pt with c/o bladder pressure/soreness, In/out cath completed per order, approximately 600cc of clear, yellow urine noted for return, tolerated well, sterility maintained, purewick replaced after in/out cath completed ?

## 2021-06-11 NOTE — Progress Notes (Addendum)
Spoke with Dr. Quentin Ore, pt continues with c/o bladder pressure, order obtained for Foley catheter, 65F catheter placed, approximately 750cc of clear, yellow urine noted for return, tolerated well, sterility and safety maintained, Stat-lock in place, Foley to be removed when BR completed ?

## 2021-06-11 NOTE — Progress Notes (Signed)
?  [facility]   ? ?Patient doing well s/p Watchman implant. Groin site is stable. Arterial line to be discontinued and transferred to 2c. Plan for early ambulation after bedrest completed and hopeful discharge tomorrow. Plan to restart home Xarelto monotherapy.  ? ? ?Kathyrn Drown NP-C ?[facility] Team  ?Pager: [phone] ?Phone: [phone]  ?

## 2021-06-12 ENCOUNTER — Encounter (HOSPITAL_COMMUNITY): Payer: Self-pay | Admitting: Cardiology

## 2021-06-12 DIAGNOSIS — I4819 Other persistent atrial fibrillation: Secondary | ICD-10-CM | POA: Diagnosis not present

## 2021-06-12 DIAGNOSIS — Z885 Allergy status to narcotic agent status: Secondary | ICD-10-CM | POA: Diagnosis not present

## 2021-06-12 DIAGNOSIS — Z888 Allergy status to other drugs, medicaments and biological substances status: Secondary | ICD-10-CM | POA: Diagnosis not present

## 2021-06-12 DIAGNOSIS — Z95818 Presence of other cardiac implants and grafts: Secondary | ICD-10-CM

## 2021-06-12 DIAGNOSIS — E785 Hyperlipidemia, unspecified: Secondary | ICD-10-CM | POA: Diagnosis not present

## 2021-06-12 LAB — BASIC METABOLIC PANEL
Anion gap: 7 (ref 5–15)
BUN: 28 mg/dL — ABNORMAL HIGH (ref 8–23)
CO2: 23 mmol/L (ref 22–32)
Calcium: 9.1 mg/dL (ref 8.9–10.3)
Chloride: 110 mmol/L (ref 98–111)
Creatinine, Ser: 0.79 mg/dL (ref 0.44–1.00)
GFR, Estimated: >60 mL/min (ref >60)
Glucose, Bld: 110 mg/dL — ABNORMAL HIGH (ref 70–99)
Potassium: 4.1 mmol/L (ref 3.5–5.1)
Sodium: 140 mmol/L (ref 135–145)

## 2021-06-12 LAB — POCT ACTIVATED CLOTTING TIME: Activated Clotting Time: 389 seconds

## 2021-06-12 MED ORDER — AMOXICILLIN 500 MG PO CAPS: 2000.0000 mg | ORAL_CAPSULE | ORAL | 0 refills | Status: DC

## 2021-06-12 NOTE — Discharge Summary (Signed)
?[facility]   ? ?Patient ID: Lorraine [name],  ?MRN: [id number], DOB/AGE: January 02, 1951 71 y.o. ? ?Admit date: 06/11/2021 ?Discharge date: 06/12/2021 ? ?Primary Care Physician: [name], MD  ?Primary Cardiologist: [name], MD  ?Electrophysiologist: Vickie Epley, MD ? ?Procedures This Admission:  ? ?CONCLUSIONS:  ?1.Successful implantation of a WATCHMAN left atrial appendage occlusive device    ?2. TEE demonstrating no LAA thrombus ?3. No early apparent complications.  ?  ?Post Implant Anticoagulation Strategy: ?Continue rivaroxaban 65m daily. If 45-day TEE criteria are met, stop rivaroxaban and start plavix 742mPO daily to complete 6 months of post implant therapy.  ? ?Brief HPI: ?Lorraine KIN[age]HITAs a [age].o. female with a history of persistent atrial fibrillation who underwent successful PVI ablation with Dr. LaQuentin Ore/26/23. She tolerated the procedure well with plans to undergo LAAO closure with Watchman as NOAC therapy has been very cost prohibitive.  ? ?CT imaging showed anatomy suitable for Watchman implant therefore her procedure was scheduled for 06/11/21.  ? ?Other hx includes AR and HLD.  ? ?Hospital Course:  ?The patient was admitted and underwent LAAO placement with Watchman FLX 2743mShe was monitored on telemetry overnight which demonstrated NSR. Groin site has been without complication on the day of discharge. She was seen and examined by Dr. LamQuentin Ored considered stable for discharge. Procedure site care and restrictions were reviewed with the patient. Follow up appointments with [facility] APP have been made for approximately one month after implant. A repeat TEE will be performed at approximately 45 days post procedure to ensure proper seal of the device and to ensure no device thrombus. SBE discussed and Amoxicillin will be RX'ed today given dental cleaning planned for next month. This was reviewed.  ? ?Anticoagulation plan: Continue rivaroxaban 36m68mily.  If 45-day TEE criteria are met, stop rivaroxaban and start plavix 75mg34mdaily to complete 6 months of post implant therapy.  ?  ?Physical Exam: ?Vitals:  ? 06/11/21 2000 06/11/21 2337 06/12/21 0416 06/12/21 0727  ?BP: (!) 148/89 111/65 133/65 (!) 145/73  ?Pulse: 71 65 60 67  ?Resp: '17 19 16 17  ' ?Temp: 98 ?F (36.7 ?C) 97.6 ?F (36.4 ?C) 98.3 ?F (36.8 ?C) 98.3 ?F (36.8 ?C)  ?TempSrc: Oral Oral Oral Oral  ?SpO2: 96% 95% 98% 95%  ?Weight:      ?Height:      ? ?General: Well developed, well nourished, NAD ?Lungs:Clear to ausculation bilaterally. No wheezes, rales, or rhonchi. Breathing is unlabored. ?Cardiovascular: RRR with S1 S2. No murmurs ?Extremities: No edema. ?Neuro: Alert and oriented. No focal deficits. No facial asymmetry. MAE spontaneously. ?Psych: Responds to questions appropriately with normal affect.   ? ?Labs: ?  ?Lab Results  ?Component Value Date  ? WBC 6.3 06/02/2021  ? HGB 14.3 06/02/2021  ? HCT 43.5 06/02/2021  ? MCV 84.3 06/02/2021  ? PLT 242.0 06/02/2021  ?  ?Recent Labs  ?Lab 06/12/21 ?0045  ?NA 140  ?K 4.1  ?CL 110  ?CO2 23  ?BUN 28*  ?CREATININE 0.79  ?CALCIUM 9.1  ?GLUCOSE 110*  ? ? ?Discharge Medications:  ?Allergies as of 06/12/2021   ? ?   Reactions  ? Amitriptyline   ? Bladder retention  ? Codeine Nausea And Vomiting  ? Nortriptyline   ? Bladder retention  ? ?  ? ?  ?Medication List  ?  ? ?TAKE these medications   ? ?ALPRAZolam 0.5 MG tablet ?Commonly known as: XANAXDuanne Moron  Take 1 tablet (0.5 mg total) by mouth 2 (two) times daily as needed for anxiety. ?  ?amoxicillin 500 MG capsule ?Commonly known as: AMOXIL ?Take 4 capsules (2,000 mg total) by mouth as directed. Take 4 tablets 1 hour prior to dental work, including cleanings. ?  ?cyanocobalamin 1000 MCG/ML injection ?Commonly known as: (VITAMIN B-12) ?Inject 1 mL (1,000 mcg total) into the muscle once a week. ?  ?gabapentin 300 MG capsule ?Commonly known as: NEURONTIN ?Take 300 mg by mouth at bedtime as needed (nerve pain). ?  ?meloxicam 15 MG  tablet ?Commonly known as: MOBIC ?Take 1 tablet (15 mg total) by mouth daily. ?What changed: how much to take ?  ?metoprolol tartrate 25 MG tablet ?Commonly known as: LOPRESSOR ?TAKE 1/2-1 TABLET BY MOUTH EVERY 6-8HOURS FOR BREAKTHROUGH AFIB FOR HEART RATES OVER 100 ?  ?polyvinyl alcohol 1.4 % ophthalmic solution ?Commonly known as: LIQUIFILM TEARS ?Place 1 drop into both eyes as needed for dry eyes. ?  ?rivaroxaban 20 MG Tabs tablet ?Commonly known as: Xarelto ?Take 1 tablet (20 mg total) by mouth daily with supper. ?  ?rosuvastatin 10 MG tablet ?Commonly known as: Crestor ?Take 1 tablet (10 mg total) by mouth daily. ?  ?valACYclovir 1000 MG tablet ?Commonly known as: VALTREX ?Take 1 tablet (1,000 mg total) by mouth daily. ?What changed:  ?when to take this ?reasons to take this ?  ?zolpidem 10 MG tablet ?Commonly known as: AMBIEN ?Take 1 tablet (10 mg total) by mouth at bedtime. ?  ? ?  ? ? ?Disposition: Home  ?Discharge Instructions   ? ? Call MD for:  difficulty breathing, headache or visual disturbances   Complete by: As directed ?  ? Call MD for:  extreme fatigue   Complete by: As directed ?  ? Call MD for:  hives   Complete by: As directed ?  ? Call MD for:  persistant dizziness or light-headedness   Complete by: As directed ?  ? Call MD for:  persistant nausea and vomiting   Complete by: As directed ?  ? Call MD for:  redness, tenderness, or signs of infection (pain, swelling, redness, odor or green/yellow discharge around incision site)   Complete by: As directed ?  ? Call MD for:  severe uncontrolled pain   Complete by: As directed ?  ? Call MD for:  temperature >100.4   Complete by: As directed ?  ? Diet - low sodium heart healthy   Complete by: As directed ?  ? Discharge instructions   Complete by: As directed ?  ? WATCHMAN? Procedure, Care After ? ?Procedure MD: Dr. Quentin Ore ?Watchman Clinical Coordinator: Lenice Llamas, RN ? ?This sheet gives you information about how to care for yourself after your  procedure. Your health care provider may also give you more specific instructions. If you have problems or questions, contact your health care provider. ? ?What can I expect after the procedure? ?After the procedure, it is common to have: ?Bruising around your puncture site. ?Tenderness around your puncture site. ?Tiredness (fatigue). ? ?Medication instructions ?It is very important to continue to take your blood thinner as directed by your doctor after the Watchman procedure. Call your procedure doctor's office with question or concerns. ?If you are on Coumadin (warfarin), you will have your INR checked the week after your procedure, with a goal INR of 2.0 - 3.0. ?Please follow your medication instructions on your discharge summary. Only take the medications listed on your discharge paperwork. ? ?Follow  up ?You will be seen in 1 month after your procedure in the Atrial Fibrillation Clinic ?You will have another TEE (Transesophageal Echocardiogram) approximately 6 weeks after your procedure mark to check your device ?You will follow up the MD/APP who performed your procedure 6 months after your procedure ?The Watchman Clinical Coordinator will check in with you from time to time, including 1 and 2 years after your procedure.  ? ? ?Follow these instructions at home: ?Puncture site care  ?Follow instructions from your health care provider about how to take care of your puncture site. Make sure you: ?If present, leave stitches (sutures), skin glue, or adhesive strips in place.  ?If a large square bandage is present, this may be removed 24 hours after surgery.  ?Check your puncture site every day for signs of infection. Check for: ?Redness, swelling, or pain. ?Fluid or blood. If your puncture site starts to bleed, lie down on your back, apply firm pressure to the area, and contact your health care provider. ?Warmth. ?Pus or a bad smell. ?Driving ?Do not drive yourself home if you received sedation ?Do not drive for at  least 4 days after your procedure or however long your health care provider recommends. (Do not resume driving if you have previously been instructed not to drive for other health reasons.) ?Do not spend gre

## 2021-06-12 NOTE — TOC Progression Note (Signed)
Transition of Care (TOC) - Progression Note  ? ? ?Patient Details  ?Name: ALEIGHA GILANI ?MRN: [id number] ?Date of Birth: 04-Jun-1950 ? ?Transition of Care (TOC) CM/SW Contact  ?Angelita Ingles, RN ?Phone Number:[phone] ? ?06/12/2021, 10:08 AM ? ?Clinical Narrative:    ? ?Transition of Care (TOC) Screening Note ? ? ?Patient Details  ?Name: MARIAJOSE MOW ?Date of Birth: [birth year] ? ? ?Transition of Care (TOC) CM/SW Contact:    ?Angelita Ingles, RN ?Phone Number: ?06/12/2021, 10:08 AM ? ? ? ?Transition of Care Department [facility]) has reviewed patient and no TOC needs have been identified at this time. We will continue to monitor patient advancement through interdisciplinary progression rounds. If new patient transition needs arise, please place a TOC consult. ? ? ? ? ?  ?  ? ?Expected Discharge Plan and Services ?  ?  ?  ?  ?  ?Expected Discharge Date: 06/12/21               ?  ?  ?  ?  ?  ?  ?  ?  ?  ?  ? ? ?Social Determinants of Health (SDOH) Interventions ?  ? ?Readmission Risk Interventions ?   ? View : No data to display.  ?  ?  ?  ? ? ?

## 2021-06-14 ENCOUNTER — Encounter: Payer: Self-pay | Admitting: Family Medicine

## 2021-06-15 ENCOUNTER — Telehealth: Payer: Self-pay

## 2021-06-15 MED ORDER — ZOLPIDEM TARTRATE 10 MG PO TABS: 10.0000 mg | ORAL_TABLET | Freq: Every day | ORAL | 3 refills | Status: DC

## 2021-06-15 NOTE — Addendum Note (Signed)
Addended by: [name] on: 06/15/2021 04:32 PM ? ? Modules accepted: Orders ? ?

## 2021-06-15 NOTE — Telephone Encounter (Signed)
?  [facility]   ?Watchman Team ? ?Contacted the patient regarding discharge from [facility] on 06/12/2021 ? ?The patient understands to follow up with Kathyrn Drown, NP on 07/08/2021 in preparation for TEE on 07/31/2021. ? ?The patient understands discharge instructions? Yes ? ?The patient understands medications and regimen? Yes  ? ?The patient reports groin sites look healthy with so S/S of bleeding or infection. ? ?The patient understands to call with any questions or concerns prior to scheduled visit.  ? ?

## 2021-06-18 ENCOUNTER — Ambulatory Visit: Payer: Medicare Other | Admitting: Internal Medicine

## 2021-06-29 ENCOUNTER — Ambulatory Visit: Payer: Medicare Other | Admitting: Internal Medicine

## 2021-06-29 DIAGNOSIS — Z20822 Contact with and (suspected) exposure to covid-19: Secondary | ICD-10-CM | POA: Diagnosis not present

## 2021-07-01 ENCOUNTER — Encounter: Payer: Self-pay | Admitting: Family Medicine

## 2021-07-07 NOTE — Progress Notes (Signed)
?[facility]   ?                                  ?Cardiology Office Note:   ? ?Date:  07/08/2021  ? ?ID:  Lorraine [name], DOB 12/15/1950, MRN [id number] ? ?PCP:  [name], MD  ?[facility] Cardiologist:  [name], MD  ?[facility] Electrophysiologist:  Vickie Epley, MD  ? ?Referring MD: [name], MD  ? ?Chief Complaint  ?Patient presents with  ? Follow-up  ?  S/p Watchman; pre TEE  ? ?History of Present Illness:   ? ?Lorraine [name] is a 71 y.o. female with a hx of persistent atrial fibrillation who underwent successful PVI ablation with Dr. Quentin Ore 04/09/21. She tolerated the procedure well with plans to undergo LAAO closure with Watchman as NOAC therapy has been very cost prohibitive.CT imaging showed anatomy suitable for Watchman implant therefore her procedure was scheduled for 06/11/21. Other past medical hx includes aortic regurgitation and HLD.  ? ?The patient was admitted and underwent successful LAAO placement with Watchman FLX 14m on 06/11/21. Plan for a repeat TEE at approximately 45 days post procedure to ensure proper seal of the device and to ensure no device thrombus. SBE discussed and Amoxicillin RX'ed prior to hospital discharge. Continue rivaroxaban 231mdaily. If 45-day TEE criteria are met, stop rivaroxaban and start Plavix 7525mO daily to complete 6 months of post implant therapy ? ?Today she is here alone and states that she has been doing well since implant. She is tolerating medications without bleeding in stool or urine. Denies chest pain, SOB, LE edema, orthopnea, dizziness, or syncope. She is having intermittent palpitations described as a "thump". She will check her rhythm with her Kardia device which shoes no AF. She will sometimes take a 1/2 metoprolol to help with this or even a xanax.   Understands the need for SBE 12/12/21.  ? ?Past Medical History:  ?Diagnosis Date  ? Anxiety   ? Aortic atherosclerosis (HCCRicardo1/22/2019  ? Noted on  CT Abd/Pelvis  ? AR (aortic regurgitation) 07/18/2017  ? Mild, noted on ECHO  ? Bilateral cataracts   ? Bilateral plantar fasciitis   ? Chronic knee pain   ? Chronic low back pain   ? Chronic lumbar radiculopathy 07/14/2017  ? Mild, L5, noted on electromyography  ? History of palpitations   ? 2014-- low risk exercise tolerence test, no ischemi, PVC's (09-06-2012)  ? HSV-1 (herpes simplex virus 1) infection   ? Cold sore  ? Hx of colonic polyps   ? Hyperlipidemia   ? Insomnia   ? Lactose intolerance   ? Left nephrolithiasis 02/03/2018  ? Nonobstructing, noted on CT Abd/pelvis  ? Morton's metatarsalgia, neuralgia, or neuroma, bilateral   ? Osteoarthritis   ? knees and lumbar, feet  ? Osteopenia   ? Peripheral neuropathy   ? bilateral feet -- burning and stinging  ? PONV (postoperative nausea and vomiting)   ? severe  ? Presence of Watchman left atrial appendage closure device 06/11/2021  ? Watchman 83m46mX with Dr. LambQuentin OrePVC's (premature ventricular contractions)   ? Systolic murmur   ? "Slight"  ? Vaginal cyst   ? Wears glasses   ? ? ?Past Surgical History:  ?Procedure Laterality Date  ? ANTERIOR CERVICAL DECOMP/DISCECTOMY FUSION  01/2014  ? C4 -- C6  ? ATRIAL FIBRILLATION  ABLATION N/A 04/09/2021  ? Procedure: ATRIAL FIBRILLATION ABLATION;  Surgeon: Vickie Epley, MD;  Location: [place] [facility];  Service: Cardiovascular;  Laterality: N/A;  ? CATARACT EXTRACTION W/ INTRAOCULAR LENS  IMPLANT, BILATERAL  2015  ? COLONOSCOPY  05/14/2015  ? CYSTO WITH HYDRODISTENSION N/A 04/14/2018  ? Procedure: CYSTOSCOPY/HYDRODISTENSION WITH INSTILLATION OF PYRIDIUM AND MARCAINE, BLADDER BIOPSY WITH FULGERATION 0.5 TO 2 CM;  Surgeon: [name], MD;  Location[facility];  Service: Urology;  Laterality: N/A;  ? ELBOW DEBRIDEMENT Right 09/10/2010  ? and tendon debridement and radial tunnel release  ? EXCISION MORTON'S NEUROMA Bilateral left 02-20-2009/  right & left 05-08-2009  ? EXCISION VAGINAL CYST  N/A 07/01/2016  ? Procedure: EXCISION VAGINAL CYST;  Surgeon: Tyson Dense, MD;  Location: [facility];  Service: Gynecology;  Laterality: N/A;  ? LEFT ATRIAL APPENDAGE OCCLUSION N/A 06/11/2021  ? Procedure: LEFT ATRIAL APPENDAGE OCCLUSION;  Surgeon: Vickie Epley, MD;  Location: [place] [facility];  Service: Cardiovascular;  Laterality: N/A;  ? PLANTAR FASCIA RELEASE Bilateral left 04-02-2010/  right 04-02-2016  ? TEE WITHOUT CARDIOVERSION N/A 06/11/2021  ? Procedure: TRANSESOPHAGEAL ECHOCARDIOGRAM (TEE);  Surgeon: Vickie Epley, MD;  Location: [facility] [facility];  Service: Cardiovascular;  Laterality: N/A;  ? TONSILLECTOMY AND ADENOIDECTOMY  child  ? TOTAL ABDOMINAL HYSTERECTOMY  1974  ? w/  Left salpingoophorectomy and Partial right salpingoophorectomy  ? ? ?Current Medications: ?Current Meds  ?Medication Sig  ? ALPRAZolam (XANAX) 0.5 MG tablet Take 1 tablet (0.5 mg total) by mouth 2 (two) times daily as needed for anxiety.  ? amoxicillin (AMOXIL) 500 MG capsule Take 4 capsules (2,000 mg total) by mouth as directed. Take 4 tablets 1 hour prior to dental work, including cleanings.  ? cyanocobalamin (,VITAMIN B-12,) 1000 MCG/ML injection Inject 1 mL (1,000 mcg total) into the muscle once a week.  ? gabapentin (NEURONTIN) 300 MG capsule Take 300 mg by mouth at bedtime as needed (nerve pain).  ? meloxicam (MOBIC) 15 MG tablet Take 1 tablet (15 mg total) by mouth daily. (Patient taking differently: Take 7.5 mg by mouth daily.)  ? metoprolol tartrate (LOPRESSOR) 25 MG tablet TAKE 1/2-1 TABLET BY MOUTH EVERY 6-8HOURS FOR BREAKTHROUGH AFIB FOR HEART RATES OVER 100  ? polyvinyl alcohol (LIQUIFILM TEARS) 1.4 % ophthalmic solution Place 1 drop into both eyes as needed for dry eyes.  ? rivaroxaban (XARELTO) 20 MG TABS tablet Take 1 tablet (20 mg total) by mouth daily with supper.  ? rosuvastatin (CRESTOR) 10 MG tablet Take 1 tablet (10 mg total) by mouth daily.  ? valACYclovir (VALTREX) 1000 MG  tablet Take 1 tablet (1,000 mg total) by mouth daily. (Patient taking differently: Take 1,000 mg by mouth daily as needed (Cold sore).)  ? zolpidem (AMBIEN) 10 MG tablet Take 1 tablet (10 mg total) by mouth at bedtime.  ?  ?Allergies:   Amitriptyline, Codeine, and Nortriptyline  ? ?Social History  ? ?Socioeconomic History  ? Marital status: Divorced  ?  Spouse name: Not on file  ? Number of children: 1  ? Years of education: 90  ? Highest education level: Not on file  ?Occupational History  ? 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  Employer: [employer]  ?Tobacco Use  ? Smoking status: Never  ? Smokeless tobacco: Never  ?Vaping Use  ? Vaping Use: Never used  ?Substance and Sexual Activity  ? Alcohol use: Yes  ?  Comment: rare  ?  Drug use: No  ? Sexual activity: Not on file  ?  Comment: Hysterectomy  ?Other Topics Concern  ? Not on file  ?Social History Narrative  ? Lives alone in a one story home.  Has one son.  Works as a Counsellor for [employer].  Education: some college.   ? ?Social Determinants of Health  ? ?Financial Resource Strain: Not on file  ?Food Insecurity: Not on file  ?Transportation Needs: Not on file  ?Physical Activity: Not on file  ?Stress: Not on file  ?Social Connections: Not on file  ?  ? ?Family History: ?The patient's family history includes Cancer in her paternal grandfather; Heart attack in her father; Lung cancer in her mother; Memory loss in her father; Multiple sclerosis in her brother. ? ?ROS:   ?Please see the history of present illness.    ?All other systems reviewed and are negative. ? ?EKGs/Labs/Other Studies Reviewed:   ? ?The following studies were reviewed today: ? ?Watchman implant 06/11/21: ? ?Procedures This Admission:  ?  ?CONCLUSIONS:  ?1.Successful implantation of a WATCHMAN left atrial appendage occlusive device    ?2. TEE demonstrating no LAA thrombus ?3. No early apparent complications.  ?  ?Post Implant Anticoagulation Strategy: ?Continue  rivaroxaban 26m daily. If 45-day TEE criteria are met, stop rivaroxaban and start plavix 750mPO daily to complete 6 months of post implant therapy.  ? ?EKG:  EKG is ordered today.  The ekg ordered today demo

## 2021-07-08 ENCOUNTER — Ambulatory Visit (INDEPENDENT_AMBULATORY_CARE_PROVIDER_SITE_OTHER): Payer: Medicare Other | Admitting: Cardiology

## 2021-07-08 VITALS — BP 130/78 | HR 53 | Ht 65.0 in | Wt 164.0 lb

## 2021-07-08 DIAGNOSIS — I480 Paroxysmal atrial fibrillation: Secondary | ICD-10-CM

## 2021-07-08 DIAGNOSIS — R002 Palpitations: Secondary | ICD-10-CM

## 2021-07-08 DIAGNOSIS — Z95818 Presence of other cardiac implants and grafts: Secondary | ICD-10-CM | POA: Diagnosis not present

## 2021-07-08 NOTE — Patient Instructions (Addendum)
Medication Instructions:  ?Your physician recommends that you continue on your current medications as directed. Please refer to the Current Medication list given to you today.  ?*If you need a refill on your cardiac medications before your next appointment, please call your pharmacy* ? ? ?Lab Work: ?TODAY: BMET, CBC ?If you have labs (blood work) drawn today and your tests are completely normal, you will receive your results only by: ?MyChart Message (if you have MyChart) OR ?A paper copy in the mail ?If you have any lab test that is abnormal or we need to change your treatment, we will call you to review the results. ? ? ?Testing/Procedures: ? ?You are scheduled for a TEE Cardioversion on 07/31/21 with Dr. Johnsie Cancel.  Please arrive at the [facility] (Main Entrance A) at [facility]: [address] [place], [place] [address] at 11 am. (1 hour prior to procedure unless lab work is needed; if lab work is needed arrive 1.5 hours ahead) ? ?DIET: Nothing to eat or drink after midnight except a sip of water with medications (see medication instructions below) ? ?FYI: For your safety, and to allow [place] to monitor your vital signs accurately during the surgery/procedure we request that   ?if you have artificial nails, gel coating, SNS etc. Please have those removed prior to your surgery/procedure. Not having the nail coverings /polish removed may result in cancellation or delay of your surgery/procedure. ? ? ?Medication Instructions: ? ?Continue your anticoagulant: XARELTO ?You will need to continue your anticoagulant after your procedure until you  are told by your  ?Provider that it is safe to stop ? ? ?You must have a responsible person to drive you home and stay in the waiting area during your procedure. Failure to do so could result in cancellation. ? ?Interior and spatial designer cards. ? ?*Special Note: Every effort is made to have your procedure done on time. Occasionally there are emergencies that occur at the hospital  that may cause delays. Please be patient if a delay does occur.   ? ? ?Follow-Up: ?At [facility], you and your health needs are our priority.  As part of our continuing mission to provide you with exceptional heart care, we have created designated Provider Care Teams.  These Care Teams include your primary Cardiologist (physician) and Advanced Practice Providers (APPs -  Physician Assistants and Nurse Practitioners) who all work together to provide you with the care you need, when you need it. ? ?We recommend signing up for the patient portal called "MyChart".  Sign up information is provided on this After Visit Summary.  MyChart is used to connect with patients for Virtual Visits (Telemedicine).  Patients are able to view lab/test results, encounter notes, upcoming appointments, etc.  Non-urgent messages can be sent to your provider as well.   ?To learn more about what you can do with MyChart, go to [email or web address].   ? ?Your next appointment:   ?KEEP SCHEDULED FOLLOW-UP ? ?Important Information About Sugar ? ? ? ? ?  ?

## 2021-07-09 LAB — CBC
Hematocrit: 44.2 % (ref 34.0–46.6)
Hemoglobin: 14.1 g/dL (ref 11.1–15.9)
MCH: 27.2 pg (ref 26.6–33.0)
MCHC: 31.9 g/dL (ref 31.5–35.7)
MCV: 85 fL (ref 79–97)
Platelets: 297 10*3/uL (ref 150–450)
RBC: 5.19 x10E6/uL (ref 3.77–5.28)
RDW: 14.8 % (ref 11.7–15.4)
WBC: 7.1 10*3/uL (ref 3.4–10.8)

## 2021-07-09 LAB — BASIC METABOLIC PANEL
BUN/Creatinine Ratio: 46 — ABNORMAL HIGH (ref 12–28)
BUN: 28 mg/dL — ABNORMAL HIGH (ref 8–27)
CO2: 17 mmol/L — ABNORMAL LOW (ref 20–29)
Calcium: 9.7 mg/dL (ref 8.7–10.3)
Chloride: 108 mmol/L — ABNORMAL HIGH (ref 96–106)
Creatinine, Ser: 0.61 mg/dL (ref 0.57–1.00)
Glucose: 93 mg/dL (ref 70–99)
Potassium: 4.6 mmol/L (ref 3.5–5.2)
Sodium: 143 mmol/L (ref 134–144)
eGFR: 96 mL/min/{1.73_m2} (ref >59)

## 2021-07-11 DIAGNOSIS — Z20822 Contact with and (suspected) exposure to covid-19: Secondary | ICD-10-CM | POA: Diagnosis not present

## 2021-07-12 DIAGNOSIS — Z20822 Contact with and (suspected) exposure to covid-19: Secondary | ICD-10-CM | POA: Diagnosis not present

## 2021-07-13 ENCOUNTER — Encounter: Payer: Self-pay | Admitting: Family Medicine

## 2021-07-13 ENCOUNTER — Telehealth (INDEPENDENT_AMBULATORY_CARE_PROVIDER_SITE_OTHER): Payer: Medicare Other | Admitting: Family Medicine

## 2021-07-13 VITALS — Wt 162.0 lb

## 2021-07-13 DIAGNOSIS — U071 COVID-19: Secondary | ICD-10-CM | POA: Diagnosis not present

## 2021-07-13 MED ORDER — MOLNUPIRAVIR EUA 200MG CAPSULE: 4.0000 | ORAL_CAPSULE | Freq: Two times a day (BID) | ORAL | 0 refills | Status: AC

## 2021-07-13 NOTE — Progress Notes (Signed)
? ?Virtual Visit via Video  ? ?I connected with patient on 07/13/21 at 10:40 AM EDT by a video enabled telemedicine application and verified that I am speaking with the correct person using two identifiers. ? ?Location patient: Home ?Location provider: [facility], Office ?Persons participating in the virtual visit: Patient, Provider, Alleman Marcille Blanco C) ? ?I discussed the limitations of evaluation and management by telemedicine and the availability of in person appointments. The patient expressed understanding and agreed to proceed. ? ?Subjective:  ? ?HPI:  ? ?COVID- pt reports she felt achy Friday night on the way home from work.  Went to work Saturday and 'every hour felt worse and worse'.  Temp at 3pm was 99.9.  Home COVID test was +.  Pt went and got formal testing done at [facility] yesterday- no results yet.  + HA, fever, chills, body aches, sore throat. ? ?ROS:  ? ?See pertinent positives and negatives per HPI. ? ?Patient Active Problem List  ? Diagnosis Date Noted  ? Presence of Watchman left atrial appendage closure device 06/12/2021  ? Atrial fibrillation ([place]) 06/11/2021  ? Prolapse of female genital organs 07/09/2020  ? Chronic pain of both shoulders 11/28/2019  ? Eustachian tube dysfunction, bilateral 11/05/2019  ? Secondary hypercoagulable state ([place]) 03/08/2019  ? Malignant neoplasm of lower-inner quadrant of left breast in female, estrogen receptor positive ([place]) 07/26/2018  ? Anxiety about health 05/31/2018  ? Paroxysmal atrial fibrillation ([place]) 05/18/2018  ? Overweight (BMI 25.0-29.9) 05/18/2018  ? Systolic murmur 55/73/2202  ? Shingles 11/23/2016  ? Arthritis 05/26/2016  ? Insomnia 05/26/2016  ? Neuropathy 05/26/2016  ? Positive ANA (antinuclear antibody) 05/26/2016  ? Hyperlipidemia 05/26/2016  ? Neck and shoulder pain 01/26/2014  ? Cervicalgia 01/26/2014  ? METATARSALGIA 05/06/2010  ? PLANTAR FASCIITIS, LEFT 05/06/2010  ? FOOT PAIN, BILATERAL 05/06/2010  ?  ?Social History  ? ?Tobacco Use  ?  Smoking status: Never  ? Smokeless tobacco: Never  ?Substance Use Topics  ? Alcohol use: Yes  ?  Comment: rare  ? ? ?Current Outpatient Medications:  ?  ALPRAZolam (XANAX) 0.5 MG tablet, Take 1 tablet (0.5 mg total) by mouth 2 (two) times daily as needed for anxiety., Disp: 60 tablet, Rfl: 1 ?  amoxicillin (AMOXIL) 500 MG capsule, Take 4 capsules (2,000 mg total) by mouth as directed. Take 4 tablets 1 hour prior to dental work, including cleanings., Disp: 4 capsule, Rfl: 0 ?  cyanocobalamin (,VITAMIN B-12,) 1000 MCG/ML injection, Inject 1 mL (1,000 mcg total) into the muscle once a week., Disp: 30 mL, Rfl: 3 ?  gabapentin (NEURONTIN) 300 MG capsule, Take 300 mg by mouth at bedtime as needed (nerve pain)., Disp: , Rfl:  ?  meloxicam (MOBIC) 15 MG tablet, Take 1 tablet (15 mg total) by mouth daily. (Patient taking differently: Take 7.5 mg by mouth daily.), Disp: 30 tablet, Rfl: 1 ?  metoprolol tartrate (LOPRESSOR) 25 MG tablet, TAKE 1/2-1 TABLET BY MOUTH EVERY 6-8HOURS FOR BREAKTHROUGH AFIB FOR HEART RATES OVER 100, Disp: 45 tablet, Rfl: 3 ?  polyvinyl alcohol (LIQUIFILM TEARS) 1.4 % ophthalmic solution, Place 1 drop into both eyes as needed for dry eyes., Disp: , Rfl:  ?  rivaroxaban (XARELTO) 20 MG TABS tablet, Take 1 tablet (20 mg total) by mouth daily with supper., Disp: 90 tablet, Rfl: 1 ?  rosuvastatin (CRESTOR) 10 MG tablet, Take 1 tablet (10 mg total) by mouth daily., Disp: 90 tablet, Rfl: 1 ?  valACYclovir (VALTREX) 1000 MG tablet, Take 1 tablet (  1,000 mg total) by mouth daily. (Patient taking differently: Take 1,000 mg by mouth daily as needed (Cold sore).), Disp: 30 tablet, Rfl: 5 ?  zolpidem (AMBIEN) 10 MG tablet, Take 1 tablet (10 mg total) by mouth at bedtime., Disp: 30 tablet, Rfl: 3 ? ?Allergies  ?Allergen Reactions  ? Amitriptyline   ?  Bladder retention  ? Codeine Nausea And Vomiting  ? Nortriptyline   ?  Bladder retention ?  ? ? ?Objective:  ? ?Wt 162 lb (73.5 kg)   BMI 26.96 kg/m?  ?AAOx3,  NAD ?NCAT, EOMI ?No obvious CN deficits ?Coloring WNL ?Pt is able to speak clearly, coherently without shortness of breath or increased work of breathing.  ?Thought process is linear.  Mood is appropriate.  ? ?Assessment and Plan:  ? ?COVID- new.  Informed pt that her + home test is accurate and given that we are w/in 5 days of symptom onset we can treat w/ antiviral medication.  Reviewed appropriate use, dose, and possible side effects.  Discussed supportive care- fluids, rest, tylenol/ibuprofen, cough syrup- and red flags that should prompt return- SOB, confusion, inability to eat or drink.  Letter provided for work.  Pt expressed understanding and is in agreement w/ plan.  ? ? ?Annye Asa, MD ?07/13/2021 ? ? ?

## 2021-07-14 ENCOUNTER — Encounter: Payer: Self-pay | Admitting: Family Medicine

## 2021-07-15 ENCOUNTER — Telehealth: Payer: Self-pay

## 2021-07-15 DIAGNOSIS — Z95818 Presence of other cardiac implants and grafts: Secondary | ICD-10-CM

## 2021-07-15 DIAGNOSIS — I480 Paroxysmal atrial fibrillation: Secondary | ICD-10-CM

## 2021-07-15 NOTE — Telephone Encounter (Signed)
The patient reports she tested positive for Covid late last week and has been prescribed antivirals. She has a cough, congestion and is weak but states she is improving. ? ?She is scheduled for her 45 day Watchman TEE 5/19. In lieu of a TEE, she requests a CT instead so her throat isn't aggravated more.  ? ?She understands Dr. Quentin Ore will be consulted and if he confirmed CT can be done instead, she will be sent those instructions through [place]. ? ?Creatinine= 0.61, GFR= 46 on 07/08/2021. ?

## 2021-07-17 DIAGNOSIS — Z20822 Contact with and (suspected) exposure to covid-19: Secondary | ICD-10-CM | POA: Diagnosis not present

## 2021-07-20 NOTE — Telephone Encounter (Signed)
Per Dr. Quentin Ore, will cancel TEE and arrange CT on 5/15 or 5/19. ?The CT department is aware and will send instructions to patient once scheduled. ?Confirmed plan with patient. ?She was grateful for assistance. ?

## 2021-07-30 ENCOUNTER — Telehealth (HOSPITAL_COMMUNITY): Payer: Self-pay | Admitting: Emergency Medicine

## 2021-07-30 NOTE — Telephone Encounter (Signed)
Reaching out to patient to offer assistance regarding upcoming cardiac imaging study; pt verbalizes understanding of appt date/time, parking situation and where to check in, pre-test NPO status and medications ordered, and verified current allergies; name and call back number provided for further questions should they arise Clarity Ciszek RN Navigator Cardiac Imaging [facility] [phone] office [phone] cell 

## 2021-07-30 NOTE — Telephone Encounter (Signed)
Attempted to call patient regarding upcoming cardiac CT appointment. °Left message on voicemail with name and callback number °Laithan Conchas RN Navigator Cardiac Imaging °[facility] °[phone] Office °[phone] Cell ° °

## 2021-07-31 ENCOUNTER — Ambulatory Visit (HOSPITAL_COMMUNITY): Admission: RE | Admit: 2021-07-31 | Payer: Medicare Other | Source: Home / Self Care | Admitting: Cardiovascular Disease

## 2021-07-31 ENCOUNTER — Ambulatory Visit (HOSPITAL_COMMUNITY)
Admission: RE | Admit: 2021-07-31 | Discharge: 2021-07-31 | Disposition: A | Discharge status: Home / Self Care | Payer: Medicare Other | Source: Ambulatory Visit | Attending: Cardiology | Admitting: Cardiology

## 2021-07-31 ENCOUNTER — Encounter (HOSPITAL_COMMUNITY): Admission: RE | Payer: Self-pay | Source: Home / Self Care

## 2021-07-31 DIAGNOSIS — Z95818 Presence of other cardiac implants and grafts: Secondary | ICD-10-CM | POA: Diagnosis not present

## 2021-07-31 DIAGNOSIS — I480 Paroxysmal atrial fibrillation: Secondary | ICD-10-CM | POA: Diagnosis not present

## 2021-07-31 SURGERY — ECHOCARDIOGRAM, TRANSESOPHAGEAL
Anesthesia: Monitor Anesthesia Care

## 2021-07-31 MED ORDER — IOHEXOL 350 MG/ML SOLN
100.0000 mL | Freq: Once | INTRAVENOUS | Status: AC | PRN
  Administered 2021-07-31: 100 mL via INTRAVENOUS

## 2021-08-03 ENCOUNTER — Encounter: Payer: Self-pay | Admitting: Cardiology

## 2021-08-04 MED ORDER — CLOPIDOGREL BISULFATE 75 MG PO TABS: 75.0000 mg | ORAL_TABLET | Freq: Every day | ORAL | 1 refills | Status: DC

## 2021-08-05 DIAGNOSIS — N819 Female genital prolapse, unspecified: Secondary | ICD-10-CM | POA: Diagnosis not present

## 2021-08-05 DIAGNOSIS — C50112 Malignant neoplasm of central portion of left female breast: Secondary | ICD-10-CM | POA: Diagnosis not present

## 2021-08-05 DIAGNOSIS — N952 Postmenopausal atrophic vaginitis: Secondary | ICD-10-CM | POA: Diagnosis not present

## 2021-08-05 DIAGNOSIS — Z779 Other contact with and (suspected) exposures hazardous to health: Secondary | ICD-10-CM | POA: Diagnosis not present

## 2021-08-05 DIAGNOSIS — Z6828 Body mass index (BMI) 28.0-28.9, adult: Secondary | ICD-10-CM | POA: Diagnosis not present

## 2021-08-05 DIAGNOSIS — N898 Other specified noninflammatory disorders of vagina: Secondary | ICD-10-CM | POA: Diagnosis not present

## 2021-08-11 DIAGNOSIS — M7062 Trochanteric bursitis, left hip: Secondary | ICD-10-CM | POA: Diagnosis not present

## 2021-08-11 DIAGNOSIS — M545 Low back pain, unspecified: Secondary | ICD-10-CM | POA: Diagnosis not present

## 2021-08-17 ENCOUNTER — Telehealth: Payer: Self-pay | Admitting: Hematology and Oncology

## 2021-08-17 NOTE — Telephone Encounter (Signed)
Rescheduled appointment per provider PAL. Left message. 

## 2021-08-28 NOTE — Progress Notes (Signed)
Patient Care Team: [name], MD as PCP - General (Family Medicine) [name], MD as PCP - Cardiology (Cardiology) Vickie Epley, MD as PCP - Electrophysiology (Cardiology) Royston Sinner, Colin Benton, MD as Consulting Physician (Obstetrics and Gynecology) [name], [place] as Consulting Physician (Podiatry) [name], MD as Consulting Physician (Rheumatology) [name], MD as Attending Physician (Orthopedic Surgery) [name], MD as Consulting Physician (Cardiology)  DIAGNOSIS:  Encounter Diagnosis  Name Primary?   Malignant neoplasm of lower-inner quadrant of left breast in female, estrogen receptor positive ([place])     SUMMARY OF ONCOLOGIC HISTORY: Oncology History  Malignant neoplasm of lower-inner quadrant of left breast in female, estrogen receptor positive ([place])  07/26/2018 Initial Diagnosis   Screening mammogram detected a 10cm span of calcifications in the left breast, biopsy confirmed intermediate grade DCIS, ER 100%, PR 100%.    07/26/2018 Cancer Staging   Staging form: Breast, AJCC 8th Edition - Clinical: Stage 0 (cTis (DCIS), cN0, cM0, ER+, PR+) - Signed by Gardenia Phlegm, NP on 07/26/2018   03/20/2019 - 10/2019 Anti-estrogen oral therapy   Tamoxifen, '5mg'$  daily     CHIEF COMPLIANT: Follow-up of left breast DCIS on comet clinical trial surveillance  INTERVAL HISTORY: Lorraine [name] is a 71 y.o. with above-mentioned history of left breast DCIS currently on the COMET clinical trial, randomized to the active surveillance arm. She presents to the clinic today for follow-up. States that she is willing to give the anastrozole a try to see if she can tolerate it. States that if she cant tolerate it she will just stop it.   ALLERGIES:  is allergic to amitriptyline, codeine, and nortriptyline.  MEDICATIONS:  Current Outpatient Medications  Medication Sig Dispense Refill   anastrozole (ARIMIDEX) 1 MG tablet Take 1 tablet (1 mg  total) by mouth daily. 30 tablet 0   albuterol (VENTOLIN HFA) 108 (90 Base) MCG/ACT inhaler TAKE 2 PUFFS BY MOUTH EVERY 6 HOURS AS NEEDED FOR WHEEZE OR SHORTNESS OF BREATH     ALPRAZolam (XANAX) 0.5 MG tablet Take 1 tablet (0.5 mg total) by mouth 2 (two) times daily as needed for anxiety. 60 tablet 1   amoxicillin (AMOXIL) 500 MG capsule Take 4 capsules (2,000 mg total) by mouth as directed. Take 4 tablets 1 hour prior to dental work, including cleanings. 4 capsule 0   clopidogrel (PLAVIX) 75 MG tablet Take 1 tablet (75 mg total) by mouth daily. 90 tablet 1   cyanocobalamin (,VITAMIN B-12,) 1000 MCG/ML injection Inject 1 mL (1,000 mcg total) into the muscle once a week. 30 mL 3   gabapentin (NEURONTIN) 300 MG capsule Take 300 mg by mouth at bedtime as needed (nerve pain).     meloxicam (MOBIC) 15 MG tablet Take 1 tablet (15 mg total) by mouth daily. (Patient taking differently: Take 7.5 mg by mouth daily.) 30 tablet 1   polyvinyl alcohol (LIQUIFILM TEARS) 1.4 % ophthalmic solution Place 1 drop into both eyes as needed for dry eyes.     rosuvastatin (CRESTOR) 10 MG tablet Take 1 tablet (10 mg total) by mouth daily. 90 tablet 1   valACYclovir (VALTREX) 1000 MG tablet Take 1 tablet (1,000 mg total) by mouth daily. (Patient taking differently: Take 1,000 mg by mouth daily as needed (Cold sore).) 30 tablet 5   zolpidem (AMBIEN) 10 MG tablet Take 1 tablet (10 mg total) by mouth at bedtime. 30 tablet 3   No current facility-administered medications for this visit.  PHYSICAL EXAMINATION: ECOG PERFORMANCE STATUS: 1 - Symptomatic but completely ambulatory  Vitals:   09/11/21 0806  BP: (!) 147/83  Pulse: 60  Resp: 18  Temp: (!) 97.4 F (36.3 C)  SpO2: 99%   Filed Weights   09/11/21 0806  Weight: 167 lb 3.2 oz (75.8 kg)      LABORATORY DATA:  I have reviewed the data as listed    Latest Ref Rng & Units 07/08/2021    8:54 AM 06/12/2021   12:45 AM 06/02/2021   11:01 AM  CMP  Glucose 70 -  99 mg/dL 93  110  80   BUN 8 - 27 mg/dL '28  28  21   '$ Creatinine 0.57 - 1.00 mg/dL 0.61  0.79  0.62   Sodium 134 - 144 mmol/L 143  140  140   Potassium 3.5 - 5.2 mmol/L 4.6  4.1  4.4   Chloride 96 - 106 mmol/L 108  110  105   CO2 20 - 29 mmol/L '17  23  23   '$ Calcium 8.7 - 10.3 mg/dL 9.7  9.1  10.1   Total Protein 6.0 - 8.3 g/dL   6.9   Total Bilirubin 0.2 - 1.2 mg/dL   1.0   Alkaline Phos 39 - 117 U/L   93   AST 0 - 37 U/L   18   ALT 0 - 35 U/L   13     Lab Results  Component Value Date   WBC 7.1 07/08/2021   HGB 14.1 07/08/2021   HCT 44.2 07/08/2021   MCV 85 07/08/2021   PLT 297 07/08/2021   NEUTROABS 3.7 06/02/2021    ASSESSMENT & PLAN:  Malignant neoplasm of lower-inner quadrant of left breast in female, estrogen receptor positive ([place]) 07/17/2018: Screening mammogram detected a 10cm span of calcifications in the left breast, biopsy confirmed intermediate grade DCIS, ER 100%, PR 100% Stage 0   Current treatment:  1. COMET clinical trial randomized to active surveillance arm 2. Tamoxifen 20 mg daily started 09/20/2018 stopped 11/01/2018.  Vaginal dryness ([name] treatment 11/16/2018) restarted tamoxifen 5 mg 03/30/2019 discontinued tamoxifen in August 2021. 3.  Starting anastrozole 09/11/2021 (because the mammogram showed increasing calcifications)   Breast cancer surveillance: Bilateral mammogram  09/03/2021: Mild increase in left breast calcifications span 8.9 by 7.3 x 4 cm previously it was 9 x 5.4 x 4.3 cm.  We will check on the clinical trials protocol if there is any change in the management required.  I do not intend to biopsy for the slight change.   Peripheral neuropathy: Unclear etiology.  Takes B12 injections at bariatric office.  I discussed the pros and cons of antiestrogen therapy and we recommended anastrozole.  She is willing to try it for a month and will call us if she tolerates it well so she can take it on a long-term basis.  Return to clinic in 6 months for  follow-up    No orders of the defined types were placed in this encounter.  The patient has a good understanding of the overall plan. she agrees with it. she will call with any problems that may develop before the next visit here. Total time spent: 30 mins including face to face time and time spent for planning, charting and co-ordination of care   Harriette Ohara, MD 09/11/21    I Gardiner Coins am scribing for Dr. Lindi Adie  I have reviewed the above documentation for accuracy and completeness, and I agree with  the above. Marland Kitchen

## 2021-09-03 ENCOUNTER — Other Ambulatory Visit: Payer: Self-pay | Admitting: Hematology and Oncology

## 2021-09-03 ENCOUNTER — Other Ambulatory Visit: Payer: Self-pay | Admitting: Family Medicine

## 2021-09-03 ENCOUNTER — Ambulatory Visit
Admission: RE | Admit: 2021-09-03 | Discharge: 2021-09-03 | Disposition: A | Discharge status: Home / Self Care | Payer: Medicare Other | Source: Ambulatory Visit | Attending: Family Medicine | Admitting: Family Medicine

## 2021-09-03 DIAGNOSIS — Z853 Personal history of malignant neoplasm of breast: Secondary | ICD-10-CM

## 2021-09-03 DIAGNOSIS — D0512 Intraductal carcinoma in situ of left breast: Secondary | ICD-10-CM

## 2021-09-03 DIAGNOSIS — R921 Mammographic calcification found on diagnostic imaging of breast: Secondary | ICD-10-CM | POA: Diagnosis not present

## 2021-09-07 ENCOUNTER — Ambulatory Visit: Payer: Medicare Other | Admitting: Hematology and Oncology

## 2021-09-10 NOTE — Assessment & Plan Note (Addendum)
07/17/2018:Screening mammogram detected a 10cm span of calcifications in the left breast, biopsy confirmed intermediate grade DCIS, ER 100%, PR 100% Stage 0  Current treatment: 1. COMETclinical trial randomized to active surveillance arm 2.Tamoxifen 20 mg daily started 7/8/2020stopped 11/01/2018. Vaginal dryness ([name] treatment 11/16/2018) restarted tamoxifen 5 mg 03/30/2019 discontinued tamoxifen in August 2021.  Breast cancer surveillance: Bilateral mammogram6/22/2023: Mild increase in left breast calcifications span 8.9 cm Breast exam 09/11/2021: Benign  Peripheral neuropathy:Unclear etiology.  Takes B12 injections at bariatric office.  I discussed the pros and cons of antiestrogen therapy and we recommended anastrozole.  She is willing to try it for a month and will call us if she tolerates it well so she can take it on a long-term basis.  Return to clinic in 6 months for follow-up

## 2021-09-11 ENCOUNTER — Other Ambulatory Visit: Payer: Self-pay | Admitting: *Deleted

## 2021-09-11 ENCOUNTER — Other Ambulatory Visit: Payer: Self-pay

## 2021-09-11 ENCOUNTER — Inpatient Hospital Stay: Payer: Medicare Other | Attending: Hematology and Oncology | Admitting: Hematology and Oncology

## 2021-09-11 DIAGNOSIS — C50312 Malignant neoplasm of lower-inner quadrant of left female breast: Secondary | ICD-10-CM | POA: Insufficient documentation

## 2021-09-11 DIAGNOSIS — Z006 Encounter for examination for normal comparison and control in clinical research program: Secondary | ICD-10-CM | POA: Insufficient documentation

## 2021-09-11 DIAGNOSIS — G629 Polyneuropathy, unspecified: Secondary | ICD-10-CM | POA: Diagnosis not present

## 2021-09-11 DIAGNOSIS — Z170 Estrogen receptor positive status [ER+]: Secondary | ICD-10-CM | POA: Insufficient documentation

## 2021-09-11 MED ORDER — ANASTROZOLE 1 MG PO TABS: 1.0000 mg | ORAL_TABLET | Freq: Every day | ORAL | 0 refills | Status: DC

## 2021-09-11 MED ORDER — ANASTROZOLE 1 MG PO TABS
1.0000 mg | ORAL_TABLET | Freq: Every day | ORAL | 0 refills | Status: DC
Start: 2021-09-11 — End: 2021-09-25

## 2021-09-16 NOTE — Research (Signed)
TRIAL AFT - 25: COMPARING AN OPERATION TO MONITORING, WITH OR WITHOUT ENDOCRINE THERAPY (COMET) FOR LOW RISK DCIS: A PHASE III PROSPECTIVE RANDOMIZED TRIAL   Patient arrives today Unaccompanied for the 36 month.    PROs: Per study protocol, all PROs required for this visit were completed prior to other study activities and completeness has been verified; she has already completed them by email   LABS: No labs were required today.   MEDICATION REVIEW: Patient reviews and verifies the current medication list is correct.  VITAL SIGNS: Vital signs are collected per study protocol.  MD/PROVIDER VISIT: Patient sees Dr Lindi Adie for today's visit.   ADVERSE EVENTS: Patient MOXIE KALIL reports AEs as below. Attribution by Dr Lindi Adie. CTCAE version 4.0.  ADVERSE EVENT LOGDANIELYS MADRY [id number]  09/16/2021  Adverse Event Log  Study/Protocol: AFT - 25: COMPARING AN OPERATION TO MONITORING, WITH OR WITHOUT ENDOCRINE THERAPY (COMET) FOR LOW RISK DCIS: A PHASE III PROSPECTIVE RANDOMIZED TRIAL Cycle: 36 month visit   Event Grade Attribution Date started Date resolved Comments  Arthralgia 2 Unrelated  Ongoing Baseline comorbidity of arthritis. Unchanged. Pt takes Mobic.  Myalgia 1 Unrelated  Ongoing Pt reports mild leg pain that does not limit her ADLs.   Hypertension  1 Unrelated  Ongoing Baseline comorbidity. Patient monitors her BP at home. Pt's BP 132/63 today.   Osteopenia 1 Unrelated  Ongoing Mild, per patient report. Patient gets bone density scans every 2 years.   Cholesterol high 0 N/A  Ongoing Baseline comorbidity. Has not had labs re-evaluated since March 2022, when they were WNL. Pt takes Crestor.  Peripheral neuropathy; sensory 2 Unrelated  Ongoing Moderate pain. Pt states that she has had "foot issues for years" that do not limit her ADLs. Pt recently started Gabapentin, and MD sent rx for B12 and referral for dry needling.   Fever  1 Unrelated April 2023 April 2023 Due to COVID  infection in April 2023; exact dates unknown  Atrial fibrillation 3 Unrelated January 2023 Ongoing Had ablation in Jan 2023 & Watchman placed March 2023    GIFT CARD: This study does not provide visit compensation.   DISPOSITION: Upon completion off all study requirements, patient was left in exam room waiting for Dr Lindi Adie.   The patient was thanked for their time and continued voluntary participation in this study. Patient Lorraine [name] has been provided direct contact information and is encouraged to contact this Nurse for any needs or questions.  Marjie Skiff Nikolaus Pienta, RN, BSN, [facility]  Her  [facility] Clinical Research Nurse [place] [phone]  Pager [phone] 09/16/2021 9:34 AM

## 2021-09-21 ENCOUNTER — Other Ambulatory Visit: Payer: Self-pay | Admitting: Family Medicine

## 2021-09-21 NOTE — Telephone Encounter (Signed)
Patient is requesting a refill of the following medications: Requested Prescriptions   Pending Prescriptions Disp Refills   meloxicam (MOBIC) 15 MG tablet [Pharmacy Med Name: MELOXICAM 15 MG TABLET] 30 tablet 1    Sig: TAKE 1 TABLET (15 MG TOTAL) BY MOUTH DAILY.    Date of patient request: 09/21/2021 Last office visit: 06/02/2021 Date of last refill: 06/02/2021 Last refill amount: 30 tablets 1 refill Follow up time period per chart: 12/01/2021

## 2021-09-25 ENCOUNTER — Encounter: Payer: Self-pay | Admitting: Hematology and Oncology

## 2021-09-25 ENCOUNTER — Other Ambulatory Visit: Payer: Self-pay | Admitting: *Deleted

## 2021-09-25 MED ORDER — ANASTROZOLE 1 MG PO TABS: 1.0000 mg | ORAL_TABLET | Freq: Every day | ORAL | 3 refills | Status: DC

## 2021-09-26 ENCOUNTER — Encounter: Payer: Self-pay | Admitting: Cardiology

## 2021-09-29 MED ORDER — RIVAROXABAN 20 MG PO TABS: 20.0000 mg | ORAL_TABLET | Freq: Every day | ORAL | 0 refills | Status: DC

## 2021-09-30 ENCOUNTER — Encounter: Payer: Self-pay | Admitting: *Deleted

## 2021-09-30 NOTE — Progress Notes (Signed)
AFT - 25: COMPARING AN OPERATION TO MONITORING, WITH OR WITHOUT ENDOCRINE THERAPY (COMET) FOR LOW RISK DCIS: A PHASE III PROSPECTIVE RANDOMIZED TRIAL   The research nurse called the pt to see when she started taking her anastrozole.  Her MD ordered it on 09/11/21.  The pt said that she picked up the medication and began taking it on 09/11/21.  The pt said that she is tolerating the medication well with no known side effects.  The pt denies any other health problems at this time.  The pt is aware of her next study follow up visit in December 2023.  The pt was thanked for her continued support of the Comet study.  Brion Aliment RN, BSN, CCRP Clinical Research Nurse Lead 09/30/2021 1:43 PM

## 2021-10-01 DIAGNOSIS — M5416 Radiculopathy, lumbar region: Secondary | ICD-10-CM | POA: Diagnosis not present

## 2021-10-01 DIAGNOSIS — M4316 Spondylolisthesis, lumbar region: Secondary | ICD-10-CM | POA: Diagnosis not present

## 2021-10-05 DIAGNOSIS — M5416 Radiculopathy, lumbar region: Secondary | ICD-10-CM | POA: Diagnosis not present

## 2021-10-14 ENCOUNTER — Encounter: Payer: Self-pay | Admitting: Family Medicine

## 2021-10-16 ENCOUNTER — Encounter: Payer: Self-pay | Admitting: Family Medicine

## 2021-10-16 MED ORDER — ZOLPIDEM TARTRATE 10 MG PO TABS: 10.0000 mg | ORAL_TABLET | Freq: Every day | ORAL | 3 refills | Status: DC

## 2021-10-16 NOTE — Addendum Note (Signed)
Addended by: [name] on: 10/16/2021 03:42 PM   Modules accepted: Orders

## 2021-10-29 ENCOUNTER — Encounter: Payer: Self-pay | Admitting: Cardiology

## 2021-11-02 ENCOUNTER — Encounter: Payer: Self-pay | Admitting: Family Medicine

## 2021-11-02 ENCOUNTER — Encounter: Payer: Self-pay | Admitting: Podiatry

## 2021-11-02 ENCOUNTER — Other Ambulatory Visit: Payer: Self-pay | Admitting: Family

## 2021-11-02 MED ORDER — ALPRAZOLAM 0.5 MG PO TABS
0.5000 mg | ORAL_TABLET | Freq: Two times a day (BID) | ORAL | 1 refills | Status: DC | PRN
Start: 2021-11-02 — End: 2022-01-15

## 2021-11-02 MED ORDER — GABAPENTIN 300 MG PO CAPS: 300.0000 mg | ORAL_CAPSULE | Freq: Three times a day (TID) | ORAL | 0 refills | Status: DC

## 2021-11-02 MED ORDER — GABAPENTIN 100 MG PO CAPS: 100.0000 mg | ORAL_CAPSULE | Freq: Three times a day (TID) | ORAL | 0 refills | Status: DC

## 2021-11-11 ENCOUNTER — Encounter: Payer: Self-pay | Admitting: Family Medicine

## 2021-11-11 ENCOUNTER — Ambulatory Visit (INDEPENDENT_AMBULATORY_CARE_PROVIDER_SITE_OTHER): Payer: Medicare Other | Admitting: Family Medicine

## 2021-11-11 VITALS — BP 116/70 | HR 61 | Temp 98.6°F | Resp 16 | Ht 65.0 in | Wt 172.4 lb

## 2021-11-11 DIAGNOSIS — L255 Unspecified contact dermatitis due to plants, except food: Secondary | ICD-10-CM | POA: Diagnosis not present

## 2021-11-11 DIAGNOSIS — B372 Candidiasis of skin and nail: Secondary | ICD-10-CM

## 2021-11-11 DIAGNOSIS — M5416 Radiculopathy, lumbar region: Secondary | ICD-10-CM | POA: Diagnosis not present

## 2021-11-11 MED ORDER — TRIAMCINOLONE ACETONIDE 0.1 % EX OINT: 1.0000 | TOPICAL_OINTMENT | Freq: Two times a day (BID) | CUTANEOUS | 1 refills | Status: DC

## 2021-11-11 MED ORDER — NYSTATIN 100000 UNIT/GM EX CREA: 1.0000 | TOPICAL_CREAM | Freq: Two times a day (BID) | CUTANEOUS | 1 refills | Status: DC

## 2021-11-11 NOTE — Progress Notes (Signed)
   Subjective:    Patient ID: Lorraine [name], female    DOB: [birth year], 71 y.o.   MRN: [id number]  HPI Rash- was working in the garden on Saturday and then noticed a small area of redness in her R groin.  This has spread considerably and is extending into inner thigh and laterally up her side itchy and painful.  Also has areas on bilateral forearms that are itching and oozing   Review of Systems For ROS see HPI     Objective:   Physical Exam Vitals reviewed.  Constitutional:      General: She is not in acute distress.    Appearance: Normal appearance. She is not ill-appearing.  HENT:     Head: Normocephalic and atraumatic.  Eyes:     Extraocular Movements: Extraocular movements intact.     Conjunctiva/sclera: Conjunctivae normal.     Pupils: Pupils are equal, round, and reactive to light.  Skin:    General: Skin is warm.     Findings: Rash (hyperpigmented confluent rash in R groin crease w/ satellite lesions) present.     Comments: Vesicular rash on forearms bilaterally and along R ribs consistent w/ plant dermatitis  Neurological:     General: No focal deficit present.     Mental Status: She is alert and oriented to person, place, and time.  Psychiatric:        Mood and Affect: Mood normal.        Behavior: Behavior normal.        Thought Content: Thought content normal.           Assessment & Plan:   Yeast Dermatitis- pt has 2 separate issues today.  The hyperpigmented area in her groin w/ satellite lesions is consistent w/ yeast.  Start Nystatin cream twice daily.  Pt expressed understanding and is in agreement w/ plan.   Plant dermatitis- new.  On forearms bilaterally and R lateral ribs.  Consistent w/ plant dermatitis- very itchy.  Will start Triamcinolone ointment BID.  Reviewed importance of washing clothes to neutralize any remaining plant oil.  Antihistamine prn.  Pt expressed understanding and is in agreement w/ plan.

## 2021-11-11 NOTE — Patient Instructions (Signed)
Follow up as needed or as scheduled START the Nystatin cream on the hyperpigmented area in the groin USE the Triamcinolone ointment on the arms and rib/side area for the plant rash Call with any questions or concerns Hang in there!!!

## 2021-11-17 ENCOUNTER — Encounter (HOSPITAL_BASED_OUTPATIENT_CLINIC_OR_DEPARTMENT_OTHER): Payer: Self-pay | Admitting: Internal Medicine

## 2021-11-17 ENCOUNTER — Ambulatory Visit (INDEPENDENT_AMBULATORY_CARE_PROVIDER_SITE_OTHER): Payer: Medicare Other | Admitting: Internal Medicine

## 2021-11-17 VITALS — BP 128/64 | HR 64 | Ht 65.0 in | Wt 173.4 lb

## 2021-11-17 DIAGNOSIS — Q231 Congenital insufficiency of aortic valve: Secondary | ICD-10-CM

## 2021-11-17 DIAGNOSIS — I480 Paroxysmal atrial fibrillation: Secondary | ICD-10-CM | POA: Diagnosis not present

## 2021-11-17 DIAGNOSIS — E785 Hyperlipidemia, unspecified: Secondary | ICD-10-CM

## 2021-11-17 MED ORDER — EZETIMIBE 10 MG PO TABS: 10.0000 mg | ORAL_TABLET | Freq: Every day | ORAL | 3 refills | Status: DC

## 2021-11-17 NOTE — Progress Notes (Signed)
OFFICE NOTE  Chief Complaint:  Follow-up  Primary Care Physician: [name], MD  HPI:  Lorraine [name] is a pleasant 71 year old female who works as a Freight forwarder at [employer].  She was referred to [place] for evaluation of missed or skipped beats. She does not report any awareness of palpitations, however she notices that she occasionally skips beats. This actually may occur on a daily basis. Has been present for many years, however recently was brought to her attention by her primary care doctor. She is actually fairly active, and belongs to the [employer]. She is often swimming or doing land exercises, and does not have any significant limitation to exercise. She denies any chest pain or worsening shortness of breath. Recently she was trying to lose weight and was prescribed phentermine. This did cause marked increase in her palpitations and she discontinued the medicine secondary to that.  07/04/2019  [name] is seen today for preoperative clearance.  She is considered a new patient as I last saw her in 2014 for PVCs.  In January 2020 she underwent surgery and postoperatively was found to have A. fib with RVR.  She was taken to [facility] and ultimately converted.  Since then she has been followed in the A. fib clinic by Adline Peals, PA-C.  He did a thorough work-up including an echo and a Myoview stress test earlier last year.  The stress test was negative for ischemia the echo showed normal LV function however she did have mild aortic insufficiency which was noted on exam.  Symptomatically she is well.  She denies chest pain or worsening shortness of breath however notes significant shoulder pain for which she has been evaluated for surgery.  12/31/2019  [name] returns today for follow-up.  She reports a slow recovery after recent shoulder surgery.  In fact she says since then she has been short of breath with minimal exertion.  She says it seems to be getting  worse.  She started doing more exercise but it does not seem to be improving.  She denies any symptoms of atrial fibrillation, in fact questions whether she actually had it although we did have evidence of it.  She has been compliant with her Xarelto.  Labs as of September 15 showed no evidence of anemia with an H&H of 14 and 42.  EKG today shows no atrial fibrillation rather a sinus bradycardia with frequent PVCs.  She says she is unaware of the PVCs.  She does note when she exercises her heart rate elevates.  At times she notes tachypalpitations which can occur with exertion or at rest.  This could be atrial fibrillation.  She has not taken her metoprolol regularly due to bradycardia and therefore is at risk for breakthrough A. fib with RVR.   11/17/2021  [name] returns today for follow-up.  She underwent successful A-fib ablation with Dr. Quentin Ore.  She reports basically resolution of her A-fib symptoms.  Subsequently she had placement of a Watchman left atrial appendage occluder device.  Is now close to 6 months after that and she is done well.  She is maintained on Xarelto since she cannot tolerate Plavix and hopes to discontinue the Xarelto at the end of this month and start low-dose aspirin 81 mg daily.  I reminded her that we also noted that she has an abnormal, bicuspid aortic valve as well as a dilated aortic root.  This will need to be followed by echocardiography at  least annually.  Overall she is doing well and she is asymptomatic.  She would like to reduce medications as much as possible.  She is interested in getting off her statin.  She thinks she is having side effects related to that.  She has been taking it very sparingly but more recently 10 mg daily since June.  PMHx:  Past Medical History:  Diagnosis Date   Anxiety    Aortic atherosclerosis ([place]) 02/03/2018   Noted on CT Abd/Pelvis   AR (aortic regurgitation) 07/18/2017   Mild, noted on ECHO   Bilateral cataracts    Bilateral  plantar fasciitis    Chronic knee pain    Chronic low back pain    Chronic lumbar radiculopathy 07/14/2017   Mild, L5, noted on electromyography   History of palpitations    2014-- low risk exercise tolerence test, no ischemi, PVC's (09-06-2012)   HSV-1 (herpes simplex virus 1) infection    Cold sore   Hx of colonic polyps    Hyperlipidemia    Insomnia    Lactose intolerance    Left nephrolithiasis 02/03/2018   Nonobstructing, noted on CT Abd/pelvis   Morton's metatarsalgia, neuralgia, or neuroma, bilateral    Osteoarthritis    knees and lumbar, feet   Osteopenia    Peripheral neuropathy    bilateral feet -- burning and stinging   PONV (postoperative nausea and vomiting)    severe   Presence of Watchman left atrial appendage closure device 06/11/2021   Watchman 50m FLX with Dr. LQuentin Ore  PVC's (premature ventricular contractions)    Systolic murmur    "Slight"   Vaginal cyst    Wears glasses     Past Surgical History:  Procedure Laterality Date   ANTERIOR CERVICAL DECOMP/DISCECTOMY FUSION  01/2014   C4 -- C6   ATRIAL FIBRILLATION ABLATION N/A 04/09/2021   Procedure: ATRIAL FIBRILLATION ABLATION;  Surgeon: LVickie Epley MD;  [facility];  Service: Cardiovascular;  Laterality: N/A;   CATARACT EXTRACTION W/ INTRAOCULAR LENS  IMPLANT, BILATERAL  2015   COLONOSCOPY  05/14/2015   CYSTO WITH HYDRODISTENSION N/A 04/14/2018   Procedure: CYSTOSCOPY/HYDRODISTENSION WITH INSTILLATION OF PYRIDIUM AND MARCAINE, BLADDER BIOPSY WITH FULGERATION 0.5 TO 2 CM;  Surgeon: EFestus Aloe MD;  [facility]  Service: Urology;  Laterality: N/A;   ELBOW DEBRIDEMENT Right 09/10/2010   and tendon debridement and radial tunnel release   EXCISION MORTON'S NEUROMA Bilateral left 02-20-2009/  right & left 05-08-2009   EXCISION VAGINAL CYST N/A 07/01/2016   Procedure: EXCISION VAGINAL CYST;  Surgeon: ETyson Dense MD;  [facility]  Service: Gynecology;  Laterality: N/A;   LEFT ATRIAL APPENDAGE OCCLUSION N/A 06/11/2021   Procedure: LEFT ATRIAL APPENDAGE OCCLUSION;  Surgeon: LVickie Epley MD;  [facility];  Service: Cardiovascular;  Laterality: N/A;   PLANTAR FASCIA RELEASE Bilateral left 04-02-2010/  right 04-02-2016   TEE WITHOUT CARDIOVERSION N/A 06/11/2021   Procedure: TRANSESOPHAGEAL ECHOCARDIOGRAM (TEE);  Surgeon: LVickie Epley MD;  [facility];  Service: Cardiovascular;  Laterality: N/A;   TONSILLECTOMY AND ADENOIDECTOMY  child   TOTAL ABDOMINAL HYSTERECTOMY  1974   w/  Left salpingoophorectomy and Partial right salpingoophorectomy    FAMHx:  Family History  Problem Relation Age of Onset   Lung cancer Mother        lung   Heart attack Father    Memory loss Father  Cancer Paternal Grandfather        colon   Multiple sclerosis Brother    No history of palpitations in the family.  SOCHx:   reports that she has never smoked. She has never used smokeless tobacco. She reports current alcohol use. She reports that she does not use drugs.  ALLERGIES:  Allergies  Allergen Reactions   Amitriptyline Nausea Only    Bladder retention   Codeine Nausea And Vomiting and Rash   Nortriptyline Rash    Bladder retention     ROS: Pertinent items noted in HPI and remainder of comprehensive ROS otherwise negative.  HOME MEDS: Current Outpatient Medications  Medication Sig Dispense Refill   ALPRAZolam (XANAX) 0.5 MG tablet Take 1 tablet (0.5 mg total) by mouth 2 (two) times daily as needed for anxiety. 60 tablet 1   anastrozole (ARIMIDEX) 1 MG tablet Take 1 tablet (1 mg total) by mouth daily. 90 tablet 3   cyanocobalamin (,VITAMIN B-12,) 1000 MCG/ML injection Inject 1 mL (1,000 mcg total) into the muscle once a week. 30 mL 3   gabapentin (NEURONTIN) 100 MG capsule Take 1 capsule (100 mg total) by mouth 3 (three) times daily. 90 capsule 0   gabapentin  (NEURONTIN) 300 MG capsule Take 1 capsule (300 mg total) by mouth 3 (three) times daily. 90 capsule 0   meloxicam (MOBIC) 15 MG tablet Take 0.5 tablets (7.5 mg total) by mouth daily. 30 tablet 0   nystatin cream (MYCOSTATIN) Apply 1 Application topically 2 (two) times daily. 30 g 1   polyvinyl alcohol (LIQUIFILM TEARS) 1.4 % ophthalmic solution Place 1 drop into both eyes as needed for dry eyes.     rivaroxaban (XARELTO) 20 MG TABS tablet Take 1 tablet (20 mg total) by mouth daily with supper. 90 tablet 0   rosuvastatin (CRESTOR) 10 MG tablet Take 1 tablet (10 mg total) by mouth daily. 90 tablet 1   triamcinolone ointment (KENALOG) 0.1 % Apply 1 Application topically 2 (two) times daily. 90 g 1   valACYclovir (VALTREX) 1000 MG tablet Take 1 tablet (1,000 mg total) by mouth daily. (Patient taking differently: Take 1,000 mg by mouth daily as needed (Cold sore).) 30 tablet 5   zolpidem (AMBIEN) 10 MG tablet Take 1 tablet (10 mg total) by mouth at bedtime. 30 tablet 3   No current facility-administered medications for this visit.    LABS/IMAGING: No results found for this or any previous visit (from the past 48 hour(s)). No results found.  VITALS: BP 128/64   Pulse 64   Ht '5\' 5"'$  (1.651 m)   Wt 173 lb 6.4 oz (78.7 kg)   SpO2 98%   BMI 28.86 kg/m   EXAM: General appearance: alert and no distress Neck: no carotid bruit, no JVD and thyroid not enlarged, symmetric, no tenderness/mass/nodules Lungs: clear to auscultation bilaterally Heart: regular rate and rhythm, S1, S2 normal and diastolic murmur: early diastolic 2/6, blowing at lower left sternal border Abdomen: soft, non-tender; bowel sounds normal; no masses,  no organomegaly Extremities: extremities normal, atraumatic, no cyanosis or edema Pulses: 2+ and symmetric Skin: Skin color, texture, turgor normal. No rashes or lesions Neurologic: Grossly normal  EKG: N/A  ASSESSMENT: Paroxysmal atrial fibrillation-CHA2DS2-VASc score of 2,  on Xarelto, s/p afib ablation and LAA occlusion (Watchman) - 05/2021 Frequent PVCs Dyslipidemia Aortic insufficiency Biscupid aortic valve - dilated aortic root  PLAN: 1.   Ms. Schaper has had successful A-fib ablation as well as closure of the left atrial  appendage with Watchman device.  She will plan to stop Xarelto later this month and stay on low-dose aspirin.  She reports good improvement in her symptoms regarding the A-fib.  She does have aortic insufficiency and was found to have a bicuspid aortic valve along with a dilated aorta.  This will need to be followed by echocardiography.  We will plan a repeat echo in 6 months and follow-up with me afterwards.  With regards to her lipids, I advised her to stop her rosuvastatin.  Will trial ezetimibe 10 mg daily but advised her not to start it until she is asymptomatic and the statin has washed out.  Repeat lipids at follow-up in 6 months as well.  [name], MD, [facility], [place] Director of the Advanced Lipid Disorders &  Cardiovascular Risk Reduction Clinic Diplomate of the American Board of Clinical Lipidology Attending Cardiologist  Direct Dial: [phone]  Fax: [phone]  Website:  [email or web address] Osgood [place] 11/17/2021, 8:05 AM

## 2021-11-17 NOTE — Patient Instructions (Addendum)
Medication Instructions:  STOP crestor  START zetia '10mg'$  dail y  *If you need a refill on your cardiac medications before your next appointment, please call your pharmacy*   Lab Work: Lipid Panel, TSH, CMET, CBC w/diff  Fasting Lipid Panel in 6 months -- before next visit   If you have labs (blood work) drawn today and your tests are completely normal, you will receive your results only by: [place] (if you have MyChart) OR A paper copy in the mail If you have any lab test that is abnormal or we need to change your treatment, we will call you to review the results.  Testing:  Your physician has requested that you have an echocardiogram. Echocardiography is a painless test that uses sound waves to create images of your heart. It provides your doctor with information about the size and shape of your heart and how well your heart's chambers and valves are working. This procedure takes approximately one hour. There are no restrictions for this procedure. Due March 2024   Follow-Up: At [facility], you and your health needs are our priority.  As part of our continuing mission to provide you with exceptional heart care, we have created designated Provider Care Teams.  These Care Teams include your primary Cardiologist (physician) and Advanced Practice Providers (APPs -  Physician Assistants and Nurse Practitioners) who all work together to provide you with the care you need, when you need it.  We recommend signing up for the patient portal called "MyChart".  Sign up information is provided on this After Visit Summary.  MyChart is used to connect with patients for Virtual Visits (Telemedicine).  Patients are able to view lab/test results, encounter notes, upcoming appointments, etc.  Non-urgent messages can be sent to your provider as well.   To learn more about what you can do with MyChart, go to [email or web address].    Your next appointment:   6 month(s)  The format for  your next appointment:   In Person  Provider:   [name], MD

## 2021-11-18 ENCOUNTER — Other Ambulatory Visit: Payer: Self-pay | Admitting: *Deleted

## 2021-11-18 DIAGNOSIS — E785 Hyperlipidemia, unspecified: Secondary | ICD-10-CM

## 2021-11-18 LAB — COMPREHENSIVE METABOLIC PANEL
ALT: 17 IU/L (ref 0–32)
AST: 15 IU/L (ref 0–40)
Albumin/Globulin Ratio: 2.2 (ref 1.2–2.2)
Albumin: 4.4 g/dL (ref 3.8–4.8)
Alkaline Phosphatase: 95 IU/L (ref 44–121)
BUN/Creatinine Ratio: 43 — ABNORMAL HIGH (ref 12–28)
BUN: 29 mg/dL — ABNORMAL HIGH (ref 8–27)
Bilirubin Total: 0.5 mg/dL (ref 0.0–1.2)
CO2: 19 mmol/L — ABNORMAL LOW (ref 20–29)
Calcium: 9.5 mg/dL (ref 8.7–10.3)
Chloride: 105 mmol/L (ref 96–106)
Creatinine, Ser: 0.68 mg/dL (ref 0.57–1.00)
Globulin, Total: 2 g/dL (ref 1.5–4.5)
Glucose: 96 mg/dL (ref 70–99)
Potassium: 4.3 mmol/L (ref 3.5–5.2)
Sodium: 141 mmol/L (ref 134–144)
Total Protein: 6.4 g/dL (ref 6.0–8.5)
eGFR: 93 mL/min/{1.73_m2} (ref >59)

## 2021-11-18 LAB — CBC WITH DIFFERENTIAL/PLATELET
Basophils Absolute: 0.1 10*3/uL (ref 0.0–0.2)
Basos: 1 %
EOS (ABSOLUTE): 0.8 10*3/uL — ABNORMAL HIGH (ref 0.0–0.4)
Eos: 9 %
Hematocrit: 43.5 % (ref 34.0–46.6)
Hemoglobin: 14.1 g/dL (ref 11.1–15.9)
Immature Grans (Abs): 0 10*3/uL (ref 0.0–0.1)
Immature Granulocytes: 0 %
Lymphocytes Absolute: 3.2 10*3/uL — ABNORMAL HIGH (ref 0.7–3.1)
Lymphs: 36 %
MCH: 27.6 pg (ref 26.6–33.0)
MCHC: 32.4 g/dL (ref 31.5–35.7)
MCV: 85 fL (ref 79–97)
Monocytes Absolute: 0.6 10*3/uL (ref 0.1–0.9)
Monocytes: 7 %
Neutrophils Absolute: 4.3 10*3/uL (ref 1.4–7.0)
Neutrophils: 47 %
Platelets: 278 10*3/uL (ref 150–450)
RBC: 5.11 x10E6/uL (ref 3.77–5.28)
RDW: 14.6 % (ref 11.7–15.4)
WBC: 8.9 10*3/uL (ref 3.4–10.8)

## 2021-11-18 LAB — TSH: TSH: 1.62 u[IU]/mL (ref 0.450–4.500)

## 2021-11-18 LAB — LIPID PANEL
Chol/HDL Ratio: 3.1 ratio (ref 0.0–4.4)
Cholesterol, Total: 201 mg/dL — ABNORMAL HIGH (ref 100–199)
HDL: 65 mg/dL (ref >39)
LDL Chol Calc (NIH): 123 mg/dL — ABNORMAL HIGH (ref 0–99)
Triglycerides: 73 mg/dL (ref 0–149)
VLDL Cholesterol Cal: 13 mg/dL (ref 5–40)

## 2021-11-30 ENCOUNTER — Ambulatory Visit: Payer: Medicare Other

## 2021-12-01 ENCOUNTER — Ambulatory Visit (INDEPENDENT_AMBULATORY_CARE_PROVIDER_SITE_OTHER): Payer: Medicare Other | Admitting: Family Medicine

## 2021-12-01 ENCOUNTER — Encounter: Payer: Self-pay | Admitting: Family Medicine

## 2021-12-01 ENCOUNTER — Other Ambulatory Visit: Payer: Self-pay

## 2021-12-01 VITALS — BP 136/74 | HR 67 | Temp 98.1°F | Resp 17 | Ht 65.0 in | Wt 174.6 lb

## 2021-12-01 DIAGNOSIS — E663 Overweight: Secondary | ICD-10-CM | POA: Diagnosis not present

## 2021-12-01 DIAGNOSIS — Z23 Encounter for immunization: Secondary | ICD-10-CM

## 2021-12-01 DIAGNOSIS — I480 Paroxysmal atrial fibrillation: Secondary | ICD-10-CM

## 2021-12-01 DIAGNOSIS — E785 Hyperlipidemia, unspecified: Secondary | ICD-10-CM

## 2021-12-01 MED ORDER — VALACYCLOVIR HCL 1 G PO TABS: 1000.0000 mg | ORAL_TABLET | Freq: Every day | ORAL | 5 refills | Status: DC

## 2021-12-01 MED ORDER — MELOXICAM 15 MG PO TABS: 7.5000 mg | ORAL_TABLET | Freq: Every day | ORAL | 0 refills | Status: DC

## 2021-12-01 NOTE — Assessment & Plan Note (Signed)
Chronic problem.  Just recently stopped Crestor at the direction of Dr Debara Pickett.  Is going to start Zetia '10mg'$  the first week of October.  Reviewed recent lipid panel.  Will follow along.

## 2021-12-01 NOTE — Patient Instructions (Signed)
Follow up in 1 year or as needed Your recent labs look great!!! Continue to work on healthy diet and regular exercise- you look great! Call with any questions or concerns Stay Safe!  Stay Healthy! Happy Fall!!!

## 2021-12-01 NOTE — Progress Notes (Signed)
   Subjective:    Patient ID: Lorraine [name], female    DOB: 1950-07-20, 71 y.o.   MRN: [id number]  HPI Hyperlipidemia- chronic problem, on Zetia '10mg'$  daily.  Was previously on Crestor but Dr Debara Pickett stopped this.  Lipid panel 9/5- LDL 123, HDL 65.  No CP, SOB, abd pain, N/V.  Overweight- ongoing issue for pt.  BMI 29.05  Pt does yoga, walking, goes to the gym 3x/week.  Afib- chronic problem, s/p ablation.  Remains on Xarelto '20mg'$  daily.  Saw Dr Debara Pickett earlier this month.  Is asymptomatic s/p Watchman.  Not aware of any recent sxs.  Is stopping Xarelto on 9/29 and will start '81mg'$  ASA.  No abnormal bleeding/bruising   Review of Systems For ROS see HPI     Objective:   Physical Exam Vitals reviewed.  Constitutional:      General: She is not in acute distress.    Appearance: Normal appearance. She is well-developed. She is not ill-appearing.  HENT:     Head: Normocephalic and atraumatic.  Eyes:     Conjunctiva/sclera: Conjunctivae normal.     Pupils: Pupils are equal, round, and reactive to light.  Neck:     Thyroid: No thyromegaly.  Cardiovascular:     Rate and Rhythm: Normal rate and regular rhythm.     Pulses: Normal pulses.     Heart sounds: Normal heart sounds. No murmur heard. Pulmonary:     Effort: Pulmonary effort is normal. No respiratory distress.     Breath sounds: Normal breath sounds.  Abdominal:     General: There is no distension.     Palpations: Abdomen is soft.     Tenderness: There is no abdominal tenderness.  Musculoskeletal:     Cervical back: Normal range of motion and neck supple.     Right lower leg: No edema.     Left lower leg: No edema.  Lymphadenopathy:     Cervical: No cervical adenopathy.  Skin:    General: Skin is warm and dry.  Neurological:     Mental Status: She is alert and oriented to person, place, and time.  Psychiatric:        Behavior: Behavior normal.           Assessment & Plan:

## 2021-12-01 NOTE — Assessment & Plan Note (Signed)
Pt is asymptomatic since her Watchman procedure.  Is very excited to come off Xarelto at the end of the month.  Will start '81mg'$  ASA

## 2021-12-01 NOTE — Assessment & Plan Note (Signed)
Ongoing issue.  Pt is getting back to her regular exercise routine which consists of yoga, walking, and gym 3x/week.  Applauded her efforts.  Reviewed recent labs.  No need to repeat.

## 2021-12-10 ENCOUNTER — Telehealth: Payer: Self-pay | Admitting: Cardiology

## 2021-12-10 NOTE — Telephone Encounter (Signed)
Called the patient to inform her of the next medication change after LAAO with Watchman implant 06/11/21. Left voice message to call back. She can stop her Xarelto and there is no further need for dental SBE at this time.    Kathyrn Drown NP-C Structural Heart Team  Pager: [phone] Phone: [phone]

## 2021-12-11 NOTE — Telephone Encounter (Signed)
Spoke to patient and she is aware of med changes. Changes made in [place].   Angelena Form PA-C  [facility]

## 2021-12-14 DIAGNOSIS — M25561 Pain in right knee: Secondary | ICD-10-CM | POA: Diagnosis not present

## 2021-12-14 DIAGNOSIS — M5416 Radiculopathy, lumbar region: Secondary | ICD-10-CM | POA: Diagnosis not present

## 2021-12-15 ENCOUNTER — Other Ambulatory Visit: Payer: Self-pay | Admitting: Lab

## 2021-12-15 ENCOUNTER — Other Ambulatory Visit: Payer: Self-pay | Admitting: Podiatry

## 2021-12-15 ENCOUNTER — Other Ambulatory Visit: Payer: Self-pay | Admitting: Family Medicine

## 2021-12-15 DIAGNOSIS — M542 Cervicalgia: Secondary | ICD-10-CM

## 2021-12-15 DIAGNOSIS — H35363 Drusen (degenerative) of macula, bilateral: Secondary | ICD-10-CM | POA: Diagnosis not present

## 2021-12-15 MED ORDER — MELOXICAM 15 MG PO TABS: 7.5000 mg | ORAL_TABLET | Freq: Every day | ORAL | 0 refills | Status: DC

## 2021-12-15 NOTE — Telephone Encounter (Signed)
Med list updated

## 2021-12-15 NOTE — Addendum Note (Signed)
Addended by: Harland German A on: 12/15/2021 04:16 PM   Modules accepted: Orders

## 2021-12-16 NOTE — Telephone Encounter (Signed)
Please advise 

## 2021-12-28 ENCOUNTER — Other Ambulatory Visit: Payer: Self-pay

## 2021-12-28 ENCOUNTER — Encounter: Payer: Self-pay | Admitting: Family Medicine

## 2021-12-28 ENCOUNTER — Encounter: Payer: Self-pay | Admitting: Podiatry

## 2021-12-28 DIAGNOSIS — M542 Cervicalgia: Secondary | ICD-10-CM

## 2021-12-28 MED ORDER — MELOXICAM 15 MG PO TABS: 7.5000 mg | ORAL_TABLET | Freq: Every day | ORAL | 1 refills | Status: DC

## 2022-01-14 ENCOUNTER — Encounter: Payer: Self-pay | Admitting: Family Medicine

## 2022-01-14 ENCOUNTER — Encounter: Payer: Self-pay | Admitting: Podiatry

## 2022-01-15 ENCOUNTER — Telehealth: Payer: Self-pay

## 2022-01-15 MED ORDER — ALPRAZOLAM 0.5 MG PO TABS: 0.5000 mg | ORAL_TABLET | Freq: Two times a day (BID) | ORAL | 1 refills | Status: DC | PRN

## 2022-01-15 NOTE — Telephone Encounter (Signed)
Sent via [place] encounter

## 2022-01-15 NOTE — Telephone Encounter (Signed)
Please clarify what pharmacy

## 2022-01-15 NOTE — Telephone Encounter (Signed)
Trying to send prescription but not clear which pharmacy this needs to go to

## 2022-01-15 NOTE — Telephone Encounter (Signed)
Alprazolam 0.5 mg LOV: 11/11/21 Last Refill:11/02/21 Upcoming appt: 12/08/22

## 2022-01-18 MED ORDER — GABAPENTIN 100 MG PO CAPS: ORAL_CAPSULE | ORAL | 0 refills | Status: DC

## 2022-01-18 MED ORDER — GABAPENTIN 300 MG PO CAPS: ORAL_CAPSULE | ORAL | 0 refills | Status: DC

## 2022-01-21 DIAGNOSIS — H35363 Drusen (degenerative) of macula, bilateral: Secondary | ICD-10-CM | POA: Diagnosis not present

## 2022-02-05 ENCOUNTER — Encounter: Payer: Self-pay | Admitting: Family Medicine

## 2022-02-05 ENCOUNTER — Other Ambulatory Visit: Payer: Self-pay

## 2022-02-05 NOTE — Telephone Encounter (Signed)
Zolpidem 10 mg LOV: 12/01/21 Last Refill:10/16/21 Upcoming appt: none  Lorraine [name] [place]

## 2022-02-08 ENCOUNTER — Telehealth: Payer: Self-pay | Admitting: Hematology and Oncology

## 2022-02-08 ENCOUNTER — Encounter: Payer: Self-pay | Admitting: Family Medicine

## 2022-02-08 MED ORDER — ZOLPIDEM TARTRATE 10 MG PO TABS: 10.0000 mg | ORAL_TABLET | Freq: Every evening | ORAL | 1 refills | Status: DC | PRN

## 2022-02-11 ENCOUNTER — Encounter: Payer: Self-pay | Admitting: Family Medicine

## 2022-02-13 ENCOUNTER — Encounter: Payer: Self-pay | Admitting: Internal Medicine

## 2022-02-14 MED ORDER — ZOLPIDEM TARTRATE 10 MG PO TABS: 10.0000 mg | ORAL_TABLET | Freq: Every day | ORAL | 3 refills | Status: DC

## 2022-02-15 ENCOUNTER — Other Ambulatory Visit: Payer: Self-pay | Admitting: Podiatry

## 2022-02-17 ENCOUNTER — Other Ambulatory Visit: Payer: Self-pay | Admitting: Podiatry

## 2022-02-17 ENCOUNTER — Telehealth: Payer: Self-pay

## 2022-02-17 ENCOUNTER — Telehealth: Payer: Self-pay | Admitting: Podiatry

## 2022-02-17 ENCOUNTER — Other Ambulatory Visit (HOSPITAL_COMMUNITY): Payer: Self-pay

## 2022-02-17 MED ORDER — GABAPENTIN 300 MG PO CAPS: ORAL_CAPSULE | ORAL | 0 refills | Status: DC

## 2022-02-17 MED ORDER — GABAPENTIN 100 MG PO CAPS: ORAL_CAPSULE | ORAL | 0 refills | Status: DC

## 2022-02-17 NOTE — Telephone Encounter (Signed)
Patient was informed and understands will just pay out of pocket

## 2022-02-17 NOTE — Telephone Encounter (Signed)
Please let pt know that her prior authorization has been denied and we can do one of the following  1) she can use the [facility] website to find the cheapest retail pharmacy and pay out of pocket.  She just needs to let us know where to send it 2) we switch to the preferred medication (doxepin) and see if that does the job.  If it doesn't, we can say she tried and failed their medication and try again to get the Zolpidem approved

## 2022-02-17 NOTE — Telephone Encounter (Signed)
Called to discuss, no answer, appears [facility] about $20 out of pocket

## 2022-02-17 NOTE — Telephone Encounter (Signed)
Pt called and the pharmacy had sent in a refill request for her gabapentin and it was denied and then she put one in my chart that was denied. She was not sure why and upon looking it has been over a yr since she was seen last so I scheduled her to follow up for the refill.

## 2022-02-17 NOTE — Telephone Encounter (Signed)
Pharmacy Patient Advocate Encounter  Received notification from [facility] that the request for prior authorization for Zolpidem Tartrate 10 mg has been denied due to .

## 2022-02-17 NOTE — Telephone Encounter (Signed)
Patient Advocate Encounter   Received notification from [facility] that prior authorization for Zolpidem Tartrate '10MG'$  is required.   PA submitted on [id number] Key [id number]  Status is pending       Joneen Boers, [place] Patient Advocate Specialist [facility] Patient Advocate Team Direct Number: [phone] Fax: [phone]

## 2022-02-17 NOTE — Telephone Encounter (Signed)
PA denied insurance wants a trial and failure

## 2022-02-25 ENCOUNTER — Ambulatory Visit (INDEPENDENT_AMBULATORY_CARE_PROVIDER_SITE_OTHER): Payer: Medicare Other | Admitting: Podiatry

## 2022-02-25 ENCOUNTER — Encounter: Payer: Self-pay | Admitting: Podiatry

## 2022-02-25 DIAGNOSIS — M778 Other enthesopathies, not elsewhere classified: Secondary | ICD-10-CM | POA: Diagnosis not present

## 2022-02-25 DIAGNOSIS — G5793 Unspecified mononeuropathy of bilateral lower limbs: Secondary | ICD-10-CM

## 2022-02-25 MED ORDER — TRIAMCINOLONE ACETONIDE 40 MG/ML IJ SUSP
40.0000 mg | Freq: Once | INTRAMUSCULAR | Status: AC
  Administered 2022-02-25: 40 mg

## 2022-02-28 NOTE — Progress Notes (Signed)
She presents today for follow-up of her neuropathy bilateral she had to come in for med check.  She denies changes in her past medical history medications allergies surgery social history states that her feet are still killing her as far as the neuropathy goes.  She does not have any problems with a tendinitis or fasciitis at this point.  She is complaining of pain to the first interdigital space proximally.  She states that I do not know if it is from the nerve or neuropathy or what it is but it hurts right here she points to the proximal portion of the first intermetatarsal space.  Objective: Vital signs stable alert oriented x 3.  Pulses are palpable.  Neurologic sensorium is unchanged.  She has significant pain on palpation to the proximal portion of the first interdigital space.  Assessment: Capsulitis neuritis first interdigital space with a history of neuropathy bilateral.  Plan: Discussed etiology pathology and surgical therapies refilled her gabapentin 100 mg #90 was already refilled on December 6 and 300 mg #90 was already refilled on December 6 so weak may go ahead and refill her once those are used up she will notify her pharmacy.  I also injected the first intermetatarsal space bilaterally with 20 mg Kenalog 5 mg Marcaine should this not alleviate her symptoms she will notify us.

## 2022-03-12 ENCOUNTER — Other Ambulatory Visit: Payer: Self-pay | Admitting: Hematology and Oncology

## 2022-03-12 ENCOUNTER — Ambulatory Visit
Admission: RE | Admit: 2022-03-12 | Discharge: 2022-03-12 | Disposition: A | Discharge status: Home / Self Care | Payer: Medicare Other | Source: Ambulatory Visit | Attending: Hematology and Oncology | Admitting: Hematology and Oncology

## 2022-03-12 ENCOUNTER — Ambulatory Visit: Payer: Medicare Other | Admitting: Hematology and Oncology

## 2022-03-12 DIAGNOSIS — R921 Mammographic calcification found on diagnostic imaging of breast: Secondary | ICD-10-CM | POA: Diagnosis not present

## 2022-03-12 DIAGNOSIS — D0512 Intraductal carcinoma in situ of left breast: Secondary | ICD-10-CM

## 2022-03-13 NOTE — Progress Notes (Incomplete)
Patient Care Team: [name], MD as PCP - General (Family Medicine) [name], MD as PCP - Cardiology (Cardiology) Vickie Epley, MD as PCP - Electrophysiology (Cardiology) Royston Sinner, Colin Benton, MD as Consulting Physician (Obstetrics and Gynecology) [name], [place] as Consulting Physician (Podiatry) [name], MD as Consulting Physician (Rheumatology) [name], MD as Attending Physician (Orthopedic Surgery) [name], MD as Consulting Physician (Cardiology)  DIAGNOSIS: No diagnosis found.  SUMMARY OF ONCOLOGIC HISTORY: Oncology History  Malignant neoplasm of lower-inner quadrant of left breast in female, estrogen receptor positive ([place])  07/26/2018 Initial Diagnosis   Screening mammogram detected a 10cm span of calcifications in the left breast, biopsy confirmed intermediate grade DCIS, ER 100%, PR 100%.    07/26/2018 Cancer Staging   Staging form: Breast, AJCC 8th Edition - Clinical: Stage 0 (cTis (DCIS), cN0, cM0, ER+, PR+) - Signed by Gardenia Phlegm, NP on 07/26/2018   03/20/2019 - 10/2019 Anti-estrogen oral therapy   Tamoxifen, '5mg'$  daily     CHIEF COMPLIANT: Follow-up of left breast DCIS on comet clinical trial surveillance   INTERVAL HISTORY: Lorraine [name] is a 71 y.o. with above-mentioned history of left breast DCIS currently on the COMET clinical trial, randomized to the active surveillance arm. She presents to the clinic today for follow-up.    ALLERGIES:  is allergic to amitriptyline, crestor [rosuvastatin], codeine, and nortriptyline.  MEDICATIONS:  Current Outpatient Medications  Medication Sig Dispense Refill   ALPRAZolam (XANAX) 0.5 MG tablet Take 1 tablet (0.5 mg total) by mouth 2 (two) times daily as needed for anxiety. 60 tablet 1   anastrozole (ARIMIDEX) 1 MG tablet Take 1 tablet (1 mg total) by mouth daily. 90 tablet 3   cyanocobalamin (,VITAMIN B-12,) 1000 MCG/ML injection Inject 1 mL (1,000 mcg total)  into the muscle once a week. 30 mL 3   ezetimibe (ZETIA) 10 MG tablet Take 1 tablet (10 mg total) by mouth daily. 90 tablet 3   gabapentin (NEURONTIN) 100 MG capsule TAKE 1 CAPSULE (100 MG TOTAL) BY MOUTH THREE TIMES DAILY. 90 capsule 0   gabapentin (NEURONTIN) 300 MG capsule TAKE 1 CAPSULE BY MOUTH THREE TIMES A DAY 90 capsule 0   meloxicam (MOBIC) 15 MG tablet Take 0.5 tablets (7.5 mg total) by mouth daily. 90 tablet 1   polyvinyl alcohol (LIQUIFILM TEARS) 1.4 % ophthalmic solution Place 1 drop into both eyes as needed for dry eyes.     valACYclovir (VALTREX) 1000 MG tablet Take 1 tablet (1,000 mg total) by mouth daily. 30 tablet 5   zolpidem (AMBIEN) 10 MG tablet Take 1 tablet (10 mg total) by mouth at bedtime. 30 tablet 3   No current facility-administered medications for this visit.    PHYSICAL EXAMINATION: ECOG PERFORMANCE STATUS: {CHL ONC ECOG PS:[id number]}  There were no vitals filed for this visit. There were no vitals filed for this visit.  BREAST:*** No palpable masses or nodules in either right or left breasts. No palpable axillary supraclavicular or infraclavicular adenopathy no breast tenderness or nipple discharge. (exam performed in the presence of a chaperone)  LABORATORY DATA:  I have reviewed the data as listed    Latest Ref Rng & Units 11/17/2021    8:48 AM 07/08/2021    8:54 AM 06/12/2021   12:45 AM  CMP  Glucose 70 - 99 mg/dL 96  93  110   BUN 8 - 27 mg/dL '29  28  28   '$ Creatinine 0.57 -  1.00 mg/dL 0.68  0.61  0.79   Sodium 134 - 144 mmol/L 141  143  140   Potassium 3.5 - 5.2 mmol/L 4.3  4.6  4.1   Chloride 96 - 106 mmol/L 105  108  110   CO2 20 - 29 mmol/L '19  17  23   '$ Calcium 8.7 - 10.3 mg/dL 9.5  9.7  9.1   Total Protein 6.0 - 8.5 g/dL 6.4     Total Bilirubin 0.0 - 1.2 mg/dL 0.5     Alkaline Phos 44 - 121 IU/L 95     AST 0 - 40 IU/L 15     ALT 0 - 32 IU/L 17       Lab Results  Component Value Date   WBC 8.9 11/17/2021   HGB 14.1 11/17/2021   HCT  43.5 11/17/2021   MCV 85 11/17/2021   PLT 278 11/17/2021   NEUTROABS 4.3 11/17/2021    ASSESSMENT & PLAN:  No problem-specific Assessment & Plan notes found for this encounter.    No orders of the defined types were placed in this encounter.  The patient has a good understanding of the overall plan. she agrees with it. she will call with any problems that may develop before the next visit here. Total time spent: 30 mins including face to face time and time spent for planning, charting and co-ordination of care   Lorraine [name], [place] 03/13/22    I Lorraine [name] am acting as a Education administrator for [employer]  ***

## 2022-03-16 NOTE — Assessment & Plan Note (Addendum)
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 COMET clinical trial randomized to active surveillance arm 2. Tamoxifen 20 mg daily started 09/20/2018 stopped 11/01/2018.  Vaginal dryness ([name] treatment 11/16/2018) restarted tamoxifen 5 mg 03/30/2019 discontinued tamoxifen in August 2021. 3.  Starting anastrozole 09/11/2021 (because the mammogram showed increasing calcifications)   Breast cancer surveillance: Bilateral mammogram  09/03/2021: Mild increase in left breast calcifications span 8.9 by 7.3 x 4 cm previously it was 9 x 5.4 x 4.3 cm.  We will check on the clinical trials protocol if there is any change in the management required.  I do not intend to biopsy for the slight change.  Left mammogram 03/12/2022: Stable appearance of left breast calcifications 6.7 x 4.5 x 8.1 cm (previously 8.9 x 4 x 7.4 cm)   Peripheral neuropathy: Unclear etiology.  Takes B12 injections at bariatric office.   I discussed the pros and cons of antiestrogen therapy and we recommended anastrozole.  She is willing to try it for a month and will call us if she tolerates it well so she can take it on a long-term basis.  Anastrozole Toxicities:  Return to clinic in 6 months with mammogram and follow-up

## 2022-03-17 ENCOUNTER — Inpatient Hospital Stay: Payer: Medicare Other | Attending: Hematology and Oncology | Admitting: Hematology and Oncology

## 2022-03-17 ENCOUNTER — Encounter: Payer: Self-pay | Admitting: *Deleted

## 2022-03-17 ENCOUNTER — Other Ambulatory Visit: Payer: Self-pay | Admitting: *Deleted

## 2022-03-17 VITALS — BP 133/71 | HR 55 | Temp 97.8°F | Resp 18 | Ht 65.0 in | Wt 177.4 lb

## 2022-03-17 DIAGNOSIS — M25551 Pain in right hip: Secondary | ICD-10-CM | POA: Diagnosis not present

## 2022-03-17 DIAGNOSIS — Z170 Estrogen receptor positive status [ER+]: Secondary | ICD-10-CM

## 2022-03-17 DIAGNOSIS — M25552 Pain in left hip: Secondary | ICD-10-CM | POA: Diagnosis not present

## 2022-03-17 DIAGNOSIS — Z79811 Long term (current) use of aromatase inhibitors: Secondary | ICD-10-CM | POA: Diagnosis not present

## 2022-03-17 DIAGNOSIS — Z006 Encounter for examination for normal comparison and control in clinical research program: Secondary | ICD-10-CM

## 2022-03-17 DIAGNOSIS — G629 Polyneuropathy, unspecified: Secondary | ICD-10-CM

## 2022-03-17 DIAGNOSIS — C50312 Malignant neoplasm of lower-inner quadrant of left female breast: Secondary | ICD-10-CM

## 2022-03-17 MED ORDER — ANASTROZOLE 1 MG PO TABS: 1.0000 mg | ORAL_TABLET | Freq: Every day | ORAL | 3 refills | Status: DC

## 2022-03-17 MED ORDER — CYANOCOBALAMIN 1000 MCG/ML IJ SOLN: 1000.0000 ug | INTRAMUSCULAR | 3 refills | Status: DC

## 2022-03-17 NOTE — Research (Signed)
AFT - 25: COMPARING AN OPERATION TO MONITORING, WITH OR WITHOUT ENDOCRINE THERAPY (COMET) FOR LOW RISK DCIS: A PHASE III PROSPECTIVE RANDOMIZED TRIAL   Patient arrives today unaccompanied for her 42 month study visit.    PROs: The pt's online questionnaires are not due at this time point.    LABS: No labs were required today.   MEDICATION REVIEW: Patient reviews and verifies the current medication list is correct.   VITAL SIGNS: Vital signs are collected per study protocol.   MD/PROVIDER VISIT: Patient sees Dr Lindi Adie for today's visit.   ADVERSE EVENTS: Patient Lorraine [name] reports AEs as below. Attribution by Dr Lindi Adie.   ADVERSE EVENT LOGPRESTINA [name] [phone]   03/17/2022- Adverse Event Log   Study/Protocol: AFT - 25: COMPARING AN OPERATION TO MONITORING, WITH OR WITHOUT ENDOCRINE THERAPY (COMET) FOR LOW RISK DCIS: A PHASE III PROSPECTIVE RANDOMIZED TRIAL  Cycle: 42 month visit   Event Grade Attribution  Date resolved Comments  Arthralgia 2 Unrelated   Ongoing Baseline comorbidity of arthritis. Unchanged. Pt takes Mobic.  Myalgia 1 Unrelated   Ongoing Pt reports mild leg pain that does not limit her ADLs.   Osteopenia 1 Unrelated   Ongoing "Osteopenia", per patient report. Patient gets bone density scans every 2 years.- next one due 06/2022   Peripheral neuropathy; sensory 2 Unrelated   Ongoing Moderate pain. Pt states that she has had "foot issues for years" that do not limit her ADLs. Pt takes Gabapentin, and vitamin B12   Pt denies any Covid infections during this reporting period.   Pt denies the following Solicited AE's:  Allergic reaction, fever, hot flashes, hypertension, nausea, and fracture.     DISPOSITION: Upon completion off all study requirements, patient was take to the scheduling desk and her next study visit, month 48, was scheduled for 09/21/2022.   The patient was thanked for their time and continued voluntary participation in this study. Patient Lorraine [name] has been provided direct contact information and is encouraged to contact this Nurse for any needs or questions.  Brion Aliment RN, BSN, CCRP Clinical Research Nurse Lead 03/17/2022 10:33 AM

## 2022-03-18 ENCOUNTER — Other Ambulatory Visit: Payer: Self-pay | Admitting: Podiatry

## 2022-04-12 ENCOUNTER — Encounter: Payer: Self-pay | Admitting: Family Medicine

## 2022-04-12 ENCOUNTER — Ambulatory Visit (INDEPENDENT_AMBULATORY_CARE_PROVIDER_SITE_OTHER): Payer: Medicare Other | Admitting: Family Medicine

## 2022-04-12 VITALS — BP 132/80 | HR 55 | Temp 97.9°F | Resp 17 | Ht 65.0 in | Wt 173.4 lb

## 2022-04-12 DIAGNOSIS — J029 Acute pharyngitis, unspecified: Secondary | ICD-10-CM

## 2022-04-12 DIAGNOSIS — R051 Acute cough: Secondary | ICD-10-CM

## 2022-04-12 DIAGNOSIS — R519 Headache, unspecified: Secondary | ICD-10-CM

## 2022-04-12 LAB — POC INFLUENZA A&B (BINAX/QUICKVUE)
Influenza A, POC: NEGATIVE
Influenza B, POC: NEGATIVE

## 2022-04-12 LAB — POC COVID19 BINAXNOW: SARS Coronavirus 2 Ag: NEGATIVE

## 2022-04-12 LAB — POCT RAPID STREP A (OFFICE): Rapid Strep A Screen: NEGATIVE

## 2022-04-12 NOTE — Progress Notes (Signed)
Subjective:  Patient ID: Lorraine [name], female    DOB: 1950/07/05  Age: 72 y.o. MRN: [id number]  CC:  Chief Complaint  Patient presents with   Cough    Pt states she has bilateral ear pain ,headache , x 3 days  Losing voice ,  Coughing dry ,  Nasal congestion  Has taken OTC nasal spray and a decongestant     HPI Lorraine [name] presents for  Cough: Past 3 days - Friday afternoon, Headache, bilateral ear pain - crackling in ears. Some pain below left ear. dry cough, nasal congestion.  Has had some voice loss as well since yesterday, but most of it coming back today.  No fever.  Twinge of sore throat. No chest pain or dyspnea.  Eating and drinking fluids ok.  No confusion. Fatigue. Out of work today.  Sick contacts at work - covid, pneumonia, flu, other URI's.  [employer]   Tx: saline rinse, flonase, mucinex DM, otc cold and flu med - too big of a pill - minimal change.     Had RSV vaccine, and flu vaccine in the fall, no recent covid booster - decided against it. Last covid infection in April 2023.    Immunization History  Administered Date(s) Administered   Fluad Quad(high Dose 65+) 01/07/2021   Influenza, High Dose Seasonal PF 11/12/2017, 12/01/2021   Influenza,inj,Quad PF,6+ Mos 11/14/2015, 11/23/2016, 11/15/2018, 11/24/2019   Influenza-Unspecified 01/10/2021, 01/01/2022   Janssen (J&J) SARS-COV-2 Vaccination 05/14/2019, 02/13/2020   Pneumococcal Conjugate-13 06/14/2015   Pneumococcal Polysaccharide-23 09/23/2016, 11/25/2017   Respiratory Syncytial Virus Vaccine,Recomb Aduvanted(Arexvy) 02/03/2022   Tdap 07/13/2015   Zoster Recombinat (Shingrix) 04/26/2021, 06/29/2021     History Patient Active Problem List   Diagnosis Date Noted   Presence of Watchman left atrial appendage closure device 06/12/2021   Atrial fibrillation ([place]) 06/11/2021   Prolapse of female genital organs 07/09/2020   Candidiasis 12/19/2019   Chronic pain of both shoulders 11/28/2019    Eustachian tube dysfunction, bilateral 11/05/2019   Secondary hypercoagulable state ([place]) 03/08/2019   Malignant neoplasm of lower-inner quadrant of left breast in female, estrogen receptor positive ([place]) 07/26/2018   Anxiety about health 05/31/2018   Paroxysmal atrial fibrillation ([place]) 05/18/2018   Overweight (BMI 25.0-29.9) 95/63/8756   Systolic murmur 43/32/9518   Shingles 11/23/2016   Arthritis 05/26/2016   Insomnia 05/26/2016   Neuropathy 05/26/2016   Positive ANA (antinuclear antibody) 05/26/2016   Hyperlipidemia 05/26/2016   Neck and shoulder pain 01/26/2014   Cervicalgia 01/26/2014   METATARSALGIA 05/06/2010   PLANTAR FASCIITIS, LEFT 05/06/2010   FOOT PAIN, BILATERAL 05/06/2010   Past Medical History:  Diagnosis Date   Anxiety    Aortic atherosclerosis ([place]) 02/03/2018   Noted on CT Abd/Pelvis   AR (aortic regurgitation) 07/18/2017   Mild, noted on ECHO   Bilateral cataracts    Bilateral plantar fasciitis    Chronic knee pain    Chronic low back pain    Chronic lumbar radiculopathy 07/14/2017   Mild, L5, noted on electromyography   History of palpitations    2014-- low risk exercise tolerence test, no ischemi, PVC's (09-06-2012)   HSV-1 (herpes simplex virus 1) infection    Cold sore   Hx of colonic polyps    Hyperlipidemia    Insomnia    Lactose intolerance    Left nephrolithiasis 02/03/2018   Nonobstructing, noted on CT Abd/pelvis   Morton's metatarsalgia, neuralgia, or neuroma, bilateral    Osteoarthritis    knees  and lumbar, feet   Osteopenia    Peripheral neuropathy    bilateral feet -- burning and stinging   PONV (postoperative nausea and vomiting)    severe   Presence of Watchman left atrial appendage closure device 06/11/2021   Watchman 96m FLX with Dr. LQuentin Ore  PVC's (premature ventricular contractions)    Systolic murmur    "Slight"   Vaginal cyst    Wears glasses    Past Surgical History:  Procedure Laterality Date   ANTERIOR  CERVICAL DECOMP/DISCECTOMY FUSION  01/2014   C4 -- C6   ATRIAL FIBRILLATION ABLATION N/A 04/09/2021   Procedure: ATRIAL FIBRILLATION ABLATION;  Surgeon: LVickie Epley MD;  [facility];  Service: Cardiovascular;  Laterality: N/A;   BREAST BIOPSY Left 07/17/2018   x2   CATARACT EXTRACTION W/ INTRAOCULAR LENS  IMPLANT, BILATERAL  2015   COLONOSCOPY  05/14/2015   CYSTO WITH HYDRODISTENSION N/A 04/14/2018   Procedure: CYSTOSCOPY/HYDRODISTENSION WITH INSTILLATION OF PYRIDIUM AND MARCAINE, BLADDER BIOPSY WITH FULGERATION 0.5 TO 2 CM;  Surgeon: EFestus Aloe MD;  [facility]  Service: Urology;  Laterality: N/A;   ELBOW DEBRIDEMENT Right 09/10/2010   and tendon debridement and radial tunnel release   EXCISION MORTON'S NEUROMA Bilateral left 02-20-2009/  right & left 05-08-2009   EXCISION VAGINAL CYST N/A 07/01/2016   Procedure: EXCISION VAGINAL CYST;  Surgeon: ETyson Dense MD;  [facility]  Service: Gynecology;  Laterality: N/A;   LEFT ATRIAL APPENDAGE OCCLUSION N/A 06/11/2021   Procedure: LEFT ATRIAL APPENDAGE OCCLUSION;  Surgeon: LVickie Epley MD;  [facility];  Service: Cardiovascular;  Laterality: N/A;   PLANTAR FASCIA RELEASE Bilateral left 04-02-2010/  right 04-02-2016   TEE WITHOUT CARDIOVERSION N/A 06/11/2021   Procedure: TRANSESOPHAGEAL ECHOCARDIOGRAM (TEE);  Surgeon: LVickie Epley MD;  [facility];  Service: Cardiovascular;  Laterality: N/A;   TONSILLECTOMY AND ADENOIDECTOMY  child   TOTAL ABDOMINAL HYSTERECTOMY  1974   w/  Left salpingoophorectomy and Partial right salpingoophorectomy   Allergies  Allergen Reactions   Amitriptyline Nausea Only    Bladder retention   Crestor [Rosuvastatin] Other (See Comments)    myalgias   Codeine Nausea And Vomiting and Rash   Nortriptyline Rash    Bladder retention    Prior to Admission medications   Medication  Sig Start Date End Date Taking? Authorizing Provider  ALPRAZolam (Duanne Moron 0.5 MG tablet Take 1 tablet (0.5 mg total) by mouth 2 (two) times daily as needed for anxiety. 01/15/22  Yes TMidge Minium MD  anastrozole (ARIMIDEX) 1 MG tablet Take 1 tablet (1 mg total) by mouth daily. 03/17/22  Yes GNicholas Lose MD  cyanocobalamin (VITAMIN B12) 1000 MCG/ML injection Inject 1 mL (1,000 mcg total) into the muscle once a week. 03/17/22  Yes GNicholas Lose MD  ezetimibe (ZETIA) 10 MG tablet Take 1 tablet (10 mg total) by mouth daily. 11/17/21 11/12/22 Yes Hilty, KNadean Corwin MD  gabapentin (NEURONTIN) 300 MG capsule TAKE ONE CAPSULE BY MOUTH THREE TIMES A DAY 03/23/22  Yes Hyatt, Max T, DPM  meloxicam (MOBIC) 15 MG tablet Take 0.5 tablets (7.5 mg total) by mouth daily. 12/28/21  Yes TMidge Minium MD  polyvinyl alcohol (LIQUIFILM TEARS) 1.4 % ophthalmic solution Place 1 drop into both eyes as needed for dry eyes.   Yes [provider]  valACYclovir (VALTREX) 1000 MG tablet Take 1 tablet (1,000 mg total) by mouth daily.  12/01/21  Yes [name], MD  zolpidem (AMBIEN) 10 MG tablet Take 1 tablet (10 mg total) by mouth at bedtime. 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  Has one son.  Works as a Counsellor for [employer].  Education: some college.    Social Determinants of Health   Financial Resource Strain: Not on file  Food Insecurity: Not on file  Transportation Needs: Not on file  Physical Activity: Not on file  Stress: Not on file  Social Connections: Not on file  Intimate Partner Violence: Not on file    Review of Systems Per HPI.   Objective:   Vitals:   04/12/22 1127  BP: 132/80  Pulse: (!) 55  Resp: 17  Temp: 97.9 F (36.6 C)  TempSrc: Oral  SpO2: 98%  Weight: 173 lb 6 oz (78.6 kg)  Height: '5\' 5"'$  (1.651 m)     Physical Exam Vitals reviewed.  Constitutional:      General: She is not in acute distress.    Appearance: She is well-developed.  HENT:     Head: Normocephalic and atraumatic.     Right Ear: Hearing, tympanic membrane, ear canal and external ear normal.     Left Ear: Hearing, tympanic membrane, ear canal and external ear normal.     Ears:     Comments: Pinna nontender, no pain with traction bilaterally.  Some discomfort just below her lobe on the left towards the cervical nodes without apparent enlarged nodes.    Nose: Nose normal.     Mouth/Throat:     Pharynx: Posterior oropharyngeal erythema (minimal posterior oropharynx without exudate.  No cobblestoning.) present.  Eyes:     Conjunctiva/sclera: Conjunctivae normal.     Pupils: Pupils are equal, round, and reactive to light.  Cardiovascular:     Rate and Rhythm: Normal rate and regular rhythm.     Heart sounds: Normal heart sounds. No murmur heard. Pulmonary:     Effort: Pulmonary effort is normal. No respiratory distress.     Breath sounds: Normal breath sounds. No stridor. No wheezing or rhonchi.  Skin:    General: Skin is warm and dry.     Findings: No rash.  Neurological:     Mental Status: She is alert and oriented to person, place, and time.  Psychiatric:        Mood and Affect: Mood  normal.        Behavior: Behavior normal.    Results for orders placed or performed in visit on 04/12/22  POC Influenza A&B(BINAX/QUICKVUE)  Result Value Ref Range   Influenza A, POC Negative Negative   Influenza B, POC Negative Negative  POC COVID-19 BinaxNow  Result Value Ref Range   SARS Coronavirus 2 Ag Negative Negative  POCT rapid strep A  Result Value Ref Range   Rapid Strep A Screen Negative Negative    Assessment & Plan:  Lorraine [name] is a 72 y.o. female . Acute cough - Plan: POC Influenza A&B(BINAX/QUICKVUE), POC COVID-19 BinaxNow  Sore throat - Plan: POC Influenza A&B(BINAX/QUICKVUE), POC COVID-19 BinaxNow, POCT rapid strep A, CANCELED: POC COVID-19, CANCELED: POCT Influenza A/B, CANCELED: POCT rapid strep A  Nonintractable episodic headache, unspecified headache type - Plan: POC Influenza A&B(BINAX/QUICKVUE), POC COVID-19 BinaxNow  Suspected viral illness.  Reassuring exam and vital signs, negative flu, COVID, strep testing as above.  Laryngitis/decreased voice yesterday has improved some today.  Continue symptomatic care discussed with RTC precautions.  Mucinex, Tylenol, fluids, saline nasal spray.  Discussed Tessalon but has taken before and did not notice improvement.  No prescription meds for now.  No orders of the defined types were placed in this encounter.  Patient Instructions  Sorry that you are sick.  Your testing in office was negative/normal. You likely have other virus at this time. Continue to drink plenty of fluids, voice rest is much as possible.  I expect your voice to continue to improve, especially if you are able to provide it some rest.  Mucinex is fine for cough, Tylenol as needed for headache.  Saline nasal spray is fine for nasal congestion but the Mucinex should also be helpful.  I expect symptoms to be improving this week, not worsening.  If fevers, shortness of breath or chest pain be seen.  Take care!  Upper Respiratory Infection, Adult An  upper respiratory infection (URI) is a common viral infection of the nose, throat, and upper air passages that lead to the lungs. The most common type of URI is the common cold. URIs usually get better on their own, without medical treatment. What are the causes? A URI is caused by a virus. You may catch a virus by: Breathing in droplets from an infected person's cough or sneeze. Touching something that has been exposed to the virus (is contaminated) and then touching your mouth, nose, or eyes. What increases the risk? You are more likely to get a URI if: You are very young or very old. You have close contact with others, such as at work, school, or a health care facility. You smoke. You have long-term (chronic) heart or lung disease. You have a weakened disease-fighting system (immune system). You have nasal allergies or asthma. You are experiencing a lot of stress. You have poor nutrition. What are the signs or symptoms? A URI usually involves some of the following symptoms: Runny or stuffy (congested) nose. Cough. Sneezing. Sore throat. Headache. Fatigue. Fever. Loss of appetite. Pain in your forehead, behind your eyes, and over your cheekbones (sinus pain). Muscle aches. Redness or irritation of the eyes. Pressure in the ears or face. How is this diagnosed? This condition may be diagnosed based on your medical history and symptoms, and a physical exam. Your health care provider may use a swab to take a mucus sample from your nose (nasal swab). This sample can be tested to determine what virus is causing the illness. How is this treated? URIs usually get better on their own within 7-10 days. Medicines cannot cure URIs, but your health care provider may recommend certain medicines to  help relieve symptoms, such as: Over-the-counter cold medicines. Cough suppressants. Coughing is a type of defense against infection that helps to clear the respiratory system, so take these medicines  only as recommended by your health care provider. Fever-reducing medicines. Follow these instructions at home: Activity Rest as needed. If you have a fever, stay home from work or school until your fever is gone or until your health care provider says your URI cannot spread to other people (is no longer contagious). Your health care provider may have you wear a face mask to prevent your infection from spreading. Relieving symptoms Gargle with a mixture of salt and water 3-4 times a day or as needed. To make salt water, completely dissolve -1 tsp (3-6 g) of salt in 1 cup (237 mL) of warm water. Use a cool-mist humidifier to add moisture to the air. This can help you breathe more easily. Eating and drinking  Drink enough fluid to keep your urine pale yellow. Eat soups and other clear broths. General instructions  Take over-the-counter and prescription medicines only as told by your health care provider. These include cold medicines, fever reducers, and cough suppressants. Do not use any products that contain nicotine or tobacco. These products include cigarettes, chewing tobacco, and vaping devices, such as e-cigarettes. If you need help quitting, ask your health care provider. Stay away from secondhand smoke. Stay up to date on all immunizations, including the yearly (annual) flu vaccine. Keep all follow-up visits. This is important. How to prevent the spread of infection to others URIs can be contagious. To prevent the infection from spreading: Wash your hands with soap and water for at least 20 seconds. If soap and water are not available, use hand sanitizer. Avoid touching your mouth, face, eyes, or nose. Cough or sneeze into a tissue or your sleeve or elbow instead of into your hand or into the air.  Contact a health care provider if: You are getting worse instead of better. You have a fever or chills. Your mucus is brown or red. You have yellow or brown discharge coming from your  nose. You have pain in your face, especially when you bend forward. You have swollen neck glands. You have pain while swallowing. You have white areas in the back of your throat. Get help right away if: You have shortness of breath that gets worse. You have severe or persistent: Headache. Ear pain. Sinus pain. Chest pain. You have chronic lung disease along with any of the following: Making high-pitched whistling sounds when you breathe, most often when you breathe out (wheezing). Prolonged cough (more than 14 days). Coughing up blood. A change in your usual mucus. You have a stiff neck. You have changes in your: Vision. Hearing. Thinking. Mood. These symptoms may be an emergency. Get help right away. Call 911. Do not wait to see if the symptoms will go away. Do not drive yourself to the hospital. Summary An upper respiratory infection (URI) is a common infection of the nose, throat, and upper air passages that lead to the lungs. A URI is caused by a virus. URIs usually get better on their own within 7-10 days. Medicines cannot cure URIs, but your health care provider may recommend certain medicines to help relieve symptoms. This information is not intended to replace advice given to you by your health care provider. Make sure you discuss any questions you have with your health care provider. Document Revised: 10/01/2020 Document Reviewed: 10/01/2020 Elsevier Patient Education  [place].  Signed,   Merri Ray, MD [place] 04/12/22 12:39 PM

## 2022-04-12 NOTE — Patient Instructions (Addendum)
Sorry that you are sick.  Your testing in office was negative/normal. You likely have other virus at this time. Continue to drink plenty of fluids, voice rest is much as possible.  I expect your voice to continue to improve, especially if you are able to provide it some rest.  Mucinex is fine for cough, Tylenol as needed for headache.  Saline nasal spray is fine for nasal congestion but the Mucinex should also be helpful.  I expect symptoms to be improving this week, not worsening.  If fevers, shortness of breath or chest pain be seen.  Take care!  Upper Respiratory Infection, Adult An upper respiratory infection (URI) is a common viral infection of the nose, throat, and upper air passages that lead to the lungs. The most common type of URI is the common cold. URIs usually get better on their own, without medical treatment. What are the causes? A URI is caused by a virus. You may catch a virus by: Breathing in droplets from an infected person's cough or sneeze. Touching something that has been exposed to the virus (is contaminated) and then touching your mouth, nose, or eyes. What increases the risk? You are more likely to get a URI if: You are very young or very old. You have close contact with others, such as at work, school, or a health care facility. You smoke. You have long-term (chronic) heart or lung disease. You have a weakened disease-fighting system (immune system). You have nasal allergies or asthma. You are experiencing a lot of stress. You have poor nutrition. What are the signs or symptoms? A URI usually involves some of the following symptoms: Runny or stuffy (congested) nose. Cough. Sneezing. Sore throat. Headache. Fatigue. Fever. Loss of appetite. Pain in your forehead, behind your eyes, and over your cheekbones (sinus pain). Muscle aches. Redness or irritation of the eyes. Pressure in the ears or face. How is this diagnosed? This condition may be diagnosed based on  your medical history and symptoms, and a physical exam. Your health care provider may use a swab to take a mucus sample from your nose (nasal swab). This sample can be tested to determine what virus is causing the illness. How is this treated? URIs usually get better on their own within 7-10 days. Medicines cannot cure URIs, but your health care provider may recommend certain medicines to help relieve symptoms, such as: Over-the-counter cold medicines. Cough suppressants. Coughing is a type of defense against infection that helps to clear the respiratory system, so take these medicines only as recommended by your health care provider. Fever-reducing medicines. Follow these instructions at home: Activity Rest as needed. If you have a fever, stay home from work or school until your fever is gone or until your health care provider says your URI cannot spread to other people (is no longer contagious). Your health care provider may have you wear a face mask to prevent your infection from spreading. Relieving symptoms Gargle with a mixture of salt and water 3-4 times a day or as needed. To make salt water, completely dissolve -1 tsp (3-6 g) of salt in 1 cup (237 mL) of warm water. Use a cool-mist humidifier to add moisture to the air. This can help you breathe more easily. Eating and drinking  Drink enough fluid to keep your urine pale yellow. Eat soups and other clear broths. General instructions  Take over-the-counter and prescription medicines only as told by your health care provider. These include cold medicines, fever reducers, and  cough suppressants. Do not use any products that contain nicotine or tobacco. These products include cigarettes, chewing tobacco, and vaping devices, such as e-cigarettes. If you need help quitting, ask your health care provider. Stay away from secondhand smoke. Stay up to date on all immunizations, including the yearly (annual) flu vaccine. Keep all follow-up  visits. This is important. How to prevent the spread of infection to others URIs can be contagious. To prevent the infection from spreading: Wash your hands with soap and water for at least 20 seconds. If soap and water are not available, use hand sanitizer. Avoid touching your mouth, face, eyes, or nose. Cough or sneeze into a tissue or your sleeve or elbow instead of into your hand or into the air.  Contact a health care provider if: You are getting worse instead of better. You have a fever or chills. Your mucus is brown or red. You have yellow or brown discharge coming from your nose. You have pain in your face, especially when you bend forward. You have swollen neck glands. You have pain while swallowing. You have white areas in the back of your throat. Get help right away if: You have shortness of breath that gets worse. You have severe or persistent: Headache. Ear pain. Sinus pain. Chest pain. You have chronic lung disease along with any of the following: Making high-pitched whistling sounds when you breathe, most often when you breathe out (wheezing). Prolonged cough (more than 14 days). Coughing up blood. A change in your usual mucus. You have a stiff neck. You have changes in your: Vision. Hearing. Thinking. Mood. These symptoms may be an emergency. Get help right away. Call 911. Do not wait to see if the symptoms will go away. Do not drive yourself to the hospital. Summary An upper respiratory infection (URI) is a common infection of the nose, throat, and upper air passages that lead to the lungs. A URI is caused by a virus. URIs usually get better on their own within 7-10 days. Medicines cannot cure URIs, but your health care provider may recommend certain medicines to help relieve symptoms. This information is not intended to replace advice given to you by your health care provider. Make sure you discuss any questions you have with your health care  provider. Document Revised: 10/01/2020 Document Reviewed: 10/01/2020 Elsevier Patient Education  [place].

## 2022-04-21 ENCOUNTER — Encounter: Payer: Self-pay | Admitting: Family Medicine

## 2022-04-26 ENCOUNTER — Other Ambulatory Visit: Payer: Self-pay

## 2022-04-26 MED ORDER — ALPRAZOLAM 0.5 MG PO TABS: 0.5000 mg | ORAL_TABLET | Freq: Two times a day (BID) | ORAL | 1 refills | Status: DC | PRN

## 2022-04-26 NOTE — Telephone Encounter (Signed)
Left VM stating Rx has been sent in

## 2022-04-26 NOTE — Telephone Encounter (Signed)
XANAX 0.5 MG LOV: 04/12/22 Last Refill:01/15/22 Upcoming appt: 12/08/22

## 2022-04-27 ENCOUNTER — Encounter: Payer: Self-pay | Admitting: Family Medicine

## 2022-04-27 NOTE — Telephone Encounter (Signed)
Pt is calling in wanting to confirm that her alprazolam will be sent in to the [facility] on [address] and that she is no longer uses the [facility] that it was sent to.  Pt asked that it be removed from her chart.  Pt is also wanting to have a letter written for her job she stated that she has sent a message in as well but want to make sure that someone please give her a call back.

## 2022-04-28 MED ORDER — ALPRAZOLAM 0.5 MG PO TABS: 0.5000 mg | ORAL_TABLET | Freq: Two times a day (BID) | ORAL | 1 refills | Status: DC | PRN

## 2022-04-28 NOTE — Telephone Encounter (Signed)
Message has been sent to Dr Birdie Riddle and I notified the pt that she will respond soon and I will call her back

## 2022-05-13 ENCOUNTER — Ambulatory Visit (INDEPENDENT_AMBULATORY_CARE_PROVIDER_SITE_OTHER): Payer: Medicare Other

## 2022-05-13 DIAGNOSIS — E785 Hyperlipidemia, unspecified: Secondary | ICD-10-CM | POA: Diagnosis not present

## 2022-05-13 DIAGNOSIS — Q231 Congenital insufficiency of aortic valve: Secondary | ICD-10-CM

## 2022-05-13 LAB — ECHOCARDIOGRAM COMPLETE
Area-P 1/2: 3.03 cm2
P 1/2 time: 675 msec
S' Lateral: 2.89 cm

## 2022-05-14 LAB — LIPID PANEL
Chol/HDL Ratio: 3.6 ratio (ref 0.0–4.4)
Cholesterol, Total: 205 mg/dL — ABNORMAL HIGH (ref 100–199)
HDL: 57 mg/dL (ref >39)
LDL Chol Calc (NIH): 135 mg/dL — ABNORMAL HIGH (ref 0–99)
Triglycerides: 70 mg/dL (ref 0–149)
VLDL Cholesterol Cal: 13 mg/dL (ref 5–40)

## 2022-05-26 ENCOUNTER — Encounter: Payer: Self-pay | Admitting: Family Medicine

## 2022-05-26 ENCOUNTER — Ambulatory Visit (INDEPENDENT_AMBULATORY_CARE_PROVIDER_SITE_OTHER): Payer: Medicare Other | Admitting: Family Medicine

## 2022-05-26 VITALS — BP 118/80 | HR 75 | Temp 98.8°F | Resp 17 | Ht 65.0 in | Wt 177.0 lb

## 2022-05-26 DIAGNOSIS — B9689 Other specified bacterial agents as the cause of diseases classified elsewhere: Secondary | ICD-10-CM | POA: Diagnosis not present

## 2022-05-26 DIAGNOSIS — J329 Chronic sinusitis, unspecified: Secondary | ICD-10-CM | POA: Diagnosis not present

## 2022-05-26 MED ORDER — AMOXICILLIN 875 MG PO TABS: 875.0000 mg | ORAL_TABLET | Freq: Two times a day (BID) | ORAL | 0 refills | Status: AC

## 2022-05-26 NOTE — Progress Notes (Signed)
   Subjective:    Patient ID: Lorraine [name], female    DOB: 16-Apr-1950, 72 y.o.   MRN: [id number]  HPI 'i think I've got a head something going on here'- pt reports the cough from the January illness never really resolved.  Sunday cough worsened.  Then developed head congestion, frontal HA, + sinus pressure.  + PND.  Currently on Zyrtec, Flonase, Saline rinse.  No fever.  No body aches.   Review of Systems For ROS see HPI     Objective:   Physical Exam Vitals reviewed.  Constitutional:      General: She is not in acute distress.    Appearance: Normal appearance. She is well-developed. She is ill-appearing.  HENT:     Head: Normocephalic and atraumatic.     Right Ear: Tympanic membrane normal.     Left Ear: Tympanic membrane normal.     Nose: Mucosal edema, congestion and rhinorrhea present.     Right Sinus: Maxillary sinus tenderness and frontal sinus tenderness present.     Left Sinus: Maxillary sinus tenderness and frontal sinus tenderness present.     Mouth/Throat:     Pharynx: Uvula midline. Posterior oropharyngeal erythema present. No oropharyngeal exudate.  Eyes:     Conjunctiva/sclera: Conjunctivae normal.     Pupils: Pupils are equal, round, and reactive to light.  Cardiovascular:     Rate and Rhythm: Normal rate and regular rhythm.     Heart sounds: Normal heart sounds.  Pulmonary:     Effort: Pulmonary effort is normal. No respiratory distress.     Breath sounds: Normal breath sounds. No wheezing.  Musculoskeletal:     Cervical back: Normal range of motion and neck supple.  Lymphadenopathy:     Cervical: No cervical adenopathy.  Skin:    General: Skin is warm and dry.  Neurological:     General: No focal deficit present.     Mental Status: She is alert and oriented to person, place, and time.     Cranial Nerves: No cranial nerve deficit.     Motor: No weakness.     Coordination: Coordination normal.  Psychiatric:        Mood and Affect: Mood normal.         Behavior: Behavior normal.        Thought Content: Thought content normal.           Assessment & Plan:  Bacterial sinusitis- pt's sxs and PE consistent w/ dx.  Start Amoxicillin twice daily.  Reviewed supportive care and red flags that should prompt return.  Pt expressed understanding and is in agreement w/ plan.

## 2022-05-26 NOTE — Patient Instructions (Signed)
Follow up as needed or as scheduled START the Amoxicillin twice daily- take w/ food Drink LOTS of fluids REST! Robitussin, Mucinex DM, or Delsym for cough Call with any questions or concerns Hang in there!!!

## 2022-05-26 NOTE — Addendum Note (Signed)
Addended by: [name] on: 05/26/2022 10:59 AM   Modules accepted: Orders

## 2022-06-02 ENCOUNTER — Other Ambulatory Visit: Payer: Self-pay

## 2022-06-02 DIAGNOSIS — Z79899 Other long term (current) drug therapy: Secondary | ICD-10-CM

## 2022-06-05 ENCOUNTER — Encounter: Payer: Self-pay | Admitting: Family Medicine

## 2022-06-06 ENCOUNTER — Other Ambulatory Visit: Payer: Self-pay | Admitting: Podiatry

## 2022-06-07 NOTE — Telephone Encounter (Signed)
Ambien 10 mg LOV: 04/12/22 Last Refill:02/14/22 Upcoming appt: none  Pt is a Dr Birdie Riddle pt could you refill in her absence ? Thank  you

## 2022-06-15 ENCOUNTER — Encounter: Payer: Self-pay | Admitting: Family Medicine

## 2022-06-15 ENCOUNTER — Ambulatory Visit (INDEPENDENT_AMBULATORY_CARE_PROVIDER_SITE_OTHER): Payer: Medicare Other | Admitting: Family Medicine

## 2022-06-15 VITALS — BP 134/78 | HR 68 | Temp 98.9°F | Ht 65.0 in | Wt 178.0 lb

## 2022-06-15 DIAGNOSIS — B370 Candidal stomatitis: Secondary | ICD-10-CM

## 2022-06-15 MED ORDER — FLUCONAZOLE 150 MG PO TABS: 150.0000 mg | ORAL_TABLET | Freq: Every day | ORAL | 0 refills | Status: DC

## 2022-06-15 NOTE — Patient Instructions (Addendum)
-  Prescribed Fluconazole 150mg  1 tablet once a day for 7 days for oral thrush.  -Follow up if not improved or with dentist.

## 2022-06-15 NOTE — Progress Notes (Signed)
Acute Office Visit   Subjective:  Patient ID: Lorraine [name], female    DOB: September 19, 1950, 72 y.o.   MRN: [id number]  Chief Complaint  Patient presents with   Lip Laceration    Pt is here today with C/O of tongue sensitive for 1 yr.  Pt reports she has noticed white on her tongue the past month.  Pt reports she had sensitive teeth and changed to  sensodyne toothpaste and this was making her mouth numb. Pt reports she has stinging around her tongue.     HPI Patient is a 72 year old caucasian female that complains of tongue sensitivity.  She reports about 11 months ago she reports she had sensitive teeth and recommend to use Sensodyne from her dentist. At that time, she didn't have sensitivity to her tongue. She reports with starting Sensodyne toothpaste, she noticed numbing in her mouth, including her tongue. She reports she had covid sometime around this time also, but lost some of her taste. She reports she developed tongue sensitivity. Over the year, her tongue sensitivity has become worse. She reports the edges and tip of her tongue is more sensitive than the rest of her tongue. She has had to eat more bland foods, stop using mouthwash, and started back using Crest toothpaste since November. Denies any sensitivity in her teeth now.  She reports she has a regular dentist cleaning at the end of the month.  Also, reports she has noticed a white film over her tongue gradually, but noticed within the last week it has become worse.   Denies fever, cold sore, or sore throat.    ROS See HPI above     Objective:   BP 134/78   Pulse 68   Temp 98.9 F (37.2 C)   Ht 5\' 5"  (1.651 m)   Wt 178 lb (80.7 kg)   SpO2 96%   BMI 29.62 kg/m    Physical Exam Vitals reviewed.  Constitutional:      General: She is not in acute distress.    Appearance: Normal appearance. She is not ill-appearing, toxic-appearing or diaphoretic.  HENT:     Mouth/Throat:     Mouth: Mucous membranes are moist.      Comments: Mild white covering over tongue that has definitive line close to the out edge of tongue.  Eyes:     General:        Right eye: No discharge.        Left eye: No discharge.     Conjunctiva/sclera: Conjunctivae normal.  Cardiovascular:     Rate and Rhythm: Normal rate.  Pulmonary:     Effort: Pulmonary effort is normal. No respiratory distress.  Musculoskeletal:        General: Normal range of motion.  Skin:    General: Skin is warm and dry.  Neurological:     General: No focal deficit present.     Mental Status: She is alert and oriented to person, place, and time. Mental status is at baseline.  Psychiatric:        Mood and Affect: Mood normal.        Behavior: Behavior normal.        Thought Content: Thought content normal.        Judgment: Judgment normal.      Assessment & Plan:  Oral thrush -     Fluconazole; Take 1 tablet (150 mg total) by mouth daily for 7 days.  Dispense: 7 tablet; Refill: 0  -  Prescribed Fluconazole 150mg  1 tablet once a day for 7 days for oral thrush.  -Follow up if not improved or with dentist.  -Patient expressed understanding of care plan and agreeable to treatment.   Valarie Merino, NP

## 2022-06-18 ENCOUNTER — Encounter: Payer: Self-pay | Admitting: Family Medicine

## 2022-06-18 DIAGNOSIS — M5416 Radiculopathy, lumbar region: Secondary | ICD-10-CM | POA: Diagnosis not present

## 2022-06-18 MED ORDER — ZOLPIDEM TARTRATE 10 MG PO TABS: 10.0000 mg | ORAL_TABLET | Freq: Every day | ORAL | 3 refills | Status: DC

## 2022-06-20 ENCOUNTER — Encounter: Payer: Self-pay | Admitting: Family Medicine

## 2022-06-20 DIAGNOSIS — B370 Candidal stomatitis: Secondary | ICD-10-CM

## 2022-06-21 MED ORDER — FLUCONAZOLE 150 MG PO TABS
150.0000 mg | ORAL_TABLET | Freq: Every day | ORAL | 0 refills | Status: AC
Start: 2022-06-21 — End: 2022-06-28

## 2022-06-21 NOTE — Telephone Encounter (Addendum)
Pt advised she is improving but notes she is nearly out of fluconazole and is not resolved, wants to know if she should complete another round to clear given length of infection or how to proceed?  Please advise

## 2022-06-23 ENCOUNTER — Ambulatory Visit: Payer: Medicare Other | Admitting: Internal Medicine

## 2022-06-24 ENCOUNTER — Encounter: Payer: Self-pay | Admitting: Podiatry

## 2022-06-24 ENCOUNTER — Ambulatory Visit (INDEPENDENT_AMBULATORY_CARE_PROVIDER_SITE_OTHER): Payer: Medicare Other | Admitting: Podiatry

## 2022-06-24 DIAGNOSIS — M7751 Other enthesopathy of right foot: Secondary | ICD-10-CM | POA: Diagnosis not present

## 2022-06-24 DIAGNOSIS — G5793 Unspecified mononeuropathy of bilateral lower limbs: Secondary | ICD-10-CM

## 2022-06-24 DIAGNOSIS — D2371 Other benign neoplasm of skin of right lower limb, including hip: Secondary | ICD-10-CM

## 2022-06-24 MED ORDER — DEXAMETHASONE SODIUM PHOSPHATE 120 MG/30ML IJ SOLN
2.0000 mg | Freq: Once | INTRAMUSCULAR | Status: AC
Start: 2022-06-24 — End: 2022-06-24
  Administered 2022-06-24: 2 mg via INTRA_ARTICULAR

## 2022-06-24 MED ORDER — GABAPENTIN 100 MG PO CAPS: 100.0000 mg | ORAL_CAPSULE | Freq: Every day | ORAL | 3 refills | Status: DC

## 2022-06-24 NOTE — Progress Notes (Signed)
She presents today for follow-up of her neuropathy and neuritis she is also complaining of painful lesion beneath the fifth metatarsal head of the right foot thinks that her new [employer] tennis shoes may have caused this.  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  Objective: Vital signs stable tellurian x 3 pulses are palpable.  Neurologic sensorium is diminished to deep sensation however she has allodynic type symptomatology.  She also demonstrates a painful fluctuant lesion beneath the fifth metatarsal head of the right foot with overlying hyperkeratotic tissue.  No open lesions or wounds are noted.  Assessment: Bursitis subfifth met head right with overlying benign skin lesion.  Bilateral idiopathic neuropathy.  Plan: She will continue with her current doses of gabapentin daily.  I will refer her to Dr. Janey Genta for evaluation for spinal stimulator.  Also injected a small amount of dexamethasone 4 mg beneath the hyperkeratotic lesion into the bursa.  Also debrided the reactive hyperkeratotic lesion to follow-up with her in 3 months.

## 2022-06-28 ENCOUNTER — Encounter: Payer: Self-pay | Admitting: Family Medicine

## 2022-06-28 ENCOUNTER — Ambulatory Visit (INDEPENDENT_AMBULATORY_CARE_PROVIDER_SITE_OTHER): Payer: Medicare Other | Admitting: Family Medicine

## 2022-06-28 VITALS — BP 128/80 | HR 59 | Temp 98.0°F | Resp 16 | Ht 65.0 in | Wt 178.4 lb

## 2022-06-28 DIAGNOSIS — K146 Glossodynia: Secondary | ICD-10-CM | POA: Diagnosis not present

## 2022-06-28 LAB — B12 AND FOLATE PANEL
Folate: 10.3 ng/mL (ref >5.9)
Vitamin B-12: >1500 pg/mL — ABNORMAL HIGH (ref 211–911)

## 2022-06-28 NOTE — Patient Instructions (Signed)
Follow up as needed or as scheduled We'll notify you of your lab results and make any changes if needed Make sure you keep your mouth moist- sip on water, use Biotene, etc Call with any questions or concerns HAPPY BIRTHDAY!!!!

## 2022-06-28 NOTE — Progress Notes (Signed)
   Subjective:    Patient ID: Lorraine [name], female    DOB: 01-30-1951, 72 y.o.   MRN: [id number]  HPI Lorraine [name]- pt was seen on 4/2 and started on Diflucan on 4/8.  Pt reports she had mouth sensitivity starting starting after COVID ~1 yr ago.  Since then, has had 'tingling on my tongue'.  Pt says she has had a white tongue and had a red rim.  This was tx'd w/ oral Fluconazole.  Pt reports she is better but it's not gone.  Pt is taking B12 shots q2 weeks.   Review of Systems For ROS see HPI     Objective:   Physical Exam Vitals reviewed.  Constitutional:      General: She is not in acute distress.    Appearance: Normal appearance. She is not ill-appearing.  HENT:     Head: Normocephalic and atraumatic.     Mouth/Throat:     Lips: Pink.     Mouth: Mucous membranes are moist. No oral lesions.     Dentition: No gingival swelling, dental abscesses or gum lesions.     Tongue: No lesions.     Palate: No lesions.  Musculoskeletal:     Cervical back: Neck supple.  Lymphadenopathy:     Cervical: No cervical adenopathy.  Skin:    General: Skin is warm and dry.  Neurological:     General: No focal deficit present.     Mental Status: She is alert and oriented to person, place, and time.  Psychiatric:        Mood and Affect: Mood normal.        Behavior: Behavior normal.        Thought Content: Thought content normal.           Assessment & Plan:   Burning tongue- new.  Pt was seen earlier this month and treated for possible thrush.  She says sxs have improved but not resolved.  No obvious lesions on tongue or mouth, no white coating seen.  Will check labs to r/o metabolic cause for burning sensation but discussed that there is a neurologic syndrome that can cause a persistent burning sensation of mouth or tongue.  If labs are all normal, will discuss starting treatment for possible burning tongue/mouth syndrome.  Pt expressed understanding and is in agreement w/ plan.

## 2022-06-29 LAB — IRON,TIBC AND FERRITIN PANEL
%SAT: 22 % (calc) (ref 16–45)
Ferritin: 109 ng/mL (ref 16–288)

## 2022-07-03 LAB — IRON,TIBC AND FERRITIN PANEL
Iron: 93 ug/dL (ref 45–160)
TIBC: 424 mcg/dL (calc) (ref 250–450)

## 2022-07-03 LAB — ZINC: Zinc: 83 ug/dL (ref 60–130)

## 2022-07-04 ENCOUNTER — Other Ambulatory Visit: Payer: Self-pay | Admitting: Podiatry

## 2022-07-04 ENCOUNTER — Encounter: Payer: Self-pay | Admitting: Family Medicine

## 2022-07-05 ENCOUNTER — Telehealth: Payer: Self-pay

## 2022-07-05 NOTE — Telephone Encounter (Signed)
-----   Message from Sheliah Hatch, MD sent at 07/05/2022  7:11 AM EDT ----- Labs look good!  No obvious cause for burning tongue.  Let me know how you are feeling

## 2022-07-05 NOTE — Telephone Encounter (Signed)
Pt aware of lab results . Pt states she it is getting better as  long as she uses the Biotin .

## 2022-07-05 NOTE — Telephone Encounter (Signed)
Patient believes B12 was up due to having a shot just before blood draw, also notes she doesn't want to start any new meds at this time

## 2022-07-13 DIAGNOSIS — M7071 Other bursitis of hip, right hip: Secondary | ICD-10-CM | POA: Diagnosis not present

## 2022-07-13 DIAGNOSIS — M7072 Other bursitis of hip, left hip: Secondary | ICD-10-CM | POA: Diagnosis not present

## 2022-07-13 DIAGNOSIS — M545 Low back pain, unspecified: Secondary | ICD-10-CM | POA: Diagnosis not present

## 2022-07-21 DIAGNOSIS — M48062 Spinal stenosis, lumbar region with neurogenic claudication: Secondary | ICD-10-CM | POA: Diagnosis not present

## 2022-07-21 DIAGNOSIS — M792 Neuralgia and neuritis, unspecified: Secondary | ICD-10-CM | POA: Diagnosis not present

## 2022-07-21 NOTE — Therapy (Signed)
OUTPATIENT PHYSICAL THERAPY LOWER EXTREMITY EVALUATION   Patient Name: Lorraine [name] MRN: [id number] DOB:Jul 17, 1950, 72 y.o., female Today's Date: 07/22/2022  END OF SESSION:  PT End of Session - 07/22/22 0752     Visit Number 1    Date for PT Re-Evaluation 09/16/22    Authorization Type Medicare    PT Start Time 0758    PT Stop Time 0838    PT Time Calculation (min) 40 min    Activity Tolerance Patient tolerated treatment well             Past Medical History:  Diagnosis Date   Anxiety    Aortic atherosclerosis (HCC) 02/03/2018   Noted on CT Abd/Pelvis   AR (aortic regurgitation) 07/18/2017   Mild, noted on ECHO   Bilateral cataracts    Bilateral plantar fasciitis    Chronic knee pain    Chronic low back pain    Chronic lumbar radiculopathy 07/14/2017   Mild, L5, noted on electromyography   History of palpitations    2014-- low risk exercise tolerence test, no ischemi, PVC's (09-06-2012)   HSV-1 (herpes simplex virus 1) infection    Cold sore   Hx of colonic polyps    Hyperlipidemia    Insomnia    Lactose intolerance    Left nephrolithiasis 02/03/2018   Nonobstructing, noted on CT Abd/pelvis   Morton's metatarsalgia, neuralgia, or neuroma, bilateral    Osteoarthritis    knees and lumbar, feet   Osteopenia    Peripheral neuropathy    bilateral feet -- burning and stinging   PONV (postoperative nausea and vomiting)    severe   Presence of Watchman left atrial appendage closure device 06/11/2021   Watchman 27mm FLX with Dr. Lalla Brothers   PVC's (premature ventricular contractions)    Systolic murmur    "Slight"   Vaginal cyst    Wears glasses    Past Surgical History:  Procedure Laterality Date   ANTERIOR CERVICAL DECOMP/DISCECTOMY FUSION  01/2014   C4 -- C6   ATRIAL FIBRILLATION ABLATION N/A 04/09/2021   Procedure: ATRIAL FIBRILLATION ABLATION;  Surgeon: Lanier Prude, MD;  Location: [facility];  Service: Cardiovascular;  Laterality: N/A;    BREAST BIOPSY Left 07/17/2018   x2   CATARACT EXTRACTION W/ INTRAOCULAR LENS  IMPLANT, BILATERAL  2015   COLONOSCOPY  05/14/2015   CYSTO WITH HYDRODISTENSION N/A 04/14/2018   Procedure: CYSTOSCOPY/HYDRODISTENSION WITH INSTILLATION OF PYRIDIUM AND MARCAINE, BLADDER BIOPSY WITH FULGERATION 0.5 TO 2 CM;  Surgeon: Jerilee Field, MD;  Location: [facility];  Service: Urology;  Laterality: N/A;   ELBOW DEBRIDEMENT Right 09/10/2010   and tendon debridement and radial tunnel release   EXCISION MORTON'S NEUROMA Bilateral left 02-20-2009/  right & left 05-08-2009   EXCISION VAGINAL CYST N/A 07/01/2016   Procedure: EXCISION VAGINAL CYST;  Surgeon: Ranae Pila, MD;  Location[facility];  Service: Gynecology;  Laterality: N/A;   LEFT ATRIAL APPENDAGE OCCLUSION N/A 06/11/2021   Procedure: LEFT ATRIAL APPENDAGE OCCLUSION;  Surgeon: Lanier Prude, MD;  Location: [facility];  Service: Cardiovascular;  Laterality: N/A;   PLANTAR FASCIA RELEASE Bilateral left 04-02-2010/  right 04-02-2016   TEE WITHOUT CARDIOVERSION N/A 06/11/2021   Procedure: TRANSESOPHAGEAL ECHOCARDIOGRAM (TEE);  Surgeon: Lanier Prude, MD;  Location: [facility];  Service: Cardiovascular;  Laterality: N/A;   TONSILLECTOMY AND ADENOIDECTOMY  child   TOTAL ABDOMINAL HYSTERECTOMY  1974   w/  Left salpingoophorectomy  and Partial right salpingoophorectomy   Patient Active Problem List   Diagnosis Date Noted   Presence of Watchman left atrial appendage closure device 06/12/2021   Atrial fibrillation (HCC) 06/11/2021   Prolapse of female genital organs 07/09/2020   Candidiasis 12/19/2019   Chronic pain of both shoulders 11/28/2019   Eustachian tube dysfunction, bilateral 11/05/2019   Secondary hypercoagulable state (HCC) 03/08/2019   Malignant neoplasm of lower-inner quadrant of left breast in female, estrogen receptor positive (HCC) 07/26/2018   Anxiety about health  05/31/2018   Paroxysmal atrial fibrillation (HCC) 05/18/2018   Overweight (BMI 25.0-29.9) 05/18/2018   Systolic murmur 07/14/2017   Shingles 11/23/2016   Arthritis 05/26/2016   Insomnia 05/26/2016   Neuropathy 05/26/2016   Positive ANA (antinuclear antibody) 05/26/2016   Hyperlipidemia 05/26/2016   Neck and shoulder pain 01/26/2014   Cervicalgia 01/26/2014   METATARSALGIA 05/06/2010   PLANTAR FASCIITIS, LEFT 05/06/2010   FOOT PAIN, BILATERAL 05/06/2010    PCP: Neena Rhymes MD  REFERRING PROVIDER: Romero Belling MD  REFERRING DIAG: bil glute medius tendonopathy, history of glute tear and right ischial bursa pain  THERAPY DIAG:  Hip pain; weakness Rationale for Evaluation and Treatment: Rehabilitation  ONSET DATE: 03/15/22  SUBJECTIVE:   SUBJECTIVE STATEMENT: Several injections in bursa on left over the years, fewer injections on right.  Just finished a Round of prednisone for 6 days helped but the left hip pain has returned.  Tore right glute from yardwork and fell twice years ago and took months to heal.  1 month ago got on recumbent bike at work for 27 min and strained right glute again (ischial area); I don't feel as stable   Does chair yoga; does water exercise on her own at [employer] on the treadmill  PERTINENT HISTORY: Resolved A-fib; newly referred to pain clinic for spinal cord stimulator to address peripheral neuropathy in feet;  Chronic lumbar radiculopathy; peripheral neuropathy in feet; cervical fusion Bil knee OA Lots of PT over the year "I didn't get anything out of it"  PAIN:  PAIN:  Are you having pain? Yes NPRS scale: 1-2/10 left; 3/10 on right Pain location: right ischial tuberosity; left lateral hip trochanteric bursa Aggravating factors: bending down;  sudden movement; sidelying; sitting in the car Relieving factors: walking does not ; movement PRECAUTIONS: None  WEIGHT BEARING RESTRICTIONS: No  FALLS:  Has patient fallen in last 6  months? No   OCCUPATION: working 12 hour shifts  PLOF: Independent  PATIENT GOALS: I told the doctor I didn't need to come but I wanted to do the exercises properly and try the DN (mostly here for the DN)   OBJECTIVE:     PATIENT SURVEYS:  FOTO 65%  COGNITION: Overall cognitive status: Within functional limits for tasks assessed      MUSCLE LENGTH: Hamstrings: Right 70 deg; Left 75 deg Decreased hip flexor length: extension to 5 degrees bil  POSTURE: pelvic drop with attempted SLS <3 sec right/left  PALPATION: Tender points left lateral glutes; tender points right proximal HS at ischial tuberosity; tender bil ITB  LOWER EXTREMITY ROM: Bil knee flexion 125 degrees LOWER EXTREMITY MMT:  MMT Right eval Left eval  Hip flexion 4 4  Hip extension 4- 4-  Hip abduction 3- 3-  Hip adduction    Hip internal rotation    Hip external rotation 4- 4-  Knee flexion 4- 4  Knee extension 4 4  Ankle dorsiflexion    Ankle plantarflexion    Ankle inversion  Ankle eversion     (Blank rows = not tested)  TRUNK STRENGTH:  Decreased activation of transverse abdominus muscles; abdominals 4-/5; decreased activation of lumbar multifidi; trunk extensors 4-/5  FUNCTIONAL TESTS:  5x sit to stand no hands 14.02 sec  GAIT:  Comments: pelvic drop, lateral trunk lean   TODAY'S TREATMENT:                                                                                                                              DATE: 5/8    PATIENT EDUCATION:  Education details: Educated patient on anatomy and physiology of current symptoms, prognosis, plan of care as well as initial self care strategies to promote recovery  Person educated: Patient Education method: Explanation Education comprehension: verbalized understanding  HOME EXERCISE PROGRAM: Access Code: [email or web address] URL: [email or web address] Date: 07/22/2022 Prepared by: Lavinia Sharps  Exercises - Clamshell  - 1 x  daily - 7 x weekly - 1 sets - 10 reps - Seated Hip Abduction with Resistance  - 1 x daily - 7 x weekly - 1 sets - 10 reps - Hip Flexor Stretch on Step  - 1 x daily - 7 x weekly - 1 sets - 10 reps  ASSESSMENT:  CLINICAL IMPRESSION: Patient is a 72 y.o. female who was seen today for physical therapy evaluation and treatment for bilateral gluteal tendonopathy, history of right glute tear and ischial bursa pain.  The patient demonstrates strength deficits bilaterally particularly glute medius muscle with obvious pelvic drop noted in standing and during gait.  Decreased muscle length noted as well in hip flexors. Tender points in right proximal HS and left lateral gluteals. These deficits and pain cause functional impairments with standing, walking, going up and down curbs and steps at home and in the community.     OBJECTIVE IMPAIRMENTS: decreased activity tolerance, decreased strength, increased fascial restrictions, impaired perceived functional ability, increased muscle spasms, and pain.   ACTIVITY LIMITATIONS: lifting, bending, sitting, squatting, sleeping, stairs, dressing, hygiene/grooming, and locomotion level  PARTICIPATION LIMITATIONS: meal prep, cleaning, laundry, driving, shopping, community activity, and occupation  PERSONAL FACTORS: Past/current experiences, Time since onset of injury/illness/exacerbation, and 3+ comorbidities: multi joint OA, peripheral neuropathy in both feet, chronic back pain  are also affecting patient's functional outcome.   REHAB POTENTIAL: Good  CLINICAL DECISION MAKING: moderate EVALUATION COMPLEXITY: moderate   GOALS: Goals reviewed with patient? Yes  SHORT TERM GOALS: Target date: 08/19/2022   The patient will demonstrate knowledge of basic self care strategies and exercises to promote healing   Baseline: Goal status: INITIAL  2.  The patient will report a 30% improvement in pain levels with functional activities which are currently difficult  including bending, sidelying, sitting in the car Baseline:  Goal status: INITIAL  3.  The patient will have improved hip strength to at least 4-/5 needed for standing, walking longer distances and descending stairs at home and in the community  Baseline:  Goal status:  INITIAL  4.  Improved LE strength with 5x sit to stand time improved to <13 sec Baseline:  Goal status: INITIAL   LONG TERM GOALS: Target date: 09/16/2022    The patient will be independent in a safe self progression of a home exercise program to promote further recovery of function   Baseline:  Goal status: INITIAL  2.  The patient will report a 60% improvement in pain levels with functional activities which are currently difficult including bending, sidelying,sitting in the car Baseline:  Goal status: INITIAL  3.  The patient will have improved hip strength to at least 4/5 needed for standing, walking longer distances and descending stairs at home and in the community  Baseline:  Goal status: INITIAL    4.  The patient will have improved FOTO score to   68%     indicating improved function with less pain  Baseline:  Goal status: INITIAL     PLAN:  PT FREQUENCY: 1x/week  PT DURATION: 8 weeks  PLANNED INTERVENTIONS: Therapeutic exercises, Therapeutic activity, Neuromuscular re-education, Balance training, Gait training, Patient/Family education, Self Care, Joint mobilization, Aquatic Therapy, Dry Needling, Cryotherapy, Moist heat, Taping, Ultrasound, Ionotophoresis 4mg /ml Dexamethasone, Manual therapy, and Re-evaluation  PLAN FOR NEXT SESSION: DN left gluteals, DN right proximal HS; review initial HEP;  low level gluteal and core ex's  Lavinia Sharps, PT 07/22/22 5:22 PM Phone: [phone] Fax: [phone]

## 2022-07-22 ENCOUNTER — Other Ambulatory Visit: Payer: Self-pay

## 2022-07-22 ENCOUNTER — Encounter: Payer: Self-pay | Admitting: Physical Therapy

## 2022-07-22 ENCOUNTER — Ambulatory Visit: Payer: Medicare Other | Attending: Physical Medicine and Rehabilitation | Admitting: Physical Therapy

## 2022-07-22 DIAGNOSIS — M25552 Pain in left hip: Secondary | ICD-10-CM | POA: Insufficient documentation

## 2022-07-22 DIAGNOSIS — M6281 Muscle weakness (generalized): Secondary | ICD-10-CM | POA: Diagnosis not present

## 2022-07-22 DIAGNOSIS — M25551 Pain in right hip: Secondary | ICD-10-CM | POA: Diagnosis not present

## 2022-07-26 DIAGNOSIS — F411 Generalized anxiety disorder: Secondary | ICD-10-CM | POA: Diagnosis not present

## 2022-07-27 ENCOUNTER — Encounter (HOSPITAL_COMMUNITY): Payer: Self-pay

## 2022-07-27 ENCOUNTER — Ambulatory Visit: Payer: Medicare Other | Attending: Internal Medicine | Admitting: Internal Medicine

## 2022-07-27 ENCOUNTER — Telehealth: Payer: Self-pay | Admitting: *Deleted

## 2022-07-27 ENCOUNTER — Encounter: Payer: Self-pay | Admitting: Internal Medicine

## 2022-07-27 VITALS — BP 124/74 | HR 59 | Ht 65.0 in | Wt 178.0 lb

## 2022-07-27 DIAGNOSIS — I4891 Unspecified atrial fibrillation: Secondary | ICD-10-CM | POA: Insufficient documentation

## 2022-07-27 DIAGNOSIS — Q231 Congenital insufficiency of aortic valve: Secondary | ICD-10-CM | POA: Diagnosis not present

## 2022-07-27 DIAGNOSIS — I351 Nonrheumatic aortic (valve) insufficiency: Secondary | ICD-10-CM | POA: Insufficient documentation

## 2022-07-27 DIAGNOSIS — E785 Hyperlipidemia, unspecified: Secondary | ICD-10-CM | POA: Diagnosis not present

## 2022-07-27 MED ORDER — ROSUVASTATIN CALCIUM 5 MG PO TABS: 5.0000 mg | ORAL_TABLET | Freq: Every day | ORAL | 3 refills | Status: DC

## 2022-07-27 NOTE — Telephone Encounter (Signed)
  Patient Consent for Virtual Visit        Lorraine [name] has provided verbal consent on 07/27/2022 for a virtual visit (video or telephone).   CONSENT FOR VIRTUAL VISIT FOR:  Lorraine [name]  By participating in this virtual visit I agree to the following:  I hereby voluntarily request, consent and authorize [facility] and its employed or contracted physicians, physician assistants, nurse practitioners or other licensed health care professionals (the Practitioner), to provide me with telemedicine health care services (the "Services") as deemed necessary by the treating Practitioner. I acknowledge and consent to receive the Services by the Practitioner via telemedicine. I understand that the telemedicine visit will involve communicating with the Practitioner through live audiovisual communication technology and the disclosure of certain medical information by electronic transmission. I acknowledge that I have been given the opportunity to request an in-person assessment or other available alternative prior to the telemedicine visit and am voluntarily participating in the telemedicine visit.  I understand that I have the right to withhold or withdraw my consent to the use of telemedicine in the course of my care at any time, without affecting my right to future care or treatment, and that the Practitioner or I may terminate the telemedicine visit at any time. I understand that I have the right to inspect all information obtained and/or recorded in the course of the telemedicine visit and may receive copies of available information for a reasonable fee.  I understand that some of the potential risks of receiving the Services via telemedicine include:  Delay or interruption in medical evaluation due to technological equipment failure or disruption; Information transmitted may not be sufficient (e.g. poor resolution of images) to allow for appropriate medical decision making by the  Practitioner; and/or  In rare instances, security protocols could fail, causing a breach of personal health information.  Furthermore, I acknowledge that it is my responsibility to provide information about my medical history, conditions and care that is complete and accurate to the best of my ability. I acknowledge that Practitioner's advice, recommendations, and/or decision may be based on factors not within their control, such as incomplete or inaccurate data provided by me or distortions of diagnostic images or specimens that may result from electronic transmissions. I understand that the practice of medicine is not an exact science and that Practitioner makes no warranties or guarantees regarding treatment outcomes. I acknowledge that a copy of this consent can be made available to me via my patient portal [facility] MyChart), or I can request a printed copy by calling the office of [facility].    I understand that my insurance will be billed for this visit.   I have read or had this consent read to me. I understand the contents of this consent, which adequately explains the benefits and risks of the Services being provided via telemedicine.  I have been provided ample opportunity to ask questions regarding this consent and the Services and have had my questions answered to my satisfaction. I give my informed consent for the services to be provided through the use of telemedicine in my medical care

## 2022-07-27 NOTE — Patient Instructions (Addendum)
Medication Instructions:  START rosuvastatin (Crestor) 5 mg daily-this has been sent to your pharmacy  *If you need a refill on your cardiac medications before your next appointment, please call your pharmacy*  Lab Work: Please return for FASTING labs in 3-4 months (Lipid)  Our in office lab hours are Monday-Friday 8:00-4:00, closed for lunch 1-2 pm.  No appointment needed. --you may also have this completed with your primary care provider at September appointment  [facility] locations:   [facility] - [address]  - [address] ([facility]) - [address] [phone] [address]   [place] - [address]    [place]  - [address]    [place] - [address] - [address] [employer]  - [address] [place] [address] Chief Technology Officer)  Testing/Procedures: Your physician has requested that you have an echocardiogram in FEBRUARY 2025. Echocardiography is a painless test that uses sound waves to create images of your heart. It provides your doctor with information about the size and shape of your heart and how well your heart's chambers and valves are working. This procedure takes approximately one hour. There are no restrictions for this procedure. Please do NOT wear cologne, perfume, aftershave, or lotions (deodorant is allowed). Please arrive 15 minutes prior to your appointment time.  Follow-Up: At [facility], you and your health needs are our priority.  As part of our continuing mission to provide you with exceptional heart care, we have created designated Provider Care Teams.  These Care Teams include your primary Cardiologist (physician) and Advanced Practice Providers (APPs -  Physician Assistants and Nurse Practitioners) who all work together to provide you with the care you need, when you need it.  We recommend signing up for  the patient portal called "MyChart".  Sign up information is provided on this After Visit Summary.  MyChart is used to connect with patients for Virtual Visits (Telemedicine).  Patients are able to view lab/test results, encounter notes, upcoming appointments, etc.  Non-urgent messages can be sent to your provider as well.   To learn more about what you can do with MyChart, go to [email or web address].    Your next appointment:   12 month(s)  Provider:   Chrystie Nose, MD

## 2022-07-27 NOTE — Progress Notes (Signed)
Virtual Visit via Video Note   This visit type was conducted due to national recommendations for restrictions regarding the COVID-19 Pandemic (e.g. social distancing) in an effort to limit this patient's exposure and mitigate transmission in our community.  Due to her co-morbid illnesses, this patient is at least at moderate risk for complications without adequate follow up.  This format is felt to be most appropriate for this patient at this time.  All issues noted in this document were discussed and addressed.  A limited physical exam was performed with this format.  Please refer to the patient's chart for her consent to telehealth for [facility].    Date:  07/27/2022   ID:  Lorraine [name], DOB 06-18-1950, MRN [id number] The patient was identified using 2 identifiers.  Evaluation Performed:  Follow-Up Visit  Patient Location:  [address] [place] [address]  Provider location:   [address] [place], [place] [address]  PCP:  Sheliah Hatch, MD  Cardiologist:  Chrystie Nose, MD Electrophysiologist:  Lanier Prude, MD   Chief Complaint:  Follow-up  History of Present Illness:    Lorraine [name] is a 72 y.o. female who presents via audio/video conferencing for a telehealth visit today.  Ms. Lorraine [name] returns today for follow-up.  She was referred to electrophysiology for evaluation and management of atrial fibrillation.  After discussion with Dr. Elberta Fortis, she underwent successful catheter ablation in January.  Subsequently though she has had concerns about long-term anticoagulation.  She has concerns about bleeding risk and felt that she would like to come off of anticoagulation however knows that this is not a guarantee of being free of stroke despite the fact that she is now had an ablation.  Based on that, she is interested in the [place] procedure.  She is here to discuss that further today.   07/27/2022  Lorraine [name] for follow-up.  She is doing  very well now a year out from A-fib ablation and left atrial appendage closure.  She is off of Xarelto on low-dose aspirin.  She denies any recurrent A-fib.  She has no shortness of breath or chest pain.  When I last saw her we were trying to titrate her lipid medications.  She was having some side effects from her statin and that was discontinued and she started on Zetia.  Labs in February showed total cholesterol 205, triglycerides 70, HDL 57 and LDL 135.  Her goal LDL is less than 70 ideally although it may be challenging to reach that target.  She had a repeat echo which now shows moderate aortic insufficiency.  The valve was thought to be trileaflet although previously noted to be bicuspid and calcified.  Her aortic root was also previously noted to be dilated but now is not read as that.  I personally reviewed the echo images, colluding a TEE procedure performed during the Watchman, which does demonstrate a bicuspid aortic valve with mild to moderate AI.    Prior CV studies:   The following studies were reviewed today:  Chart reviewed  PMHx:  Past Medical History:  Diagnosis Date   Anxiety    Aortic atherosclerosis (HCC) 02/03/2018   Noted on CT Abd/Pelvis   AR (aortic regurgitation) 07/18/2017   Mild, noted on ECHO   Bilateral cataracts    Bilateral plantar fasciitis    Chronic knee pain    Chronic low back pain    Chronic lumbar radiculopathy 07/14/2017   Mild, L5, noted  on electromyography   History of palpitations    2014-- low risk exercise tolerence test, no ischemi, PVC's (09-06-2012)   HSV-1 (herpes simplex virus 1) infection    Cold sore   Hx of colonic polyps    Hyperlipidemia    Insomnia    Lactose intolerance    Left nephrolithiasis 02/03/2018   Nonobstructing, noted on CT Abd/pelvis   Morton's metatarsalgia, neuralgia, or neuroma, bilateral    Osteoarthritis    knees and lumbar, feet   Osteopenia    Peripheral neuropathy    bilateral feet -- burning and stinging    PONV (postoperative nausea and vomiting)    severe   Presence of Watchman left atrial appendage closure device 06/11/2021   Watchman 27mm FLX with Dr. Lalla Brothers   PVC's (premature ventricular contractions)    Systolic murmur    "Slight"   Vaginal cyst    Wears glasses     Past Surgical History:  Procedure Laterality Date   ANTERIOR CERVICAL DECOMP/DISCECTOMY FUSION  01/2014   C4 -- C6   ATRIAL FIBRILLATION ABLATION N/A 04/09/2021   Procedure: ATRIAL FIBRILLATION ABLATION;  Surgeon: Lanier Prude, MD;  Location: [facility] INVASIVE CV LAB;  Service: Cardiovascular;  Laterality: N/A;   BREAST BIOPSY Left 07/17/2018   x2   CATARACT EXTRACTION W/ INTRAOCULAR LENS  IMPLANT, BILATERAL  2015   COLONOSCOPY  05/14/2015   CYSTO WITH HYDRODISTENSION N/A 04/14/2018   Procedure: CYSTOSCOPY/HYDRODISTENSION WITH INSTILLATION OF PYRIDIUM AND MARCAINE, BLADDER BIOPSY WITH FULGERATION 0.5 TO 2 CM;  Surgeon: Jerilee Field, MD;  Location: [facility];  Service: Urology;  Laterality: N/A;   ELBOW DEBRIDEMENT Right 09/10/2010   and tendon debridement and radial tunnel release   EXCISION MORTON'S NEUROMA Bilateral left 02-20-2009/  right & left 05-08-2009   EXCISION VAGINAL CYST N/A 07/01/2016   Procedure: EXCISION VAGINAL CYST;  Surgeon: Ranae Pila, MD;  Location: [facility];  Service: Gynecology;  Laterality: N/A;   LEFT ATRIAL APPENDAGE OCCLUSION N/A 06/11/2021   Procedure: LEFT ATRIAL APPENDAGE OCCLUSION;  Surgeon: Lanier Prude, MD;  Location: [facility] INVASIVE CV LAB;  Service: Cardiovascular;  Laterality: N/A;   PLANTAR FASCIA RELEASE Bilateral left 04-02-2010/  right 04-02-2016   TEE WITHOUT CARDIOVERSION N/A 06/11/2021   Procedure: TRANSESOPHAGEAL ECHOCARDIOGRAM (TEE);  Surgeon: Lanier Prude, MD;  [facility] CV LAB;  Service: Cardiovascular;  Laterality: N/A;   TONSILLECTOMY AND ADENOIDECTOMY  child   TOTAL ABDOMINAL HYSTERECTOMY  1974    w/  Left salpingoophorectomy and Partial right salpingoophorectomy    FAMHx:  Family History  Problem Relation Age of Onset   Lung cancer Mother        lung   Heart attack Father    Memory loss Father    Cancer Paternal Grandfather        colon   Multiple sclerosis Brother     SOCHx:   reports that she has never smoked. She has never used smokeless tobacco. She reports current alcohol use. She reports that she does not use drugs.  ALLERGIES:  Allergies  Allergen Reactions   Amitriptyline Nausea Only    Bladder retention   Pregabalin    Tamoxifen    Codeine Nausea And Vomiting and Rash   Nortriptyline Rash    Bladder retention     MEDS:  Current Meds  Medication Sig   ALPRAZolam (XANAX) 0.5 MG tablet Take 1 tablet (0.5 mg total) by mouth 2 (two) times daily as needed  for anxiety.   anastrozole (ARIMIDEX) 1 MG tablet Take 1 tablet (1 mg total) by mouth daily.   cyanocobalamin (VITAMIN B12) 1000 MCG/ML injection Inject 1 mL (1,000 mcg total) into the muscle once a week.   ezetimibe (ZETIA) 10 MG tablet Take 1 tablet (10 mg total) by mouth daily.   gabapentin (NEURONTIN) 100 MG capsule TAKE ONE CAPSULE BY MOUTH THREE TIMES A DAY (Patient taking differently: as needed. TAKE ONE CAPSULE BY MOUTH THREE TIMES A DAY)   gabapentin (NEURONTIN) 300 MG capsule TAKE 1 CAPSULE BY MOUTH THREE TIMES A DAY   meloxicam (MOBIC) 15 MG tablet Take 0.5 tablets (7.5 mg total) by mouth daily.   polyvinyl alcohol (LIQUIFILM TEARS) 1.4 % ophthalmic solution Place 1 drop into both eyes as needed for dry eyes.   valACYclovir (VALTREX) 1000 MG tablet Take 1 tablet (1,000 mg total) by mouth daily.   zolpidem (AMBIEN) 10 MG tablet Take 1 tablet (10 mg total) by mouth at bedtime.     ROS: Pertinent items noted in HPI and remainder of comprehensive ROS otherwise negative.  Labs/Other Tests and Data Reviewed:    Recent Labs: 11/17/2021: ALT 17; BUN 29; Creatinine, Ser 0.68; Hemoglobin 14.1; Platelets  278; Potassium 4.3; Sodium 141; TSH 1.620   Recent Lipid Panel Lab Results  Component Value Date/Time   CHOL 205 (H) 05/13/2022 08:34 AM   TRIG 70 05/13/2022 08:34 AM   HDL 57 05/13/2022 08:34 AM   CHOLHDL 3.6 05/13/2022 08:34 AM   CHOLHDL 4 06/02/2021 11:01 AM   LDLCALC 135 (H) 05/13/2022 08:34 AM    Wt Readings from Last 3 Encounters:  07/27/22 178 lb (80.7 kg)  06/28/22 178 lb 6 oz (80.9 kg)  06/15/22 178 lb (80.7 kg)     Exam:    Vital Signs:  BP 124/74   Pulse (!) 59   Ht 5\' 5"  (1.651 m)   Wt 178 lb (80.7 kg)   BMI 29.62 kg/m    General appearance: alert and no distress Lungs: no respiratory difficulty Abdomen: normal weight Extremities: extremities normal, atraumatic, no cyanosis or edema Skin: Skin color, texture, turgor normal. No rashes or lesions Neurologic: Grossly normal Psych: Pleasant  ASSESSMENT & PLAN:    PAF - s/p catheter ablation (03/2021) and LAAO (05/2021) CHA2DS2-VASc score 3 -on aspirin, status post left atrial appendage closure CAC score of 35.4, 59th percentile for age and sex matched controls (03/2021) Dyslipidemia, goal LDL less than 70 Frequent PVC's Aortic insufficiency Bicuspid aortic valve-dilated aortic root  Ms. Lorraine [name] seems to be doing well and is asymptomatic.  She denies any recurrent A-fib.  She is on low-dose aspirin.  Her LDL is still above target.  I would advise adding low-dose rosuvastatin back to her ezetimibe if tolerated.  If she has issues tolerating this, we may need to consider PCSK9 inhibitor as she was noted to have coronary artery calcification which was age advanced by CT in 2023.  Her aortic insufficiency appears to be moderate now.  Will need to watch this closely and plan a repeat echo next year, specially given a bicuspid valve.  Follow-up with me afterwards.  Patient Risk:   After full review of this patients clinical status, I feel that they are at least moderate risk at this time.  Time:   Today, I have spent 25  minutes with the patient with telehealth technology discussing stroke risk, the Watchman procedure, etc.     Medication Adjustments/Labs and Tests Ordered: Current medicines are  reviewed at length with the patient today.  Concerns regarding medicines are outlined above.   Tests Ordered: No orders of the defined types were placed in this encounter.   Medication Changes: No orders of the defined types were placed in this encounter.   Disposition:  in 1 year(s)  Chrystie Nose, MD, [facility], FACP  [place]  [facility]  Medical Director of the Advanced Lipid Disorders &  Cardiovascular Risk Reduction Clinic Diplomate of the American Board of Clinical Lipidology Attending Cardiologist  Direct Dial: [phone]  Fax: [phone]  Website:  [email or web address]  Chrystie Nose, MD  07/27/2022 9:03 AM

## 2022-07-29 ENCOUNTER — Ambulatory Visit: Payer: Medicare Other | Admitting: Physical Therapy

## 2022-07-29 DIAGNOSIS — M25552 Pain in left hip: Secondary | ICD-10-CM

## 2022-07-29 DIAGNOSIS — M25551 Pain in right hip: Secondary | ICD-10-CM

## 2022-07-29 DIAGNOSIS — M6281 Muscle weakness (generalized): Secondary | ICD-10-CM | POA: Diagnosis not present

## 2022-07-29 NOTE — Therapy (Addendum)
OUTPATIENT PHYSICAL THERAPY LOWER EXTREMITY PROGRESS NOTE   Patient Name: Lorraine [name] MRN: [id number] DOB:[birth year], 72 y.o., female Today's Date: 08/04/2022  END OF SESSION: Visit #2 Start time: 1100 End time:  1142 42 min     Past Medical History:  Diagnosis Date   Anxiety    Aortic atherosclerosis (HCC) 02/03/2018   Noted on CT Abd/Pelvis   AR (aortic regurgitation) 07/18/2017   Mild, noted on ECHO   Bilateral cataracts    Bilateral plantar fasciitis    Chronic knee pain    Chronic low back pain    Chronic lumbar radiculopathy 07/14/2017   Mild, L5, noted on electromyography   History of palpitations    2014-- low risk exercise tolerence test, no ischemi, PVC's (09-06-2012)   HSV-1 (herpes simplex virus 1) infection    Cold sore   Hx of colonic polyps    Hyperlipidemia    Insomnia    Lactose intolerance    Left nephrolithiasis 02/03/2018   Nonobstructing, noted on CT Abd/pelvis   Morton's metatarsalgia, neuralgia, or neuroma, bilateral    Osteoarthritis    knees and lumbar, feet   Osteopenia    Peripheral neuropathy    bilateral feet -- burning and stinging   PONV (postoperative nausea and vomiting)    severe   Presence of Watchman left atrial appendage closure device 06/11/2021   Watchman 27mm FLX with Dr. Lalla Brothers   PVC's (premature ventricular contractions)    Systolic murmur    "Slight"   Vaginal cyst    Wears glasses    Past Surgical History:  Procedure Laterality Date   ANTERIOR CERVICAL DECOMP/DISCECTOMY FUSION  01/2014   C4 -- C6   ATRIAL FIBRILLATION ABLATION N/A 04/09/2021   Procedure: ATRIAL FIBRILLATION ABLATION;  Surgeon: Lanier Prude, MD;  Location: [facility];  Service: Cardiovascular;  Laterality: N/A;   BREAST BIOPSY Left 07/17/2018   x2   CATARACT EXTRACTION W/ INTRAOCULAR LENS  IMPLANT, BILATERAL  2015   COLONOSCOPY  05/14/2015   CYSTO WITH HYDRODISTENSION N/A 04/14/2018   Procedure: CYSTOSCOPY/HYDRODISTENSION  WITH INSTILLATION OF PYRIDIUM AND MARCAINE, BLADDER BIOPSY WITH FULGERATION 0.5 TO 2 CM;  Surgeon: Jerilee Field, MD;  Location: [facility];  Service: Urology;  Laterality: N/A;   ELBOW DEBRIDEMENT Right 09/10/2010   and tendon debridement and radial tunnel release   EXCISION MORTON'S NEUROMA Bilateral left 02-20-2009/  right & left 05-08-2009   EXCISION VAGINAL CYST N/A 07/01/2016   Procedure: EXCISION VAGINAL CYST;  Surgeon: Ranae Pila, MD;  Location: [facility];  Service: Gynecology;  Laterality: N/A;   LEFT ATRIAL APPENDAGE OCCLUSION N/A 06/11/2021   Procedure: LEFT ATRIAL APPENDAGE OCCLUSION;  Surgeon: Lanier Prude, MD;  Location: [facility];  Service: Cardiovascular;  Laterality: N/A;   PLANTAR FASCIA RELEASE Bilateral left 04-02-2010/  right 04-02-2016   TEE WITHOUT CARDIOVERSION N/A 06/11/2021   Procedure: TRANSESOPHAGEAL ECHOCARDIOGRAM (TEE);  Surgeon: Lanier Prude, MD;  Location: [facility];  Service: Cardiovascular;  Laterality: N/A;   TONSILLECTOMY AND ADENOIDECTOMY  child   TOTAL ABDOMINAL HYSTERECTOMY  1974   w/  Left salpingoophorectomy and Partial right salpingoophorectomy   Patient Active Problem List   Diagnosis Date Noted   Presence of Watchman left atrial appendage closure device 06/12/2021   Atrial fibrillation (HCC) 06/11/2021   Prolapse of female genital organs 07/09/2020   Candidiasis 12/19/2019   Chronic pain of both shoulders 11/28/2019   Eustachian tube dysfunction,  bilateral 11/05/2019   Secondary hypercoagulable state (HCC) 03/08/2019   Malignant neoplasm of lower-inner quadrant of left breast in female, estrogen receptor positive (HCC) 07/26/2018   Anxiety about health 05/31/2018   Paroxysmal atrial fibrillation (HCC) 05/18/2018   Overweight (BMI 25.0-29.9) 05/18/2018   Systolic murmur 07/14/2017   Shingles 11/23/2016   Arthritis 05/26/2016   Insomnia 05/26/2016   Neuropathy  05/26/2016   Positive ANA (antinuclear antibody) 05/26/2016   Hyperlipidemia 05/26/2016   Neck and shoulder pain 01/26/2014   Cervicalgia 01/26/2014   METATARSALGIA 05/06/2010   PLANTAR FASCIITIS, LEFT 05/06/2010   FOOT PAIN, BILATERAL 05/06/2010    PCP: Neena Rhymes MD  REFERRING PROVIDER: Romero Belling MD  REFERRING DIAG: bil glute medius tendonopathy, history of glute tear and right ischial bursa pain  THERAPY DIAG:  Hip pain; weakness Rationale for Evaluation and Treatment: Rehabilitation  ONSET DATE: 03/15/22  SUBJECTIVE:   SUBJECTIVE STATEMENT: Ready to try to DN.  I hope it's not as painful as it was in the feet.  Unsure of the date of spinal cord stimulator procedure.    PERTINENT HISTORY: Several injections in bursa on left over the years, fewer injections on right.  Just finished a Round of prednisone for 6 days helped but the left hip pain has returned.  Tore right glute from yardwork and fell twice years ago and took months to heal.  1 month ago got on recumbent bike at work for 27 min and strained right glute again (ischial area); I don't feel as stable   Does chair yoga; does water exercise on her own at [employer] Walking on the treadmill Resolved A-fib; newly referred to pain clinic for spinal cord stimulator to address peripheral neuropathy in feet;  Chronic lumbar radiculopathy; peripheral neuropathy in feet; cervical fusion Bil knee OA Lots of PT over the year "I didn't get anything out of it"  PAIN:  PAIN:  Are you having pain? Yes NPRS scale: 1-2/10 left; 3/10 on right Pain location: right ischial tuberosity; left lateral hip trochanteric bursa Aggravating factors: bending down;  sudden movement; sidelying; sitting in the car Relieving factors: walking does not ; movement PRECAUTIONS: None  WEIGHT BEARING RESTRICTIONS: No  FALLS:  Has patient fallen in last 6 months? No   OCCUPATION: working 12 hour shifts  PLOF: Independent  PATIENT  GOALS: I told the doctor I didn't need to come but I wanted to do the exercises properly and try the DN (mostly here for the DN)   OBJECTIVE:     PATIENT SURVEYS:  FOTO 65%  COGNITION: Overall cognitive status: Within functional limits for tasks assessed      MUSCLE LENGTH: Hamstrings: Right 70 deg; Left 75 deg Decreased hip flexor length: extension to 5 degrees bil  POSTURE: pelvic drop with attempted SLS <3 sec right/left  PALPATION: Tender points left lateral glutes; tender points right proximal HS at ischial tuberosity; tender bil ITB  LOWER EXTREMITY ROM: Bil knee flexion 125 degrees LOWER EXTREMITY MMT:  MMT Right eval Left eval  Hip flexion 4 4  Hip extension 4- 4-  Hip abduction 3- 3-  Hip adduction    Hip internal rotation    Hip external rotation 4- 4-  Knee flexion 4- 4  Knee extension 4 4  Ankle dorsiflexion    Ankle plantarflexion    Ankle inversion    Ankle eversion     (Blank rows = not tested)  TRUNK STRENGTH:  Decreased activation of transverse abdominus muscles; abdominals 4-/5; decreased  activation of lumbar multifidi; trunk extensors 4-/5  FUNCTIONAL TESTS:  5x sit to stand no hands 14.02 sec  GAIT:  Comments: pelvic drop, lateral trunk lean   TODAY'S TREATMENT:                                                                                                                              DATE: 5/16 Manual therapy: soft tissue mobilization to bil gluteals, piriformis and right proximal HS Trigger Point Dry-Needling  Treatment instructions: Expect mild to moderate muscle soreness. S/S of pneumothorax if dry needled over a lung field, and to seek immediate medical attention should they occur. Patient verbalized understanding of these instructions and education.  Patient Consent Given: Yes Education handout provided: Yes Muscles treated:sidelying bil gluteals, bil piriformis, right prox HS in prone Electrical stimulation performed:  No Parameters: N/A Treatment response/outcome: improved soft tissue mobility Therapeutic ex: emphasis on small dose stretching: HS with strap with bias side to side. Supine piriformis stretch using towel assist for overpressure, childs pose with bias, seated piriformis stretch/figure 4 with trunk lean      PATIENT EDUCATION:  Education details: DN info per patient instructions Person educated: Patient Education method: Explanation Education comprehension: verbalized understanding  HOME EXERCISE PROGRAM: Access Code: [email or web address] URL: [email or web address] Date: 07/22/2022 Prepared by: Lavinia Sharps  Exercises - Clamshell  - 1 x daily - 7 x weekly - 1 sets - 10 reps - Seated Hip Abduction with Resistance  - 1 x daily - 7 x weekly - 1 sets - 10 reps - Hip Flexor Stretch on Step  - 1 x daily - 7 x weekly - 1 sets - 10 reps  ASSESSMENT:  CLINICAL IMPRESSION: Good initial response to DN and manual therapy with much improved soft tissue mobility noted.  The patient was encouraged in regular performance of HEP post DN including soft tissue lengthening and strengthening exercises to enhance long term benefit.  Reviewed her current ex to emphasize certain ones and modifications for compliance.  Therapist monitoring response to all interventions and modifying treatment accordingly.        OBJECTIVE IMPAIRMENTS: decreased activity tolerance, decreased strength, increased fascial restrictions, impaired perceived functional ability, increased muscle spasms, and pain.   ACTIVITY LIMITATIONS: lifting, bending, sitting, squatting, sleeping, stairs, dressing, hygiene/grooming, and locomotion level  PARTICIPATION LIMITATIONS: meal prep, cleaning, laundry, driving, shopping, community activity, and occupation  PERSONAL FACTORS: Past/current experiences, Time since onset of injury/illness/exacerbation, and 3+ comorbidities: multi joint OA, peripheral neuropathy in both feet, chronic back  pain  are also affecting patient's functional outcome.   REHAB POTENTIAL: Good  CLINICAL DECISION MAKING: moderate EVALUATION COMPLEXITY: moderate   GOALS: Goals reviewed with patient? Yes  SHORT TERM GOALS: Target date: 08/19/2022   The patient will demonstrate knowledge of basic self care strategies and exercises to promote healing   Baseline: Goal status: INITIAL  2.  The patient will report a 30% improvement in pain levels with functional activities  which are currently difficult including bending, sidelying, sitting in the car Baseline:  Goal status: INITIAL  3.  The patient will have improved hip strength to at least 4-/5 needed for standing, walking longer distances and descending stairs at home and in the community  Baseline:  Goal status: INITIAL  4.  Improved LE strength with 5x sit to stand time improved to <13 sec Baseline:  Goal status: INITIAL   LONG TERM GOALS: Target date: 09/16/2022    The patient will be independent in a safe self progression of a home exercise program to promote further recovery of function   Baseline:  Goal status: INITIAL  2.  The patient will report a 60% improvement in pain levels with functional activities which are currently difficult including bending, sidelying,sitting in the car Baseline:  Goal status: INITIAL  3.  The patient will have improved hip strength to at least 4/5 needed for standing, walking longer distances and descending stairs at home and in the community  Baseline:  Goal status: INITIAL    4.  The patient will have improved FOTO score to   68%     indicating improved function with less pain  Baseline:  Goal status: INITIAL     PLAN:  PT FREQUENCY: 1x/week  PT DURATION: 8 weeks  PLANNED INTERVENTIONS: Therapeutic exercises, Therapeutic activity, Neuromuscular re-education, Balance training, Gait training, Patient/Family education, Self Care, Joint mobilization, Aquatic Therapy, Dry Needling,  Cryotherapy, Moist heat, Taping, Ultrasound, Ionotophoresis 4mg /ml Dexamethasone, Manual therapy, and Re-evaluation  PLAN FOR NEXT SESSION: assess response to #1 DN left gluteals, DN right proximal HS consider adding ES to DN prior to spinal cord stimulator implant; low level gluteal and core ex's  Lavinia Sharps, PT 08/04/22 2:22 PM Phone: [phone] Fax: [phone]

## 2022-07-29 NOTE — Patient Instructions (Signed)

## 2022-08-02 ENCOUNTER — Other Ambulatory Visit: Payer: Self-pay | Admitting: Podiatry

## 2022-08-04 ENCOUNTER — Other Ambulatory Visit (HOSPITAL_COMMUNITY): Payer: Self-pay | Admitting: Pain Medicine

## 2022-08-04 ENCOUNTER — Encounter: Payer: Self-pay | Admitting: Cardiology

## 2022-08-04 DIAGNOSIS — M48062 Spinal stenosis, lumbar region with neurogenic claudication: Secondary | ICD-10-CM

## 2022-08-04 DIAGNOSIS — M47814 Spondylosis without myelopathy or radiculopathy, thoracic region: Secondary | ICD-10-CM | POA: Diagnosis not present

## 2022-08-04 DIAGNOSIS — M546 Pain in thoracic spine: Secondary | ICD-10-CM | POA: Diagnosis not present

## 2022-08-05 ENCOUNTER — Ambulatory Visit: Payer: Medicare Other | Admitting: Physical Therapy

## 2022-08-05 DIAGNOSIS — M25551 Pain in right hip: Secondary | ICD-10-CM

## 2022-08-05 DIAGNOSIS — M25552 Pain in left hip: Secondary | ICD-10-CM | POA: Diagnosis not present

## 2022-08-05 DIAGNOSIS — M6281 Muscle weakness (generalized): Secondary | ICD-10-CM

## 2022-08-05 NOTE — Therapy (Signed)
OUTPATIENT PHYSICAL THERAPY LOWER EXTREMITY PROGRESS NOTE   Patient Name: Lorraine [name] MRN: [id number] DOB:[birth year], 72 y.o., female Today's Date: 08/05/2022      PT End of Session - 08/05/22 0742     Visit Number 3    Date for PT Re-Evaluation 09/16/22    Authorization Type Medicare    PT Start Time 0750    PT Stop Time 0830    PT Time Calculation (min) 40 min    Activity Tolerance Patient tolerated treatment well               Past Medical History:  Diagnosis Date   Anxiety    Aortic atherosclerosis (HCC) 02/03/2018   Noted on CT Abd/Pelvis   AR (aortic regurgitation) 07/18/2017   Mild, noted on ECHO   Bilateral cataracts    Bilateral plantar fasciitis    Chronic knee pain    Chronic low back pain    Chronic lumbar radiculopathy 07/14/2017   Mild, L5, noted on electromyography   History of palpitations    2014-- low risk exercise tolerence test, no ischemi, PVC's (09-06-2012)   HSV-1 (herpes simplex virus 1) infection    Cold sore   Hx of colonic polyps    Hyperlipidemia    Insomnia    Lactose intolerance    Left nephrolithiasis 02/03/2018   Nonobstructing, noted on CT Abd/pelvis   Morton's metatarsalgia, neuralgia, or neuroma, bilateral    Osteoarthritis    knees and lumbar, feet   Osteopenia    Peripheral neuropathy    bilateral feet -- burning and stinging   PONV (postoperative nausea and vomiting)    severe   Presence of Watchman left atrial appendage closure device 06/11/2021   Watchman 27mm FLX with Dr. Lalla Brothers   PVC's (premature ventricular contractions)    Systolic murmur    "Slight"   Vaginal cyst    Wears glasses    Past Surgical History:  Procedure Laterality Date   ANTERIOR CERVICAL DECOMP/DISCECTOMY FUSION  01/2014   C4 -- C6   ATRIAL FIBRILLATION ABLATION N/A 04/09/2021   Procedure: ATRIAL FIBRILLATION ABLATION;  Surgeon: Lanier Prude, MD;  Location: [facility];  Service: Cardiovascular;  Laterality: N/A;    BREAST BIOPSY Left 07/17/2018   x2   CATARACT EXTRACTION W/ INTRAOCULAR LENS  IMPLANT, BILATERAL  2015   COLONOSCOPY  05/14/2015   CYSTO WITH HYDRODISTENSION N/A 04/14/2018   Procedure: CYSTOSCOPY/HYDRODISTENSION WITH INSTILLATION OF PYRIDIUM AND MARCAINE, BLADDER BIOPSY WITH FULGERATION 0.5 TO 2 CM;  Surgeon: Jerilee Field, MD;  Location: [facility];  Service: Urology;  Laterality: N/A;   ELBOW DEBRIDEMENT Right 09/10/2010   and tendon debridement and radial tunnel release   EXCISION MORTON'S NEUROMA Bilateral left 02-20-2009/  right & left 05-08-2009   EXCISION VAGINAL CYST N/A 07/01/2016   Procedure: EXCISION VAGINAL CYST;  Surgeon: Ranae Pila, MD;  Location: [facility];  Service: Gynecology;  Laterality: N/A;   LEFT ATRIAL APPENDAGE OCCLUSION N/A 06/11/2021   Procedure: LEFT ATRIAL APPENDAGE OCCLUSION;  Surgeon: Lanier Prude, MD;  Location: [facility];  Service: Cardiovascular;  Laterality: N/A;   PLANTAR FASCIA RELEASE Bilateral left 04-02-2010/  right 04-02-2016   TEE WITHOUT CARDIOVERSION N/A 06/11/2021   Procedure: TRANSESOPHAGEAL ECHOCARDIOGRAM (TEE);  Surgeon: Lanier Prude, MD;  Location: [facility];  Service: Cardiovascular;  Laterality: N/A;   TONSILLECTOMY AND ADENOIDECTOMY  child   TOTAL ABDOMINAL HYSTERECTOMY  1974   w/  Left salpingoophorectomy and Partial right salpingoophorectomy   Patient Active Problem List   Diagnosis Date Noted   Presence of Watchman left atrial appendage closure device 06/12/2021   Atrial fibrillation (HCC) 06/11/2021   Prolapse of female genital organs 07/09/2020   Candidiasis 12/19/2019   Chronic pain of both shoulders 11/28/2019   Eustachian tube dysfunction, bilateral 11/05/2019   Secondary hypercoagulable state (HCC) 03/08/2019   Malignant neoplasm of lower-inner quadrant of left breast in female, estrogen receptor positive (HCC) 07/26/2018   Anxiety about health  05/31/2018   Paroxysmal atrial fibrillation (HCC) 05/18/2018   Overweight (BMI 25.0-29.9) 05/18/2018   Systolic murmur 07/14/2017   Shingles 11/23/2016   Arthritis 05/26/2016   Insomnia 05/26/2016   Neuropathy 05/26/2016   Positive ANA (antinuclear antibody) 05/26/2016   Hyperlipidemia 05/26/2016   Neck and shoulder pain 01/26/2014   Cervicalgia 01/26/2014   METATARSALGIA 05/06/2010   PLANTAR FASCIITIS, LEFT 05/06/2010   FOOT PAIN, BILATERAL 05/06/2010    PCP: Neena Rhymes MD  REFERRING PROVIDER: Romero Belling MD  REFERRING DIAG: bil glute medius tendonopathy, history of glute tear and right ischial bursa pain  THERAPY DIAG:  Hip pain; weakness Rationale for Evaluation and Treatment: Rehabilitation  ONSET DATE: 03/15/22  SUBJECTIVE:   SUBJECTIVE STATEMENT: The DN was good. I was very sore afterwards, a little tough.  Benefit in left hip. No change in HS region.  Difficult to sit in car.  I'm scheduled for trial on June 11th for spinal stimulator. Had an MRI of mid back yesterday.     PERTINENT HISTORY: Several injections in bursa on left over the years, fewer injections on right.  Just finished a Round of prednisone for 6 days helped but the left hip pain has returned.  Tore right glute from yardwork and fell twice years ago and took months to heal.  1 month ago got on recumbent bike at work for 27 min and strained right glute again (ischial area); I don't feel as stable   Does chair yoga; does water exercise on her own at [employer] Walking on the treadmill Resolved A-fib; newly referred to pain clinic for spinal cord stimulator to address peripheral neuropathy in feet;  Chronic lumbar radiculopathy; peripheral neuropathy in feet; cervical fusion Bil knee OA Lots of PT over the year "I didn't get anything out of it"  PAIN:  PAIN:  Are you having pain? Yes NPRS scale: 1-2/10 left; 3/10 on right Pain location: right ischial tuberosity; left lateral hip trochanteric  bursa Aggravating factors: bending down;  sudden movement; sidelying; sitting in the car Relieving factors: walking does not ; movement PRECAUTIONS: None  WEIGHT BEARING RESTRICTIONS: No  FALLS:  Has patient fallen in last 6 months? No   OCCUPATION: working 12 hour shifts  PLOF: Independent  PATIENT GOALS: I told the doctor I didn't need to come but I wanted to do the exercises properly and try the DN (mostly here for the DN)   OBJECTIVE:     PATIENT SURVEYS:  FOTO 65%  COGNITION: Overall cognitive status: Within functional limits for tasks assessed      MUSCLE LENGTH: Hamstrings: Right 70 deg; Left 75 deg Decreased hip flexor length: extension to 5 degrees bil  POSTURE: pelvic drop with attempted SLS <3 sec right/left  PALPATION: Tender points left lateral glutes; tender points right proximal HS at ischial tuberosity; tender bil ITB  LOWER EXTREMITY ROM: Bil knee flexion 125 degrees LOWER EXTREMITY MMT:  MMT Right eval Left eval  Hip flexion 4  4  Hip extension 4- 4-  Hip abduction 3- 3-  Hip adduction    Hip internal rotation    Hip external rotation 4- 4-  Knee flexion 4- 4  Knee extension 4 4  Ankle dorsiflexion    Ankle plantarflexion    Ankle inversion    Ankle eversion     (Blank rows = not tested)  TRUNK STRENGTH:  Decreased activation of transverse abdominus muscles; abdominals 4-/5; decreased activation of lumbar multifidi; trunk extensors 4-/5  FUNCTIONAL TESTS:  5x sit to stand no hands 14.02 sec  GAIT:  Comments: pelvic drop, lateral trunk lean   TODAY'S TREATMENT:     DATE: 5/23 Manual therapy: soft tissue mobilization to bil gluteals, piriformis and right proximal HS Trigger Point Dry-Needling  Treatment instructions: Expect mild to moderate muscle soreness. S/S of pneumothorax if dry needled over a lung field, and to seek immediate medical attention should they occur. Patient verbalized understanding of these instructions and  education.  Patient Consent Given: Yes Education handout provided: Yes Muscles treated:sidelying bil gluteals, bil piriformis, right prox HS in prone Electrical stimulation performed: yes Parameters: 1.0 ma 8 min bilaterally: left glutes, right piriformis and proximal HS Treatment response/outcome: improved soft tissue mobility Therapeutic ex: Avoidance of overstretching HS on right side Leaning on elbow on high table: hip extension straight knee right/left 10x;  bent knee hip extension right/left 10x  Discussion of use of donut style cushion to relieve pressure on ischial bursa                                                                                                                                DATE: 5/16 Manual therapy: soft tissue mobilization to bil gluteals, piriformis and right proximal HS Trigger Point Dry-Needling  Treatment instructions: Expect mild to moderate muscle soreness. S/S of pneumothorax if dry needled over a lung field, and to seek immediate medical attention should they occur. Patient verbalized understanding of these instructions and education.  Patient Consent Given: Yes Education handout provided: Yes Muscles treated:sidelying bil gluteals, bil piriformis, right prox HS in prone Electrical stimulation performed: No Parameters: N/A Treatment response/outcome: improved soft tissue mobility Therapeutic ex: emphasis on small dose stretching: HS with strap with bias side to side. Supine piriformis stretch using towel assist for overpressure, childs pose with bias, seated piriformis stretch/figure 4 with trunk lean      PATIENT EDUCATION:  Education details: DN info per patient instructions Person educated: Patient Education method: Explanation Education comprehension: verbalized understanding  HOME EXERCISE PROGRAM:  Access Code: [email or web address] URL: [email or web address] Date: 08/05/2022 Prepared by: Lavinia Sharps  Exercises - Clamshell   - 1 x daily - 7 x weekly - 1 sets - 10 reps - Seated Hip Abduction with Resistance  - 1 x daily - 7 x weekly - 1 sets - 10 reps - Hip Flexor Stretch on Step  - 1 x daily - 7 x weekly - 1  sets - 10 reps - Standing Hip Extension with Leg Bent and Support  - 1 x daily - 7 x weekly - 1 sets - 10 reps  ASSESSMENT:  CLINICAL IMPRESSION: Added ES to DN for enhanced physiologic benefit and decrease post DN soreness with good initial response.  Patient education on avoidance of overstretching HS on right (limited ROM and number of reps) and use of donut style pillow to decompress ischial bursa in the car.  Therapist monitoring response to all interventions and modifying treatment accordingly.     OBJECTIVE IMPAIRMENTS: decreased activity tolerance, decreased strength, increased fascial restrictions, impaired perceived functional ability, increased muscle spasms, and pain.   ACTIVITY LIMITATIONS: lifting, bending, sitting, squatting, sleeping, stairs, dressing, hygiene/grooming, and locomotion level  PARTICIPATION LIMITATIONS: meal prep, cleaning, laundry, driving, shopping, community activity, and occupation  PERSONAL FACTORS: Past/current experiences, Time since onset of injury/illness/exacerbation, and 3+ comorbidities: multi joint OA, peripheral neuropathy in both feet, chronic back pain  are also affecting patient's functional outcome.   REHAB POTENTIAL: Good  CLINICAL DECISION MAKING: moderate EVALUATION COMPLEXITY: moderate   GOALS: Goals reviewed with patient? Yes  SHORT TERM GOALS: Target date: 08/19/2022   The patient will demonstrate knowledge of basic self care strategies and exercises to promote healing   Baseline: Goal status: INITIAL  2.  The patient will report a 30% improvement in pain levels with functional activities which are currently difficult including bending, sidelying, sitting in the car Baseline:  Goal status: INITIAL  3.  The patient will have improved hip  strength to at least 4-/5 needed for standing, walking longer distances and descending stairs at home and in the community  Baseline:  Goal status: INITIAL  4.  Improved LE strength with 5x sit to stand time improved to <13 sec Baseline:  Goal status: INITIAL   LONG TERM GOALS: Target date: 09/16/2022    The patient will be independent in a safe self progression of a home exercise program to promote further recovery of function   Baseline:  Goal status: INITIAL  2.  The patient will report a 60% improvement in pain levels with functional activities which are currently difficult including bending, sidelying,sitting in the car Baseline:  Goal status: INITIAL  3.  The patient will have improved hip strength to at least 4/5 needed for standing, walking longer distances and descending stairs at home and in the community  Baseline:  Goal status: INITIAL    4.  The patient will have improved FOTO score to   68%     indicating improved function with less pain  Baseline:  Goal status: INITIAL     PLAN:  PT FREQUENCY: 1x/week  PT DURATION: 8 weeks  PLANNED INTERVENTIONS: Therapeutic exercises, Therapeutic activity, Neuromuscular re-education, Balance training, Gait training, Patient/Family education, Self Care, Joint mobilization, Aquatic Therapy, Dry Needling, Cryotherapy, Moist heat, Taping, Ultrasound, Ionotophoresis 4mg /ml Dexamethasone, Manual therapy, and Re-evaluation  PLAN FOR NEXT SESSION: assess response to DN with ES to left gluteals, DN right proximal HS; upcoming spinal cord stimulator implant; low level gluteal and core ex's  Lavinia Sharps, PT 08/05/22 8:37 AM Phone: [phone] Fax: [phone]

## 2022-08-10 ENCOUNTER — Ambulatory Visit (HOSPITAL_COMMUNITY)
Admission: RE | Admit: 2022-08-10 | Discharge: 2022-08-10 | Disposition: A | Discharge status: Home / Self Care | Payer: Medicare Other | Source: Ambulatory Visit | Attending: Pain Medicine | Admitting: Pain Medicine

## 2022-08-10 DIAGNOSIS — M48062 Spinal stenosis, lumbar region with neurogenic claudication: Secondary | ICD-10-CM | POA: Insufficient documentation

## 2022-08-10 DIAGNOSIS — Z6830 Body mass index (BMI) 30.0-30.9, adult: Secondary | ICD-10-CM | POA: Diagnosis not present

## 2022-08-10 DIAGNOSIS — M4316 Spondylolisthesis, lumbar region: Secondary | ICD-10-CM | POA: Diagnosis not present

## 2022-08-10 DIAGNOSIS — M48061 Spinal stenosis, lumbar region without neurogenic claudication: Secondary | ICD-10-CM | POA: Diagnosis not present

## 2022-08-10 DIAGNOSIS — N898 Other specified noninflammatory disorders of vagina: Secondary | ICD-10-CM | POA: Diagnosis not present

## 2022-08-10 DIAGNOSIS — N819 Female genital prolapse, unspecified: Secondary | ICD-10-CM | POA: Diagnosis not present

## 2022-08-10 DIAGNOSIS — N952 Postmenopausal atrophic vaginitis: Secondary | ICD-10-CM | POA: Diagnosis not present

## 2022-08-10 DIAGNOSIS — Z7952 Long term (current) use of systemic steroids: Secondary | ICD-10-CM | POA: Diagnosis not present

## 2022-08-10 DIAGNOSIS — Z779 Other contact with and (suspected) exposures hazardous to health: Secondary | ICD-10-CM | POA: Diagnosis not present

## 2022-08-10 DIAGNOSIS — C50112 Malignant neoplasm of central portion of left female breast: Secondary | ICD-10-CM | POA: Diagnosis not present

## 2022-08-10 DIAGNOSIS — M8588 Other specified disorders of bone density and structure, other site: Secondary | ICD-10-CM | POA: Diagnosis not present

## 2022-08-10 DIAGNOSIS — N958 Other specified menopausal and perimenopausal disorders: Secondary | ICD-10-CM | POA: Diagnosis not present

## 2022-08-10 DIAGNOSIS — M47896 Other spondylosis, lumbar region: Secondary | ICD-10-CM | POA: Diagnosis not present

## 2022-08-11 ENCOUNTER — Ambulatory Visit: Payer: Medicare Other | Admitting: Physical Therapy

## 2022-08-11 DIAGNOSIS — M25551 Pain in right hip: Secondary | ICD-10-CM | POA: Diagnosis not present

## 2022-08-11 DIAGNOSIS — M25552 Pain in left hip: Secondary | ICD-10-CM | POA: Diagnosis not present

## 2022-08-11 DIAGNOSIS — M6281 Muscle weakness (generalized): Secondary | ICD-10-CM

## 2022-08-11 NOTE — Therapy (Signed)
OUTPATIENT PHYSICAL THERAPY LOWER EXTREMITY PROGRESS NOTE   Patient Name: Lorraine [name] MRN: [id number] DOB:Jan 22, 1951, 72 y.o., female Today's Date: 08/11/2022      PT End of Session - 08/11/22 0748     Visit Number 4    Date for PT Re-Evaluation 09/16/22    Authorization Type Medicare    PT Start Time 0756    PT Stop Time 0834    PT Time Calculation (min) 38 min    Activity Tolerance Patient tolerated treatment well               Past Medical History:  Diagnosis Date   Anxiety    Aortic atherosclerosis (HCC) 02/03/2018   Noted on CT Abd/Pelvis   AR (aortic regurgitation) 07/18/2017   Mild, noted on ECHO   Bilateral cataracts    Bilateral plantar fasciitis    Chronic knee pain    Chronic low back pain    Chronic lumbar radiculopathy 07/14/2017   Mild, L5, noted on electromyography   History of palpitations    2014-- low risk exercise tolerence test, no ischemi, PVC's (09-06-2012)   HSV-1 (herpes simplex virus 1) infection    Cold sore   Hx of colonic polyps    Hyperlipidemia    Insomnia    Lactose intolerance    Left nephrolithiasis 02/03/2018   Nonobstructing, noted on CT Abd/pelvis   Morton's metatarsalgia, neuralgia, or neuroma, bilateral    Osteoarthritis    knees and lumbar, feet   Osteopenia    Peripheral neuropathy    bilateral feet -- burning and stinging   PONV (postoperative nausea and vomiting)    severe   Presence of Watchman left atrial appendage closure device 06/11/2021   Watchman 27mm FLX with Dr. Lalla Brothers   PVC's (premature ventricular contractions)    Systolic murmur    "Slight"   Vaginal cyst    Wears glasses    Past Surgical History:  Procedure Laterality Date   ANTERIOR CERVICAL DECOMP/DISCECTOMY FUSION  01/2014   C4 -- C6   ATRIAL FIBRILLATION ABLATION N/A 04/09/2021   Procedure: ATRIAL FIBRILLATION ABLATION;  Surgeon: Lanier Prude, MD;  Location: [facility];  Service: Cardiovascular;  Laterality: N/A;    BREAST BIOPSY Left 07/17/2018   x2   CATARACT EXTRACTION W/ INTRAOCULAR LENS  IMPLANT, BILATERAL  2015   COLONOSCOPY  05/14/2015   CYSTO WITH HYDRODISTENSION N/A 04/14/2018   Procedure: CYSTOSCOPY/HYDRODISTENSION WITH INSTILLATION OF PYRIDIUM AND MARCAINE, BLADDER BIOPSY WITH FULGERATION 0.5 TO 2 CM;  Surgeon: Jerilee Field, MD;  Location: [facility];  Service: Urology;  Laterality: N/A;   ELBOW DEBRIDEMENT Right 09/10/2010   and tendon debridement and radial tunnel release   EXCISION MORTON'S NEUROMA Bilateral left 02-20-2009/  right & left 05-08-2009   EXCISION VAGINAL CYST N/A 07/01/2016   Procedure: EXCISION VAGINAL CYST;  Surgeon: Ranae Pila, MD;  Location: [facility];  Service: Gynecology;  Laterality: N/A;   LEFT ATRIAL APPENDAGE OCCLUSION N/A 06/11/2021   Procedure: LEFT ATRIAL APPENDAGE OCCLUSION;  Surgeon: Lanier Prude, MD;  Location: [facility];  Service: Cardiovascular;  Laterality: N/A;   PLANTAR FASCIA RELEASE Bilateral left 04-02-2010/  right 04-02-2016   TEE WITHOUT CARDIOVERSION N/A 06/11/2021   Procedure: TRANSESOPHAGEAL ECHOCARDIOGRAM (TEE);  Surgeon: Lanier Prude, MD;  Location: [facility];  Service: Cardiovascular;  Laterality: N/A;   TONSILLECTOMY AND ADENOIDECTOMY  child   TOTAL ABDOMINAL HYSTERECTOMY  1974   w/  Left salpingoophorectomy and Partial right salpingoophorectomy   Patient Active Problem List   Diagnosis Date Noted   Presence of Watchman left atrial appendage closure device 06/12/2021   Atrial fibrillation (HCC) 06/11/2021   Prolapse of female genital organs 07/09/2020   Candidiasis 12/19/2019   Chronic pain of both shoulders 11/28/2019   Eustachian tube dysfunction, bilateral 11/05/2019   Secondary hypercoagulable state (HCC) 03/08/2019   Malignant neoplasm of lower-inner quadrant of left breast in female, estrogen receptor positive (HCC) 07/26/2018   Anxiety about health  05/31/2018   Paroxysmal atrial fibrillation (HCC) 05/18/2018   Overweight (BMI 25.0-29.9) 05/18/2018   Systolic murmur 07/14/2017   Shingles 11/23/2016   Arthritis 05/26/2016   Insomnia 05/26/2016   Neuropathy 05/26/2016   Positive ANA (antinuclear antibody) 05/26/2016   Hyperlipidemia 05/26/2016   Neck and shoulder pain 01/26/2014   Cervicalgia 01/26/2014   METATARSALGIA 05/06/2010   PLANTAR FASCIITIS, LEFT 05/06/2010   FOOT PAIN, BILATERAL 05/06/2010    PCP: Neena Rhymes MD  REFERRING PROVIDER: Romero Belling MD  REFERRING DIAG: bil glute medius tendonopathy, history of glute tear and right ischial bursa pain  THERAPY DIAG:  Hip pain; weakness Rationale for Evaluation and Treatment: Rehabilitation  ONSET DATE: 03/15/22  SUBJECTIVE:   SUBJECTIVE STATEMENT: The donut cushion made it worse.  I did yard work so I was sore.  It's marginally better on right ischial area. Left hip is better.  The TENs helped reduce soreness afterwards.  These ITBs on both sides are hurting.    Having procedure on 6/11.     PERTINENT HISTORY: Several injections in bursa on left over the years, fewer injections on right.  Just finished a Round of prednisone for 6 days helped but the left hip pain has returned.  Tore right glute from yardwork and fell twice years ago and took months to heal.  1 month ago got on recumbent bike at work for 27 min and strained right glute again (ischial area); I don't feel as stable   Does chair yoga; does water exercise on her own at [employer] Walking on the treadmill Resolved A-fib; newly referred to pain clinic for spinal cord stimulator to address peripheral neuropathy in feet;  Chronic lumbar radiculopathy; peripheral neuropathy in feet; cervical fusion Bil knee OA Lots of PT over the year "I didn't get anything out of it"  PAIN:  PAIN:  Are you having pain? Yes NPRS scale: 3-4/10 on right and left hips and ITB Pain location: right ischial tuberosity;  left lateral hip trochanteric bursa Aggravating factors: bending down;  sudden movement; sidelying; sitting in the car Relieving factors: walking does not ; movement PRECAUTIONS: None  WEIGHT BEARING RESTRICTIONS: No  FALLS:  Has patient fallen in last 6 months? No   OCCUPATION: working 12 hour shifts  PLOF: Independent  PATIENT GOALS: I told the doctor I didn't need to come but I wanted to do the exercises properly and try the DN (mostly here for the DN)   OBJECTIVE:     PATIENT SURVEYS:  FOTO 65%  COGNITION: Overall cognitive status: Within functional limits for tasks assessed      MUSCLE LENGTH: Hamstrings: Right 70 deg; Left 75 deg Decreased hip flexor length: extension to 5 degrees bil  POSTURE: pelvic drop with attempted SLS <3 sec right/left  PALPATION: Tender points left lateral glutes; tender points right proximal HS at ischial tuberosity; tender bil ITB  LOWER EXTREMITY ROM: Bil knee flexion 125 degrees LOWER EXTREMITY MMT:  MMT Right eval  Left eval  Hip flexion 4 4  Hip extension 4- 4-  Hip abduction 3- 3-  Hip adduction    Hip internal rotation    Hip external rotation 4- 4-  Knee flexion 4- 4  Knee extension 4 4  Ankle dorsiflexion    Ankle plantarflexion    Ankle inversion    Ankle eversion     (Blank rows = not tested)  TRUNK STRENGTH:  Decreased activation of transverse abdominus muscles; abdominals 4-/5; decreased activation of lumbar multifidi; trunk extensors 4-/5  FUNCTIONAL TESTS:  5x sit to stand no hands 14.02 sec  GAIT:  Comments: pelvic drop, lateral trunk lean   TODAY'S TREATMENT:     DATE: 5/29 Manual therapy: soft tissue mobilization to bil gluteals, bil ITB Trigger Point Dry-Needling  Treatment instructions: Expect mild to moderate muscle soreness. S/S of pneumothorax if dry needled over a lung field, and to seek immediate medical attention should they occur. Patient verbalized understanding of these instructions  and education.  Patient Consent Given: Yes Education handout provided: Yes Muscles treated:sidelying bil gluteals and bil ITB Electrical stimulation performed: yes Parameters: 1.0 ma 8 min bilaterally: bil ITB in prone Treatment response/outcome: improved soft tissue mobility   DATE: 5/23 Manual therapy: soft tissue mobilization to bil gluteals, piriformis and right proximal HS Trigger Point Dry-Needling  Treatment instructions: Expect mild to moderate muscle soreness. S/S of pneumothorax if dry needled over a lung field, and to seek immediate medical attention should they occur. Patient verbalized understanding of these instructions and education.  Patient Consent Given: Yes Education handout provided: Yes Muscles treated:sidelying bil gluteals, bil piriformis, right prox HS in prone Electrical stimulation performed: yes Parameters: 1.0 ma 8 min bilaterally: left glutes, right piriformis and proximal HS Treatment response/outcome: improved soft tissue mobility Therapeutic ex: Avoidance of overstretching HS on right side Leaning on elbow on high table: hip extension straight knee right/left 10x;  bent knee hip extension right/left 10x  Discussion of use of donut style cushion to relieve pressure on ischial bursa                                                                                                                                DATE: 5/16 Manual therapy: soft tissue mobilization to bil gluteals, piriformis and right proximal HS Trigger Point Dry-Needling  Treatment instructions: Expect mild to moderate muscle soreness. S/S of pneumothorax if dry needled over a lung field, and to seek immediate medical attention should they occur. Patient verbalized understanding of these instructions and education.  Patient Consent Given: Yes Education handout provided: Yes Muscles treated:sidelying bil gluteals, bil piriformis, right prox HS in prone Electrical stimulation performed:  No Parameters: N/A Treatment response/outcome: improved soft tissue mobility Therapeutic ex: emphasis on small dose stretching: HS with strap with bias side to side. Supine piriformis stretch using towel assist for overpressure, childs pose with bias, seated piriformis stretch/figure 4 with trunk lean  PATIENT EDUCATION:  Education details: DN info per patient instructions Person educated: Patient Education method: Explanation Education comprehension: verbalized understanding  HOME EXERCISE PROGRAM:  Access Code: [email or web address] URL: [email or web address] Date: 08/05/2022 Prepared by: Lavinia Sharps  Exercises - Clamshell  - 1 x daily - 7 x weekly - 1 sets - 10 reps - Seated Hip Abduction with Resistance  - 1 x daily - 7 x weekly - 1 sets - 10 reps - Hip Flexor Stretch on Step  - 1 x daily - 7 x weekly - 1 sets - 10 reps - Standing Hip Extension with Leg Bent and Support  - 1 x daily - 7 x weekly - 1 sets - 10 reps  ASSESSMENT:  CLINICAL IMPRESSION: The patient is highly sensitive in bil ITB today in response to yardwork on Monday.  She reports improvement in left hip pain from current treatment however improvement in left ischial pain is "marginal".  High irritability level requires a slower progression of load and longer course of recovery.  Therapist monitoring response to all interventions and modifying treatment accordingly.   Visit frequency less over the next few weeks as she has procedures for spinal cord stimulator implant.       OBJECTIVE IMPAIRMENTS: decreased activity tolerance, decreased strength, increased fascial restrictions, impaired perceived functional ability, increased muscle spasms, and pain.   ACTIVITY LIMITATIONS: lifting, bending, sitting, squatting, sleeping, stairs, dressing, hygiene/grooming, and locomotion level  PARTICIPATION LIMITATIONS: meal prep, cleaning, laundry, driving, shopping, community activity, and occupation  PERSONAL  FACTORS: Past/current experiences, Time since onset of injury/illness/exacerbation, and 3+ comorbidities: multi joint OA, peripheral neuropathy in both feet, chronic back pain  are also affecting patient's functional outcome.   REHAB POTENTIAL: Good  CLINICAL DECISION MAKING: moderate EVALUATION COMPLEXITY: moderate   GOALS: Goals reviewed with patient? Yes  SHORT TERM GOALS: Target date: 08/19/2022   The patient will demonstrate knowledge of basic self care strategies and exercises to promote healing   Baseline: Goal status: met 5/29  2.  The patient will report a 30% improvement in pain levels with functional activities which are currently difficult including bending, sidelying, sitting in the car Baseline:  Goal status: ongoing  3.  The patient will have improved hip strength to at least 4-/5 needed for standing, walking longer distances and descending stairs at home and in the community  Baseline:  Goal status: ongoing  4.  Improved LE strength with 5x sit to stand time improved to <13 sec Baseline:  Goal status: ongoing   LONG TERM GOALS: Target date: 09/16/2022    The patient will be independent in a safe self progression of a home exercise program to promote further recovery of function   Baseline:  Goal status: INITIAL  2.  The patient will report a 60% improvement in pain levels with functional activities which are currently difficult including bending, sidelying,sitting in the car Baseline:  Goal status: INITIAL  3.  The patient will have improved hip strength to at least 4/5 needed for standing, walking longer distances and descending stairs at home and in the community  Baseline:  Goal status: INITIAL    4.  The patient will have improved FOTO score to   68%     indicating improved function with less pain  Baseline:  Goal status: INITIAL     PLAN:  PT FREQUENCY: 1x/week  PT DURATION: 8 weeks  PLANNED INTERVENTIONS: Therapeutic exercises,  Therapeutic activity, Neuromuscular re-education, Balance training, Gait training, Patient/Family education, Self Care, Joint  mobilization, Aquatic Therapy, Dry Needling, Cryotherapy, Moist heat, Taping, Ultrasound, Ionotophoresis 4mg /ml Dexamethasone, Manual therapy, and Re-evaluation  PLAN FOR NEXT SESSION:check % and 5x sit to stand for STGs; assess response to DN with ES; upcoming spinal cord stimulator implant pt to ask about use of ES in the future; low level gluteal and core ex's  Lavinia Sharps, PT 08/11/22 12:25 PM Phone: [phone] Fax: [phone]

## 2022-08-18 ENCOUNTER — Other Ambulatory Visit: Payer: Self-pay

## 2022-08-18 MED ORDER — ALPRAZOLAM 0.5 MG PO TABS: 0.5000 mg | ORAL_TABLET | Freq: Two times a day (BID) | ORAL | 1 refills | Status: DC | PRN

## 2022-08-18 NOTE — Telephone Encounter (Signed)
Xanax 0.5 mg LOV: 06/28/22 Last Refill:04/28/22 Upcoming appt: 12/08/22

## 2022-08-18 NOTE — Telephone Encounter (Signed)
Left VM stating Rx has been sent in  

## 2022-08-24 ENCOUNTER — Ambulatory Visit: Payer: Medicare Other | Attending: Physical Medicine and Rehabilitation | Admitting: Physical Therapy

## 2022-08-24 ENCOUNTER — Ambulatory Visit: Payer: Medicare Other | Admitting: Physical Therapy

## 2022-08-24 DIAGNOSIS — M25551 Pain in right hip: Secondary | ICD-10-CM | POA: Insufficient documentation

## 2022-08-24 DIAGNOSIS — M25552 Pain in left hip: Secondary | ICD-10-CM | POA: Insufficient documentation

## 2022-08-24 DIAGNOSIS — M6281 Muscle weakness (generalized): Secondary | ICD-10-CM | POA: Insufficient documentation

## 2022-08-24 DIAGNOSIS — M961 Postlaminectomy syndrome, not elsewhere classified: Secondary | ICD-10-CM | POA: Diagnosis not present

## 2022-08-24 DIAGNOSIS — Z6829 Body mass index (BMI) 29.0-29.9, adult: Secondary | ICD-10-CM | POA: Diagnosis not present

## 2022-08-24 DIAGNOSIS — M5459 Other low back pain: Secondary | ICD-10-CM | POA: Insufficient documentation

## 2022-08-24 NOTE — Therapy (Signed)
OUTPATIENT PHYSICAL THERAPY LOWER EXTREMITY PROGRESS NOTE   Patient Name: Lorraine [name] MRN: [id number] DOB:03-Jan-1951, 72 y.o., female Today's Date: 08/24/2022      PT End of Session - 08/24/22 0754     Visit Number 5    Date for PT Re-Evaluation 09/16/22    Authorization Type Medicare    PT Start Time 0755    PT Stop Time 0835    PT Time Calculation (min) 40 min    Activity Tolerance Patient tolerated treatment well               Past Medical History:  Diagnosis Date   Anxiety    Aortic atherosclerosis (HCC) 02/03/2018   Noted on CT Abd/Pelvis   AR (aortic regurgitation) 07/18/2017   Mild, noted on ECHO   Bilateral cataracts    Bilateral plantar fasciitis    Chronic knee pain    Chronic low back pain    Chronic lumbar radiculopathy 07/14/2017   Mild, L5, noted on electromyography   History of palpitations    2014-- low risk exercise tolerence test, no ischemi, PVC's (09-06-2012)   HSV-1 (herpes simplex virus 1) infection    Cold sore   Hx of colonic polyps    Hyperlipidemia    Insomnia    Lactose intolerance    Left nephrolithiasis 02/03/2018   Nonobstructing, noted on CT Abd/pelvis   Morton's metatarsalgia, neuralgia, or neuroma, bilateral    Osteoarthritis    knees and lumbar, feet   Osteopenia    Peripheral neuropathy    bilateral feet -- burning and stinging   PONV (postoperative nausea and vomiting)    severe   Presence of Watchman left atrial appendage closure device 06/11/2021   Watchman 27mm FLX with Dr. Lalla Brothers   PVC's (premature ventricular contractions)    Systolic murmur    "Slight"   Vaginal cyst    Wears glasses    Past Surgical History:  Procedure Laterality Date   ANTERIOR CERVICAL DECOMP/DISCECTOMY FUSION  01/2014   C4 -- C6   ATRIAL FIBRILLATION ABLATION N/A 04/09/2021   Procedure: ATRIAL FIBRILLATION ABLATION;  Surgeon: Lanier Prude, MD;  Location: [facility];  Service: Cardiovascular;  Laterality: N/A;    BREAST BIOPSY Left 07/17/2018   x2   CATARACT EXTRACTION W/ INTRAOCULAR LENS  IMPLANT, BILATERAL  2015   COLONOSCOPY  05/14/2015   CYSTO WITH HYDRODISTENSION N/A 04/14/2018   Procedure: CYSTOSCOPY/HYDRODISTENSION WITH INSTILLATION OF PYRIDIUM AND MARCAINE, BLADDER BIOPSY WITH FULGERATION 0.5 TO 2 CM;  Surgeon: Jerilee Field, MD;  Location: [facility];  Service: Urology;  Laterality: N/A;   ELBOW DEBRIDEMENT Right 09/10/2010   and tendon debridement and radial tunnel release   EXCISION MORTON'S NEUROMA Bilateral left 02-20-2009/  right & left 05-08-2009   EXCISION VAGINAL CYST N/A 07/01/2016   Procedure: EXCISION VAGINAL CYST;  Surgeon: Ranae Pila, MD;  Location: [facility];  Service: Gynecology;  Laterality: N/A;   LEFT ATRIAL APPENDAGE OCCLUSION N/A 06/11/2021   Procedure: LEFT ATRIAL APPENDAGE OCCLUSION;  Surgeon: Lanier Prude, MD;  Location: [facility];  Service: Cardiovascular;  Laterality: N/A;   PLANTAR FASCIA RELEASE Bilateral left 04-02-2010/  right 04-02-2016   TEE WITHOUT CARDIOVERSION N/A 06/11/2021   Procedure: TRANSESOPHAGEAL ECHOCARDIOGRAM (TEE);  Surgeon: Lanier Prude, MD;  Location: [facility];  Service: Cardiovascular;  Laterality: N/A;   TONSILLECTOMY AND ADENOIDECTOMY  child   TOTAL ABDOMINAL HYSTERECTOMY  1974   w/  Left salpingoophorectomy and Partial right salpingoophorectomy   Patient Active Problem List   Diagnosis Date Noted   Presence of Watchman left atrial appendage closure device 06/12/2021   Atrial fibrillation (HCC) 06/11/2021   Prolapse of female genital organs 07/09/2020   Candidiasis 12/19/2019   Chronic pain of both shoulders 11/28/2019   Eustachian tube dysfunction, bilateral 11/05/2019   Secondary hypercoagulable state (HCC) 03/08/2019   Malignant neoplasm of lower-inner quadrant of left breast in female, estrogen receptor positive (HCC) 07/26/2018   Anxiety about health  05/31/2018   Paroxysmal atrial fibrillation (HCC) 05/18/2018   Overweight (BMI 25.0-29.9) 05/18/2018   Systolic murmur 07/14/2017   Shingles 11/23/2016   Arthritis 05/26/2016   Insomnia 05/26/2016   Neuropathy 05/26/2016   Positive ANA (antinuclear antibody) 05/26/2016   Hyperlipidemia 05/26/2016   Neck and shoulder pain 01/26/2014   Cervicalgia 01/26/2014   METATARSALGIA 05/06/2010   PLANTAR FASCIITIS, LEFT 05/06/2010   FOOT PAIN, BILATERAL 05/06/2010    PCP: Neena Rhymes MD  REFERRING PROVIDER: Romero Belling MD  REFERRING DIAG: bil glute medius tendonopathy, history of glute tear and right ischial bursa pain  THERAPY DIAG:  Hip pain; weakness Rationale for Evaluation and Treatment: Rehabilitation  ONSET DATE: 03/15/22  SUBJECTIVE:   SUBJECTIVE STATEMENT: Cancelled spinal cord stimulator trial secondary to MRI results for lumbar spine/stenosis.  Patient brought in MRI report on lumbar spine.  Will see Dr. Mikal Plane today.  ITBs felt better after last session bending over 4-5 hours of yardwork re-aggravated.  I really think the DN is helping.  Left hip is 20   % better.  Right side  20  % better.   New MRI:   "progressive lumbar disc and facet degeneration.  Severe spinal stenosis and mild to moderate right neural foraminal stenosis at L4-5, mild to moderate left lateral recess and left neural foraminal stenosis L1-2"  PERTINENT HISTORY: Several injections in bursa on left over the years, fewer injections on right.  Just finished a Round of prednisone for 6 days helped but the left hip pain has returned.  Tore right glute from yardwork and fell twice years ago and took months to heal.  1 month ago got on recumbent bike at work for 27 min and strained right glute again (ischial area); I don't feel as stable   Does chair yoga; does water exercise on her own at [employer] Walking on the treadmill Resolved A-fib; newly referred to pain clinic for spinal cord stimulator to  address peripheral neuropathy in feet;  Chronic lumbar radiculopathy; peripheral neuropathy in feet; cervical fusion Bil knee OA Lots of PT over the year "I didn't get anything out of it"  PAIN:  PAIN:  Are you having pain? Yes NPRS scale: 3-4/10 on right and left hips and ITB Pain location: right ischial tuberosity; left lateral hip trochanteric bursa Aggravating factors: bending down;  sudden movement; sidelying; sitting in the car Relieving factors: walking does not ; movement PRECAUTIONS: None  WEIGHT BEARING RESTRICTIONS: No  FALLS:  Has patient fallen in last 6 months? No   OCCUPATION: working 12 hour shifts  PLOF: Independent  PATIENT GOALS: I told the doctor I didn't need to come but I wanted to do the exercises properly and try the DN (mostly here for the DN)   OBJECTIVE:     PATIENT SURVEYS:  FOTO 65%  COGNITION: Overall cognitive status: Within functional limits for tasks assessed      MUSCLE LENGTH: Hamstrings: Right 70 deg; Left 75 deg Decreased  hip flexor length: extension to 5 degrees bil  POSTURE: pelvic drop with attempted SLS <3 sec right/left  PALPATION: Tender points left lateral glutes; tender points right proximal HS at ischial tuberosity; tender bil ITB  LOWER EXTREMITY ROM: Bil knee flexion 125 degrees LOWER EXTREMITY MMT:  MMT Right eval Left eval  Hip flexion 4 4  Hip extension 4- 4-  Hip abduction 3- 3-  Hip adduction    Hip internal rotation    Hip external rotation 4- 4-  Knee flexion 4- 4  Knee extension 4 4  Ankle dorsiflexion    Ankle plantarflexion    Ankle inversion    Ankle eversion     (Blank rows = not tested)  TRUNK STRENGTH:  Decreased activation of transverse abdominus muscles; abdominals 4-/5; decreased activation of lumbar multifidi; trunk extensors 4-/5  FUNCTIONAL TESTS:  5x sit to stand no hands 14.02 sec 6/11:  9.51 sec  GAIT:  Comments: pelvic drop, lateral trunk lean   TODAY'S TREATMENT:      DATE: 6/11 Patient education on MRI results: stenosis, age related changes Lifestyle modifications for stenosis Progress toward goals 5x sit to stand Manual therapy: soft tissue mobilization to bil gluteals, bil ITB Trigger Point Dry-Needling  Treatment instructions: Expect mild to moderate muscle soreness. S/S of pneumothorax if dry needled over a lung field, and to seek immediate medical attention should they occur. Patient verbalized understanding of these instructions and education.  Patient Consent Given: Yes Education handout provided: Yes Muscles treated:sidelying bil gluteals and bil ITB Electrical stimulation performed: yes Parameters: 1.0 ma 8 min bilaterally: bil ITB and bil gluteals in prone Treatment response/outcome: improved soft tissue mobility  DATE: 5/29 Manual therapy: soft tissue mobilization to bil gluteals, bil ITB Trigger Point Dry-Needling  Treatment instructions: Expect mild to moderate muscle soreness. S/S of pneumothorax if dry needled over a lung field, and to seek immediate medical attention should they occur. Patient verbalized understanding of these instructions and education.  Patient Consent Given: Yes Education handout provided: Yes Muscles treated:sidelying bil gluteals and bil ITB Electrical stimulation performed: yes Parameters: 1.0 ma 8 min bilaterally: bil ITB in prone Treatment response/outcome: improved soft tissue mobility   DATE: 5/23 Manual therapy: soft tissue mobilization to bil gluteals, piriformis and right proximal HS Trigger Point Dry-Needling  Treatment instructions: Expect mild to moderate muscle soreness. S/S of pneumothorax if dry needled over a lung field, and to seek immediate medical attention should they occur. Patient verbalized understanding of these instructions and education.  Patient Consent Given: Yes Education handout provided: Yes Muscles treated:sidelying bil gluteals, bil piriformis, right prox HS in  prone Electrical stimulation performed: yes Parameters: 1.0 ma 8 min bilaterally: left glutes, right piriformis and proximal HS Treatment response/outcome: improved soft tissue mobility Therapeutic ex: Avoidance of overstretching HS on right side Leaning on elbow on high table: hip extension straight knee right/left 10x;  bent knee hip extension right/left 10x  Discussion of use of donut style cushion to relieve pressure on ischial bursa  DATE: 5/16 Manual therapy: soft tissue mobilization to bil gluteals, piriformis and right proximal HS Trigger Point Dry-Needling  Treatment instructions: Expect mild to moderate muscle soreness. S/S of pneumothorax if dry needled over a lung field, and to seek immediate medical attention should they occur. Patient verbalized understanding of these instructions and education.  Patient Consent Given: Yes Education handout provided: Yes Muscles treated:sidelying bil gluteals, bil piriformis, right prox HS in prone Electrical stimulation performed: No Parameters: N/A Treatment response/outcome: improved soft tissue mobility Therapeutic ex: emphasis on small dose stretching: HS with strap with bias side to side. Supine piriformis stretch using towel assist for overpressure, childs pose with bias, seated piriformis stretch/figure 4 with trunk lean      PATIENT EDUCATION:  Education details: DN info per patient instructions Person educated: Patient Education method: Explanation Education comprehension: verbalized understanding  HOME EXERCISE PROGRAM:  Access Code: [email or web address] URL: [email or web address] Date: 08/05/2022 Prepared by: Lavinia Sharps  Exercises - Clamshell  - 1 x daily - 7 x weekly - 1 sets - 10 reps - Seated Hip Abduction with Resistance  - 1 x daily - 7 x weekly - 1 sets - 10 reps - Hip Flexor  Stretch on Step  - 1 x daily - 7 x weekly - 1 sets - 10 reps - Standing Hip Extension with Leg Bent and Support  - 1 x daily - 7 x weekly - 1 sets - 10 reps  ASSESSMENT:  CLINICAL IMPRESSION: The patient reports a 20% improvement in her hips since start of care.  Longer duration of recovery expected based on pathophysiology of healing for tendinopathies.  Continues to be highly sensitive in lateral hip musculature bilaterally.  Good initial response to DN combined with ES and manual therapy.  Progressing with STGS.  Therapist monitoring response to all interventions and modifying treatment accordingly.       OBJECTIVE IMPAIRMENTS: decreased activity tolerance, decreased strength, increased fascial restrictions, impaired perceived functional ability, increased muscle spasms, and pain.   ACTIVITY LIMITATIONS: lifting, bending, sitting, squatting, sleeping, stairs, dressing, hygiene/grooming, and locomotion level  PARTICIPATION LIMITATIONS: meal prep, cleaning, laundry, driving, shopping, community activity, and occupation  PERSONAL FACTORS: Past/current experiences, Time since onset of injury/illness/exacerbation, and 3+ comorbidities: multi joint OA, peripheral neuropathy in both feet, chronic back pain  are also affecting patient's functional outcome.   REHAB POTENTIAL: Good  CLINICAL DECISION MAKING: moderate EVALUATION COMPLEXITY: moderate   GOALS: Goals reviewed with patient? Yes  SHORT TERM GOALS: Target date: 08/19/2022   The patient will demonstrate knowledge of basic self care strategies and exercises to promote healing   Baseline: Goal status: met 5/29  2.  The patient will report a 30% improvement in pain levels with functional activities which are currently difficult including bending, sidelying, sitting in the car Baseline:  Goal status: partially met 20%  3.  The patient will have improved hip strength to at least 4-/5 needed for standing, walking longer distances  and descending stairs at home and in the community  Baseline:  Goal status: met 6/11  4.  Improved LE strength with 5x sit to stand time improved to <13 sec Baseline:  Goal status: met 6/11  LONG TERM GOALS: Target date: 09/16/2022    The patient will be independent in a safe self progression of a home exercise program to promote further recovery of function   Baseline:  Goal status: INITIAL  2.  The patient will report a 60% improvement in pain levels with functional activities which  are currently difficult including bending, sidelying,sitting in the car Baseline:  Goal status: INITIAL  3.  The patient will have improved hip strength to at least 4/5 needed for standing, walking longer distances and descending stairs at home and in the community  Baseline:  Goal status: INITIAL    4.  The patient will have improved FOTO score to   68%     indicating improved function with less pain  Baseline:  Goal status: INITIAL     PLAN:  PT FREQUENCY: 1x/week  PT DURATION: 8 weeks  PLANNED INTERVENTIONS: Therapeutic exercises, Therapeutic activity, Neuromuscular re-education, Balance training, Gait training, Patient/Family education, Self Care, Joint mobilization, Aquatic Therapy, Dry Needling, Cryotherapy, Moist heat, Taping, Ultrasound, Ionotophoresis 4mg /ml Dexamethasone, Manual therapy, and Re-evaluation  PLAN FOR NEXT SESSION: assess response to DN with ES; low level gluteal and core ex's;  see how appt went with Dr. Mikal Plane; patient education on stenosis;  pt may get referral from PCP to add lumbar spine to plan of care; decompression series  Lavinia Sharps, PT 08/24/22 5:05 PM Phone: [phone] Fax: [phone]

## 2022-08-31 ENCOUNTER — Other Ambulatory Visit: Payer: Self-pay | Admitting: Podiatry

## 2022-09-01 ENCOUNTER — Ambulatory Visit: Payer: Medicare Other | Admitting: Physical Therapy

## 2022-09-01 DIAGNOSIS — M5459 Other low back pain: Secondary | ICD-10-CM | POA: Diagnosis not present

## 2022-09-01 DIAGNOSIS — M6281 Muscle weakness (generalized): Secondary | ICD-10-CM | POA: Diagnosis not present

## 2022-09-01 DIAGNOSIS — M25552 Pain in left hip: Secondary | ICD-10-CM | POA: Diagnosis not present

## 2022-09-01 DIAGNOSIS — M25551 Pain in right hip: Secondary | ICD-10-CM | POA: Diagnosis not present

## 2022-09-01 NOTE — Therapy (Signed)
OUTPATIENT PHYSICAL THERAPY LOWER EXTREMITY PROGRESS NOTE   Patient Name: Lorraine [name] MRN: [id number] DOB:1950/11/22, 73 y.o., female Today's Date: 09/01/2022      PT End of Session - 09/01/22 0845     Visit Number 6    Date for PT Re-Evaluation 09/16/22    Authorization Type Medicare    PT Start Time 0845    PT Stop Time 0926    PT Time Calculation (min) 41 min    Activity Tolerance Patient tolerated treatment well               Past Medical History:  Diagnosis Date   Anxiety    Aortic atherosclerosis (HCC) 02/03/2018   Noted on CT Abd/Pelvis   AR (aortic regurgitation) 07/18/2017   Mild, noted on ECHO   Bilateral cataracts    Bilateral plantar fasciitis    Chronic knee pain    Chronic low back pain    Chronic lumbar radiculopathy 07/14/2017   Mild, L5, noted on electromyography   History of palpitations    2014-- low risk exercise tolerence test, no ischemi, PVC's (09-06-2012)   HSV-1 (herpes simplex virus 1) infection    Cold sore   Hx of colonic polyps    Hyperlipidemia    Insomnia    Lactose intolerance    Left nephrolithiasis 02/03/2018   Nonobstructing, noted on CT Abd/pelvis   Morton's metatarsalgia, neuralgia, or neuroma, bilateral    Osteoarthritis    knees and lumbar, feet   Osteopenia    Peripheral neuropathy    bilateral feet -- burning and stinging   PONV (postoperative nausea and vomiting)    severe   Presence of Watchman left atrial appendage closure device 06/11/2021   Watchman 27mm FLX with Dr. Lalla Brothers   PVC's (premature ventricular contractions)    Systolic murmur    "Slight"   Vaginal cyst    Wears glasses    Past Surgical History:  Procedure Laterality Date   ANTERIOR CERVICAL DECOMP/DISCECTOMY FUSION  01/2014   C4 -- C6   ATRIAL FIBRILLATION ABLATION N/A 04/09/2021   Procedure: ATRIAL FIBRILLATION ABLATION;  Surgeon: Lanier Prude, MD;  Location: [facility];  Service: Cardiovascular;  Laterality: N/A;    BREAST BIOPSY Left 07/17/2018   x2   CATARACT EXTRACTION W/ INTRAOCULAR LENS  IMPLANT, BILATERAL  2015   COLONOSCOPY  05/14/2015   CYSTO WITH HYDRODISTENSION N/A 04/14/2018   Procedure: CYSTOSCOPY/HYDRODISTENSION WITH INSTILLATION OF PYRIDIUM AND MARCAINE, BLADDER BIOPSY WITH FULGERATION 0.5 TO 2 CM;  Surgeon: Jerilee Field, MD;  Location: [facility];  Service: Urology;  Laterality: N/A;   ELBOW DEBRIDEMENT Right 09/10/2010   and tendon debridement and radial tunnel release   EXCISION MORTON'S NEUROMA Bilateral left 02-20-2009/  right & left 05-08-2009   EXCISION VAGINAL CYST N/A 07/01/2016   Procedure: EXCISION VAGINAL CYST;  Surgeon: Ranae Pila, MD;  Location: [facility];  Service: Gynecology;  Laterality: N/A;   LEFT ATRIAL APPENDAGE OCCLUSION N/A 06/11/2021   Procedure: LEFT ATRIAL APPENDAGE OCCLUSION;  Surgeon: Lanier Prude, MD;  Location: [facility];  Service: Cardiovascular;  Laterality: N/A;   PLANTAR FASCIA RELEASE Bilateral left 04-02-2010/  right 04-02-2016   TEE WITHOUT CARDIOVERSION N/A 06/11/2021   Procedure: TRANSESOPHAGEAL ECHOCARDIOGRAM (TEE);  Surgeon: Lanier Prude, MD;  Location: [facility];  Service: Cardiovascular;  Laterality: N/A;   TONSILLECTOMY AND ADENOIDECTOMY  child   TOTAL ABDOMINAL HYSTERECTOMY  1974   w/  Left salpingoophorectomy and Partial right salpingoophorectomy   Patient Active Problem List   Diagnosis Date Noted   Presence of Watchman left atrial appendage closure device 06/12/2021   Atrial fibrillation (HCC) 06/11/2021   Prolapse of female genital organs 07/09/2020   Candidiasis 12/19/2019   Chronic pain of both shoulders 11/28/2019   Eustachian tube dysfunction, bilateral 11/05/2019   Secondary hypercoagulable state (HCC) 03/08/2019   Malignant neoplasm of lower-inner quadrant of left breast in female, estrogen receptor positive (HCC) 07/26/2018   Anxiety about health  05/31/2018   Paroxysmal atrial fibrillation (HCC) 05/18/2018   Overweight (BMI 25.0-29.9) 05/18/2018   Systolic murmur 07/14/2017   Shingles 11/23/2016   Arthritis 05/26/2016   Insomnia 05/26/2016   Neuropathy 05/26/2016   Positive ANA (antinuclear antibody) 05/26/2016   Hyperlipidemia 05/26/2016   Neck and shoulder pain 01/26/2014   Cervicalgia 01/26/2014   METATARSALGIA 05/06/2010   PLANTAR FASCIITIS, LEFT 05/06/2010   FOOT PAIN, BILATERAL 05/06/2010    PCP: Neena Rhymes MD  REFERRING PROVIDER: Romero Belling MD  REFERRING DIAG: bil glute medius tendonopathy, history of glute tear and right ischial bursa pain  THERAPY DIAG:  Hip pain; weakness Rationale for Evaluation and Treatment: Rehabilitation  ONSET DATE: 03/15/22  SUBJECTIVE:   SUBJECTIVE STATEMENT: Saw Dr. Mikal Plane and he confirmed spinal stenosis but did not feel surgery was not needed.  Going to go through with the spinal stimulator trial of 7/22.  Going to ask Dr. Maurice Small for lumbar referral.  I haven't been too bad but I feel too much.  Left hip much better.  New MRI:   "progressive lumbar disc and facet degeneration.  Severe spinal stenosis and mild to moderate right neural foraminal stenosis at L4-5, mild to moderate left lateral recess and left neural foraminal stenosis L1-2"  PERTINENT HISTORY: Several injections in bursa on left over the years, fewer injections on right.  Just finished a Round of prednisone for 6 days helped but the left hip pain has returned.  Tore right glute from yardwork and fell twice years ago and took months to heal.  1 month ago got on recumbent bike at work for 27 min and strained right glute again (ischial area); I don't feel as stable   Does chair yoga; does water exercise on her own at [employer] Walking on the treadmill Resolved A-fib; newly referred to pain clinic for spinal cord stimulator to address peripheral neuropathy in feet;  Chronic lumbar radiculopathy; peripheral  neuropathy in feet; cervical fusion Bil knee OA Lots of PT over the year "I didn't get anything out of it"  PAIN:  PAIN:  Are you having pain? Yes NPRS scale: 3-4/10 on right and left hips and ITB Pain location: right ischial tuberosity; left lateral hip trochanteric bursa Aggravating factors: bending down;  sudden movement; sidelying; sitting in the car Relieving factors: walking does not ; movement PRECAUTIONS: None  WEIGHT BEARING RESTRICTIONS: No  FALLS:  Has patient fallen in last 6 months? No   OCCUPATION: working 12 hour shifts  PLOF: Independent  PATIENT GOALS: I told the doctor I didn't need to come but I wanted to do the exercises properly and try the DN (mostly here for the DN)   OBJECTIVE:     PATIENT SURVEYS:  FOTO 65%  COGNITION: Overall cognitive status: Within functional limits for tasks assessed      MUSCLE LENGTH: Hamstrings: Right 70 deg; Left 75 deg Decreased hip flexor length: extension to 5 degrees bil  POSTURE: pelvic drop with attempted  SLS <3 sec right/left  PALPATION: Tender points left lateral glutes; tender points right proximal HS at ischial tuberosity; tender bil ITB  LOWER EXTREMITY ROM: Bil knee flexion 125 degrees LOWER EXTREMITY MMT:  MMT Right eval Left eval  Hip flexion 4 4  Hip extension 4- 4-  Hip abduction 3- 3-  Hip adduction    Hip internal rotation    Hip external rotation 4- 4-  Knee flexion 4- 4  Knee extension 4 4  Ankle dorsiflexion    Ankle plantarflexion    Ankle inversion    Ankle eversion     (Blank rows = not tested)  TRUNK STRENGTH:  Decreased activation of transverse abdominus muscles; abdominals 4-/5; decreased activation of lumbar multifidi; trunk extensors 4-/5  FUNCTIONAL TESTS:  5x sit to stand no hands 14.02 sec 6/11:  9.51 sec  GAIT:  Comments: pelvic drop, lateral trunk lean   TODAY'S TREATMENT:     DATE: 6/19 Patient education on proximal HS tendinopathy:  avoid straight leg  bending over with gardening (bend knees), sit with feet resting on floor instead of extended out, avoid extreme hip flexed positions 5# prone HS curls 10x Manual therapy: soft tissue mobilization to bil gluteals, bil ITB Trigger Point Dry-Needling  Treatment instructions: Expect mild to moderate muscle soreness. S/S of pneumothorax if dry needled over a lung field, and to seek immediate medical attention should they occur. Patient verbalized understanding of these instructions and education.  Patient Consent Given: Yes Education handout provided: Yes Muscles treated:sidelying bil gluteals and bil ITB in sidelying Electrical stimulation performed: no Parameters: N/A Treatment response/outcome: improved soft tissue mobility  DATE: 6/11 Patient education on MRI results: stenosis, age related changes Lifestyle modifications for stenosis Progress toward goals 5x sit to stand Manual therapy: soft tissue mobilization to bil gluteals, bil ITB Trigger Point Dry-Needling  Treatment instructions: Expect mild to moderate muscle soreness. S/S of pneumothorax if dry needled over a lung field, and to seek immediate medical attention should they occur. Patient verbalized understanding of these instructions and education.  Patient Consent Given: Yes Education handout provided: Yes Muscles treated:sidelying bil gluteals and bil ITB Electrical stimulation performed: yes Parameters: 1.0 ma 8 min bilaterally: bil ITB and bil gluteals in prone Treatment response/outcome: improved soft tissue mobility  DATE: 5/29 Manual therapy: soft tissue mobilization to bil gluteals, bil ITB Trigger Point Dry-Needling  Treatment instructions: Expect mild to moderate muscle soreness. S/S of pneumothorax if dry needled over a lung field, and to seek immediate medical attention should they occur. Patient verbalized understanding of these instructions and education.  Patient Consent Given: Yes Education handout provided:  Yes Muscles treated:sidelying bil gluteals and bil ITB Electrical stimulation performed: yes Parameters: 1.0 ma 8 min bilaterally: bil ITB in prone Treatment response/outcome: improved soft tissue mobility   PATIENT EDUCATION:  Education details: DN info per patient instructions Person educated: Patient Education method: Explanation Education comprehension: verbalized understanding  HOME EXERCISE PROGRAM:  Access Code: [email or web address] URL: [email or web address] Date: 08/05/2022 Prepared by: Lavinia Sharps  Exercises - Clamshell  - 1 x daily - 7 x weekly - 1 sets - 10 reps - Seated Hip Abduction with Resistance  - 1 x daily - 7 x weekly - 1 sets - 10 reps - Hip Flexor Stretch on Step  - 1 x daily - 7 x weekly - 1 sets - 10 reps - Standing Hip Extension with Leg Bent and Support  - 1 x daily - 7 x weekly - 1 sets -  10 reps  ASSESSMENT:  CLINICAL IMPRESSION: Overall fewer tender points in gluteals and ITB since start of care.  Patient education on proximal HS tendinopathy and avoidance of compression strategies.  Therapist monitoring response to all interventions and modifying treatment accordingly. Will assess progress toward goals and anticipate new referral for the addition of lumbar spine eval and treatment to plan of care.         OBJECTIVE IMPAIRMENTS: decreased activity tolerance, decreased strength, increased fascial restrictions, impaired perceived functional ability, increased muscle spasms, and pain.   ACTIVITY LIMITATIONS: lifting, bending, sitting, squatting, sleeping, stairs, dressing, hygiene/grooming, and locomotion level  PARTICIPATION LIMITATIONS: meal prep, cleaning, laundry, driving, shopping, community activity, and occupation  PERSONAL FACTORS: Past/current experiences, Time since onset of injury/illness/exacerbation, and 3+ comorbidities: multi joint OA, peripheral neuropathy in both feet, chronic back pain  are also affecting patient's functional  outcome.   REHAB POTENTIAL: Good  CLINICAL DECISION MAKING: moderate EVALUATION COMPLEXITY: moderate   GOALS: Goals reviewed with patient? Yes  SHORT TERM GOALS: Target date: 08/19/2022   The patient will demonstrate knowledge of basic self care strategies and exercises to promote healing   Baseline: Goal status: met 5/29  2.  The patient will report a 30% improvement in pain levels with functional activities which are currently difficult including bending, sidelying, sitting in the car Baseline:  Goal status: partially met 20%  3.  The patient will have improved hip strength to at least 4-/5 needed for standing, walking longer distances and descending stairs at home and in the community  Baseline:  Goal status: met 6/11  4.  Improved LE strength with 5x sit to stand time improved to <13 sec Baseline:  Goal status: met 6/11  LONG TERM GOALS: Target date: 09/16/2022    The patient will be independent in a safe self progression of a home exercise program to promote further recovery of function   Baseline:  Goal status: INITIAL  2.  The patient will report a 60% improvement in pain levels with functional activities which are currently difficult including bending, sidelying,sitting in the car Baseline:  Goal status: INITIAL  3.  The patient will have improved hip strength to at least 4/5 needed for standing, walking longer distances and descending stairs at home and in the community  Baseline:  Goal status: INITIAL    4.  The patient will have improved FOTO score to   68%     indicating improved function with less pain  Baseline:  Goal status: INITIAL     PLAN:  PT FREQUENCY: 1x/week  PT DURATION: 8 weeks  PLANNED INTERVENTIONS: Therapeutic exercises, Therapeutic activity, Neuromuscular re-education, Balance training, Gait training, Patient/Family education, Self Care, Joint mobilization, Aquatic Therapy, Dry Needling, Cryotherapy, Moist heat, Taping, Ultrasound,  Ionotophoresis 4mg /ml Dexamethasone, Manual therapy, and Re-evaluation  PLAN FOR NEXT SESSION: ERO next visit to add lumbar (pt plans to bring referral); FOTO; DN with ES until spinal cord stimulator 7/22; low level gluteal and core ex's; patient education on stenosis;   add prone HS curls with 5# to HEP; decompression series  Lavinia Sharps, PT 09/01/22 11:22 AM Phone: [phone] Fax: [phone]

## 2022-09-07 DIAGNOSIS — M545 Low back pain, unspecified: Secondary | ICD-10-CM | POA: Diagnosis not present

## 2022-09-10 ENCOUNTER — Ambulatory Visit: Payer: Medicare Other | Admitting: Physical Therapy

## 2022-09-10 DIAGNOSIS — M6281 Muscle weakness (generalized): Secondary | ICD-10-CM | POA: Diagnosis not present

## 2022-09-10 DIAGNOSIS — M25551 Pain in right hip: Secondary | ICD-10-CM | POA: Diagnosis not present

## 2022-09-10 DIAGNOSIS — M25552 Pain in left hip: Secondary | ICD-10-CM | POA: Diagnosis not present

## 2022-09-10 DIAGNOSIS — M5459 Other low back pain: Secondary | ICD-10-CM | POA: Diagnosis not present

## 2022-09-10 NOTE — Therapy (Signed)
OUTPATIENT PHYSICAL THERAPY LOWER EXTREMITY PROGRESS NOTE/RECERTIFICATION   Patient Name: Lorraine [name] MRN: [id number] DOB:May 27, 1950, 72 y.o., female Today's Date: 09/10/2022      PT End of Session - 09/10/22 0845     Visit Number 7    Date for PT Re-Evaluation 12/03/22    Authorization Type Medicare    PT Start Time 0843    PT Stop Time 0928    PT Time Calculation (min) 45 min    Activity Tolerance Patient tolerated treatment well               Past Medical History:  Diagnosis Date   Anxiety    Aortic atherosclerosis (HCC) 02/03/2018   Noted on CT Abd/Pelvis   AR (aortic regurgitation) 07/18/2017   Mild, noted on ECHO   Bilateral cataracts    Bilateral plantar fasciitis    Chronic knee pain    Chronic low back pain    Chronic lumbar radiculopathy 07/14/2017   Mild, L5, noted on electromyography   History of palpitations    2014-- low risk exercise tolerence test, no ischemi, PVC's (09-06-2012)   HSV-1 (herpes simplex virus 1) infection    Cold sore   Hx of colonic polyps    Hyperlipidemia    Insomnia    Lactose intolerance    Left nephrolithiasis 02/03/2018   Nonobstructing, noted on CT Abd/pelvis   Morton's metatarsalgia, neuralgia, or neuroma, bilateral    Osteoarthritis    knees and lumbar, feet   Osteopenia    Peripheral neuropathy    bilateral feet -- burning and stinging   PONV (postoperative nausea and vomiting)    severe   Presence of Watchman left atrial appendage closure device 06/11/2021   Watchman 27mm FLX with Dr. Lalla Brothers   PVC's (premature ventricular contractions)    Systolic murmur    "Slight"   Vaginal cyst    Wears glasses    Past Surgical History:  Procedure Laterality Date   ANTERIOR CERVICAL DECOMP/DISCECTOMY FUSION  01/2014   C4 -- C6   ATRIAL FIBRILLATION ABLATION N/A 04/09/2021   Procedure: ATRIAL FIBRILLATION ABLATION;  Surgeon: Lanier Prude, MD;  Location: [facility];  Service: Cardiovascular;   Laterality: N/A;   BREAST BIOPSY Left 07/17/2018   x2   CATARACT EXTRACTION W/ INTRAOCULAR LENS  IMPLANT, BILATERAL  2015   COLONOSCOPY  05/14/2015   CYSTO WITH HYDRODISTENSION N/A 04/14/2018   Procedure: CYSTOSCOPY/HYDRODISTENSION WITH INSTILLATION OF PYRIDIUM AND MARCAINE, BLADDER BIOPSY WITH FULGERATION 0.5 TO 2 CM;  Surgeon: Jerilee Field, MD;  Location: [facility];  Service: Urology;  Laterality: N/A;   ELBOW DEBRIDEMENT Right 09/10/2010   and tendon debridement and radial tunnel release   EXCISION MORTON'S NEUROMA Bilateral left 02-20-2009/  right & left 05-08-2009   EXCISION VAGINAL CYST N/A 07/01/2016   Procedure: EXCISION VAGINAL CYST;  Surgeon: Ranae Pila, MD;  Location: [facility];  Service: Gynecology;  Laterality: N/A;   LEFT ATRIAL APPENDAGE OCCLUSION N/A 06/11/2021   Procedure: LEFT ATRIAL APPENDAGE OCCLUSION;  Surgeon: Lanier Prude, MD;  Location: [facility];  Service: Cardiovascular;  Laterality: N/A;   PLANTAR FASCIA RELEASE Bilateral left 04-02-2010/  right 04-02-2016   TEE WITHOUT CARDIOVERSION N/A 06/11/2021   Procedure: TRANSESOPHAGEAL ECHOCARDIOGRAM (TEE);  Surgeon: Lanier Prude, MD;  Location[facility];  Service: Cardiovascular;  Laterality: N/A;   TONSILLECTOMY AND ADENOIDECTOMY  child   TOTAL ABDOMINAL HYSTERECTOMY  1974   w/  Left salpingoophorectomy and Partial right salpingoophorectomy   Patient Active Problem List   Diagnosis Date Noted   Presence of Watchman left atrial appendage closure device 06/12/2021   Atrial fibrillation (HCC) 06/11/2021   Prolapse of female genital organs 07/09/2020   Candidiasis 12/19/2019   Chronic pain of both shoulders 11/28/2019   Eustachian tube dysfunction, bilateral 11/05/2019   Secondary hypercoagulable state (HCC) 03/08/2019   Malignant neoplasm of lower-inner quadrant of left breast in female, estrogen receptor positive (HCC) 07/26/2018   Anxiety  about health 05/31/2018   Paroxysmal atrial fibrillation (HCC) 05/18/2018   Overweight (BMI 25.0-29.9) 05/18/2018   Systolic murmur 07/14/2017   Shingles 11/23/2016   Arthritis 05/26/2016   Insomnia 05/26/2016   Neuropathy 05/26/2016   Positive ANA (antinuclear antibody) 05/26/2016   Hyperlipidemia 05/26/2016   Neck and shoulder pain 01/26/2014   Cervicalgia 01/26/2014   METATARSALGIA 05/06/2010   PLANTAR FASCIITIS, LEFT 05/06/2010   FOOT PAIN, BILATERAL 05/06/2010    PCP: Neena Rhymes MD  REFERRING PROVIDER: Romero Belling MD  REFERRING DIAG: bil glute medius tendonopathy, history of glute tear and right ischial bursa pain  THERAPY DIAG:  Hip pain; weakness Rationale for Evaluation and Treatment: Rehabilitation  ONSET DATE: 03/15/22  SUBJECTIVE:   SUBJECTIVE STATEMENT: Saw Dr. Maurice Small and got lumbar referral. Had steroid injection on Monday which has helped.  I've been doing my ex's.  Ordered a leg weight 4# was good, 5# was a little too hard.    New MRI:   "progressive lumbar disc and facet degeneration.  Severe spinal stenosis and mild to moderate right neural foraminal stenosis at L4-5, mild to moderate left lateral recess and left neural foraminal stenosis L1-2"  PERTINENT HISTORY: Several injections in bursa on left over the years, fewer injections on right.  Just finished a Round of prednisone for 6 days helped but the left hip pain has returned.  Tore right glute from yardwork and fell twice years ago and took months to heal.  1 month ago got on recumbent bike at work for 27 min and strained right glute again (ischial area); I don't feel as stable   Does chair yoga; does water exercise on her own at [employer] Walking on the treadmill Resolved A-fib; newly referred to pain clinic for spinal cord stimulator to address peripheral neuropathy in feet;  Chronic lumbar radiculopathy; peripheral neuropathy in feet; cervical fusion Bil knee OA Lots of PT over the year  "I didn't get anything out of it"  PAIN:  PAIN:  Are you having pain? Yes NPRS scale: 2-3/10 on right and left hips and ITB Pain location: right ischial tuberosity; left lateral hip trochanteric bursa Aggravating factors: bending down;  sudden movement; sidelying; sitting in the car Relieving factors: walking does not ; movement PRECAUTIONS: None  WEIGHT BEARING RESTRICTIONS: No  FALLS:  Has patient fallen in last 6 months? No   OCCUPATION: working 12 hour shifts  PLOF: Independent  PATIENT GOALS: I told the doctor I didn't need to come but I wanted to do the exercises properly and try the DN (mostly here for the DN)   OBJECTIVE:     PATIENT SURVEYS:  FOTO 65% 6/28  69% goal met taken out of FOTO  COGNITION: Overall cognitive status: Within functional limits for tasks assessed      MUSCLE LENGTH: Hamstrings: Right 70 deg; Left 75 deg Decreased hip flexor length: extension to 5 degrees bil  POSTURE: pelvic drop with attempted SLS <3 sec right/left  PALPATION: Tender  points left lateral glutes; tender points right proximal HS at ischial tuberosity; tender bil ITB  LOWER EXTREMITY ROM: Bil knee flexion 125 degrees  LUMBAR ROM;    LOWER EXTREMITY MMT:  MMT Right eval Left eval 6/28  Hip flexion 4 4 4   Hip extension 4- 4- 4  Hip abduction 3- 3- 4-  Hip adduction     Hip internal rotation     Hip external rotation 4- 4- 4  Knee flexion 4- 4 4  Knee extension 4 4 4   Ankle dorsiflexion     Ankle plantarflexion     Ankle inversion     Ankle eversion      (Blank rows = not tested)  6/28; TRUNK STRENGTH:  Decreased activation of transverse abdominus muscles; abdominals 4-/5; decreased activation of lumbar multifidi; trunk extensors 4-/5  FUNCTIONAL TESTS:  5x sit to stand no hands 14.02 sec 6/11:  9.51 sec  GAIT:  Comments: pelvic drop, lateral trunk lean   TODAY'S TREATMENT:     DATE: 6/28 Lumbar ROM Review of progress toward goals and goal  setting Discussion on clarifying activity restrictions post stimulator implant (want to maintain as much as possible to avoid atrophy) Manual therapy: soft tissue mobilization to bil gluteals, bil ITB Trigger Point Dry-Needling  Treatment instructions: Expect mild to moderate muscle soreness. S/S of pneumothorax if dry needled over a lung field, and to seek immediate medical attention should they occur. Patient verbalized understanding of these instructions and education.  Patient Consent Given: Yes Education handout provided: Yes Muscles treated:bil lumbar multifidi, bil gluteals and bil ITB prone Electrical stimulation performed:yes bil lumbar multifid and bil gluteals Parameters:8 min 1.5 ma Treatment response/outcome: improved soft tissue mobility  DATE: 6/19 Patient education on proximal HS tendinopathy:  avoid straight leg bending over with gardening (bend knees), sit with feet resting on floor instead of extended out, avoid extreme hip flexed positions 5# prone HS curls 10x Manual therapy: soft tissue mobilization to bil gluteals, bil ITB Trigger Point Dry-Needling  Treatment instructions: Expect mild to moderate muscle soreness. S/S of pneumothorax if dry needled over a lung field, and to seek immediate medical attention should they occur. Patient verbalized understanding of these instructions and education.  Patient Consent Given: Yes Education handout provided: Yes Muscles treated:sidelying bil gluteals and bil ITB in sidelying Electrical stimulation performed: no Parameters: N/A Treatment response/outcome: improved soft tissue mobility  DATE: 6/11 Patient education on MRI results: stenosis, age related changes Lifestyle modifications for stenosis Progress toward goals 5x sit to stand Manual therapy: soft tissue mobilization to bil gluteals, bil ITB Trigger Point Dry-Needling  Treatment instructions: Expect mild to moderate muscle soreness. S/S of pneumothorax if dry  needled over a lung field, and to seek immediate medical attention should they occur. Patient verbalized understanding of these instructions and education.  Patient Consent Given: Yes Education handout provided: Yes Muscles treated:sidelying bil gluteals and bil ITB Electrical stimulation performed: yes Parameters: 1.0 ma 8 min bilaterally: bil ITB and bil gluteals in prone Treatment response/outcome: improved soft tissue mobility   PATIENT EDUCATION:  Education details: DN info per patient instructions Person educated: Patient Education method: Explanation Education comprehension: verbalized understanding  HOME EXERCISE PROGRAM:  Access Code: [email or web address] URL: [email or web address] Date: 08/05/2022 Prepared by: Lavinia Sharps  Exercises - Clamshell  - 1 x daily - 7 x weekly - 1 sets - 10 reps - Seated Hip Abduction with Resistance  - 1 x daily - 7 x weekly - 1 sets - 10  reps - Hip Flexor Stretch on Step  - 1 x daily - 7 x weekly - 1 sets - 10 reps - Standing Hip Extension with Leg Bent and Support  - 1 x daily - 7 x weekly - 1 sets - 10 reps  ASSESSMENT:  CLINICAL IMPRESSION: The patient is progressing with improvements in FOTO functional outcome score meeting LTG.  LE strength improving as well but would benefit from a further progression needed for gardening and heavier household chores.  Lumbar referral received and will implement core strengthening and address myofascial issues in lumbar musculature.  She responds well to DN in conjunction with exercise, manual therapy, modalities and patient education.  The patient would benefit from a continuation of skilled PT for a further progression of strengthening and functional mobility.  Will continue to update and promote independence in a HEP needed for a return to the highest functional level possible with ADLs.          OBJECTIVE IMPAIRMENTS: decreased activity tolerance, decreased strength, increased fascial  restrictions, impaired perceived functional ability, increased muscle spasms, and pain.   ACTIVITY LIMITATIONS: lifting, bending, sitting, squatting, sleeping, stairs, dressing, hygiene/grooming, and locomotion level  PARTICIPATION LIMITATIONS: meal prep, cleaning, laundry, driving, shopping, community activity, and occupation  PERSONAL FACTORS: Past/current experiences, Time since onset of injury/illness/exacerbation, and 3+ comorbidities: multi joint OA, peripheral neuropathy in both feet, chronic back pain  are also affecting patient's functional outcome.   REHAB POTENTIAL: Good  CLINICAL DECISION MAKING: moderate EVALUATION COMPLEXITY: moderate   GOALS: Goals reviewed with patient? Yes  SHORT TERM GOALS: Target date: 08/19/2022   The patient will demonstrate knowledge of basic self care strategies and exercises to promote healing   Baseline: Goal status: met 5/29  2.  The patient will report a 30% improvement in pain levels with functional activities which are currently difficult including bending, sidelying, sitting in the car Baseline:  Goal status: partially met 20%  3.  The patient will have improved hip strength to at least 4-/5 needed for standing, walking longer distances and descending stairs at home and in the community  Baseline:  Goal status: met 6/11  4.  Improved LE strength with 5x sit to stand time improved to <13 sec Baseline:  Goal status: met 6/11  LONG TERM GOALS: Target date: 11/20/2022    The patient will be independent in a safe self progression of a home exercise program to promote further recovery of function   Baseline:  Goal status: ongoing  2.  The patient will report a 60% improvement in pain levels with functional activities which are currently difficult including bending, sidelying,sitting in the car Baseline:   6/28: 40-50% better  Goal status: ongoing 3.  The patient will have improved hip strength to at least 4/5 needed for standing,  walking longer distances and descending stairs at home and in the community  Baseline:  Goal status: ongoing    4.  The patient will have improved FOTO score to   68%     indicating improved function with less pain  Baseline:  Goal status: met 6/28  5. Able to walk >2 miles with pain level <5/10 new  6.  The patient will have improved trunk flexor and extensor muscle strength to at least 4+/5 needed for gardening, picking up limbs, using riding mower, stooping/bending new        PLAN:  PT FREQUENCY: 1x/week  PT DURATION: 12 weeks  PLANNED INTERVENTIONS: Therapeutic exercises, Therapeutic activity, Neuromuscular re-education,  Balance training, Gait training, Patient/Family education, Self Care, Joint mobilization, Aquatic Therapy, Dry Needling, Cryotherapy, Moist heat, Taping, Ultrasound, Ionotophoresis 4mg /ml Dexamethasone, Manual therapy, and Re-evaluation  PLAN FOR NEXT SESSION: DN with ES until spinal cord stimulator 7/22; low level gluteal and core ex's; patient education on stenosis;   add prone HS curls with 5# to HEP; decompression series; squatting/sit to stand; step ups for attic ladder   Lavinia Sharps, PT 09/10/22 2:01 PM Phone: [phone] Fax: [phone]

## 2022-09-14 NOTE — Progress Notes (Signed)
Patient Care Team: Sheliah Hatch, MD as PCP - General (Family Medicine) Rennis Golden Lisette Abu, MD as PCP - Cardiology (Cardiology) Lanier Prude, MD as PCP - Electrophysiology (Cardiology) Elon Spanner, Madelaine Etienne, MD as Consulting Physician (Obstetrics and Gynecology) Elinor Parkinson, [place] as Consulting Physician (Podiatry) Donnetta Hail, MD as Consulting Physician (Rheumatology) Sheral Apley, MD as Attending Physician (Orthopedic Surgery) Chrystie Nose, MD as Consulting Physician (Cardiology)  DIAGNOSIS:  Encounter Diagnosis  Name Primary?   Malignant neoplasm of lower-inner quadrant of left breast in female, estrogen receptor positive (HCC) Yes    SUMMARY OF ONCOLOGIC HISTORY: Oncology History  Malignant neoplasm of lower-inner quadrant of left breast in female, estrogen receptor positive (HCC)  07/26/2018 Initial Diagnosis   Screening mammogram detected a 10cm span of calcifications in the left breast, biopsy confirmed intermediate grade DCIS, ER 100%, PR 100%.    07/26/2018 Cancer Staging   Staging form: Breast, AJCC 8th Edition - Clinical: Stage 0 (cTis (DCIS), cN0, cM0, ER+, PR+) - Signed by Loa Socks, NP on 07/26/2018   03/20/2019 - 10/2019 Anti-estrogen oral therapy   Tamoxifen, 5mg  daily     CHIEF COMPLIANT: Follow-up of left breast DCIS on comet clinical trial anastrozole surveillance   INTERVAL HISTORY: Lorraine [name] is a 72 y.o. with above-mentioned history of left breast DCIS currently on the COMET clinical trial, randomized to the active surveillance arm. She presents to the clinic today for follow-up. Pt reports that she is tolerating the anastrozole fairly well. She does have neuropathy in both feet. States that she still having hip pain.   ALLERGIES:  is allergic to amitriptyline, tamoxifen, codeine, and nortriptyline.  MEDICATIONS:  Current Outpatient Medications  Medication Sig Dispense Refill   ALPRAZolam (XANAX) 0.5 MG tablet  Take 1 tablet (0.5 mg total) by mouth 2 (two) times daily as needed for anxiety. 60 tablet 1   anastrozole (ARIMIDEX) 1 MG tablet Take 1 tablet (1 mg total) by mouth daily. 90 tablet 3   cyanocobalamin (VITAMIN B12) 1000 MCG/ML injection Inject 1 mL (1,000 mcg total) into the muscle once a week. 30 mL 3   ezetimibe (ZETIA) 10 MG tablet Take 1 tablet (10 mg total) by mouth daily. 90 tablet 3   gabapentin (NEURONTIN) 100 MG capsule TAKE ONE CAPSULE BY MOUTH THREE TIMES A DAY (Patient taking differently: as needed. TAKE ONE CAPSULE BY MOUTH THREE TIMES A DAY) 90 capsule 0   gabapentin (NEURONTIN) 300 MG capsule TAKE 1 CAPSULE BY MOUTH 3 TIMES A DAY 90 capsule 0   meloxicam (MOBIC) 15 MG tablet Take 0.5 tablets (7.5 mg total) by mouth daily. 90 tablet 1   polyvinyl alcohol (LIQUIFILM TEARS) 1.4 % ophthalmic solution Place 1 drop into both eyes as needed for dry eyes.     rosuvastatin (CRESTOR) 5 MG tablet Take 1 tablet (5 mg total) by mouth daily. 90 tablet 3   valACYclovir (VALTREX) 1000 MG tablet Take 1 tablet (1,000 mg total) by mouth daily. 30 tablet 5   zolpidem (AMBIEN) 10 MG tablet Take 1 tablet (10 mg total) by mouth at bedtime. 30 tablet 3   No current facility-administered medications for this visit.    PHYSICAL EXAMINATION: ECOG PERFORMANCE STATUS: 1 - Symptomatic but completely ambulatory  Vitals:   09/21/22 0811  BP: (!) 140/78  Pulse: 65  Resp: 18  Temp: 97.7 F (36.5 C)  SpO2: 97%   Filed Weights   09/21/22 0811  Weight: 177 lb 11.2  oz (80.6 kg)     LABORATORY DATA:  I have reviewed the data as listed    Latest Ref Rng & Units 11/17/2021    8:48 AM 07/08/2021    8:54 AM 06/12/2021   12:45 AM  CMP  Glucose 70 - 99 mg/dL 96  93  161   BUN 8 - 27 mg/dL 29  28  28    Creatinine 0.57 - 1.00 mg/dL 0.96  0.45  4.09   Sodium 134 - 144 mmol/L 141  143  140   Potassium 3.5 - 5.2 mmol/L 4.3  4.6  4.1   Chloride 96 - 106 mmol/L 105  108  110   CO2 20 - 29 mmol/L 19  17  23     Calcium 8.7 - 10.3 mg/dL 9.5  9.7  9.1   Total Protein 6.0 - 8.5 g/dL 6.4     Total Bilirubin 0.0 - 1.2 mg/dL 0.5     Alkaline Phos 44 - 121 IU/L 95     AST 0 - 40 IU/L 15     ALT 0 - 32 IU/L 17       Lab Results  Component Value Date   WBC 8.9 11/17/2021   HGB 14.1 11/17/2021   HCT 43.5 11/17/2021   MCV 85 11/17/2021   PLT 278 11/17/2021   NEUTROABS 4.3 11/17/2021    ASSESSMENT & PLAN:  Malignant neoplasm of lower-inner quadrant of left breast in female, estrogen receptor positive (HCC) 07/17/2018: Screening mammogram detected a 10cm span of calcifications in the left breast, biopsy confirmed intermediate grade DCIS, ER 100%, PR 100% Stage 0   Current treatment:  1. COMET clinical trial randomized to active surveillance arm 2. Tamoxifen 20 mg daily started 09/20/2018 stopped 11/01/2018.  Vaginal dryness (Mona Misty Stanley treatment 11/16/2018) restarted tamoxifen 5 mg 03/30/2019 discontinued tamoxifen in August 2021. 3.  Starting anastrozole 09/11/2021 (because the mammogram showed increasing calcifications)   Breast cancer surveillance: Bilateral mammogram  09/03/2021: Mild increase in left breast calcifications span 8.9 by 7.3 x 4 cm previously it was 9 x 5.4 x 4.3 cm.  We will check on the clinical trials protocol if there is any change in the management required.  I do not intend to biopsy for the slight change.   Left mammogram 03/12/2022: Stable appearance of left breast calcifications 6.7 x 4.5 x 8.1 cm (previously 8.9 x 4 x 7.4 cm)   Peripheral neuropathy: Unclear etiology.  Takes B12 injections at bariatric office.    Anastrozole Toxicities: Tolerating it extremely well without any problems or concerns.  She does have bilateral hip discomfort but she has had it even before starting anastrozole therapy. Patient is keen on stopping anastrozole therapy at the end of 5 years.   Breast cancer surveillance: Breast exam 09/21/2022: Benign Mammogram 09/15/2022: Stable appearance of left breast  calcifications spanning 8 cm. MRI lumbar spine 08/19/2022: Disc and facet degeneration, severe spinal stenosis L4-L5 Bone density 08/10/2022 at physicians: Osteopenia: Recommended calcium and vitamin D in addition to weightbearing exercises.  For spinal stenosis she is planning to undergo spinal stimulator implant on a clinical study.  Return to clinic in 6 months after another mammogram and follow-up    Orders Placed This Encounter  Procedures   MM DIAG BREAST TOMO UNI LEFT    Standing Status:   Future    Standing Expiration Date:   09/21/2023    Order Specific Question:   Reason for Exam (SYMPTOM  OR DIAGNOSIS REQUIRED)  Answer:   surveillance    Order Specific Question:   Preferred imaging location?    Answer:   [facility]   The patient has a good understanding of the overall plan. she agrees with it. she will call with any problems that may develop before the next visit here. Total time spent: 30 mins including face to face time and time spent for planning, charting and co-ordination of care   Tamsen Meek, MD 09/21/22    I Janan Ridge am acting as a Neurosurgeon for [employer]  I have reviewed the above documentation for accuracy and completeness, and I agree with the above.

## 2022-09-15 ENCOUNTER — Ambulatory Visit
Admission: RE | Admit: 2022-09-15 | Discharge: 2022-09-15 | Disposition: A | Discharge status: Home / Self Care | Payer: Medicare Other | Source: Ambulatory Visit | Attending: Hematology and Oncology | Admitting: Hematology and Oncology

## 2022-09-15 DIAGNOSIS — R921 Mammographic calcification found on diagnostic imaging of breast: Secondary | ICD-10-CM | POA: Diagnosis not present

## 2022-09-15 DIAGNOSIS — D0512 Intraductal carcinoma in situ of left breast: Secondary | ICD-10-CM

## 2022-09-20 DIAGNOSIS — M7061 Trochanteric bursitis, right hip: Secondary | ICD-10-CM | POA: Diagnosis not present

## 2022-09-20 DIAGNOSIS — M7062 Trochanteric bursitis, left hip: Secondary | ICD-10-CM | POA: Diagnosis not present

## 2022-09-21 ENCOUNTER — Encounter: Payer: Self-pay | Admitting: Hematology and Oncology

## 2022-09-21 ENCOUNTER — Encounter: Payer: Self-pay | Admitting: *Deleted

## 2022-09-21 ENCOUNTER — Inpatient Hospital Stay: Payer: Medicare Other | Attending: Hematology and Oncology | Admitting: Hematology and Oncology

## 2022-09-21 ENCOUNTER — Other Ambulatory Visit: Payer: Self-pay

## 2022-09-21 VITALS — BP 140/78 | HR 65 | Temp 97.7°F | Resp 18 | Ht 65.0 in | Wt 177.7 lb

## 2022-09-21 DIAGNOSIS — M48061 Spinal stenosis, lumbar region without neurogenic claudication: Secondary | ICD-10-CM | POA: Diagnosis not present

## 2022-09-21 DIAGNOSIS — Z79811 Long term (current) use of aromatase inhibitors: Secondary | ICD-10-CM | POA: Diagnosis not present

## 2022-09-21 DIAGNOSIS — Z006 Encounter for examination for normal comparison and control in clinical research program: Secondary | ICD-10-CM | POA: Diagnosis not present

## 2022-09-21 DIAGNOSIS — C50312 Malignant neoplasm of lower-inner quadrant of left female breast: Secondary | ICD-10-CM | POA: Diagnosis not present

## 2022-09-21 DIAGNOSIS — Z170 Estrogen receptor positive status [ER+]: Secondary | ICD-10-CM

## 2022-09-21 DIAGNOSIS — M8580 Other specified disorders of bone density and structure, unspecified site: Secondary | ICD-10-CM | POA: Insufficient documentation

## 2022-09-21 DIAGNOSIS — G629 Polyneuropathy, unspecified: Secondary | ICD-10-CM | POA: Diagnosis not present

## 2022-09-21 NOTE — Research (Signed)
AFT - 25: COMPARING AN OPERATION TO MONITORING, WITH OR WITHOUT ENDOCRINE THERAPY (COMET) FOR LOW RISK DCIS: A PHASE III PROSPECTIVE RANDOMIZED TRIAL    Patient arrives today unaccompanied for her 48 month study visit. She reports that she is doing all of her usual activities.     PROs: The pt's online questionnaires were completed on 08/15/22.       LABS: No labs were required today.   MEDICATION REVIEW: Patient reviewed her current medication list.   VITAL SIGNS: Vital signs are collected per study protocol.   MD/PROVIDER VISIT: Patient was seen by Dr Pamelia Hoit for today's visit.  All of the pt's questions/issues were addressed.     ADVERSE EVENTS: Patient Lorraine [name] reports AEs as below. Attribution by Dr Pamelia Hoit. All of her AE's were deemed "unrelated" to any COMET intervention including her prescribed anastrozole.     ADVERSE EVENT Lorraine [name] [id number]   09/21/2022- Adverse Event Log   Study/Protocol: AFT- 25/ COMET study   Cycle: 48 month visit   Event Grade Attribution   Date resolved Comments  Arthralgia 2 Unrelated   Ongoing Baseline comorbidity of arthritis. Unchanged. Pt takes Mobic. Recent injections   Myalgia 1 Unrelated   Ongoing Pt reports mild leg pain that does not limit her ADLs.   Osteopenia 1 Unrelated   Ongoing "Osteopenia", per patient report. Most recent bone density 08/10/22.    Peripheral neuropathy; sensory 2 Unrelated   Ongoing Moderate pain. Pt states that she has had "foot issues for years" that do not limit her ADLs. Pt takes Gabapentin, and vitamin B12.  Pt is scheduled for a trial spine stimulator on 10/04/22.  Pt reports this will be an outpatient procedure.   Pt denies any Covid infections during this reporting period.   Pt denies the following Solicited AE's:  Allergic reaction, fever, hot flashes, hypertension, nausea, and fracture.     DISPOSITION: Upon completion off all study requirements, patient was take to the scheduling desk and her  next study visit, month 54, was scheduled for 03/25/2023.   The patient was thanked for their time and continued voluntary participation in this study. Patient Lorraine [name] has been provided direct contact information and is encouraged to contact this Nurse for any needs or questions.  Janan Ridge RN, BSN, CCRP Clinical Research Nurse Lead 09/21/2022 10:41 AM

## 2022-09-21 NOTE — Assessment & Plan Note (Addendum)
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 COMET clinical trial randomized to active surveillance arm 2. Tamoxifen 20 mg daily started 09/20/2018 stopped 11/01/2018.  Vaginal dryness (Mona Misty Stanley treatment 11/16/2018) restarted tamoxifen 5 mg 03/30/2019 discontinued tamoxifen in August 2021. 3.  Starting anastrozole 09/11/2021 (because the mammogram showed increasing calcifications)   Breast cancer surveillance: Bilateral mammogram  09/03/2021: Mild increase in left breast calcifications span 8.9 by 7.3 x 4 cm previously it was 9 x 5.4 x 4.3 cm.  We will check on the clinical trials protocol if there is any change in the management required.  I do not intend to biopsy for the slight change.   Left mammogram 03/12/2022: Stable appearance of left breast calcifications 6.7 x 4.5 x 8.1 cm (previously 8.9 x 4 x 7.4 cm)   Peripheral neuropathy: Unclear etiology.  Takes B12 injections at bariatric office.    Anastrozole Toxicities: Tolerating it extremely well without any problems or concerns.  She does have bilateral hip discomfort but she has had it even before starting anastrozole therapy.   Breast cancer surveillance: Breast exam 09/21/2022: Benign Mammogram 09/15/2022: Stable appearance of left breast calcifications spanning 8 cm. MRI lumbar spine 08/19/2022: Disc and facet degeneration, severe spinal stenosis L4-L5 Bone density 08/10/2022 at physicians: Osteopenia  Return to clinic in 6 months after another mammogram and follow-up

## 2022-09-29 ENCOUNTER — Encounter: Payer: Self-pay | Admitting: Physical Therapy

## 2022-09-29 ENCOUNTER — Ambulatory Visit: Payer: Medicare Other | Attending: Physical Medicine and Rehabilitation | Admitting: Physical Therapy

## 2022-09-29 DIAGNOSIS — M25552 Pain in left hip: Secondary | ICD-10-CM | POA: Insufficient documentation

## 2022-09-29 DIAGNOSIS — M25551 Pain in right hip: Secondary | ICD-10-CM | POA: Insufficient documentation

## 2022-09-29 DIAGNOSIS — M6281 Muscle weakness (generalized): Secondary | ICD-10-CM | POA: Diagnosis not present

## 2022-09-29 NOTE — Therapy (Signed)
OUTPATIENT PHYSICAL THERAPY LOWER EXTREMITY PROGRESS NOTE   Patient Name: Lorraine [name] MRN: [id number] DOB:September 21, 1950, 72 y.o., female Today's Date: 09/29/2022      PT End of Session - 09/29/22 0753     Visit Number 8    Date for PT Re-Evaluation 12/03/22    Authorization Type Medicare    PT Start Time 0755    PT Stop Time 0840    PT Time Calculation (min) 45 min    Activity Tolerance Patient tolerated treatment well               Past Medical History:  Diagnosis Date   Anxiety    Aortic atherosclerosis (HCC) 02/03/2018   Noted on CT Abd/Pelvis   AR (aortic regurgitation) 07/18/2017   Mild, noted on ECHO   Bilateral cataracts    Bilateral plantar fasciitis    Chronic knee pain    Chronic low back pain    Chronic lumbar radiculopathy 07/14/2017   Mild, L5, noted on electromyography   History of palpitations    2014-- low risk exercise tolerence test, no ischemi, PVC's (09-06-2012)   HSV-1 (herpes simplex virus 1) infection    Cold sore   Hx of colonic polyps    Hyperlipidemia    Insomnia    Lactose intolerance    Left nephrolithiasis 02/03/2018   Nonobstructing, noted on CT Abd/pelvis   Morton's metatarsalgia, neuralgia, or neuroma, bilateral    Osteoarthritis    knees and lumbar, feet   Osteopenia    Peripheral neuropathy    bilateral feet -- burning and stinging   PONV (postoperative nausea and vomiting)    severe   Presence of Watchman left atrial appendage closure device 06/11/2021   Watchman 27mm FLX with Dr. Lalla Brothers   PVC's (premature ventricular contractions)    Systolic murmur    "Slight"   Vaginal cyst    Wears glasses    Past Surgical History:  Procedure Laterality Date   ANTERIOR CERVICAL DECOMP/DISCECTOMY FUSION  01/2014   C4 -- C6   ATRIAL FIBRILLATION ABLATION N/A 04/09/2021   Procedure: ATRIAL FIBRILLATION ABLATION;  Surgeon: Lanier Prude, MD;  Location: [facility];  Service: Cardiovascular;  Laterality: N/A;    BREAST BIOPSY Left 07/17/2018   x2   CATARACT EXTRACTION W/ INTRAOCULAR LENS  IMPLANT, BILATERAL  2015   COLONOSCOPY  05/14/2015   CYSTO WITH HYDRODISTENSION N/A 04/14/2018   Procedure: CYSTOSCOPY/HYDRODISTENSION WITH INSTILLATION OF PYRIDIUM AND MARCAINE, BLADDER BIOPSY WITH FULGERATION 0.5 TO 2 CM;  Surgeon: Jerilee Field, MD;  Location: [facility];  Service: Urology;  Laterality: N/A;   ELBOW DEBRIDEMENT Right 09/10/2010   and tendon debridement and radial tunnel release   EXCISION MORTON'S NEUROMA Bilateral left 02-20-2009/  right & left 05-08-2009   EXCISION VAGINAL CYST N/A 07/01/2016   Procedure: EXCISION VAGINAL CYST;  Surgeon: Ranae Pila, MD;  Location: [facility];  Service: Gynecology;  Laterality: N/A;   LEFT ATRIAL APPENDAGE OCCLUSION N/A 06/11/2021   Procedure: LEFT ATRIAL APPENDAGE OCCLUSION;  Surgeon: Lanier Prude, MD;  Location: [facility];  Service: Cardiovascular;  Laterality: N/A;   PLANTAR FASCIA RELEASE Bilateral left 04-02-2010/  right 04-02-2016   TEE WITHOUT CARDIOVERSION N/A 06/11/2021   Procedure: TRANSESOPHAGEAL ECHOCARDIOGRAM (TEE);  Surgeon: Lanier Prude, MD;  Location: [facility] IN[facility];  Service: Cardiovascular;  Laterality: N/A;   TONSILLECTOMY AND ADENOIDECTOMY  child   TOTAL ABDOMINAL HYSTERECTOMY  1974   w/  Left salpingoophorectomy and Partial right salpingoophorectomy   Patient Active Problem List   Diagnosis Date Noted   Presence of Watchman left atrial appendage closure device 06/12/2021   Atrial fibrillation (HCC) 06/11/2021   Prolapse of female genital organs 07/09/2020   Candidiasis 12/19/2019   Chronic pain of both shoulders 11/28/2019   Eustachian tube dysfunction, bilateral 11/05/2019   Secondary hypercoagulable state (HCC) 03/08/2019   Malignant neoplasm of lower-inner quadrant of left breast in female, estrogen receptor positive (HCC) 07/26/2018   Anxiety about health  05/31/2018   Paroxysmal atrial fibrillation (HCC) 05/18/2018   Overweight (BMI 25.0-29.9) 05/18/2018   Systolic murmur 07/14/2017   Shingles 11/23/2016   Arthritis 05/26/2016   Insomnia 05/26/2016   Neuropathy 05/26/2016   Positive ANA (antinuclear antibody) 05/26/2016   Hyperlipidemia 05/26/2016   Neck and shoulder pain 01/26/2014   Cervicalgia 01/26/2014   METATARSALGIA 05/06/2010   PLANTAR FASCIITIS, LEFT 05/06/2010   FOOT PAIN, BILATERAL 05/06/2010    PCP: Neena Rhymes MD  REFERRING PROVIDER: Romero Belling MD  REFERRING DIAG: bil glute medius tendonopathy, history of glute tear and right ischial bursa pain  THERAPY DIAG:  Hip pain; weakness Rationale for Evaluation and Treatment: Rehabilitation  ONSET DATE: 03/15/22  SUBJECTIVE:   SUBJECTIVE STATEMENT: I think the steroid in my back has helped and a steroid in left bursa has helped.  The strain on right side is much improved.  I'm careful with that.  Getting stimulator trial on Monday 7/22.  Unsure of immediate response to DN/ES but I did some yardwork that night and the next day and I wasn't sore! I think I'm really coming along.  We've turned a corner.    New MRI:   "progressive lumbar disc and facet degeneration.  Severe spinal stenosis and mild to moderate right neural foraminal stenosis at L4-5, mild to moderate left lateral recess and left neural foraminal stenosis L1-2"  PERTINENT HISTORY: Several injections in bursa on left over the years, fewer injections on right.  Just finished a Round of prednisone for 6 days helped but the left hip pain has returned.  Tore right glute from yardwork and fell twice years ago and took months to heal.  1 month ago got on recumbent bike at work for 27 min and strained right glute again (ischial area); I don't feel as stable   Does chair yoga; does water exercise on her own at [employer] Walking on the treadmill Resolved A-fib; newly referred to pain clinic for spinal cord  stimulator to address peripheral neuropathy in feet;  Chronic lumbar radiculopathy; peripheral neuropathy in feet; cervical fusion Bil knee OA Lots of PT over the year "I didn't get anything out of it"  PAIN:  PAIN:  Are you having pain? Yes NPRS scale: 2/10 left > right hips and ITBs; LBP Pain location: right ischial tuberosity; left lateral hip trochanteric bursa Aggravating factors: bending down;  sudden movement; sidelying; sitting in the car Relieving factors: walking does not ; movement PRECAUTIONS: None  WEIGHT BEARING RESTRICTIONS: No  FALLS:  Has patient fallen in last 6 months? No   OCCUPATION: working 12 hour shifts  PLOF: Independent  PATIENT GOALS: I told the doctor I didn't need to come but I wanted to do the exercises properly and try the DN (mostly here for the DN)   OBJECTIVE:     PATIENT SURVEYS:  FOTO 65% 6/28  69% goal met taken out of FOTO  COGNITION: Overall cognitive status: Within functional limits for tasks assessed  MUSCLE LENGTH: Hamstrings: Right 70 deg; Left 75 deg Decreased hip flexor length: extension to 5 degrees bil  POSTURE: pelvic drop with attempted SLS <3 sec right/left  PALPATION: Tender points left lateral glutes; tender points right proximal HS at ischial tuberosity; tender bil ITB  LOWER EXTREMITY ROM: Bil knee flexion 125 degrees  LUMBAR ROM;    LOWER EXTREMITY MMT:  MMT Right eval Left eval 6/28  Hip flexion 4 4 4   Hip extension 4- 4- 4  Hip abduction 3- 3- 4-  Hip adduction     Hip internal rotation     Hip external rotation 4- 4- 4  Knee flexion 4- 4 4  Knee extension 4 4 4   Ankle dorsiflexion     Ankle plantarflexion     Ankle inversion     Ankle eversion      (Blank rows = not tested)  6/28; TRUNK STRENGTH:  Decreased activation of transverse abdominus muscles; abdominals 4-/5; decreased activation of lumbar multifidi; trunk extensors 4-/5  FUNCTIONAL TESTS:  5x sit to stand no hands 14.02  sec 6/11:  9.51 sec  GAIT:  Comments: pelvic drop, lateral trunk lean   TODAY'S TREATMENT:  DATE: 7/17 Discussed factors that could restrain HS: on bike (seat close, limit time initially); avoid straight leg bending over; overstretching HS  Discussed optimal ex's for bone density and muscle strengthening targeting quads: sit to stands, step ups Manual therapy: soft tissue mobilization to bil gluteals, bil ITB Trigger Point Dry-Needling  Treatment instructions: Expect mild to moderate muscle soreness. S/S of pneumothorax if dry needled over a lung field, and to seek immediate medical attention should they occur. Patient verbalized understanding of these instructions and education.  Patient Consent Given: Yes Education handout provided: Yes Muscles treated:bil lumbar multifidi, bil gluteals and bil ITB prone Electrical stimulation performed:yes bil lumbar multifid and bil gluteals Parameters:8 min 1.5 ma Treatment response/outcome: improved soft tissue mobility     DATE: 6/28 Lumbar ROM Review of progress toward goals and goal setting Discussion on clarifying activity restrictions post stimulator implant (want to maintain as much as possible to avoid atrophy) Manual therapy: soft tissue mobilization to bil gluteals, bil ITB Trigger Point Dry-Needling  Treatment instructions: Expect mild to moderate muscle soreness. S/S of pneumothorax if dry needled over a lung field, and to seek immediate medical attention should they occur. Patient verbalized understanding of these instructions and education.  Patient Consent Given: Yes Education handout provided: Yes Muscles treated:bil lumbar multifidi, bil gluteals and bil ITB prone Electrical stimulation performed:yes bil lumbar multifid and bil gluteals Parameters:8 min 1.5 ma Treatment response/outcome: improved soft tissue mobility  DATE: 6/19 Patient education on proximal HS tendinopathy:  avoid straight leg bending over with gardening  (bend knees), sit with feet resting on floor instead of extended out, avoid extreme hip flexed positions 5# prone HS curls 10x Manual therapy: soft tissue mobilization to bil gluteals, bil ITB Trigger Point Dry-Needling  Treatment instructions: Expect mild to moderate muscle soreness. S/S of pneumothorax if dry needled over a lung field, and to seek immediate medical attention should they occur. Patient verbalized understanding of these instructions and education.  Patient Consent Given: Yes Education handout provided: Yes Muscles treated:sidelying bil gluteals and bil ITB in sidelying Electrical stimulation performed: no Parameters: N/A Treatment response/outcome: improved soft tissue mobility  PATIENT EDUCATION:  Education details: DN info per patient instructions Person educated: Patient Education method: Explanation Education comprehension: verbalized understanding  HOME EXERCISE PROGRAM:  Access Code: [email or web address] URL:  [email or web address] Date: 08/05/2022 Prepared by: Lavinia Sharps  Exercises - Clamshell  - 1 x daily - 7 x weekly - 1 sets - 10 reps - Seated Hip Abduction with Resistance  - 1 x daily - 7 x weekly - 1 sets - 10 reps - Hip Flexor Stretch on Step  - 1 x daily - 7 x weekly - 1 sets - 10 reps - Standing Hip Extension with Leg Bent and Support  - 1 x daily - 7 x weekly - 1 sets - 10 reps  ASSESSMENT:  CLINICAL IMPRESSION: Pt reports continued improvement in symptom intensity.  She continues to ex in the pool and do some yardwork.  She is concerned about re-irritating right HS and we discussed potential aggravating factors and which ex's would build strength and tissue resiliency.  Good response to DN with decreased size and number of tender points in lumbar and hip/thigh musculature.  May need to modify treatment interventions following spinal cord stimulator implant.       OBJECTIVE IMPAIRMENTS: decreased activity tolerance, decreased strength,  increased fascial restrictions, impaired perceived functional ability, increased muscle spasms, and pain.   ACTIVITY LIMITATIONS: lifting, bending, sitting, squatting, sleeping, stairs, dressing, hygiene/grooming, and locomotion level  PARTICIPATION LIMITATIONS: meal prep, cleaning, laundry, driving, shopping, community activity, and occupation  PERSONAL FACTORS: Past/current experiences, Time since onset of injury/illness/exacerbation, and 3+ comorbidities: multi joint OA, peripheral neuropathy in both feet, chronic back pain  are also affecting patient's functional outcome.   REHAB POTENTIAL: Good  CLINICAL DECISION MAKING: moderate EVALUATION COMPLEXITY: moderate   GOALS: Goals reviewed with patient? Yes  SHORT TERM GOALS: Target date: 08/19/2022   The patient will demonstrate knowledge of basic self care strategies and exercises to promote healing   Baseline: Goal status: met 5/29  2.  The patient will report a 30% improvement in pain levels with functional activities which are currently difficult including bending, sidelying, sitting in the car Baseline:  Goal status: partially met 20%  3.  The patient will have improved hip strength to at least 4-/5 needed for standing, walking longer distances and descending stairs at home and in the community  Baseline:  Goal status: met 6/11  4.  Improved LE strength with 5x sit to stand time improved to <13 sec Baseline:  Goal status: met 6/11  LONG TERM GOALS: Target date: 11/20/2022    The patient will be independent in a safe self progression of a home exercise program to promote further recovery of function   Baseline:  Goal status: ongoing  2.  The patient will report a 60% improvement in pain levels with functional activities which are currently difficult including bending, sidelying,sitting in the car Baseline:   6/28: 40-50% better  Goal status: ongoing 3.  The patient will have improved hip strength to at least 4/5  needed for standing, walking longer distances and descending stairs at home and in the community  Baseline:  Goal status: ongoing    4.  The patient will have improved FOTO score to   68%     indicating improved function with less pain  Baseline:  Goal status: met 6/28  5. Able to walk >2 miles with pain level <5/10 new  6.  The patient will have improved trunk flexor and extensor muscle strength to at least 4+/5 needed for gardening, picking up limbs, using riding mower, stooping/bending new        PLAN:  PT FREQUENCY: 1x/week  PT DURATION: 12 weeks  PLANNED  INTERVENTIONS: Therapeutic exercises, Therapeutic activity, Neuromuscular re-education, Balance training, Gait training, Patient/Family education, Self Care, Joint mobilization, Aquatic Therapy, Dry Needling, Cryotherapy, Moist heat, Taping, Ultrasound, Ionotophoresis 4mg /ml Dexamethasone, Manual therapy, and Re-evaluation  PLAN FOR NEXT SESSION: DN; spinal cord stimulator 7/22; low level gluteal and core ex's; patient education on stenosis;   prone HS curls with 5# to HEP; decompression series; squatting/sit to stand; step ups for attic ladder   Lavinia Sharps, PT 09/29/22 7:04 PM Phone: [phone] Fax: [phone]

## 2022-09-30 ENCOUNTER — Encounter: Payer: Self-pay | Admitting: Family Medicine

## 2022-09-30 ENCOUNTER — Ambulatory Visit (INDEPENDENT_AMBULATORY_CARE_PROVIDER_SITE_OTHER): Payer: Medicare Other | Admitting: Family Medicine

## 2022-09-30 VITALS — BP 138/80 | HR 64 | Temp 97.9°F | Ht 65.0 in | Wt 184.4 lb

## 2022-09-30 DIAGNOSIS — I480 Paroxysmal atrial fibrillation: Secondary | ICD-10-CM | POA: Diagnosis not present

## 2022-09-30 DIAGNOSIS — R531 Weakness: Secondary | ICD-10-CM

## 2022-09-30 DIAGNOSIS — M791 Myalgia, unspecified site: Secondary | ICD-10-CM

## 2022-09-30 DIAGNOSIS — R350 Frequency of micturition: Secondary | ICD-10-CM

## 2022-09-30 DIAGNOSIS — R5381 Other malaise: Secondary | ICD-10-CM | POA: Diagnosis not present

## 2022-09-30 LAB — CBC
HCT: 43.6 % (ref 36.0–46.0)
Hemoglobin: 14.1 g/dL (ref 12.0–15.0)
MCHC: 32.4 g/dL (ref 30.0–36.0)
MCV: 86.7 fl (ref 78.0–100.0)
Platelets: 279 10*3/uL (ref 150.0–400.0)
RBC: 5.02 Mil/uL (ref 3.87–5.11)
RDW: 15.6 % — ABNORMAL HIGH (ref 11.5–15.5)
WBC: 13.6 10*3/uL — ABNORMAL HIGH (ref 4.0–10.5)

## 2022-09-30 LAB — COMPREHENSIVE METABOLIC PANEL
ALT: 17 U/L (ref 0–35)
AST: 16 U/L (ref 0–37)
Albumin: 4.3 g/dL (ref 3.5–5.2)
Alkaline Phosphatase: 87 U/L (ref 39–117)
BUN: 32 mg/dL — ABNORMAL HIGH (ref 6–23)
CO2: 26 mEq/L (ref 19–32)
Calcium: 9.7 mg/dL (ref 8.4–10.5)
Chloride: 106 mEq/L (ref 96–112)
Creatinine, Ser: 0.68 mg/dL (ref 0.40–1.20)
GFR: 87.11 mL/min (ref >60.00)
Glucose, Bld: 103 mg/dL — ABNORMAL HIGH (ref 70–99)
Potassium: 4.6 mEq/L (ref 3.5–5.1)
Sodium: 139 mEq/L (ref 135–145)
Total Bilirubin: 0.8 mg/dL (ref 0.2–1.2)
Total Protein: 6.8 g/dL (ref 6.0–8.3)

## 2022-09-30 LAB — POCT URINALYSIS DIP (MANUAL ENTRY)
Bilirubin, UA: NEGATIVE
Blood, UA: NEGATIVE
Glucose, UA: NEGATIVE mg/dL
Ketones, POC UA: NEGATIVE mg/dL
Leukocytes, UA: NEGATIVE
Nitrite, UA: NEGATIVE
Protein Ur, POC: NEGATIVE mg/dL
Spec Grav, UA: >=1.03 — AB (ref 1.010–1.025)
Urobilinogen, UA: 0.2 E.U./dL
pH, UA: 6 (ref 5.0–8.0)

## 2022-09-30 LAB — GLUCOSE, POCT (MANUAL RESULT ENTRY): POC Glucose: 97 mg/dl (ref 70–99)

## 2022-09-30 LAB — SEDIMENTATION RATE: Sed Rate: 7 mm/hr (ref 0–30)

## 2022-09-30 LAB — TSH: TSH: 2.23 u[IU]/mL (ref 0.35–5.50)

## 2022-09-30 NOTE — Progress Notes (Signed)
Subjective:  Patient ID: Lorraine [name], female    DOB: 02-03-1951  Age: 72 y.o. MRN: [id number]  CC:  Chief Complaint  Patient presents with   Extremity Weakness    Pt notes got up Friday last week and was extremely weak, had neighbor check BG which was 109 and BP was also okay, pt notes weakness has been consistent not worsening at this time     HPI Lorraine [name] presents for acute visit for above.  PCP Dr. Beverely Low.  Weakness As above, onset July 12 when she woke up. Felt fine the day prior.  Felt extreme weakness in legs and arms. Both sides. Similar symptoms after surgery in the past when not able to eat, but has been eating normally recently - every 2 hours.  Reports normal blood pressure and blood glucose of 109 Sunday.  History of chronic lumbar radiculopathy per problem list, treated with gabapentin for peripheral neuropathy in feet - takes 300mg  at bedtime.  No fever.  No cough, no shortness of breath or chest pain. No facial droop/weakness or change in speech.  Minimal HA a few times - resolved with tylenol. No current HA. No neck pain/stiffness. No photo/phonophobia.  Few palpitations. Hx of afib, s/p ablation and watchman procedure. On ASA 81 mg every day.  No n/v.  No abd pain.  No change in bowel movements.  Initial day had urinary frequency then resolved that day - normal urination since, no hematuria.  Only new treatments recently - hip bursa injections both sides on 7/8. Symptoms started 7/12.  Taking xanax 1/2 pill in am, whole pill at night past few weeks for anxiety.  Off statin past 2-3 weeks - myalgias and muscle weakness in past - usually able to tolerate low dose.  Feels same since last Friday. Few days possible slight improvement, then worsens.  Upcoming plan for spinal stimulator placement this Monday.  Plan for 12 hour shifts this weekend - may call out.    History Patient Active Problem List   Diagnosis Date Noted   Presence of Watchman left atrial  appendage closure device 06/12/2021   Atrial fibrillation (HCC) 06/11/2021   Prolapse of female genital organs 07/09/2020   Candidiasis 12/19/2019   Chronic pain of both shoulders 11/28/2019   Eustachian tube dysfunction, bilateral 11/05/2019   Secondary hypercoagulable state (HCC) 03/08/2019   Malignant neoplasm of lower-inner quadrant of left breast in female, estrogen receptor positive (HCC) 07/26/2018   Anxiety about health 05/31/2018   Paroxysmal atrial fibrillation (HCC) 05/18/2018   Overweight (BMI 25.0-29.9) 05/18/2018   Systolic murmur 07/14/2017   Shingles 11/23/2016   Arthritis 05/26/2016   Insomnia 05/26/2016   Neuropathy 05/26/2016   Positive ANA (antinuclear antibody) 05/26/2016   Hyperlipidemia 05/26/2016   Neck and shoulder pain 01/26/2014   Cervicalgia 01/26/2014   METATARSALGIA 05/06/2010   PLANTAR FASCIITIS, LEFT 05/06/2010   FOOT PAIN, BILATERAL 05/06/2010   Past Medical History:  Diagnosis Date   Anxiety    Aortic atherosclerosis (HCC) 02/03/2018   Noted on CT Abd/Pelvis   AR (aortic regurgitation) 07/18/2017   Mild, noted on ECHO   Bilateral cataracts    Bilateral plantar fasciitis    Chronic knee pain    Chronic low back pain    Chronic lumbar radiculopathy 07/14/2017   Mild, L5, noted on electromyography   History of palpitations    2014-- low risk exercise tolerence test, no ischemi, PVC's (09-06-2012)   HSV-1 (herpes simplex virus 1) infection  Cold sore   Hx of colonic polyps    Hyperlipidemia    Insomnia    Lactose intolerance    Left nephrolithiasis 02/03/2018   Nonobstructing, noted on CT Abd/pelvis   Morton's metatarsalgia, neuralgia, or neuroma, bilateral    Osteoarthritis    knees and lumbar, feet   Osteopenia    Peripheral neuropathy    bilateral feet -- burning and stinging   PONV (postoperative nausea and vomiting)    severe   Presence of Watchman left atrial appendage closure device 06/11/2021   Watchman 27mm FLX with Dr.  Lalla Brothers   PVC's (premature ventricular contractions)    Systolic murmur    "Slight"   Vaginal cyst    Wears glasses    Past Surgical History:  Procedure Laterality Date   ANTERIOR CERVICAL DECOMP/DISCECTOMY FUSION  01/2014   C4 -- C6   ATRIAL FIBRILLATION ABLATION N/A 04/09/2021   Procedure: ATRIAL FIBRILLATION ABLATION;  Surgeon: Lanier Prude, MD;  Location: [facility];  Service: Cardiovascular;  Laterality: N/A;   BREAST BIOPSY Left 07/17/2018   x2   CATARACT EXTRACTION W/ INTRAOCULAR LENS  IMPLANT, BILATERAL  2015   COLONOSCOPY  05/14/2015   CYSTO WITH HYDRODISTENSION N/A 04/14/2018   Procedure: CYSTOSCOPY/HYDRODISTENSION WITH INSTILLATION OF PYRIDIUM AND MARCAINE, BLADDER BIOPSY WITH FULGERATION 0.5 TO 2 CM;  Surgeon: Jerilee Field, MD;  Location: [facility];  Service: Urology;  Laterality: N/A;   ELBOW DEBRIDEMENT Right 09/10/2010   and tendon debridement and radial tunnel release   EXCISION MORTON'S NEUROMA Bilateral left 02-20-2009/  right & left 05-08-2009   EXCISION VAGINAL CYST N/A 07/01/2016   Procedure: EXCISION VAGINAL CYST;  Surgeon: Ranae Pila, MD;  Location: [facility];  Service: Gynecology;  Laterality: N/A;   LEFT ATRIAL APPENDAGE OCCLUSION N/A 06/11/2021   Procedure: LEFT ATRIAL APPENDAGE OCCLUSION;  Surgeon: Lanier Prude, MD;  Location: [facility];  Service: Cardiovascular;  Laterality: N/A;   PLANTAR FASCIA RELEASE Bilateral left 04-02-2010/  right 04-02-2016   TEE WITHOUT CARDIOVERSION N/A 06/11/2021   Procedure: TRANSESOPHAGEAL ECHOCARDIOGRAM (TEE);  Surgeon: Lanier Prude, MD;  Location[facility];  Service: Cardiovascular;  Laterality: N/A;   TONSILLECTOMY AND ADENOIDECTOMY  child   TOTAL ABDOMINAL HYSTERECTOMY  1974   w/  Left salpingoophorectomy and Partial right salpingoophorectomy   Allergies  Allergen Reactions   Amitriptyline Nausea Only    Bladder retention    Tamoxifen    Codeine Nausea And Vomiting and Rash   Nortriptyline Rash    Bladder retention    Prior to Admission medications   Medication Sig Start Date End Date Taking? Authorizing Provider  ALPRAZolam Prudy Feeler) 0.5 MG tablet Take 1 tablet (0.5 mg total) by mouth 2 (two) times daily as needed for anxiety. 08/18/22  Yes Sheliah Hatch, MD  anastrozole (ARIMIDEX) 1 MG tablet Take 1 tablet (1 mg total) by mouth daily. 03/17/22  Yes Serena Croissant, MD  cyanocobalamin (VITAMIN B12) 1000 MCG/ML injection Inject 1 mL (1,000 mcg total) into the muscle once a week. 03/17/22  Yes Serena Croissant, MD  ezetimibe (ZETIA) 10 MG tablet Take 1 tablet (10 mg total) by mouth daily. 11/17/21 11/12/22 Yes Hilty, Lisette Abu, MD  gabapentin (NEURONTIN) 100 MG capsule TAKE ONE CAPSULE BY MOUTH THREE TIMES A DAY Patient taking differently: as needed. TAKE ONE CAPSULE BY MOUTH THREE TIMES A DAY 03/23/22  Yes Hyatt, Max T, DPM  gabapentin (NEURONTIN) 300 MG capsule  TAKE 1 CAPSULE BY MOUTH 3 TIMES A DAY 09/01/22  Yes Hyatt, Max T, DPM  meloxicam (MOBIC) 15 MG tablet Take 0.5 tablets (7.5 mg total) by mouth daily. 12/28/21  Yes Sheliah Hatch, MD  polyvinyl alcohol (LIQUIFILM TEARS) 1.4 % ophthalmic solution Place 1 drop into both eyes as needed for dry eyes.   Yes [provider]  rosuvastatin (CRESTOR) 5 MG tablet Take 1 tablet (5 mg total) by mouth daily. 07/27/22 07/22/23 Yes Hilty, Lisette Abu, MD  valACYclovir (VALTREX) 1000 MG tablet Take 1 tablet (1,000 mg total) by mouth daily. 12/01/21  Yes Sheliah Hatch, MD  zolpidem (AMBIEN) 10 MG tablet Take 1 tablet (10 mg total) by mouth at bedtime. 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  Has one son.  Works as a Science writer for [employer].  Education: some college.    Social Determinants of Health   Financial Resource Strain: Not on file  Food Insecurity: Not on file  Transportation Needs: Not on file  Physical Activity: Not on file  Stress: Not on file  Social Connections: Not on file  Intimate Partner Violence: Not on file    Review of Systems   Objective:   Vitals:   09/30/22 0916  BP: 138/80  Pulse: 64  Temp: 97.9 F (36.6 C)  TempSrc: Temporal  SpO2: 99%  Weight: 184 lb 6.4 oz (83.6 kg)  Height: 5\' 5"  (1.651 m)     Physical Exam Vitals reviewed.  Constitutional:      General: She is not in acute distress.    Appearance: Normal appearance. She is well-developed. She is not ill-appearing, toxic-appearing or diaphoretic.  HENT:     Head: Normocephalic and atraumatic.  Eyes:     Extraocular Movements: Extraocular movements intact.     Right eye: No nystagmus.     Left eye: No nystagmus.     Conjunctiva/sclera: Conjunctivae normal.     Pupils: Pupils are equal, round, and reactive to light.  Neck:     Vascular: No carotid bruit.  Cardiovascular:     Rate and Rhythm: Normal rate and regular rhythm.     Heart sounds: Normal heart sounds.  Pulmonary:     Effort: Pulmonary effort is normal.     Breath sounds: Normal breath sounds.  Abdominal:     General: There is no distension.     Palpations: Abdomen is soft. There is no pulsatile mass.     Tenderness: There is no abdominal tenderness.  Musculoskeletal:     Right lower leg: No edema.     Left lower leg: No edema.  Skin:    General:  Skin is warm and dry.  Neurological:  General: No focal deficit present.     Mental Status: She is alert and oriented to person, place, and time.     GCS: GCS eye subscore is 4. GCS verbal subscore is 5. GCS motor subscore is 6.     Cranial Nerves: No cranial nerve deficit, dysarthria or facial asymmetry.     Motor: No weakness, atrophy or pronator drift.     Coordination: Romberg sign negative. Coordination normal. Finger-Nose-Finger Test and Heel to [facility] Test normal. Rapid alternating movements normal.     Gait: Gait normal.     Comments: Nonfocal neuroexam, no appreciable unilateral weakness, strength grossly intact upper extremities, lower extremities.  Normal speech.  Psychiatric:        Mood and Affect: Mood normal.        Behavior: Behavior normal.    EKG: Sinus rhythm, rate 53, sinus bradycardia.  No apparent acute changes when compared to 07/08/2021, no acute ST or T wave changes appreciated.  54 minutes spent during visit, including chart review, counseling and assimilation of information, exam, discussion of plan, lab review and chart completion.   Results for orders placed or performed in visit on 09/30/22  Comprehensive metabolic panel  Result Value Ref Range   Sodium 139 135 - 145 mEq/L   Potassium 4.6 3.5 - 5.1 mEq/L   Chloride 106 96 - 112 mEq/L   CO2 26 19 - 32 mEq/L   Glucose, Bld 103 (H) 70 - 99 mg/dL   BUN 32 (H) 6 - 23 mg/dL   Creatinine, Ser 2.95 0.40 - 1.20 mg/dL   Total Bilirubin 0.8 0.2 - 1.2 mg/dL   Alkaline Phosphatase 87 39 - 117 U/L   AST 16 0 - 37 U/L   ALT 17 0 - 35 U/L   Total Protein 6.8 6.0 - 8.3 g/dL   Albumin 4.3 3.5 - 5.2 g/dL   GFR 62.13 >08.65 mL/min   Calcium 9.7 8.4 - 10.5 mg/dL  CBC  Result Value Ref Range   WBC 13.6 (H) 4.0 - 10.5 K/uL   RBC 5.02 3.87 - 5.11 Mil/uL   Platelets 279.0 150.0 - 400.0 K/uL   Hemoglobin 14.1 12.0 - 15.0 g/dL   HCT 78.4 69.6 - 29.5 %   MCV 86.7 78.0 - 100.0 fl   MCHC 32.4 30.0 - 36.0 g/dL   RDW 28.4  (H) 13.2 - 15.5 %  TSH  Result Value Ref Range   TSH 2.23 0.35 - 5.50 uIU/mL  Sedimentation rate  Result Value Ref Range   Sed Rate 7 0 - 30 mm/hr  POCT urinalysis dipstick  Result Value Ref Range   Color, UA yellow yellow   Clarity, UA clear clear   Glucose, UA negative negative mg/dL   Bilirubin, UA negative negative   Ketones, POC UA negative negative mg/dL   Spec Grav, UA >=4.401 (A) 1.010 - 1.025   Blood, UA negative negative   pH, UA 6.0 5.0 - 8.0   Protein Ur, POC negative negative mg/dL   Urobilinogen, UA 0.2 0.2 or 1.0 E.U./dL   Nitrite, UA Negative Negative   Leukocytes, UA Negative Negative  POCT glucose (manual entry)  Result Value Ref Range   POC Glucose 97 70 - 99 mg/dl       Assessment & Plan:  Lorraine [name] is a 72 y.o. female . Generalized weakness - Plan: Comprehensive metabolic panel, CBC, TSH, EKG 02-VOZD, Sedimentation rate  Myalgia - Plan: CK  Malaise - Plan: Comprehensive metabolic panel,  CBC, TSH, POCT glucose (manual entry), EKG 12-Lead, Sedimentation rate  Urinary frequency - Plan: POCT urinalysis dipstick, POCT glucose (manual entry)  Paroxysmal atrial fibrillation (HCC) - Plan: TSH, EKG 12-Lead  6-day history of generalized fatigue, generalized weakness without focal weakness appreciated on exam.  Nonfocal neurologic exam.  Few episodes of palpitations but no sign of A-fib on in office EKG.  Initial day with urinary frequency but quickly resolved and asymptomatic at present, reassuring in office urinalysis.  Slightly high specific gravity, possible component of volume depletion.  In office glucose normal, reassuring CMP, TSH, sed rate.  Slight leukocytosis but no anemia on CBC, and afebrile.  Deferred neuroimaging or ER evaluation at this time given nonfocal neuroexam, reassuring in office evaluation, and vital signs.  Question heat illness versus volume depletion versus viral illness.  Initial rest, symptomatic care discussed including fluids.   Will check status in next  24 hours, ER precautions were given if any new/worsening symptoms.  Follow-up with myself or her primary care provider in the next 5 days and will forward this note to her PCP.  No orders of the defined types were placed in this encounter.  Patient Instructions  Urine test did not show any sign of infection today.  EKG appears to be stable from previous readings and does not appear that you are in atrial fibrillation at this time.  I will check some other lab work to help decide on next step but for now make sure you are drinking plenty of fluids, rest and if any new or worsening symptoms be seen in the emergency room as we discussed.  I think it would be reasonable to be out of work for this weekend and postpone your procedure on Monday if you are still not feeling well.  Recheck with myself or your primary care provider in the next 5 days.  Weakness Weakness is a lack of strength. You may feel weak all over your body (generalized), or you may feel weak in one part of your body (focal). Common causes of weakness include: Infection and disorders of the body's defense system (immune system). Physical exhaustion. Internal bleeding or other blood loss that results in a lack of red blood cells (anemia). Dehydration. An imbalance in mineral (electrolyte) levels, such as potassium. Chronic kidney or liver disease. Cancer. Other causes include: Some medicines or cancer treatment. Stress, anxiety, or depression. Heart disease, circulation problems, or stroke. Nervous system disorders. Thyroid disorders. Loss of muscle strength because of age or inactivity. Poor sleep quality or sleep disorders. The cause of your weakness may not be known. Some causes of weakness can be serious, so it is important to see your health care provider. Follow these instructions at home: Activity Rest as needed. Try to get enough sleep. Most adults need 7-8 hours of quality sleep each night.  Talk to your health care provider about how much sleep you need. Do exercises, such as arm curls and leg raises, for 30 minutes at least 2 days a week or as told by your health care provider. This helps build muscle strength. Consider working with a physical therapist or trainer who can develop an exercise plan to help you gain muscle strength. General instructions  Take over-the-counter and prescription medicines only as told by your health care provider. Eat a healthy, well-balanced diet. This includes: Proteins to build muscles, such as lean meats and fish. Fresh fruits and vegetables. Carbohydrates to boost energy, such as whole grains. Drink enough fluid to keep  your urine pale yellow. Keep all follow-up visits. This is important. Contact a health care provider if: Your weakness does not improve or gets worse. Your weakness affects your ability to think clearly. Your weakness affects your ability to do your normal daily activities. Get help right away if: You develop sudden weakness, especially on one side of your [name] or body. You have chest pain. You have trouble breathing or shortness of breath. You have problems with your vision. You have trouble talking or swallowing. You have trouble standing or walking. You are light-headed or lose consciousness. These symptoms may be an emergency. Get help right away. Call 911. Do not wait to see if the symptoms will go away. Do not drive yourself to the hospital. Summary Weakness is a lack of strength. You may feel weak all over your body or just in one specific part of your body. Weakness can be caused by a variety of things. In some cases, the cause may be unknown. Rest as needed, and try to get enough sleep. Most adults need 7-8 hours of quality sleep each night. Eat a healthy, well-balanced diet. This information is not intended to replace advice given to you by your health care provider. Make sure you discuss any questions you have  with your health care provider. Document Revised: 02/01/2021 Document Reviewed: 02/01/2021 Elsevier Patient Education  2024 Elsevier Inc.     Signed,   Meredith Staggers, MD [facility], [facility] 09/30/22 5:57 PM

## 2022-09-30 NOTE — Patient Instructions (Addendum)
Urine test did not show any sign of infection today.  EKG appears to be stable from previous readings and does not appear that you are in atrial fibrillation at this time.  I will check some other lab work to help decide on next step but for now make sure you are drinking plenty of fluids, rest and if any new or worsening symptoms be seen in the emergency room as we discussed.  I think it would be reasonable to be out of work for this weekend and postpone your procedure on Monday if you are still not feeling well.  Recheck with myself or your primary care provider in the next 5 days.  Weakness Weakness is a lack of strength. You may feel weak all over your body (generalized), or you may feel weak in one part of your body (focal). Common causes of weakness include: Infection and disorders of the body's defense system (immune system). Physical exhaustion. Internal bleeding or other blood loss that results in a lack of red blood cells (anemia). Dehydration. An imbalance in mineral (electrolyte) levels, such as potassium. Chronic kidney or liver disease. Cancer. Other causes include: Some medicines or cancer treatment. Stress, anxiety, or depression. Heart disease, circulation problems, or stroke. Nervous system disorders. Thyroid disorders. Loss of muscle strength because of age or inactivity. Poor sleep quality or sleep disorders. The cause of your weakness may not be known. Some causes of weakness can be serious, so it is important to see your health care provider. Follow these instructions at home: Activity Rest as needed. Try to get enough sleep. Most adults need 7-8 hours of quality sleep each night. Talk to your health care provider about how much sleep you need. Do exercises, such as arm curls and leg raises, for 30 minutes at least 2 days a week or as told by your health care provider. This helps build muscle strength. Consider working with a physical therapist or trainer who can  develop an exercise plan to help you gain muscle strength. General instructions  Take over-the-counter and prescription medicines only as told by your health care provider. Eat a healthy, well-balanced diet. This includes: Proteins to build muscles, such as lean meats and fish. Fresh fruits and vegetables. Carbohydrates to boost energy, such as whole grains. Drink enough fluid to keep your urine pale yellow. Keep all follow-up visits. This is important. Contact a health care provider if: Your weakness does not improve or gets worse. Your weakness affects your ability to think clearly. Your weakness affects your ability to do your normal daily activities. Get help right away if: You develop sudden weakness, especially on one side of your face or body. You have chest pain. You have trouble breathing or shortness of breath. You have problems with your vision. You have trouble talking or swallowing. You have trouble standing or walking. You are light-headed or lose consciousness. These symptoms may be an emergency. Get help right away. Call 911. Do not wait to see if the symptoms will go away. Do not drive yourself to the hospital. Summary Weakness is a lack of strength. You may feel weak all over your body or just in one specific part of your body. Weakness can be caused by a variety of things. In some cases, the cause may be unknown. Rest as needed, and try to get enough sleep. Most adults need 7-8 hours of quality sleep each night. Eat a healthy, well-balanced diet. This information is not intended to replace advice given to you  by your health care provider. Make sure you discuss any questions you have with your health care provider. Document Revised: 02/01/2021 Document Reviewed: 02/01/2021 Elsevier Patient Education  2024 [employer].

## 2022-10-01 ENCOUNTER — Encounter: Payer: Self-pay | Admitting: Family Medicine

## 2022-10-01 NOTE — Telephone Encounter (Signed)
Called patient. Still feels weak. Able to take shower. No fevers.  No new symptoms.  No cough typically, no chest pain, no dyspnea, no rash, no sore throat. No abd pain.  Did have 2 cortisone shots recently. Could be explanation for elevated WBC. Not feeling as bad as a weak ago, but still weak.   ER eval recommended with current symptoms, significant generalized weakness and to decide on further testing. Understanding expressed.

## 2022-10-03 ENCOUNTER — Emergency Department (HOSPITAL_COMMUNITY)
Admission: EM | Admit: 2022-10-03 | Discharge: 2022-10-03 | Disposition: A | Discharge status: Home / Self Care | Payer: Medicare Other | Attending: Emergency Medicine | Admitting: Emergency Medicine

## 2022-10-03 ENCOUNTER — Other Ambulatory Visit: Payer: Self-pay

## 2022-10-03 ENCOUNTER — Emergency Department (HOSPITAL_COMMUNITY): Payer: Medicare Other

## 2022-10-03 DIAGNOSIS — Z9889 Other specified postprocedural states: Secondary | ICD-10-CM | POA: Insufficient documentation

## 2022-10-03 DIAGNOSIS — E785 Hyperlipidemia, unspecified: Secondary | ICD-10-CM | POA: Insufficient documentation

## 2022-10-03 DIAGNOSIS — Z1152 Encounter for screening for COVID-19: Secondary | ICD-10-CM | POA: Diagnosis not present

## 2022-10-03 DIAGNOSIS — R531 Weakness: Secondary | ICD-10-CM | POA: Diagnosis not present

## 2022-10-03 DIAGNOSIS — I4891 Unspecified atrial fibrillation: Secondary | ICD-10-CM | POA: Diagnosis not present

## 2022-10-03 DIAGNOSIS — R0602 Shortness of breath: Secondary | ICD-10-CM | POA: Diagnosis not present

## 2022-10-03 DIAGNOSIS — R5383 Other fatigue: Secondary | ICD-10-CM | POA: Diagnosis not present

## 2022-10-03 DIAGNOSIS — D72829 Elevated white blood cell count, unspecified: Secondary | ICD-10-CM | POA: Insufficient documentation

## 2022-10-03 LAB — COMPREHENSIVE METABOLIC PANEL
ALT: 22 U/L (ref 0–44)
AST: 19 U/L (ref 15–41)
Albumin: 3.7 g/dL (ref 3.5–5.0)
Alkaline Phosphatase: 88 U/L (ref 38–126)
Anion gap: 10 (ref 5–15)
BUN: 29 mg/dL — ABNORMAL HIGH (ref 8–23)
CO2: 21 mmol/L — ABNORMAL LOW (ref 22–32)
Calcium: 9 mg/dL (ref 8.9–10.3)
Chloride: 107 mmol/L (ref 98–111)
Creatinine, Ser: 0.85 mg/dL (ref 0.44–1.00)
GFR, Estimated: >60 mL/min (ref >60)
Glucose, Bld: 83 mg/dL (ref 70–99)
Potassium: 4.7 mmol/L (ref 3.5–5.1)
Sodium: 138 mmol/L (ref 135–145)
Total Bilirubin: 0.7 mg/dL (ref 0.3–1.2)
Total Protein: 6.4 g/dL — ABNORMAL LOW (ref 6.5–8.1)

## 2022-10-03 LAB — URINALYSIS, ROUTINE W REFLEX MICROSCOPIC
Bacteria, UA: NONE SEEN
Bilirubin Urine: NEGATIVE
Glucose, UA: NEGATIVE mg/dL
Hgb urine dipstick: NEGATIVE
Ketones, ur: NEGATIVE mg/dL
Nitrite: NEGATIVE
Protein, ur: NEGATIVE mg/dL
Specific Gravity, Urine: 1.018 (ref 1.005–1.030)
pH: 5 (ref 5.0–8.0)

## 2022-10-03 LAB — CBC WITH DIFFERENTIAL/PLATELET
Abs Immature Granulocytes: 0.07 10*3/uL (ref 0.00–0.07)
Basophils Absolute: 0.1 10*3/uL (ref 0.0–0.1)
Basophils Relative: 0 %
Eosinophils Absolute: 0.5 10*3/uL (ref 0.0–0.5)
Eosinophils Relative: 4 %
HCT: 43.7 % (ref 36.0–46.0)
Hemoglobin: 13.9 g/dL (ref 12.0–15.0)
Immature Granulocytes: 1 %
Lymphocytes Relative: 23 %
Lymphs Abs: 2.7 10*3/uL (ref 0.7–4.0)
MCH: 28.1 pg (ref 26.0–34.0)
MCHC: 31.8 g/dL (ref 30.0–36.0)
MCV: 88.3 fL (ref 80.0–100.0)
Monocytes Absolute: 0.7 10*3/uL (ref 0.1–1.0)
Monocytes Relative: 6 %
Neutro Abs: 7.9 10*3/uL — ABNORMAL HIGH (ref 1.7–7.7)
Neutrophils Relative %: 66 %
Platelets: 275 10*3/uL (ref 150–400)
RBC: 4.95 MIL/uL (ref 3.87–5.11)
RDW: 16.4 % — ABNORMAL HIGH (ref 11.5–15.5)
WBC: 11.9 10*3/uL — ABNORMAL HIGH (ref 4.0–10.5)
nRBC: 0 % (ref 0.0–0.2)

## 2022-10-03 LAB — RESP PANEL BY RT-PCR (RSV, FLU A&B, COVID)  RVPGX2
Influenza A by PCR: NEGATIVE
Influenza B by PCR: NEGATIVE
Resp Syncytial Virus by PCR: NEGATIVE
SARS Coronavirus 2 by RT PCR: NEGATIVE

## 2022-10-03 LAB — LIPASE, BLOOD: Lipase: 38 U/L (ref 11–51)

## 2022-10-03 LAB — BRAIN NATRIURETIC PEPTIDE: B Natriuretic Peptide: 29.5 pg/mL (ref 0.0–100.0)

## 2022-10-03 LAB — TROPONIN I (HIGH SENSITIVITY): Troponin I (High Sensitivity): 3 ng/L (ref <18)

## 2022-10-03 LAB — PROTIME-INR
INR: 0.9 (ref 0.8–1.2)
Prothrombin Time: 12.5 seconds (ref 11.4–15.2)

## 2022-10-03 LAB — CBG MONITORING, ED: Glucose-Capillary: 82 mg/dL (ref 70–99)

## 2022-10-03 LAB — LACTIC ACID, PLASMA: Lactic Acid, Venous: 0.9 mmol/L (ref 0.5–1.9)

## 2022-10-03 LAB — AMMONIA: Ammonia: 24 umol/L (ref 9–35)

## 2022-10-03 LAB — CK: Total CK: 56 U/L (ref 38–234)

## 2022-10-03 LAB — MAGNESIUM: Magnesium: 2.2 mg/dL (ref 1.7–2.4)

## 2022-10-03 NOTE — Discharge Instructions (Signed)
At this time there does not appear to be the presence of an emergent medical condition, however there is always the potential for conditions to change. Please read and follow the below instructions.  Please return to the Emergency Department immediately for any new or worsening symptoms or if your symptoms do not improve within 3 days. Please be sure to follow up with your Primary Care Provider within one week regarding your visit today; please call their office to schedule an appointment even if you are feeling better for a follow-up visit. Please drink plenty of water and get plenty of rest.   Please read the additional information packets attached to your discharge summary.  Go to the nearest Emergency Department immediately if: You have fever or chills You feel confused, feel like you might faint, or faint. Your vision is blurry or you have a severe headache. You have severe pain in your abdomen, your back, or the area between your waist and hips (pelvis). You have chest pain, shortness of breath, or an irregular or fast heartbeat. You are unable to urinate, or you urinate less than normal. You have abnormal bleeding from the rectum, nose, lungs, nipples, or, if you are female, the vagina. You vomit blood. You have thoughts about hurting yourself or others. You have any new/concerning or worsening of symptoms  Do not take your medicine if  develop an itchy rash, swelling in your mouth or lips, or difficulty breathing; call 911 and seek immediate emergency medical attention if this occurs.  You may review your lab tests and imaging results in their entirety on your MyChart account.  Please discuss all results of fully with your primary care provider and other specialist at your follow-up visit.  Note: Portions of this text may have been transcribed using voice recognition software. Every effort was made to ensure accuracy; however, inadvertent computerized transcription errors may still  be present.

## 2022-10-03 NOTE — ED Notes (Signed)
Patient transported to X-ray 

## 2022-10-03 NOTE — ED Provider Notes (Signed)
Lorraine [name] EMERGENCY DEPARTMENT AT [facility] Provider Note   CSN: [id number] Arrival date & time: 10/03/22  0756     History  Chief Complaint  Patient presents with   Weakness    Lorraine [name] is a 72 y.o. female history includes atrial fibrillation status post ablation, aortic regurg, hyperlipidemia.  Patient presented to the ER for fatigue which has been an ongoing issue for the past 9-10 days.  Patient reports she received a steroid shot a couple days before the symptoms began, no other recent events.  Patient reports that she has noticed fatigue and tiredness to the bilateral upper and lower extremities that generally worsens throughout the day.  She denies any lateral weakness or numbness or tingling.  She reports she saw her primary care provider for this and had lab work performed but no diagnosis was found.  She reports symptoms have continued so she comes into the ER for further evaluation.  She reports occasionally a small amount of shortness of breath and occasionally a frontal headache which both are reported as very mild.  She denies shortness of breath or headache at this time.  She denies any chest pain, pleurisy, history of blood clot, cough/hemoptysis, extremity swelling, calf pain, recent surgery/immobilization, fever, chills, abdominal pain, nausea, vomiting, diarrhea, numbness, tingling, vision changes, recent medication changes or any additional concerns.  HPI     Home Medications Prior to Admission medications   Medication Sig Start Date End Date Taking? Authorizing Provider  ALPRAZolam Prudy Feeler) 0.5 MG tablet Take 1 tablet (0.5 mg total) by mouth 2 (two) times daily as needed for anxiety. 08/18/22   Sheliah Hatch, MD  anastrozole (ARIMIDEX) 1 MG tablet Take 1 tablet (1 mg total) by mouth daily. 03/17/22   Serena Croissant, MD  cyanocobalamin (VITAMIN B12) 1000 MCG/ML injection Inject 1 mL (1,000 mcg total) into the muscle once a week. 03/17/22   Serena Croissant, MD  ezetimibe (ZETIA) 10 MG tablet Take 1 tablet (10 mg total) by mouth daily. 11/17/21 11/12/22  Chrystie Nose, MD  gabapentin (NEURONTIN) 100 MG capsule TAKE ONE CAPSULE BY MOUTH THREE TIMES A DAY Patient taking differently: as needed. TAKE ONE CAPSULE BY MOUTH THREE TIMES A DAY 03/23/22   Hyatt, Max T, DPM  gabapentin (NEURONTIN) 300 MG capsule TAKE 1 CAPSULE BY MOUTH 3 TIMES A DAY 09/01/22   Hyatt, Max T, DPM  meloxicam (MOBIC) 15 MG tablet Take 0.5 tablets (7.5 mg total) by mouth daily. 12/28/21   Sheliah Hatch, MD  polyvinyl alcohol (LIQUIFILM TEARS) 1.4 % ophthalmic solution Place 1 drop into both eyes as needed for dry eyes.    [provider]  rosuvastatin (CRESTOR) 5 MG tablet Take 1 tablet (5 mg total) by mouth daily. 07/27/22 07/22/23  Chrystie Nose, MD  valACYclovir (VALTREX) 1000 MG tablet Take 1 tablet (1,000 mg total) by mouth daily. 12/01/21   Sheliah Hatch, MD  zolpidem (AMBIEN) 10 MG tablet Take 1 tablet (10 mg total) by mouth at bedtime. 06/18/22   Sheliah Hatch, MD      Allergies    Amitriptyline, Tamoxifen, Codeine, and Nortriptyline    Review of Systems   Review of Systems Ten systems are reviewed and are negative for acute change except as noted in the HPI  Physical Exam Updated Vital Signs BP 125/65 (BP Location: Right Arm)   Pulse 60   Temp 98 F (36.7 C) (Oral)   Resp 18   SpO2  100%  Physical Exam Constitutional:      General: She is not in acute distress.    Appearance: Normal appearance. She is well-developed. She is not ill-appearing or diaphoretic.  HENT:     Head: Normocephalic and atraumatic.  Eyes:     General: Vision grossly intact. Gaze aligned appropriately.     Pupils: Pupils are equal, round, and reactive to light.  Neck:     Trachea: Trachea and phonation normal.  Cardiovascular:     Rate and Rhythm: Normal rate and regular rhythm.     Pulses:          Dorsalis pedis pulses are 2+ on the right side and 2+ on  the left side.     Heart sounds: Normal heart sounds.  Pulmonary:     Effort: Pulmonary effort is normal. No respiratory distress.     Breath sounds: Normal breath sounds and air entry.  Abdominal:     General: There is no distension.     Palpations: Abdomen is soft.     Tenderness: There is no abdominal tenderness. There is no guarding or rebound.  Musculoskeletal:        General: Normal range of motion.     Cervical back: Normal range of motion and neck supple.  Skin:    General: Skin is warm and dry.  Neurological:     Mental Status: She is alert.     GCS: GCS eye subscore is 4. GCS verbal subscore is 5. GCS motor subscore is 6.     Comments: Speech is clear and goal oriented, follows commands Major Cranial nerves without deficit, no facial droop Normal strength in upper and lower extremities bilaterally including dorsiflexion and plantar flexion, strong and equal grip strength Sensation normal to light and sharp touch Moves extremities without ataxia, coordination intact Ambulates to bathroom without assistance or difficulty   Psychiatric:        Behavior: Behavior normal.     ED Results / Procedures / Treatments   Labs (all labs ordered are listed, but only abnormal results are displayed) Labs Reviewed  URINALYSIS, ROUTINE W REFLEX MICROSCOPIC - Abnormal; Notable for the following components:      Result Value   Leukocytes,Ua MODERATE (*)    All other components within normal limits  COMPREHENSIVE METABOLIC PANEL - Abnormal; Notable for the following components:   CO2 21 (*)    BUN 29 (*)    Total Protein 6.4 (*)    All other components within normal limits  CBC WITH DIFFERENTIAL/PLATELET - Abnormal; Notable for the following components:   WBC 11.9 (*)    RDW 16.4 (*)    Neutro Abs 7.9 (*)    All other components within normal limits  RESP PANEL BY RT-PCR (RSV, FLU A&B, COVID)  RVPGX2  BRAIN NATRIURETIC PEPTIDE  CK  MAGNESIUM  LIPASE, BLOOD  LACTIC ACID,  PLASMA  AMMONIA  PROTIME-INR  CBG MONITORING, ED  TROPONIN I (HIGH SENSITIVITY)    EKG EKG Interpretation Date/Time:  Sunday October 03 2022 10:11:39 EDT Ventricular Rate:  54 PR Interval:  158 QRS Duration:  91 QT Interval:  417 QTC Calculation: 396 R Axis:   35  Text Interpretation: Sinus rhythm No significant change since last tracing Confirmed by Gwyneth Sprout ([id number]) on 10/03/2022 10:23:25 AM  Radiology DG Chest 2 View  Result Date: 10/03/2022 CLINICAL DATA:  Generalized weakness. EXAM: CHEST - 2 VIEW COMPARISON:  06/11/2021 FINDINGS: The lungs are clear without focal pneumonia, edema,  pneumothorax or pleural effusion. Cardiopericardial silhouette is at upper limits of normal for size. No acute bony abnormality. Telemetry leads overlie the chest. Left atrial appendage occluder device noted on the lateral projection. IMPRESSION: No active cardiopulmonary disease. Electronically Signed   By: Kennith Center M.D.   On: 10/03/2022 08:48    Procedures Procedures    Medications Ordered in ED Medications - No data to display  ED Course/ Medical Decision Making/ A&P Clinical Course as of 10/03/22 1314  Sun Oct 03, 2022  1610 General weakness 9 days.  Minimal shortness of breath, minimal headache.  Recent PCP workup.  Nonfocal neuroexam.  Cardiopulmonary exam unremarkable.  Trace bilateral lower extremity event. [BM]  0931 Resting comfortably [BM]    Clinical Course User Index [BM] Elizabeth Palau                             Medical Decision Making 72 year old female history as above presents for generalized fatigue over the past 1.5 weeks.  No clear inciting event but did have a steroid injection shortly before hand.  No infectious symptoms.  No complaints of chest pain.  She reports occasional mild frontal headache and occasional feeling of shortness of breath/fatigue but that is not currently happening.  She denies any chest pain, pleurisy, hemoptysis, cough,  extremity swelling, calf pain recent surgery or immobilization.  Overall low suspicion for pulmonary embolism at this time.  Cranial nerves intact.  No meningeal signs.  Airway clear.  Intact and equal strength bilateral upper and lower extremities.  No evidence for DVT.  Differential includes but not limited to viral illness, electrolyte derangement, hypoglycemia, ACS, CHF, A-fib, hyperglycemia, pneumonia, UTI.  Will obtain labs along with chest x-ray monitor patient.  She afebrile, no tachycardia, no tachypnea, no hypoxia, no hypotension.  Amount and/or Complexity of Data Reviewed External Data Reviewed: notes.    Details: Patient had PCP encounter on 09/30/2022 for generalized weakness.  Per their note symptom onset was July 12, felt fine the day before.  Weakness and bilateral arms and legs.  No infectious symptoms.  EKG was reassuring in office, some palpitations but no A-fib.  Labs were ordered which include the following.  CBC with mild leukocytosis of 13.6.  No anemia or thrombocytopenia.  CMP without emergent electrolyte derangement, AKI, LFT elevations or gap. Urinalysis without evidence for infection.  Sed rate within normal limits.  TSH within normal limits. Labs: ordered.    Details:  CBC shows leukocytosis of 11.9 which is improving from her office visit earlier this week.  No anemia or thrombocytopenia. Lactic within normal limits Ammonia within normal limits BNP within normal limits Troponin negative, doubt ACS Urinalysis shows moderate leukocytes, no nitrates, no RBCs, WBCs or bacteria, doubt UTI Lipase within normal limits Magnesium within normal limits CK within normal limits, doubt rhabdomyolysis CMP without emergent electrolyte derangement, AKI, LFT elevations or gap INR within normal limits COVID/flu and RSV panel negative Radiology: ordered.    Details: I have personally reviewed and interpreted patient's two-view chest x-ray.  I do not appreciate any obvious PTX, PNA or  effusion.  Please see radiologist interpretation. ECG/medicine tests: ordered and independent interpretation performed.    Details: I have personally reviewed and interpreted patient's twelve-lead EKG.  Shows sinus rhythm rate 54.  I do not appreciate any obvious acute ischemic changes.  Reviewed with Dr. Anitra Lauth.  Risk Risk Details: Workup today is overall reassuring, no clear cause  found for patient's general fatigue.  May be that patient is experiencing a viral illness, possible reaction to steroid injection earlier as well.  Low suspicion for ACS, PE, bacterial infection, CHF, CVA or other emergent process.  Considering no focal symptoms, no active headache, no vision changes do not feel CNS imaging is indicated at this point.  She is overall well-appearing, she is able to ambulate to the bathroom without assistance or difficulty, no indication for further ER workup or for hospital admission.  Will encourage patient to follow-up closely with her PCP for further management.  On reassessment patient is in no acute distress.  Vital signs within normal limits.  Strict ER precautions were given.  All questions were answered. Patient seen and evaulated by Dr. Anitra Lauth during this visit who agrees with workup and discharge at this time.   At this time there does not appear to be any evidence of an acute emergency medical condition and the patient appears stable for discharge with appropriate outpatient follow up. Diagnosis was discussed with patient who verbalizes understanding of care plan and is agreeable to discharge. I have discussed return precautions with patient and her friend at bedside who verbalizes understanding. Patient encouraged to follow-up with their PCP. All questions answered.    Note: Portions of this report may have been transcribed using voice recognition software. Every effort was made to ensure accuracy; however, inadvertent computerized transcription errors may still be  present.         Final Clinical Impression(s) / ED Diagnoses Final diagnoses:  Fatigue, unspecified type    Rx / DC Orders ED Discharge Orders     None         Elizabeth Palau 10/03/22 1333    Gwyneth Sprout, MD 10/04/22 [phone]

## 2022-10-03 NOTE — ED Triage Notes (Signed)
Pt. Stated, I just feel weak. My arms and legs just are just tired and weak. When I stand on my legs I feel like I have to sit down. This started a week ago. It feels like I just had surgery but I have not. Ive had a tiny bit of SOB and a tiny bit of a headache. Mainly weak, strong in the morning weaker.

## 2022-10-03 NOTE — ED Notes (Signed)
Pt ambulated to bathroom with steady gait. 

## 2022-10-03 NOTE — ED Notes (Signed)
Pt received dc papers and verbalized understanding of f/u care. Pt ambulated out of ED w steady gait, a&ox4,vss,nad.

## 2022-10-04 ENCOUNTER — Telehealth: Payer: Self-pay

## 2022-10-04 ENCOUNTER — Other Ambulatory Visit: Payer: Self-pay | Admitting: Podiatry

## 2022-10-04 NOTE — Telephone Encounter (Signed)
Transition Care Management Unsuccessful Follow-up Telephone Call  Date of discharge and from where:  [facility] 7/21  Attempts:  1st Attempt  Reason for unsuccessful TCM follow-up call:  No answer/busy   Lenard Forth [facility] Guide, [facility] [phone] [address] [place], [place], [place] [address] Phone: [phone] Email: [email or web address]

## 2022-10-05 ENCOUNTER — Telehealth: Payer: Self-pay

## 2022-10-05 ENCOUNTER — Ambulatory Visit (INDEPENDENT_AMBULATORY_CARE_PROVIDER_SITE_OTHER): Payer: Medicare Other | Admitting: Family Medicine

## 2022-10-05 ENCOUNTER — Encounter: Payer: Self-pay | Admitting: Family Medicine

## 2022-10-05 VITALS — BP 110/68 | HR 61 | Temp 98.1°F | Resp 17 | Ht 65.0 in | Wt 182.1 lb

## 2022-10-05 DIAGNOSIS — D72829 Elevated white blood cell count, unspecified: Secondary | ICD-10-CM

## 2022-10-05 DIAGNOSIS — R5383 Other fatigue: Secondary | ICD-10-CM | POA: Diagnosis not present

## 2022-10-05 LAB — CBC WITH DIFFERENTIAL/PLATELET
Basophils Absolute: 0.1 10*3/uL (ref 0.0–0.1)
Basophils Relative: 1.2 % (ref 0.0–3.0)
Eosinophils Absolute: 0.4 10*3/uL (ref 0.0–0.7)
Eosinophils Relative: 4 % (ref 0.0–5.0)
HCT: 44.1 % (ref 36.0–46.0)
Hemoglobin: 14.2 g/dL (ref 12.0–15.0)
Lymphocytes Relative: 21.1 % (ref 12.0–46.0)
Lymphs Abs: 2.2 10*3/uL (ref 0.7–4.0)
MCHC: 32.2 g/dL (ref 30.0–36.0)
MCV: 87.6 fl (ref 78.0–100.0)
Monocytes Absolute: 0.6 10*3/uL (ref 0.1–1.0)
Monocytes Relative: 6 % (ref 3.0–12.0)
Neutro Abs: 7.2 10*3/uL (ref 1.4–7.7)
Neutrophils Relative %: 67.7 % (ref 43.0–77.0)
Platelets: 284 10*3/uL (ref 150.0–400.0)
RBC: 5.03 Mil/uL (ref 3.87–5.11)
RDW: 15.9 % — ABNORMAL HIGH (ref 11.5–15.5)
WBC: 10.6 10*3/uL — ABNORMAL HIGH (ref 4.0–10.5)

## 2022-10-05 NOTE — Patient Instructions (Signed)
Follow up as needed or as scheduled Send me a MyChart Message on Friday to let me know how you are feeling We'll notify you of your lab results and make any changes if needed We'll decide about work based on how you feel on Friday Lots of fluids REST!!! Call with any questions or concerns Hang in there!!

## 2022-10-05 NOTE — Telephone Encounter (Signed)
Transition Care Management Unsuccessful Follow-up Telephone Call  Date of discharge and from where:  [facility] 7/21  Attempts:  1st Attempt  Reason for unsuccessful TCM follow-up call:  No answer/busy   Lenard Forth [facility] Guide, [facility] [phone] [address] [place], [place], [place] [address] Phone: [phone] Email: [email or web address]

## 2022-10-05 NOTE — Progress Notes (Signed)
   Subjective:    Patient ID: Lorraine [name], female    DOB: February 21, 1951, 72 y.o.   MRN: [id number]  HPI Weakness- pt saw Dr Neva Seat last week for 'extreme weakness'.  Sxs were not improving and Dr Neva Seat recommended ER evaluation.  Initially WBC was mildly elevated and upon recheck in the ER it was trending down.  Pt reports yesterday was a better day but by the afternoon she was exhausted.  No N/V.  No burning w/ urination or increased frequency.  No sore throat.  Does note a dull, mild frontal headache for the last few days.  Sxs started abruptly on 7/12 after being 'fine' the day before.   Review of Systems For ROS see HPI     Objective:   Physical Exam Vitals reviewed.  Constitutional:      General: She is not in acute distress.    Appearance: Normal appearance. She is well-developed. She is not ill-appearing.  HENT:     Head: Normocephalic and atraumatic.     Nose: No congestion or rhinorrhea.     Mouth/Throat:     Mouth: Mucous membranes are moist.     Pharynx: No oropharyngeal exudate or posterior oropharyngeal erythema.  Eyes:     Conjunctiva/sclera: Conjunctivae normal.     Pupils: Pupils are equal, round, and reactive to light.  Neck:     Thyroid: No thyromegaly.  Cardiovascular:     Rate and Rhythm: Normal rate and regular rhythm.     Pulses: Normal pulses.     Heart sounds: Murmur (II/VI SEM) heard.  Pulmonary:     Effort: Pulmonary effort is normal. No respiratory distress.     Breath sounds: Normal breath sounds.  Abdominal:     General: There is no distension.     Palpations: Abdomen is soft.     Tenderness: There is no abdominal tenderness.  Musculoskeletal:     Cervical back: Normal range of motion and neck supple.     Right lower leg: No edema.     Left lower leg: No edema.  Lymphadenopathy:     Cervical: No cervical adenopathy.  Skin:    General: Skin is warm and dry.  Neurological:     General: No focal deficit present.     Mental Status: She is  alert and oriented to person, place, and time.  Psychiatric:        Mood and Affect: Mood normal.        Behavior: Behavior normal.        Thought Content: Thought content normal.           Assessment & Plan:  Fatigue- new to provider.  Sxs started abruptly 7/12.  Reviewed Dr Paralee Cancel note and labs.  Reviewed ER note and labs.  Given the sudden onset of sxs, this seems more viral than anything.  Pt reports she did feel better yesterday but thinks she overdid it.  Encouraged her to rest and ease back into her activity level.  She is off work this week and we'll decide on next week based on how she feels on Friday.  Will check EBV panel given fatigue and elevated WBC.  Pt expressed understanding and is in agreement w/ plan.   Elevated WBC- new.  Initially 13.  Then 11.  Will repeat today to ensure this is back in normal range and not climbing.  Pt expressed understanding and is in agreement w/ plan.

## 2022-10-06 ENCOUNTER — Telehealth: Payer: Self-pay

## 2022-10-06 LAB — EPSTEIN-BARR VIRUS VCA ANTIBODY PANEL
EBV NA IgG: 42 U/mL — ABNORMAL HIGH
EBV VCA IgG: >750 U/mL — ABNORMAL HIGH
EBV VCA IgM: <36 U/mL

## 2022-10-06 NOTE — Telephone Encounter (Signed)
-----   Message from Lorraine [name] sent at 10/06/2022  7:34 AM EDT ----- Cliffton Asters blood cell counts are now normal.  Waiting on mono panel

## 2022-10-07 ENCOUNTER — Telehealth: Payer: Self-pay

## 2022-10-07 NOTE — Telephone Encounter (Signed)
Called patient with lab results she wanted me to let you know she is on the mend, not 100% but she feels much better than when she last saw you.

## 2022-10-07 NOTE — Telephone Encounter (Signed)
-----   Message from Neena Rhymes sent at 10/07/2022  7:29 AM EDT ----- Your EBV (mono) panel shows evidence of past infection, but not a current infection.  Hopefully you are feeling better!

## 2022-10-08 ENCOUNTER — Encounter: Payer: Medicare Other | Admitting: Physical Therapy

## 2022-10-08 ENCOUNTER — Encounter: Payer: Self-pay | Admitting: Family Medicine

## 2022-10-08 NOTE — Telephone Encounter (Signed)
Pt notes she is still weak but much improved from previous report. Notes she returns to work Monday and if you do not think this would be wise she will need a Dr note to excuse her further. Please advise if pt should proceed

## 2022-10-14 ENCOUNTER — Ambulatory Visit: Payer: Medicare Other | Attending: Physical Medicine and Rehabilitation | Admitting: Physical Therapy

## 2022-10-14 DIAGNOSIS — M25552 Pain in left hip: Secondary | ICD-10-CM | POA: Diagnosis not present

## 2022-10-14 DIAGNOSIS — M6281 Muscle weakness (generalized): Secondary | ICD-10-CM | POA: Insufficient documentation

## 2022-10-14 DIAGNOSIS — M25551 Pain in right hip: Secondary | ICD-10-CM | POA: Insufficient documentation

## 2022-10-14 DIAGNOSIS — M5459 Other low back pain: Secondary | ICD-10-CM | POA: Diagnosis not present

## 2022-10-14 NOTE — Therapy (Signed)
OUTPATIENT PHYSICAL THERAPY LOWER EXTREMITY PROGRESS NOTE   Patient Name: Lorraine [name] MRN: [id number] DOB:[birth year], 72 y.o., female Today's Date: 10/14/2022      PT End of Session - 10/14/22 0742     Visit Number 9    Date for PT Re-Evaluation 12/03/22    Authorization Type Medicare    PT Start Time 0750    PT Stop Time 0835    PT Time Calculation (min) 45 min    Activity Tolerance Patient tolerated treatment well               Past Medical History:  Diagnosis Date   Anxiety    Aortic atherosclerosis (HCC) 02/03/2018   Noted on CT Abd/Pelvis   AR (aortic regurgitation) 07/18/2017   Mild, noted on ECHO   Bilateral cataracts    Bilateral plantar fasciitis    Chronic knee pain    Chronic low back pain    Chronic lumbar radiculopathy 07/14/2017   Mild, L5, noted on electromyography   History of palpitations    2014-- low risk exercise tolerence test, no ischemi, PVC's (09-06-2012)   HSV-1 (herpes simplex virus 1) infection    Cold sore   Hx of colonic polyps    Hyperlipidemia    Insomnia    Lactose intolerance    Left nephrolithiasis 02/03/2018   Nonobstructing, noted on CT Abd/pelvis   Morton's metatarsalgia, neuralgia, or neuroma, bilateral    Osteoarthritis    knees and lumbar, feet   Osteopenia    Peripheral neuropathy    bilateral feet -- burning and stinging   PONV (postoperative nausea and vomiting)    severe   Presence of Watchman left atrial appendage closure device 06/11/2021   Watchman 27mm FLX with Dr. Lalla Brothers   PVC's (premature ventricular contractions)    Systolic murmur    "Slight"   Vaginal cyst    Wears glasses    Past Surgical History:  Procedure Laterality Date   ANTERIOR CERVICAL DECOMP/DISCECTOMY FUSION  01/2014   C4 -- C6   ATRIAL FIBRILLATION ABLATION N/A 04/09/2021   Procedure: ATRIAL FIBRILLATION ABLATION;  Surgeon: Lanier Prude, MD;  Location: [facility];  Service: Cardiovascular;  Laterality: N/A;    BREAST BIOPSY Left 07/17/2018   x2   CATARACT EXTRACTION W/ INTRAOCULAR LENS  IMPLANT, BILATERAL  2015   COLONOSCOPY  05/14/2015   CYSTO WITH HYDRODISTENSION N/A 04/14/2018   Procedure: CYSTOSCOPY/HYDRODISTENSION WITH INSTILLATION OF PYRIDIUM AND MARCAINE, BLADDER BIOPSY WITH FULGERATION 0.5 TO 2 CM;  Surgeon: Jerilee Field, MD;  Location: [facility];  Service: Urology;  Laterality: N/A;   ELBOW DEBRIDEMENT Right 09/10/2010   and tendon debridement and radial tunnel release   EXCISION MORTON'S NEUROMA Bilateral left 02-20-2009/  right & left 05-08-2009   EXCISION VAGINAL CYST N/A 07/01/2016   Procedure: EXCISION VAGINAL CYST;  Surgeon: Ranae Pila, MD;  Location: [facility];  Service: Gynecology;  Laterality: N/A;   LEFT ATRIAL APPENDAGE OCCLUSION N/A 06/11/2021   Procedure: LEFT ATRIAL APPENDAGE OCCLUSION;  Surgeon: Lanier Prude, MD;  Location: [facility];  Service: Cardiovascular;  Laterality: N/A;   PLANTAR FASCIA RELEASE Bilateral left 04-02-2010/  right 04-02-2016   TEE WITHOUT CARDIOVERSION N/A 06/11/2021   Procedure: TRANSESOPHAGEAL ECHOCARDIOGRAM (TEE);  Surgeon: Lanier Prude, MD;  Location: [facility];  Service: Cardiovascular;  Laterality: N/A;   TONSILLECTOMY AND ADENOIDECTOMY  child   TOTAL ABDOMINAL HYSTERECTOMY  1974   w/  Left salpingoophorectomy and Partial right salpingoophorectomy   Patient Active Problem List   Diagnosis Date Noted   Presence of Watchman left atrial appendage closure device 06/12/2021   Atrial fibrillation (HCC) 06/11/2021   Prolapse of female genital organs 07/09/2020   Candidiasis 12/19/2019   Chronic pain of both shoulders 11/28/2019   Malignant neoplasm of lower-inner quadrant of left breast in female, estrogen receptor positive (HCC) 07/26/2018   Anxiety about health 05/31/2018   Paroxysmal atrial fibrillation (HCC) 05/18/2018   Overweight (BMI 25.0-29.9) 05/18/2018    Systolic murmur 07/14/2017   Shingles 11/23/2016   Arthritis 05/26/2016   Insomnia 05/26/2016   Neuropathy 05/26/2016   Positive ANA (antinuclear antibody) 05/26/2016   Hyperlipidemia 05/26/2016   Neck and shoulder pain 01/26/2014   Cervicalgia 01/26/2014   METATARSALGIA 05/06/2010   PLANTAR FASCIITIS, LEFT 05/06/2010   FOOT PAIN, BILATERAL 05/06/2010    PCP: Neena Rhymes MD  REFERRING PROVIDER: Romero Belling MD  REFERRING DIAG: bil glute medius tendonopathy, history of glute tear and right ischial bursa pain  THERAPY DIAG:  Hip pain; weakness Rationale for Evaluation and Treatment: Rehabilitation  ONSET DATE: 03/15/22  SUBJECTIVE:   SUBJECTIVE STATEMENT: The last couple of weeks have been tough.  I got sick for 2 weeks with a viral infection.  I had extreme weakness.  I'm back to 98% now.   2 ED visits.  My implant procedure was cancelled.  I'm not sure I want to do it now.   Left side is worse.  The right side is better.  I haven't been able to exercise.    New MRI:   "progressive lumbar disc and facet degeneration.  Severe spinal stenosis and mild to moderate right neural foraminal stenosis at L4-5, mild to moderate left lateral recess and left neural foraminal stenosis L1-2"  PERTINENT HISTORY: Several injections in bursa on left over the years, fewer injections on right.  Just finished a Round of prednisone for 6 days helped but the left hip pain has returned.  Tore right glute from yardwork and fell twice years ago and took months to heal.  1 month ago got on recumbent bike at work for 27 min and strained right glute again (ischial area); I don't feel as stable   Does chair yoga; does water exercise on her own at [employer] Walking on the treadmill Resolved A-fib; newly referred to pain clinic for spinal cord stimulator to address peripheral neuropathy in feet;  Chronic lumbar radiculopathy; peripheral neuropathy in feet; cervical fusion Bil knee OA Lots of PT over  the year "I didn't get anything out of it"  PAIN:  PAIN:  Are you having pain? Yes NPRS scale: 2/10 left > right hips and ITBs; LBP Pain location: right ischial tuberosity; left lateral hip trochanteric bursa Aggravating factors: bending down;  sudden movement; sidelying; sitting in the car Relieving factors: walking does not ; movement PRECAUTIONS: None  WEIGHT BEARING RESTRICTIONS: No  FALLS:  Has patient fallen in last 6 months? No   OCCUPATION: working 12 hour shifts  PLOF: Independent  PATIENT GOALS: I told the doctor I didn't need to come but I wanted to do the exercises properly and try the DN (mostly here for the DN)   OBJECTIVE:     PATIENT SURVEYS:  FOTO 65% 6/28  69% goal met taken out of FOTO  COGNITION: Overall cognitive status: Within functional limits for tasks assessed      MUSCLE LENGTH: Hamstrings: Right 70 deg; Left 75 deg Decreased  hip flexor length: extension to 5 degrees bil  POSTURE: pelvic drop with attempted SLS <3 sec right/left  PALPATION: Tender points left lateral glutes; tender points right proximal HS at ischial tuberosity; tender bil ITB  LOWER EXTREMITY ROM: Bil knee flexion 125 degrees  LUMBAR ROM;    LOWER EXTREMITY MMT:  MMT Right eval Left eval 6/28  Hip flexion 4 4 4   Hip extension 4- 4- 4  Hip abduction 3- 3- 4-  Hip adduction     Hip internal rotation     Hip external rotation 4- 4- 4  Knee flexion 4- 4 4  Knee extension 4 4 4   Ankle dorsiflexion     Ankle plantarflexion     Ankle inversion     Ankle eversion      (Blank rows = not tested)  6/28; TRUNK STRENGTH:  Decreased activation of transverse abdominus muscles; abdominals 4-/5; decreased activation of lumbar multifidi; trunk extensors 4-/5  FUNCTIONAL TESTS:  5x sit to stand no hands 14.02 sec 6/11:  9.51 sec  GAIT:  Comments: pelvic drop, lateral trunk lean   TODAY'S TREATMENT:  DATE: 8/1 Discussion of current status and recent  illness Manual therapy: soft tissue mobilization to bil gluteals, bil ITB Trigger Point Dry-Needling  Treatment instructions: Expect mild to moderate muscle soreness. S/S of pneumothorax if dry needled over a lung field, and to seek immediate medical attention should they occur. Patient verbalized understanding of these instructions and education.  Patient Consent Given: Yes Education handout provided: Yes Muscles treated:bil lumbar multifidi, bil gluteals and bil ITB prone Electrical stimulation performed:yes bil lumbar multifid and bil gluteals Parameters:10 min 1.5 ma Treatment response/outcome: improved soft tissue mobility   DATE: 7/17 Discussed factors that could restrain HS: on bike (seat close, limit time initially); avoid straight leg bending over; overstretching HS  Discussed optimal ex's for bone density and muscle strengthening targeting quads: sit to stands, step ups Manual therapy: soft tissue mobilization to bil gluteals, bil ITB Trigger Point Dry-Needling  Treatment instructions: Expect mild to moderate muscle soreness. S/S of pneumothorax if dry needled over a lung field, and to seek immediate medical attention should they occur. Patient verbalized understanding of these instructions and education.  Patient Consent Given: Yes Education handout provided: Yes Muscles treated:bil lumbar multifidi, bil gluteals and bil ITB prone Electrical stimulation performed:yes bil lumbar multifid and bil gluteals Parameters:8 min 1.5 ma Treatment response/outcome: improved soft tissue mobility     DATE: 6/28 Lumbar ROM Review of progress toward goals and goal setting Discussion on clarifying activity restrictions post stimulator implant (want to maintain as much as possible to avoid atrophy) Manual therapy: soft tissue mobilization to bil gluteals, bil ITB Trigger Point Dry-Needling  Treatment instructions: Expect mild to moderate muscle soreness. S/S of pneumothorax if dry needled  over a lung field, and to seek immediate medical attention should they occur. Patient verbalized understanding of these instructions and education.  Patient Consent Given: Yes Education handout provided: Yes Muscles treated:bil lumbar multifidi, bil gluteals and bil ITB prone Electrical stimulation performed:yes bil lumbar multifid and bil gluteals Parameters:8 min 1.5 ma Treatment response/outcome: improved soft tissue mobility  DATE: 6/19 Patient education on proximal HS tendinopathy:  avoid straight leg bending over with gardening (bend knees), sit with feet resting on floor instead of extended out, avoid extreme hip flexed positions 5# prone HS curls 10x Manual therapy: soft tissue mobilization to bil gluteals, bil ITB Trigger Point Dry-Needling  Treatment instructions: Expect mild to moderate muscle  soreness. S/S of pneumothorax if dry needled over a lung field, and to seek immediate medical attention should they occur. Patient verbalized understanding of these instructions and education.  Patient Consent Given: Yes Education handout provided: Yes Muscles treated:sidelying bil gluteals and bil ITB in sidelying Electrical stimulation performed: no Parameters: N/A Treatment response/outcome: improved soft tissue mobility  PATIENT EDUCATION:  Education details: DN info per patient instructions Person educated: Patient Education method: Explanation Education comprehension: verbalized understanding  HOME EXERCISE PROGRAM:  Access Code: [email or web address] URL: [email or web address] Date: 08/05/2022 Prepared by: Lavinia Sharps  Exercises - Clamshell  - 1 x daily - 7 x weekly - 1 sets - 10 reps - Seated Hip Abduction with Resistance  - 1 x daily - 7 x weekly - 1 sets - 10 reps - Hip Flexor Stretch on Step  - 1 x daily - 7 x weekly - 1 sets - 10 reps - Standing Hip Extension with Leg Bent and Support  - 1 x daily - 7 x weekly - 1 sets - 10 reps  ASSESSMENT:  CLINICAL  IMPRESSION: The patient returns following a 2 week illness. She has been slowly increasing her activity level.  The patient benefits significantly from dry needling combined with electrical stimulation for management of neuromusculoskeletal pain and address movement impairments.  Much improved soft tissue mobility of lumbar and hip musculature and decreased tender point size and number in lateral thigh soft tissue.  Therapist monitoring response to all interventions and modifying treatment accordingly.         OBJECTIVE IMPAIRMENTS: decreased activity tolerance, decreased strength, increased fascial restrictions, impaired perceived functional ability, increased muscle spasms, and pain.   ACTIVITY LIMITATIONS: lifting, bending, sitting, squatting, sleeping, stairs, dressing, hygiene/grooming, and locomotion level  PARTICIPATION LIMITATIONS: meal prep, cleaning, laundry, driving, shopping, community activity, and occupation  PERSONAL FACTORS: Past/current experiences, Time since onset of injury/illness/exacerbation, and 3+ comorbidities: multi joint OA, peripheral neuropathy in both feet, chronic back pain  are also affecting patient's functional outcome.   REHAB POTENTIAL: Good  CLINICAL DECISION MAKING: moderate EVALUATION COMPLEXITY: moderate   GOALS: Goals reviewed with patient? Yes  SHORT TERM GOALS: Target date: 08/19/2022   The patient will demonstrate knowledge of basic self care strategies and exercises to promote healing   Baseline: Goal status: met 5/29  2.  The patient will report a 30% improvement in pain levels with functional activities which are currently difficult including bending, sidelying, sitting in the car Baseline:  Goal status: partially met 20%  3.  The patient will have improved hip strength to at least 4-/5 needed for standing, walking longer distances and descending stairs at home and in the community  Baseline:  Goal status: met 6/11  4.  Improved LE  strength with 5x sit to stand time improved to <13 sec Baseline:  Goal status: met 6/11  LONG TERM GOALS: Target date: 12/03/2022    The patient will be independent in a safe self progression of a home exercise program to promote further recovery of function   Baseline:  Goal status: ongoing  2.  The patient will report a 60% improvement in pain levels with functional activities which are currently difficult including bending, sidelying,sitting in the car Baseline:   6/28: 40-50% better  Goal status: ongoing 3.  The patient will have improved hip strength to at least 4/5 needed for standing, walking longer distances and descending stairs at home and in the community  Baseline:  Goal status: ongoing    4.  The patient will have improved FOTO score to   68%     indicating improved function with less pain  Baseline:  Goal status: met 6/28  5. Able to walk >2 miles with pain level <5/10 new  6.  The patient will have improved trunk flexor and extensor muscle strength to at least 4+/5 needed for gardening, picking up limbs, using riding mower, stooping/bending new        PLAN:  PT FREQUENCY: 1x/week  PT DURATION: 12 weeks  PLANNED INTERVENTIONS: Therapeutic exercises, Therapeutic activity, Neuromuscular re-education, Balance training, Gait training, Patient/Family education, Self Care, Joint mobilization, Aquatic Therapy, Dry Needling, Cryotherapy, Moist heat, Taping, Ultrasound, Ionotophoresis 4mg /ml Dexamethasone, Manual therapy, and Re-evaluation  PLAN FOR NEXT SESSION: DN;  low level gluteal and core ex's; patient education on stenosis;   prone HS curls with 5# to HEP; decompression series; squatting/sit to stand; step ups for attic ladder; check to see if recert signed rerouted  Lavinia Sharps, PT 10/14/22 7:53 PM Phone: [phone] Fax: [phone]

## 2022-10-16 ENCOUNTER — Encounter: Payer: Self-pay | Admitting: Family Medicine

## 2022-10-18 ENCOUNTER — Other Ambulatory Visit: Payer: Self-pay

## 2022-10-18 MED ORDER — ZOLPIDEM TARTRATE 10 MG PO TABS: 10.0000 mg | ORAL_TABLET | Freq: Every day | ORAL | 3 refills | Status: DC

## 2022-10-18 NOTE — Telephone Encounter (Signed)
Ambien 10 mg LOV: 10/05/22 Last Refill:45/24 Upcoming appt: 12/08/22

## 2022-10-22 ENCOUNTER — Ambulatory Visit: Payer: Medicare Other | Admitting: Physical Therapy

## 2022-10-22 DIAGNOSIS — M6281 Muscle weakness (generalized): Secondary | ICD-10-CM | POA: Diagnosis not present

## 2022-10-22 DIAGNOSIS — M5459 Other low back pain: Secondary | ICD-10-CM | POA: Diagnosis not present

## 2022-10-22 DIAGNOSIS — M25552 Pain in left hip: Secondary | ICD-10-CM

## 2022-10-22 DIAGNOSIS — M25551 Pain in right hip: Secondary | ICD-10-CM

## 2022-10-22 NOTE — Therapy (Signed)
OUTPATIENT PHYSICAL THERAPY LOWER EXTREMITY PROGRESS NOTE   Patient Name: Lorraine [name] MRN: [id number] DOB:08-06-1950, 72 y.o., female Today's Date: 10/22/2022  Progress Note Reporting Period 07/22/22 to 10/22/22  See note below for Objective Data and Assessment of Progress/Goals.        PT End of Session - 10/22/22 0751     Visit Number 10    Date for PT Re-Evaluation 11/20/22    Authorization Type Medicare    PT Start Time 0753    PT Stop Time 0835    PT Time Calculation (min) 42 min    Activity Tolerance Patient tolerated treatment well               Past Medical History:  Diagnosis Date   Anxiety    Aortic atherosclerosis (HCC) 02/03/2018   Noted on CT Abd/Pelvis   AR (aortic regurgitation) 07/18/2017   Mild, noted on ECHO   Bilateral cataracts    Bilateral plantar fasciitis    Chronic knee pain    Chronic low back pain    Chronic lumbar radiculopathy 07/14/2017   Mild, L5, noted on electromyography   History of palpitations    2014-- low risk exercise tolerence test, no ischemi, PVC's (09-06-2012)   HSV-1 (herpes simplex virus 1) infection    Cold sore   Hx of colonic polyps    Hyperlipidemia    Insomnia    Lactose intolerance    Left nephrolithiasis 02/03/2018   Nonobstructing, noted on CT Abd/pelvis   Morton's metatarsalgia, neuralgia, or neuroma, bilateral    Osteoarthritis    knees and lumbar, feet   Osteopenia    Peripheral neuropathy    bilateral feet -- burning and stinging   PONV (postoperative nausea and vomiting)    severe   Presence of Watchman left atrial appendage closure device 06/11/2021   Watchman 27mm FLX with Dr. Lalla Brothers   PVC's (premature ventricular contractions)    Systolic murmur    "Slight"   Vaginal cyst    Wears glasses    Past Surgical History:  Procedure Laterality Date   ANTERIOR CERVICAL DECOMP/DISCECTOMY FUSION  01/2014   C4 -- C6   ATRIAL FIBRILLATION ABLATION N/A 04/09/2021   Procedure: ATRIAL FIBRILLATION  ABLATION;  Surgeon: Lanier Prude, MD;  Location: [facility];  Service: Cardiovascular;  Laterality: N/A;   BREAST BIOPSY Left 07/17/2018   x2   CATARACT EXTRACTION W/ INTRAOCULAR LENS  IMPLANT, BILATERAL  2015   COLONOSCOPY  05/14/2015   CYSTO WITH HYDRODISTENSION N/A 04/14/2018   Procedure: CYSTOSCOPY/HYDRODISTENSION WITH INSTILLATION OF PYRIDIUM AND MARCAINE, BLADDER BIOPSY WITH FULGERATION 0.5 TO 2 CM;  Surgeon: Jerilee Field, MD;  Location: [facility];  Service: Urology;  Laterality: N/A;   ELBOW DEBRIDEMENT Right 09/10/2010   and tendon debridement and radial tunnel release   EXCISION MORTON'S NEUROMA Bilateral left 02-20-2009/  right & left 05-08-2009   EXCISION VAGINAL CYST N/A 07/01/2016   Procedure: EXCISION VAGINAL CYST;  Surgeon: Ranae Pila, MD;  Location: [facility];  Service: Gynecology;  Laterality: N/A;   LEFT ATRIAL APPENDAGE OCCLUSION N/A 06/11/2021   Procedure: LEFT ATRIAL APPENDAGE OCCLUSION;  Surgeon: Lanier Prude, MD;  Location: [facility];  Service: Cardiovascular;  Laterality: N/A;   PLANTAR FASCIA RELEASE Bilateral left 04-02-2010/  right 04-02-2016   TEE WITHOUT CARDIOVERSION N/A 06/11/2021   Procedure: TRANSESOPHAGEAL ECHOCARDIOGRAM (TEE);  Surgeon: Lanier Prude, MD;  Location[facility];  Service:  Cardiovascular;  Laterality: N/A;   TONSILLECTOMY AND ADENOIDECTOMY  child   TOTAL ABDOMINAL HYSTERECTOMY  1974   w/  Left salpingoophorectomy and Partial right salpingoophorectomy   Patient Active Problem List   Diagnosis Date Noted   Presence of Watchman left atrial appendage closure device 06/12/2021   Atrial fibrillation (HCC) 06/11/2021   Prolapse of female genital organs 07/09/2020   Candidiasis 12/19/2019   Chronic pain of both shoulders 11/28/2019   Malignant neoplasm of lower-inner quadrant of left breast in female, estrogen receptor positive (HCC) 07/26/2018   Anxiety  about health 05/31/2018   Paroxysmal atrial fibrillation (HCC) 05/18/2018   Overweight (BMI 25.0-29.9) 05/18/2018   Systolic murmur 07/14/2017   Shingles 11/23/2016   Arthritis 05/26/2016   Insomnia 05/26/2016   Neuropathy 05/26/2016   Positive ANA (antinuclear antibody) 05/26/2016   Hyperlipidemia 05/26/2016   Neck and shoulder pain 01/26/2014   Cervicalgia 01/26/2014   METATARSALGIA 05/06/2010   PLANTAR FASCIITIS, LEFT 05/06/2010   FOOT PAIN, BILATERAL 05/06/2010    PCP: Neena Rhymes MD  REFERRING PROVIDER: Romero Belling MD  REFERRING DIAG: bil glute medius tendonopathy, history of glute tear and right ischial bursa pain  THERAPY DIAG:  Hip pain; weakness Rationale for Evaluation and Treatment: Rehabilitation  ONSET DATE: 03/15/22  SUBJECTIVE:   SUBJECTIVE STATEMENT: My hips and back are improving.  My left side gives me the fits.  Right proximal HS gave me the tiniest twinge.      New MRI:   "progressive lumbar disc and facet degeneration.  Severe spinal stenosis and mild to moderate right neural foraminal stenosis at L4-5, mild to moderate left lateral recess and left neural foraminal stenosis L1-2"  PERTINENT HISTORY: Several injections in bursa on left over the years, fewer injections on right.  Just finished a Round of prednisone for 6 days helped but the left hip pain has returned.  Tore right glute from yardwork and fell twice years ago and took months to heal.  1 month ago got on recumbent bike at work for 27 min and strained right glute again (ischial area); I don't feel as stable   Does chair yoga; does water exercise on her own at [employer] Walking on the treadmill Resolved A-fib; newly referred to pain clinic for spinal cord stimulator to address peripheral neuropathy in feet;  Chronic lumbar radiculopathy; peripheral neuropathy in feet; cervical fusion Bil knee OA Lots of PT over the year "I didn't get anything out of it"  PAIN:  PAIN:  Are you  having pain? Yes NPRS scale: 2/10 left > right hips and ITBs; LBP Pain location: right ischial tuberosity; left lateral hip trochanteric bursa Aggravating factors: bending down;  sudden movement; sidelying; sitting in the car Relieving factors: walking does not ; movement PRECAUTIONS: None  WEIGHT BEARING RESTRICTIONS: No  FALLS:  Has patient fallen in last 6 months? No   OCCUPATION: working 12 hour shifts  PLOF: Independent  PATIENT GOALS: I told the doctor I didn't need to come but I wanted to do the exercises properly and try the DN (mostly here for the DN)   OBJECTIVE:     PATIENT SURVEYS:  FOTO 65% 6/28  69% goal met taken out of FOTO  COGNITION: Overall cognitive status: Within functional limits for tasks assessed      MUSCLE LENGTH: Hamstrings: Right 70 deg; Left 75 deg Decreased hip flexor length: extension to 5 degrees bil  POSTURE: pelvic drop with attempted SLS <3 sec right/left 8/9:  immediate left pelvic  drop but able to SLS 8 sec; minimal drop on right SLS 8 sec  PALPATION: Tender points left lateral glutes; tender points right proximal HS at ischial tuberosity; tender bil ITB  LOWER EXTREMITY ROM: Bil knee flexion 125 degrees  LUMBAR ROM;    LOWER EXTREMITY MMT:  MMT Right eval Left eval 6/28 8/9  Hip flexion 4 4 4  4+  Hip extension 4- 4- 4 4  Hip abduction 3- 3- 4- 4  Hip adduction      Hip internal rotation      Hip external rotation 4- 4- 4 4  Knee flexion 4- 4 4 4+  Knee extension 4 4 4  4+  Ankle dorsiflexion      Ankle plantarflexion      Ankle inversion      Ankle eversion       (Blank rows = not tested)  6/28; TRUNK STRENGTH:  Decreased activation of transverse abdominus muscles; abdominals 4-/5; decreased activation of lumbar multifidi; trunk extensors 4-/5  8/9:  abdominal and trunk extensors grossly 4/5  FUNCTIONAL TESTS:  5x sit to stand no hands 14.02 sec 6/11:  9.51 sec 8/9: 9.45  GAIT:  Comments: pelvic drop,  lateral trunk lean   TODAY'S TREATMENT:  DATE: 8/9 Discussion of current status and progress SLS: discussed practice for home and strategies to avoid pelvic drop  Strength assessment Manual therapy: soft tissue mobilization to bil gluteals, bil ITB Trigger Point Dry-Needling  Treatment instructions: Expect mild to moderate muscle soreness. S/S of pneumothorax if dry needled over a lung field, and to seek immediate medical attention should they occur. Patient verbalized understanding of these instructions and education.  Patient Consent Given: Yes Education handout provided: Yes Muscles treated:bil lumbar multifidi, bil gluteals and bil ITB prone Electrical stimulation performed:yes bil lumbar multifid and bil gluteals Parameters:10 min 1.5 ma Treatment response/outcome: improved soft tissue mobility   DATE: 8/1 Discussion of current status and recent illness Manual therapy: soft tissue mobilization to bil gluteals, bil ITB Trigger Point Dry-Needling  Treatment instructions: Expect mild to moderate muscle soreness. S/S of pneumothorax if dry needled over a lung field, and to seek immediate medical attention should they occur. Patient verbalized understanding of these instructions and education.  Patient Consent Given: Yes Education handout provided: Yes Muscles treated:bil lumbar multifidi, bil gluteals and bil ITB prone Electrical stimulation performed:yes bil lumbar multifid and bil gluteals Parameters:10 min 1.5 ma Treatment response/outcome: improved soft tissue mobility   DATE: 7/17 Discussed factors that could restrain HS: on bike (seat close, limit time initially); avoid straight leg bending over; overstretching HS  Discussed optimal ex's for bone density and muscle strengthening targeting quads: sit to stands, step ups Manual therapy: soft tissue mobilization to bil gluteals, bil ITB Trigger Point Dry-Needling  Treatment instructions: Expect mild to moderate muscle soreness.  S/S of pneumothorax if dry needled over a lung field, and to seek immediate medical attention should they occur. Patient verbalized understanding of these instructions and education.  Patient Consent Given: Yes Education handout provided: Yes Muscles treated:bil lumbar multifidi, bil gluteals and bil ITB prone Electrical stimulation performed:yes bil lumbar multifid and bil gluteals Parameters:8 min 1.5 ma Treatment response/outcome: improved soft tissue mobility     DATE: 6/28 Lumbar ROM Review of progress toward goals and goal setting Discussion on clarifying activity restrictions post stimulator implant (want to maintain as much as possible to avoid atrophy) Manual therapy: soft tissue mobilization to bil gluteals, bil ITB Trigger Point Dry-Needling  Treatment instructions:  Expect mild to moderate muscle soreness. S/S of pneumothorax if dry needled over a lung field, and to seek immediate medical attention should they occur. Patient verbalized understanding of these instructions and education.  Patient Consent Given: Yes Education handout provided: Yes Muscles treated:bil lumbar multifidi, bil gluteals and bil ITB prone Electrical stimulation performed:yes bil lumbar multifid and bil gluteals Parameters:8 min 1.5 ma Treatment response/outcome: improved soft tissue mobility  DATE: 6/19 Patient education on proximal HS tendinopathy:  avoid straight leg bending over with gardening (bend knees), sit with feet resting on floor instead of extended out, avoid extreme hip flexed positions 5# prone HS curls 10x Manual therapy: soft tissue mobilization to bil gluteals, bil ITB Trigger Point Dry-Needling  Treatment instructions: Expect mild to moderate muscle soreness. S/S of pneumothorax if dry needled over a lung field, and to seek immediate medical attention should they occur. Patient verbalized understanding of these instructions and education.  Patient Consent Given: Yes Education  handout provided: Yes Muscles treated:sidelying bil gluteals and bil ITB in sidelying Electrical stimulation performed: no Parameters: N/A Treatment response/outcome: improved soft tissue mobility  PATIENT EDUCATION:  Education details: DN info per patient instructions Person educated: Patient Education method: Explanation Education comprehension: verbalized understanding  HOME EXERCISE PROGRAM:  Access Code: [email or web address] URL: [email or web address] Date: 08/05/2022 Prepared by: Lavinia Sharps  Exercises - Clamshell  - 1 x daily - 7 x weekly - 1 sets - 10 reps - Seated Hip Abduction with Resistance  - 1 x daily - 7 x weekly - 1 sets - 10 reps - Hip Flexor Stretch on Step  - 1 x daily - 7 x weekly - 1 sets - 10 reps - Standing Hip Extension with Leg Bent and Support  - 1 x daily - 7 x weekly - 1 sets - 10 reps  ASSESSMENT:  CLINICAL IMPRESSION: Despite recent illness, the patient continues to progress with functional mobility and is able to walk 1 1/2 to 2 miles with low levels of pain reported.  Her LE strength is improving as well particular quad muscle strength, however gluteus medius weakness is apparent with a pelvic drop in single limb stance.  She responds well to DN combined with ES to address myofascial tender points and is hopeful this will continue to manage her symptoms so she can avoid future steroid injections.  Will continue to encourage exercise to build muscle strength and resiliency to decrease risk of future exacerbations and re-injury.     OBJECTIVE IMPAIRMENTS: decreased activity tolerance, decreased strength, increased fascial restrictions, impaired perceived functional ability, increased muscle spasms, and pain.   ACTIVITY LIMITATIONS: lifting, bending, sitting, squatting, sleeping, stairs, dressing, hygiene/grooming, and locomotion level  PARTICIPATION LIMITATIONS: meal prep, cleaning, laundry, driving, shopping, community activity, and  occupation  PERSONAL FACTORS: Past/current experiences, Time since onset of injury/illness/exacerbation, and 3+ comorbidities: multi joint OA, peripheral neuropathy in both feet, chronic back pain  are also affecting patient's functional outcome.   REHAB POTENTIAL: Good  CLINICAL DECISION MAKING: moderate EVALUATION COMPLEXITY: moderate   GOALS: Goals reviewed with patient? Yes  SHORT TERM GOALS: Target date: 08/19/2022   The patient will demonstrate knowledge of basic self care strategies and exercises to promote healing   Baseline: Goal status: met 5/29  2.  The patient will report a 30% improvement in pain levels with functional activities which are currently difficult including bending, sidelying, sitting in the car Baseline:  Goal status: partially met 20%  3.  The patient will have improved hip strength  to at least 4-/5 needed for standing, walking longer distances and descending stairs at home and in the community  Baseline:  Goal status: met 6/11  4.  Improved LE strength with 5x sit to stand time improved to <13 sec Baseline:  Goal status: met 6/11  LONG TERM GOALS: Target date: 12/03/2022    The patient will be independent in a safe self progression of a home exercise program to promote further recovery of function   Baseline:  Goal status: ongoing  2.  The patient will report a 60% improvement in pain levels with functional activities which are currently difficult including bending, sidelying,sitting in the car Baseline:   6/28: 40-50% better Met 8/9:    car 100 %: better sidelying 80% better;   Goal status: ongoing 3.  The patient will have improved hip strength to at least 4/5 needed for standing, walking longer distances and descending stairs at home and in the community  Baseline:  Goal status: ongoing    4.  The patient will have improved FOTO score to   68%     indicating improved function with less pain  Baseline:  Goal status: met 6/28  5. Able  to walk >2 miles with pain level <5/10 Met 8/9  1/10  6.  The patient will have improved trunk flexor and extensor muscle strength to at least 4+/5 needed for gardening, picking up limbs, using riding mower, stooping/bending new        PLAN:  PT FREQUENCY: 1x/week  PT DURATION: 12 weeks  PLANNED INTERVENTIONS: Therapeutic exercises, Therapeutic activity, Neuromuscular re-education, Balance training, Gait training, Patient/Family education, Self Care, Joint mobilization, Aquatic Therapy, Dry Needling, Cryotherapy, Moist heat, Taping, Ultrasound, Ionotophoresis 4mg /ml Dexamethasone, Manual therapy, and Re-evaluation  PLAN FOR NEXT SESSION: DN/ES; gluteal and core ex's; patient education on stenosis;   prone HS curls with 5# to HEP; decompression series; squatting/sit to stand; step ups for attic ladder  Lavinia Sharps, PT 10/22/22 9:51 AM Phone: [phone] Fax: [phone]

## 2022-10-27 ENCOUNTER — Ambulatory Visit: Payer: Medicare Other | Admitting: Physical Therapy

## 2022-10-27 DIAGNOSIS — M5459 Other low back pain: Secondary | ICD-10-CM | POA: Diagnosis not present

## 2022-10-27 DIAGNOSIS — M6281 Muscle weakness (generalized): Secondary | ICD-10-CM

## 2022-10-27 DIAGNOSIS — M25551 Pain in right hip: Secondary | ICD-10-CM | POA: Diagnosis not present

## 2022-10-27 DIAGNOSIS — M25552 Pain in left hip: Secondary | ICD-10-CM | POA: Diagnosis not present

## 2022-10-27 NOTE — Therapy (Signed)
OUTPATIENT PHYSICAL THERAPY LOWER EXTREMITY PROGRESS NOTE   Patient Name: Lorraine [name] MRN: [id number] DOB:1950/08/07, 72 y.o., female Today's Date: 10/27/2022        PT End of Session - 10/27/22 0749     Visit Number 11    Date for PT Re-Evaluation 11/20/22    Authorization Type Medicare    PT Start Time 0751    PT Stop Time 0840    PT Time Calculation (min) 49 min    Activity Tolerance Patient tolerated treatment well               Past Medical History:  Diagnosis Date   Anxiety    Aortic atherosclerosis (HCC) 02/03/2018   Noted on CT Abd/Pelvis   AR (aortic regurgitation) 07/18/2017   Mild, noted on ECHO   Bilateral cataracts    Bilateral plantar fasciitis    Chronic knee pain    Chronic low back pain    Chronic lumbar radiculopathy 07/14/2017   Mild, L5, noted on electromyography   History of palpitations    2014-- low risk exercise tolerence test, no ischemi, PVC's (09-06-2012)   HSV-1 (herpes simplex virus 1) infection    Cold sore   Hx of colonic polyps    Hyperlipidemia    Insomnia    Lactose intolerance    Left nephrolithiasis 02/03/2018   Nonobstructing, noted on CT Abd/pelvis   Morton's metatarsalgia, neuralgia, or neuroma, bilateral    Osteoarthritis    knees and lumbar, feet   Osteopenia    Peripheral neuropathy    bilateral feet -- burning and stinging   PONV (postoperative nausea and vomiting)    severe   Presence of Watchman left atrial appendage closure device 06/11/2021   Watchman 27mm FLX with Dr. Lalla Brothers   PVC's (premature ventricular contractions)    Systolic murmur    "Slight"   Vaginal cyst    Wears glasses    Past Surgical History:  Procedure Laterality Date   ANTERIOR CERVICAL DECOMP/DISCECTOMY FUSION  01/2014   C4 -- C6   ATRIAL FIBRILLATION ABLATION N/A 04/09/2021   Procedure: ATRIAL FIBRILLATION ABLATION;  Surgeon: Lanier Prude, MD;  Location: [facility];  Service: Cardiovascular;  Laterality: N/A;    BREAST BIOPSY Left 07/17/2018   x2   CATARACT EXTRACTION W/ INTRAOCULAR LENS  IMPLANT, BILATERAL  2015   COLONOSCOPY  05/14/2015   CYSTO WITH HYDRODISTENSION N/A 04/14/2018   Procedure: CYSTOSCOPY/HYDRODISTENSION WITH INSTILLATION OF PYRIDIUM AND MARCAINE, BLADDER BIOPSY WITH FULGERATION 0.5 TO 2 CM;  Surgeon: Jerilee Field, MD;  Location: [facility];  Service: Urology;  Laterality: N/A;   ELBOW DEBRIDEMENT Right 09/10/2010   and tendon debridement and radial tunnel release   EXCISION MORTON'S NEUROMA Bilateral left 02-20-2009/  right & left 05-08-2009   EXCISION VAGINAL CYST N/A 07/01/2016   Procedure: EXCISION VAGINAL CYST;  Surgeon: Ranae Pila, MD;  Location: [facility];  Service: Gynecology;  Laterality: N/A;   LEFT ATRIAL APPENDAGE OCCLUSION N/A 06/11/2021   Procedure: LEFT ATRIAL APPENDAGE OCCLUSION;  Surgeon: Lanier Prude, MD;  Location: [facility];  Service: Cardiovascular;  Laterality: N/A;   PLANTAR FASCIA RELEASE Bilateral left 04-02-2010/  right 04-02-2016   TEE WITHOUT CARDIOVERSION N/A 06/11/2021   Procedure: TRANSESOPHAGEAL ECHOCARDIOGRAM (TEE);  Surgeon: Lanier Prude, MD;  Location: [facility];  Service: Cardiovascular;  Laterality: N/A;   TONSILLECTOMY AND ADENOIDECTOMY  child   TOTAL ABDOMINAL HYSTERECTOMY  1974  w/  Left salpingoophorectomy and Partial right salpingoophorectomy   Patient Active Problem List   Diagnosis Date Noted   Presence of Watchman left atrial appendage closure device 06/12/2021   Atrial fibrillation (HCC) 06/11/2021   Prolapse of female genital organs 07/09/2020   Candidiasis 12/19/2019   Chronic pain of both shoulders 11/28/2019   Malignant neoplasm of lower-inner quadrant of left breast in female, estrogen receptor positive (HCC) 07/26/2018   Anxiety about health 05/31/2018   Paroxysmal atrial fibrillation (HCC) 05/18/2018   Overweight (BMI 25.0-29.9) 05/18/2018    Systolic murmur 07/14/2017   Shingles 11/23/2016   Arthritis 05/26/2016   Insomnia 05/26/2016   Neuropathy 05/26/2016   Positive ANA (antinuclear antibody) 05/26/2016   Hyperlipidemia 05/26/2016   Neck and shoulder pain 01/26/2014   Cervicalgia 01/26/2014   METATARSALGIA 05/06/2010   PLANTAR FASCIITIS, LEFT 05/06/2010   FOOT PAIN, BILATERAL 05/06/2010    PCP: Neena Rhymes MD  REFERRING PROVIDER: Romero Belling MD  REFERRING DIAG: bil glute medius tendonopathy, history of glute tear and right ischial bursa pain  THERAPY DIAG:  Hip pain; weakness Rationale for Evaluation and Treatment: Rehabilitation  ONSET DATE: 03/15/22  SUBJECTIVE:   SUBJECTIVE STATEMENT: It's been a busy week.  Did yoga at lunch yesterday.  I was careful not to hurt the right side.  I didn't feel great last night.  I usually do some kind of exercise at lunch.  This morning I feel better.    New MRI:   "progressive lumbar disc and facet degeneration.  Severe spinal stenosis and mild to moderate right neural foraminal stenosis at L4-5, mild to moderate left lateral recess and left neural foraminal stenosis L1-2"  PERTINENT HISTORY: Several injections in bursa on left over the years, fewer injections on right.  Just finished a Round of prednisone for 6 days helped but the left hip pain has returned.  Tore right glute from yardwork and fell twice years ago and took months to heal.  1 month ago got on recumbent bike at work for 27 min and strained right glute again (ischial area); I don't feel as stable   Does chair yoga; does water exercise on her own at [employer] Walking on the treadmill Resolved A-fib; newly referred to pain clinic for spinal cord stimulator to address peripheral neuropathy in feet;  Chronic lumbar radiculopathy; peripheral neuropathy in feet; cervical fusion Bil knee OA Lots of PT over the year "I didn't get anything out of it"  PAIN:  PAIN:  Are you having pain? Yes NPRS scale: 2/10  left > right hips and ITBs; LBP Pain location: right ischial tuberosity; left lateral hip trochanteric bursa Aggravating factors: bending down;  sudden movement; sidelying; sitting in the car Relieving factors: walking does not ; movement PRECAUTIONS: None  WEIGHT BEARING RESTRICTIONS: No  FALLS:  Has patient fallen in last 6 months? No   OCCUPATION: working 12 hour shifts  PLOF: Independent  PATIENT GOALS: I told the doctor I didn't need to come but I wanted to do the exercises properly and try the DN (mostly here for the DN)   OBJECTIVE:     PATIENT SURVEYS:  FOTO 65% 6/28  69% goal met taken out of FOTO  COGNITION: Overall cognitive status: Within functional limits for tasks assessed      MUSCLE LENGTH: Hamstrings: Right 70 deg; Left 75 deg Decreased hip flexor length: extension to 5 degrees bil  POSTURE: pelvic drop with attempted SLS <3 sec right/left 8/9:  immediate left pelvic drop  but able to SLS 8 sec; minimal drop on right SLS 8 sec  PALPATION: Tender points left lateral glutes; tender points right proximal HS at ischial tuberosity; tender bil ITB  LOWER EXTREMITY ROM: Bil knee flexion 125 degrees  LUMBAR ROM;    LOWER EXTREMITY MMT:  MMT Right eval Left eval 6/28 8/9  Hip flexion 4 4 4  4+  Hip extension 4- 4- 4 4  Hip abduction 3- 3- 4- 4  Hip adduction      Hip internal rotation      Hip external rotation 4- 4- 4 4  Knee flexion 4- 4 4 4+  Knee extension 4 4 4  4+  Ankle dorsiflexion      Ankle plantarflexion      Ankle inversion      Ankle eversion       (Blank rows = not tested)  6/28; TRUNK STRENGTH:  Decreased activation of transverse abdominus muscles; abdominals 4-/5; decreased activation of lumbar multifidi; trunk extensors 4-/5  8/9:  abdominal and trunk extensors grossly 4/5  FUNCTIONAL TESTS:  5x sit to stand no hands 14.02 sec 6/11:  9.51 sec 8/9: 9.45  GAIT:  Comments: pelvic drop, lateral trunk lean   TODAY'S  TREATMENT:  DATE: 8/14 4 inch forward step ups 8x right/left 4 inch lateral step ups 8x right/left 2 inch glute hip hikes 5x right/left Manual therapy: soft tissue mobilization to bil gluteals, bil ITB Trigger Point Dry-Needling  Treatment instructions: Expect mild to moderate muscle soreness. S/S of pneumothorax if dry needled over a lung field, and to seek immediate medical attention should they occur. Patient verbalized understanding of these instructions and education. Patient Consent Given: Yes Education handout provided: Yes Muscles treated:bil lumbar multifidi, bil gluteals and bil ITB prone Electrical stimulation performed:yes bil lumbar multifid and bil gluteals Parameters:10 min 1.5 ma Treatment response/outcome: improved soft tissue mobility    DATE: 8/9 Discussion of current status and progress SLS: discussed practice for home and strategies to avoid pelvic drop  Strength assessment Manual therapy: soft tissue mobilization to bil gluteals, bil ITB Trigger Point Dry-Needling  Treatment instructions: Expect mild to moderate muscle soreness. S/S of pneumothorax if dry needled over a lung field, and to seek immediate medical attention should they occur. Patient verbalized understanding of these instructions and education.  Patient Consent Given: Yes Education handout provided: Yes Muscles treated:bil lumbar multifidi, bil gluteals and bil ITB prone Electrical stimulation performed:yes bil lumbar multifid and bil gluteals Parameters:10 min 1.5 ma Treatment response/outcome: improved soft tissue mobility   DATE: 8/1 Discussion of current status and recent illness Manual therapy: soft tissue mobilization to bil gluteals, bil ITB Trigger Point Dry-Needling  Treatment instructions: Expect mild to moderate muscle soreness. S/S of pneumothorax if dry needled over a lung field, and to seek immediate medical attention should they occur. Patient verbalized understanding of these  instructions and education.  Patient Consent Given: Yes Education handout provided: Yes Muscles treated:bil lumbar multifidi, bil gluteals and bil ITB prone Electrical stimulation performed:yes bil lumbar multifid and bil gluteals Parameters:10 min 1.5 ma Treatment response/outcome: improved soft tissue mobility   DATE: 7/17 Discussed factors that could restrain HS: on bike (seat close, limit time initially); avoid straight leg bending over; overstretching HS  Discussed optimal ex's for bone density and muscle strengthening targeting quads: sit to stands, step ups Manual therapy: soft tissue mobilization to bil gluteals, bil ITB Trigger Point Dry-Needling  Treatment instructions: Expect mild to moderate muscle soreness. S/S of pneumothorax if  dry needled over a lung field, and to seek immediate medical attention should they occur. Patient verbalized understanding of these instructions and education.  Patient Consent Given: Yes Education handout provided: Yes Muscles treated:bil lumbar multifidi, bil gluteals and bil ITB prone Electrical stimulation performed:yes bil lumbar multifid and bil gluteals Parameters:8 min 1.5 ma Treatment response/outcome: improved soft tissue mobility    PATIENT EDUCATION:  Education details: DN info per patient instructions Person educated: Patient Education method: Explanation Education comprehension: verbalized understanding  HOME EXERCISE PROGRAM:  Access Code: [email or web address] URL: [email or web address] Date: 08/05/2022 Prepared by: Lavinia Sharps  Exercises - Clamshell  - 1 x daily - 7 x weekly - 1 sets - 10 reps - Seated Hip Abduction with Resistance  - 1 x daily - 7 x weekly - 1 sets - 10 reps - Hip Flexor Stretch on Step  - 1 x daily - 7 x weekly - 1 sets - 10 reps - Standing Hip Extension with Leg Bent and Support  - 1 x daily - 7 x weekly - 1 sets - 10 reps  ASSESSMENT:  CLINICAL IMPRESSION: Discussed adding LE strengthening to  compliment stretching and dry needling.  The patient benefits significantly from dry needling combined with electrical stimulation to stimulate underlying myofascial trigger points and muscular tissue for management of neuromusculoskeletal pain and address movement impairments.  Much improved soft tissue mobility and decreased tender point size and number following treatment session.        OBJECTIVE IMPAIRMENTS: decreased activity tolerance, decreased strength, increased fascial restrictions, impaired perceived functional ability, increased muscle spasms, and pain.   ACTIVITY LIMITATIONS: lifting, bending, sitting, squatting, sleeping, stairs, dressing, hygiene/grooming, and locomotion level  PARTICIPATION LIMITATIONS: meal prep, cleaning, laundry, driving, shopping, community activity, and occupation  PERSONAL FACTORS: Past/current experiences, Time since onset of injury/illness/exacerbation, and 3+ comorbidities: multi joint OA, peripheral neuropathy in both feet, chronic back pain  are also affecting patient's functional outcome.   REHAB POTENTIAL: Good  CLINICAL DECISION MAKING: moderate EVALUATION COMPLEXITY: moderate   GOALS: Goals reviewed with patient? Yes  SHORT TERM GOALS: Target date: 08/19/2022   The patient will demonstrate knowledge of basic self care strategies and exercises to promote healing   Baseline: Goal status: met 5/29  2.  The patient will report a 30% improvement in pain levels with functional activities which are currently difficult including bending, sidelying, sitting in the car Baseline:  Goal status: partially met 20%  3.  The patient will have improved hip strength to at least 4-/5 needed for standing, walking longer distances and descending stairs at home and in the community  Baseline:  Goal status: met 6/11  4.  Improved LE strength with 5x sit to stand time improved to <13 sec Baseline:  Goal status: met 6/11  LONG TERM GOALS: Target date:  12/03/2022    The patient will be independent in a safe self progression of a home exercise program to promote further recovery of function   Baseline:  Goal status: ongoing  2.  The patient will report a 60% improvement in pain levels with functional activities which are currently difficult including bending, sidelying,sitting in the car Baseline:   6/28: 40-50% better Met 8/9:    car 100 %: better sidelying 80% better;   Goal status: ongoing 3.  The patient will have improved hip strength to at least 4/5 needed for standing, walking longer distances and descending stairs at home and in the community  Baseline:  Goal status: ongoing  4.  The patient will have improved FOTO score to   68%     indicating improved function with less pain  Baseline:  Goal status: met 6/28  5. Able to walk >2 miles with pain level <5/10 Met 8/9  1/10  6.  The patient will have improved trunk flexor and extensor muscle strength to at least 4+/5 needed for gardening, picking up limbs, using riding mower, stooping/bending new        PLAN:  PT FREQUENCY: 1x/week  PT DURATION: 12 weeks  PLANNED INTERVENTIONS: Therapeutic exercises, Therapeutic activity, Neuromuscular re-education, Balance training, Gait training, Patient/Family education, Self Care, Joint mobilization, Aquatic Therapy, Dry Needling, Cryotherapy, Moist heat, Taping, Ultrasound, Ionotophoresis 4mg /ml Dexamethasone, Manual therapy, and Re-evaluation  PLAN FOR NEXT SESSION: DN/ES; gluteal and core ex's;  prone HS curls with 5# to HEP;  squatting/sit to stand; step ups for attic ladder; hip hikes off step  Lavinia Sharps, PT 10/27/22 8:30 AM Phone: [phone] Fax: [phone]

## 2022-10-28 ENCOUNTER — Encounter (INDEPENDENT_AMBULATORY_CARE_PROVIDER_SITE_OTHER): Payer: Self-pay

## 2022-11-02 ENCOUNTER — Other Ambulatory Visit: Payer: Self-pay | Admitting: Podiatry

## 2022-11-02 ENCOUNTER — Ambulatory Visit: Payer: Medicare Other | Admitting: Physical Therapy

## 2022-11-02 DIAGNOSIS — M25551 Pain in right hip: Secondary | ICD-10-CM | POA: Diagnosis not present

## 2022-11-02 DIAGNOSIS — M25552 Pain in left hip: Secondary | ICD-10-CM

## 2022-11-02 DIAGNOSIS — M5459 Other low back pain: Secondary | ICD-10-CM | POA: Diagnosis not present

## 2022-11-02 DIAGNOSIS — M6281 Muscle weakness (generalized): Secondary | ICD-10-CM

## 2022-11-02 NOTE — Therapy (Signed)
OUTPATIENT PHYSICAL THERAPY LOWER EXTREMITY PROGRESS NOTE   Patient Name: Lorraine [name] MRN: [id number] DOB:05/03/1950, 72 y.o., female Today's Date: 11/02/2022        PT End of Session - 11/02/22 0747     Visit Number 12    Date for PT Re-Evaluation 11/20/22    Authorization Type Medicare    PT Start Time 0752    PT Stop Time 0835    PT Time Calculation (min) 43 min    Activity Tolerance Patient tolerated treatment well               Past Medical History:  Diagnosis Date   Anxiety    Aortic atherosclerosis (HCC) 02/03/2018   Noted on CT Abd/Pelvis   AR (aortic regurgitation) 07/18/2017   Mild, noted on ECHO   Bilateral cataracts    Bilateral plantar fasciitis    Chronic knee pain    Chronic low back pain    Chronic lumbar radiculopathy 07/14/2017   Mild, L5, noted on electromyography   History of palpitations    2014-- low risk exercise tolerence test, no ischemi, PVC's (09-06-2012)   HSV-1 (herpes simplex virus 1) infection    Cold sore   Hx of colonic polyps    Hyperlipidemia    Insomnia    Lactose intolerance    Left nephrolithiasis 02/03/2018   Nonobstructing, noted on CT Abd/pelvis   Morton's metatarsalgia, neuralgia, or neuroma, bilateral    Osteoarthritis    knees and lumbar, feet   Osteopenia    Peripheral neuropathy    bilateral feet -- burning and stinging   PONV (postoperative nausea and vomiting)    severe   Presence of Watchman left atrial appendage closure device 06/11/2021   Watchman 27mm FLX with Dr. Lalla Brothers   PVC's (premature ventricular contractions)    Systolic murmur    "Slight"   Vaginal cyst    Wears glasses    Past Surgical History:  Procedure Laterality Date   ANTERIOR CERVICAL DECOMP/DISCECTOMY FUSION  01/2014   C4 -- C6   ATRIAL FIBRILLATION ABLATION N/A 04/09/2021   Procedure: ATRIAL FIBRILLATION ABLATION;  Surgeon: Lanier Prude, MD;  Location: [facility];  Service: Cardiovascular;  Laterality: N/A;    BREAST BIOPSY Left 07/17/2018   x2   CATARACT EXTRACTION W/ INTRAOCULAR LENS  IMPLANT, BILATERAL  2015   COLONOSCOPY  05/14/2015   CYSTO WITH HYDRODISTENSION N/A 04/14/2018   Procedure: CYSTOSCOPY/HYDRODISTENSION WITH INSTILLATION OF PYRIDIUM AND MARCAINE, BLADDER BIOPSY WITH FULGERATION 0.5 TO 2 CM;  Surgeon: Jerilee Field, MD;  Location: [facility];  Service: Urology;  Laterality: N/A;   ELBOW DEBRIDEMENT Right 09/10/2010   and tendon debridement and radial tunnel release   EXCISION MORTON'S NEUROMA Bilateral left 02-20-2009/  right & left 05-08-2009   EXCISION VAGINAL CYST N/A 07/01/2016   Procedure: EXCISION VAGINAL CYST;  Surgeon: Ranae Pila, MD;  Location: [facility];  Service: Gynecology;  Laterality: N/A;   LEFT ATRIAL APPENDAGE OCCLUSION N/A 06/11/2021   Procedure: LEFT ATRIAL APPENDAGE OCCLUSION;  Surgeon: Lanier Prude, MD;  Location: [facility];  Service: Cardiovascular;  Laterality: N/A;   PLANTAR FASCIA RELEASE Bilateral left 04-02-2010/  right 04-02-2016   TEE WITHOUT CARDIOVERSION N/A 06/11/2021   Procedure: TRANSESOPHAGEAL ECHOCARDIOGRAM (TEE);  Surgeon: Lanier Prude, MD;  Location: [facility];  Service: Cardiovascular;  Laterality: N/A;   TONSILLECTOMY AND ADENOIDECTOMY  child   TOTAL ABDOMINAL HYSTERECTOMY  1974  w/  Left salpingoophorectomy and Partial right salpingoophorectomy   Patient Active Problem List   Diagnosis Date Noted   Presence of Watchman left atrial appendage closure device 06/12/2021   Atrial fibrillation (HCC) 06/11/2021   Prolapse of female genital organs 07/09/2020   Candidiasis 12/19/2019   Chronic pain of both shoulders 11/28/2019   Malignant neoplasm of lower-inner quadrant of left breast in female, estrogen receptor positive (HCC) 07/26/2018   Anxiety about health 05/31/2018   Paroxysmal atrial fibrillation (HCC) 05/18/2018   Overweight (BMI 25.0-29.9) 05/18/2018    Systolic murmur 07/14/2017   Shingles 11/23/2016   Arthritis 05/26/2016   Insomnia 05/26/2016   Neuropathy 05/26/2016   Positive ANA (antinuclear antibody) 05/26/2016   Hyperlipidemia 05/26/2016   Neck and shoulder pain 01/26/2014   Cervicalgia 01/26/2014   METATARSALGIA 05/06/2010   PLANTAR FASCIITIS, LEFT 05/06/2010   FOOT PAIN, BILATERAL 05/06/2010    PCP: Neena Rhymes MD  REFERRING PROVIDER: Romero Belling MD  REFERRING DIAG: bil glute medius tendonopathy, history of glute tear and right ischial bursa pain  THERAPY DIAG:  Hip pain; weakness Rationale for Evaluation and Treatment: Rehabilitation  ONSET DATE: 03/15/22  SUBJECTIVE:   SUBJECTIVE STATEMENT: The first day after DN is a little rough but 2nd day is great.  Got in the pool yesterday but it has the tendency to bother the hips left > right the next day.  Overall I'm much better.  This weekend I'll do some machines.     New MRI:   "progressive lumbar disc and facet degeneration.  Severe spinal stenosis and mild to moderate right neural foraminal stenosis at L4-5, mild to moderate left lateral recess and left neural foraminal stenosis L1-2"  PERTINENT HISTORY: Several injections in bursa on left over the years, fewer injections on right.  Just finished a Round of prednisone for 6 days helped but the left hip pain has returned.  Tore right glute from yardwork and fell twice years ago and took months to heal.  1 month ago got on recumbent bike at work for 27 min and strained right glute again (ischial area); I don't feel as stable   Does chair yoga; does water exercise on her own at [employer] Walking on the treadmill Resolved A-fib; newly referred to pain clinic for spinal cord stimulator to address peripheral neuropathy in feet;  Chronic lumbar radiculopathy; peripheral neuropathy in feet; cervical fusion Bil knee OA Lots of PT over the year "I didn't get anything out of it"  PAIN:  PAIN:  Are you having pain?  Yes NPRS scale: 2/10 left > right hips and ITBs; LBP Pain location: right ischial tuberosity; left lateral hip trochanteric bursa Aggravating factors: bending down;  sudden movement; sidelying; sitting in the car Relieving factors: walking does not ; movement PRECAUTIONS: None  WEIGHT BEARING RESTRICTIONS: No  FALLS:  Has patient fallen in last 6 months? No   OCCUPATION: working 12 hour shifts  PLOF: Independent  PATIENT GOALS: I told the doctor I didn't need to come but I wanted to do the exercises properly and try the DN (mostly here for the DN)   OBJECTIVE:     PATIENT SURVEYS:  FOTO 65% 6/28  69% goal met taken out of FOTO  COGNITION: Overall cognitive status: Within functional limits for tasks assessed      MUSCLE LENGTH: Hamstrings: Right 70 deg; Left 75 deg Decreased hip flexor length: extension to 5 degrees bil  POSTURE: pelvic drop with attempted SLS <3 sec right/left 8/9:  immediate left pelvic drop but able to SLS 8 sec; minimal drop on right SLS 8 sec  PALPATION: Tender points left lateral glutes; tender points right proximal HS at ischial tuberosity; tender bil ITB  LOWER EXTREMITY ROM: Bil knee flexion 125 degrees  LUMBAR ROM;    LOWER EXTREMITY MMT:  MMT Right eval Left eval 6/28 8/9  Hip flexion 4 4 4  4+  Hip extension 4- 4- 4 4  Hip abduction 3- 3- 4- 4  Hip adduction      Hip internal rotation      Hip external rotation 4- 4- 4 4  Knee flexion 4- 4 4 4+  Knee extension 4 4 4  4+  Ankle dorsiflexion      Ankle plantarflexion      Ankle inversion      Ankle eversion       (Blank rows = not tested)  6/28; TRUNK STRENGTH:  Decreased activation of transverse abdominus muscles; abdominals 4-/5; decreased activation of lumbar multifidi; trunk extensors 4-/5  8/9:  abdominal and trunk extensors grossly 4/5  FUNCTIONAL TESTS:  5x sit to stand no hands 14.02 sec 6/11:  9.51 sec 8/9: 9.45  GAIT:  Comments: pelvic drop, lateral trunk  lean   TODAY'S TREATMENT:  DATE: 8/20 Discussion of return to ex machines this weekend and dosage.   Leg press set up Discussion of knee extension machines (bothers knee) 6 inch forward step ups 5x right/left 6 inch lateral step ups 5x right/left 6 inch glute hip hikes 5x right/left Manual therapy: soft tissue mobilization to bil gluteals, bil ITB Trigger Point Dry-Needling  Treatment instructions: Expect mild to moderate muscle soreness. S/S of pneumothorax if dry needled over a lung field, and to seek immediate medical attention should they occur. Patient verbalized understanding of these instructions and education. Patient Consent Given: Yes Education handout provided: Yes Muscles treated:bil lumbar multifidi, bil gluteals and bil ITB prone Electrical stimulation performed:yes bil lumbar multifid and bil gluteals Parameters:10 min 1.5 ma Treatment response/outcome: improved soft tissue mobility   DATE: 8/14 4 inch forward step ups 8x right/left 4 inch lateral step ups 8x right/left 2 inch glute hip hikes 5x right/left Manual therapy: soft tissue mobilization to bil gluteals, bil ITB Trigger Point Dry-Needling  Treatment instructions: Expect mild to moderate muscle soreness. S/S of pneumothorax if dry needled over a lung field, and to seek immediate medical attention should they occur. Patient verbalized understanding of these instructions and education. Patient Consent Given: Yes Education handout provided: Yes Muscles treated:bil lumbar multifidi, bil gluteals and bil ITB prone Electrical stimulation performed:yes bil lumbar multifid and bil gluteals Parameters:10 min 1.5 ma Treatment response/outcome: improved soft tissue mobility    DATE: 8/9 Discussion of current status and progress SLS: discussed practice for home and strategies to avoid pelvic drop  Strength assessment Manual therapy: soft tissue mobilization to bil gluteals, bil ITB Trigger Point Dry-Needling   Treatment instructions: Expect mild to moderate muscle soreness. S/S of pneumothorax if dry needled over a lung field, and to seek immediate medical attention should they occur. Patient verbalized understanding of these instructions and education.  Patient Consent Given: Yes Education handout provided: Yes Muscles treated:bil lumbar multifidi, bil gluteals and bil ITB prone Electrical stimulation performed:yes bil lumbar multifid and bil gluteals Parameters:10 min 1.5 ma Treatment response/outcome: improved soft tissue mobility   DATE: 8/1 Discussion of current status and recent illness Manual therapy: soft tissue mobilization to bil gluteals, bil ITB Trigger Point Dry-Needling  Treatment instructions:  Expect mild to moderate muscle soreness. S/S of pneumothorax if dry needled over a lung field, and to seek immediate medical attention should they occur. Patient verbalized understanding of these instructions and education.  Patient Consent Given: Yes Education handout provided: Yes Muscles treated:bil lumbar multifidi, bil gluteals and bil ITB prone Electrical stimulation performed:yes bil lumbar multifid and bil gluteals Parameters:10 min 1.5 ma  PATIENT EDUCATION:  Education details: DN info per patient instructions Person educated: Patient Education method: Explanation Education comprehension: verbalized understanding  HOME EXERCISE PROGRAM:  Access Code: [email or web address] URL: [email or web address] Date: 08/05/2022 Prepared by: Lavinia Sharps  Exercises - Clamshell  - 1 x daily - 7 x weekly - 1 sets - 10 reps - Seated Hip Abduction with Resistance  - 1 x daily - 7 x weekly - 1 sets - 10 reps - Hip Flexor Stretch on Step  - 1 x daily - 7 x weekly - 1 sets - 10 reps - Standing Hip Extension with Leg Bent and Support  - 1 x daily - 7 x weekly - 1 sets - 10 reps  ASSESSMENT:  CLINICAL IMPRESSION: Patient reports steady improvement with pain reduction.  She has been able  to return to a modified ex program.   Decreasing tender points in lumbar and hip musculature with excellent response to DN allowing her to progress with exercise.   Therapist monitoring response to all interventions and modifying treatment accordingly.        OBJECTIVE IMPAIRMENTS: decreased activity tolerance, decreased strength, increased fascial restrictions, impaired perceived functional ability, increased muscle spasms, and pain.   ACTIVITY LIMITATIONS: lifting, bending, sitting, squatting, sleeping, stairs, dressing, hygiene/grooming, and locomotion level  PARTICIPATION LIMITATIONS: meal prep, cleaning, laundry, driving, shopping, community activity, and occupation  PERSONAL FACTORS: Past/current experiences, Time since onset of injury/illness/exacerbation, and 3+ comorbidities: multi joint OA, peripheral neuropathy in both feet, chronic back pain  are also affecting patient's functional outcome.   REHAB POTENTIAL: Good  CLINICAL DECISION MAKING: moderate EVALUATION COMPLEXITY: moderate   GOALS: Goals reviewed with patient? Yes  SHORT TERM GOALS: Target date: 08/19/2022   The patient will demonstrate knowledge of basic self care strategies and exercises to promote healing   Baseline: Goal status: met 5/29  2.  The patient will report a 30% improvement in pain levels with functional activities which are currently difficult including bending, sidelying, sitting in the car Baseline:  Goal status: partially met 20%  3.  The patient will have improved hip strength to at least 4-/5 needed for standing, walking longer distances and descending stairs at home and in the community  Baseline:  Goal status: met 6/11  4.  Improved LE strength with 5x sit to stand time improved to <13 sec Baseline:  Goal status: met 6/11  LONG TERM GOALS: Target date: 12/03/2022    The patient will be independent in a safe self progression of a home exercise program to promote further recovery of  function   Baseline:  Goal status: ongoing  2.  The patient will report a 60% improvement in pain levels with functional activities which are currently difficult including bending, sidelying,sitting in the car Baseline:   6/28: 40-50% better Met 8/9:    car 100 %: better sidelying 80% better;   Goal status: ongoing 3.  The patient will have improved hip strength to at least 4/5 needed for standing, walking longer distances and descending stairs at home and in the community  Baseline:  Goal status: ongoing    4.  The patient will have improved FOTO score to   68%     indicating improved function with less pain  Baseline:  Goal status: met 6/28  5. Able to walk >2 miles with pain level <5/10 Met 8/9  1/10  6.  The patient will have improved trunk flexor and extensor muscle strength to at least 4+/5 needed for gardening, picking up limbs, using riding mower, stooping/bending new        PLAN:  PT FREQUENCY: 1x/week  PT DURATION: 12 weeks  PLANNED INTERVENTIONS: Therapeutic exercises, Therapeutic activity, Neuromuscular re-education, Balance training, Gait training, Patient/Family education, Self Care, Joint mobilization, Aquatic Therapy, Dry Needling, Cryotherapy, Moist heat, Taping, Ultrasound, Ionotophoresis 4mg /ml Dexamethasone, Manual therapy, and Re-evaluation  PLAN FOR NEXT SESSION: DN/ES; gluteal and core ex's; squatting/sit to stand; step ups for attic ladder; hip hikes off step; see how ex machines went  Lavinia Sharps, PT 11/02/22 8:45 AM Phone: [phone] Fax: [phone]

## 2022-11-10 ENCOUNTER — Ambulatory Visit: Payer: Medicare Other | Admitting: Physical Therapy

## 2022-11-10 ENCOUNTER — Other Ambulatory Visit: Payer: Self-pay | Admitting: Internal Medicine

## 2022-11-19 ENCOUNTER — Ambulatory Visit: Payer: Medicare Other | Attending: Physical Medicine and Rehabilitation | Admitting: Physical Therapy

## 2022-11-19 ENCOUNTER — Other Ambulatory Visit: Payer: Self-pay

## 2022-11-19 DIAGNOSIS — M25552 Pain in left hip: Secondary | ICD-10-CM | POA: Insufficient documentation

## 2022-11-19 DIAGNOSIS — M5459 Other low back pain: Secondary | ICD-10-CM | POA: Diagnosis not present

## 2022-11-19 DIAGNOSIS — M25551 Pain in right hip: Secondary | ICD-10-CM | POA: Diagnosis not present

## 2022-11-19 DIAGNOSIS — M6281 Muscle weakness (generalized): Secondary | ICD-10-CM | POA: Diagnosis not present

## 2022-11-19 MED ORDER — ALPRAZOLAM 0.5 MG PO TABS: 0.5000 mg | ORAL_TABLET | Freq: Two times a day (BID) | ORAL | 1 refills | Status: DC | PRN

## 2022-11-19 NOTE — Therapy (Signed)
OUTPATIENT PHYSICAL THERAPY LOWER EXTREMITY PROGRESS NOTE/RECERTIFICATION   Patient Name: Lorraine [name] MRN: [id number] DOB:[birth year], 72 y.o., female Today's Date: 11/19/2022        PT End of Session - 11/19/22 0751     Visit Number 13    Date for PT Re-Evaluation 01/14/23    Authorization Type Medicare    PT Start Time 0755    PT Stop Time 0840    PT Time Calculation (min) 45 min    Activity Tolerance Patient tolerated treatment well               Past Medical History:  Diagnosis Date   Anxiety    Aortic atherosclerosis (HCC) 02/03/2018   Noted on CT Abd/Pelvis   AR (aortic regurgitation) 07/18/2017   Mild, noted on ECHO   Bilateral cataracts    Bilateral plantar fasciitis    Chronic knee pain    Chronic low back pain    Chronic lumbar radiculopathy 07/14/2017   Mild, L5, noted on electromyography   History of palpitations    2014-- low risk exercise tolerence test, no ischemi, PVC's (09-06-2012)   HSV-1 (herpes simplex virus 1) infection    Cold sore   Hx of colonic polyps    Hyperlipidemia    Insomnia    Lactose intolerance    Left nephrolithiasis 02/03/2018   Nonobstructing, noted on CT Abd/pelvis   Morton's metatarsalgia, neuralgia, or neuroma, bilateral    Osteoarthritis    knees and lumbar, feet   Osteopenia    Peripheral neuropathy    bilateral feet -- burning and stinging   PONV (postoperative nausea and vomiting)    severe   Presence of Watchman left atrial appendage closure device 06/11/2021   Watchman 27mm FLX with Dr. Lalla Brothers   PVC's (premature ventricular contractions)    Systolic murmur    "Slight"   Vaginal cyst    Wears glasses    Past Surgical History:  Procedure Laterality Date   ANTERIOR CERVICAL DECOMP/DISCECTOMY FUSION  01/2014   C4 -- C6   ATRIAL FIBRILLATION ABLATION N/A 04/09/2021   Procedure: ATRIAL FIBRILLATION ABLATION;  Surgeon: Lanier Prude, MD;  Location: [facility];  Service: Cardiovascular;   Laterality: N/A;   BREAST BIOPSY Left 07/17/2018   x2   CATARACT EXTRACTION W/ INTRAOCULAR LENS  IMPLANT, BILATERAL  2015   COLONOSCOPY  05/14/2015   CYSTO WITH HYDRODISTENSION N/A 04/14/2018   Procedure: CYSTOSCOPY/HYDRODISTENSION WITH INSTILLATION OF PYRIDIUM AND MARCAINE, BLADDER BIOPSY WITH FULGERATION 0.5 TO 2 CM;  Surgeon: Jerilee Field, MD;  Location: [facility];  Service: Urology;  Laterality: N/A;   ELBOW DEBRIDEMENT Right 09/10/2010   and tendon debridement and radial tunnel release   EXCISION MORTON'S NEUROMA Bilateral left 02-20-2009/  right & left 05-08-2009   EXCISION VAGINAL CYST N/A 07/01/2016   Procedure: EXCISION VAGINAL CYST;  Surgeon: Ranae Pila, MD;  Location: [facility];  Service: Gynecology;  Laterality: N/A;   LEFT ATRIAL APPENDAGE OCCLUSION N/A 06/11/2021   Procedure: LEFT ATRIAL APPENDAGE OCCLUSION;  Surgeon: Lanier Prude, MD;  Location: [facility];  Service: Cardiovascular;  Laterality: N/A;   PLANTAR FASCIA RELEASE Bilateral left 04-02-2010/  right 04-02-2016   TEE WITHOUT CARDIOVERSION N/A 06/11/2021   Procedure: TRANSESOPHAGEAL ECHOCARDIOGRAM (TEE);  Surgeon: Lanier Prude, MD;  Location: [facility];  Service: Cardiovascular;  Laterality: N/A;   TONSILLECTOMY AND ADENOIDECTOMY  child   TOTAL ABDOMINAL HYSTERECTOMY  1974  w/  Left salpingoophorectomy and Partial right salpingoophorectomy   Patient Active Problem List   Diagnosis Date Noted   Presence of Watchman left atrial appendage closure device 06/12/2021   Atrial fibrillation (HCC) 06/11/2021   Prolapse of female genital organs 07/09/2020   Candidiasis 12/19/2019   Chronic pain of both shoulders 11/28/2019   Malignant neoplasm of lower-inner quadrant of left breast in female, estrogen receptor positive (HCC) 07/26/2018   Anxiety about health 05/31/2018   Paroxysmal atrial fibrillation (HCC) 05/18/2018   Overweight (BMI 25.0-29.9)  05/18/2018   Systolic murmur 07/14/2017   Shingles 11/23/2016   Arthritis 05/26/2016   Insomnia 05/26/2016   Neuropathy 05/26/2016   Positive ANA (antinuclear antibody) 05/26/2016   Hyperlipidemia 05/26/2016   Neck and shoulder pain 01/26/2014   Cervicalgia 01/26/2014   METATARSALGIA 05/06/2010   PLANTAR FASCIITIS, LEFT 05/06/2010   FOOT PAIN, BILATERAL 05/06/2010    PCP: Neena Rhymes MD  REFERRING PROVIDER: Romero Belling MD  REFERRING DIAG: bil glute medius tendonopathy, history of glute tear and right ischial bursa pain  THERAPY DIAG:  Hip pain; weakness Rationale for Evaluation and Treatment: Rehabilitation  ONSET DATE: 03/15/22  SUBJECTIVE:   SUBJECTIVE STATEMENT: I was doing good but Monday I went to the gym and did the water and got aggressive with walking, jogging, swimming for 45 minutes and had production in back pain and left hip pain.  Did more side strokes than normal.  I'm in a world of hurt right now.  I iced right before I came in.  Took 1/2 of muscle relaxer the last 2 nights.  I've gone backwards.   New MRI:   "progressive lumbar disc and facet degeneration.  Severe spinal stenosis and mild to moderate right neural foraminal stenosis at L4-5, mild to moderate left lateral recess and left neural foraminal stenosis L1-2"  PERTINENT HISTORY: Several injections in bursa on left over the years, fewer injections on right.  Just finished a Round of prednisone for 6 days helped but the left hip pain has returned.  Tore right glute from yardwork and fell twice years ago and took months to heal.  1 month ago got on recumbent bike at work for 27 min and strained right glute again (ischial area); I don't feel as stable   Does chair yoga; does water exercise on her own at [employer] Walking on the treadmill Resolved A-fib; newly referred to pain clinic for spinal cord stimulator to address peripheral neuropathy in feet;  Chronic lumbar radiculopathy; peripheral  neuropathy in feet; cervical fusion Bil knee OA Lots of PT over the year "I didn't get anything out of it"  PAIN:  PAIN:  Are you having pain? Yes NPRS scale: 8/10 left hip and ITB; bil LBP Pain location: right ischial tuberosity; left lateral hip trochanteric bursa Aggravating factors: bending down;  sudden movement; sidelying; sitting in the car Relieving factors: walking does not ; movement PRECAUTIONS: None  WEIGHT BEARING RESTRICTIONS: No  FALLS:  Has patient fallen in last 6 months? No   OCCUPATION: working 12 hour shifts  PLOF: Independent  PATIENT GOALS: I told the doctor I didn't need to come but I wanted to do the exercises properly and try the DN (mostly here for the DN)   OBJECTIVE:     PATIENT SURVEYS:  FOTO 65% 6/28  69% goal met taken out of FOTO  COGNITION: Overall cognitive status: Within functional limits for tasks assessed      MUSCLE LENGTH: Hamstrings: Right 70 deg; Left  75 deg Decreased hip flexor length: extension to 5 degrees bil  POSTURE: pelvic drop with attempted SLS <3 sec right/left 8/9:  immediate left pelvic drop but able to SLS 8 sec; minimal drop on right SLS 8 sec  PALPATION: Tender points left lateral glutes; tender points right proximal HS at ischial tuberosity; tender bil ITB  LOWER EXTREMITY ROM: Bil knee flexion 125 degrees  LUMBAR ROM;    LOWER EXTREMITY MMT:  MMT Right eval Left eval 6/28 8/9 9/6  Hip flexion 4 4 4  4+ 4+  Hip extension 4- 4- 4 4 4+  Hip abduction 3- 3- 4- 4 4  Hip adduction       Hip internal rotation       Hip external rotation 4- 4- 4 4 4+  Knee flexion 4- 4 4 4+ 4+  Knee extension 4 4 4  4+ 4+  Ankle dorsiflexion       Ankle plantarflexion       Ankle inversion       Ankle eversion        (Blank rows = not tested)  6/28; TRUNK STRENGTH:  Decreased activation of transverse abdominus muscles; abdominals 4-/5; decreased activation of lumbar multifidi; trunk extensors 4-/5  8/9:  abdominal  and trunk extensors grossly 4/5  FUNCTIONAL TESTS:  5x sit to stand no hands 14.02 sec 6/11:  9.51 sec 8/9: 9.45  GAIT:  Comments: pelvic drop, lateral trunk lean   TODAY'S TREATMENT:  DATE: 11/19/2022 Discussion of recent flare up and discussion of how to lower intensity of ex in the pool: dec speed of movement, fewer reps, shorter duration Review of overall progress with PT Manual therapy: soft tissue mobilization to bil gluteals, bil ITB Trigger Point Dry-Needling  Treatment instructions: Expect mild to moderate muscle soreness. S/S of pneumothorax if dry needled over a lung field, and to seek immediate medical attention should they occur. Patient verbalized understanding of these instructions and education. Patient Consent Given: Yes Education handout provided: Yes Muscles treated:bil lumbar multifidi, bil gluteals and bil ITB prone Electrical stimulation performed:yes left lumbar multifid; left gluteals; left ITB Parameters:10 min 1.5 ma Treatment response/outcome: improved soft tissue mobility   DATE: 8/20 Discussion of return to ex machines this weekend and dosage.   Leg press set up Discussion of knee extension machines (bothers knee) 6 inch forward step ups 5x right/left 6 inch lateral step ups 5x right/left 6 inch glute hip hikes 5x right/left Manual therapy: soft tissue mobilization to bil gluteals, bil ITB Trigger Point Dry-Needling  Treatment instructions: Expect mild to moderate muscle soreness. S/S of pneumothorax if dry needled over a lung field, and to seek immediate medical attention should they occur. Patient verbalized understanding of these instructions and education. Patient Consent Given: Yes Education handout provided: Yes Muscles treated:bil lumbar multifidi, bil gluteals and bil ITB prone Electrical stimulation performed:yes bil lumbar multifid and bil gluteals Parameters:10 min 1.5 ma Treatment response/outcome: improved soft tissue mobility   DATE:  8/14 4 inch forward step ups 8x right/left 4 inch lateral step ups 8x right/left 2 inch glute hip hikes 5x right/left Manual therapy: soft tissue mobilization to bil gluteals, bil ITB Trigger Point Dry-Needling  Treatment instructions: Expect mild to moderate muscle soreness. S/S of pneumothorax if dry needled over a lung field, and to seek immediate medical attention should they occur. Patient verbalized understanding of these instructions and education. Patient Consent Given: Yes Education handout provided: Yes Muscles treated:bil lumbar multifidi, bil gluteals and bil ITB prone  Electrical stimulation performed:yes bil lumbar multifid and bil gluteals Parameters:10 min 1.5 ma Treatment response/outcome: improved soft tissue mobility    DATE: 8/9 Discussion of current status and progress SLS: discussed practice for home and strategies to avoid pelvic drop  Strength assessment Manual therapy: soft tissue mobilization to bil gluteals, bil ITB Trigger Point Dry-Needling  Treatment instructions: Expect mild to moderate muscle soreness. S/S of pneumothorax if dry needled over a lung field, and to seek immediate medical attention should they occur. Patient verbalized understanding of these instructions and education.  Patient Consent Given: Yes Education handout provided: Yes Muscles treated:bil lumbar multifidi, bil gluteals and bil ITB prone Electrical stimulation performed:yes bil lumbar multifid and bil gluteals Parameters:10 min 1.5 ma Treatment response/outcome: improved soft tissue mobility    PATIENT EDUCATION:  Education details: DN info per patient instructions Person educated: Patient Education method: Explanation Education comprehension: verbalized understanding  HOME EXERCISE PROGRAM:  Access Code: [email or web address] URL: [email or web address] Date: 08/05/2022 Prepared by: Lavinia Sharps  Exercises - Clamshell  - 1 x daily - 7 x weekly - 1 sets - 10 reps -  Seated Hip Abduction with Resistance  - 1 x daily - 7 x weekly - 1 sets - 10 reps - Hip Flexor Stretch on Step  - 1 x daily - 7 x weekly - 1 sets - 10 reps - Standing Hip Extension with Leg Bent and Support  - 1 x daily - 7 x weekly - 1 sets - 10 reps  ASSESSMENT:  CLINICAL IMPRESSION: The patient was steadily improving with pain reduction, increased mobility and function until a flare up on Monday following her pool workout.  Although she regularly ex's in the pool she states she overdid it with the speed of ex's and more side stroke swimming.  She has greatly benefited from DN combined with ES to compliment her ex program.  The patient would benefit from a continuation of skilled PT for a further progression of strengthening and functional mobility.  Will continue to promote independence in a HEP needed for a return to the highest functional level possible with ADLs.         OBJECTIVE IMPAIRMENTS: decreased activity tolerance, decreased strength, increased fascial restrictions, impaired perceived functional ability, increased muscle spasms, and pain.   ACTIVITY LIMITATIONS: lifting, bending, sitting, squatting, sleeping, stairs, dressing, hygiene/grooming, and locomotion level  PARTICIPATION LIMITATIONS: meal prep, cleaning, laundry, driving, shopping, community activity, and occupation  PERSONAL FACTORS: Past/current experiences, Time since onset of injury/illness/exacerbation, and 3+ comorbidities: multi joint OA, peripheral neuropathy in both feet, chronic back pain  are also affecting patient's functional outcome.   REHAB POTENTIAL: Good  CLINICAL DECISION MAKING: moderate EVALUATION COMPLEXITY: moderate   GOALS: Goals reviewed with patient? Yes  SHORT TERM GOALS: Target date: 08/19/2022   The patient will demonstrate knowledge of basic self care strategies and exercises to promote healing   Baseline: Goal status: met 5/29  2.  The patient will report a 30% improvement in pain  levels with functional activities which are currently difficult including bending, sidelying, sitting in the car Baseline:  Goal status: partially met 20%  3.  The patient will have improved hip strength to at least 4-/5 needed for standing, walking longer distances and descending stairs at home and in the community  Baseline:  Goal status: met 6/11  4.  Improved LE strength with 5x sit to stand time improved to <13 sec Baseline:  Goal status: met 6/11  LONG TERM GOALS: Target date: 01/14/2023  The patient will be independent in a safe self progression of a home exercise program to promote further recovery of function   Baseline:  Goal status: ongoing  2.  The patient will report a 60% improvement in pain levels with functional activities which are currently difficult including bending, sidelying,sitting in the car Baseline:   6/28: 40-50% better Met 8/9:    car 100 %: better sidelying 80% better;   Goal status: ongoing 3.  The patient will have improved hip strength to at least 4/5 needed for standing, walking longer distances and descending stairs at home and in the community  Baseline:  Goal status: ongoing    4.  The patient will have improved FOTO score to   68%     indicating improved function with less pain  Baseline:  Goal status: met 6/28  5. Able to walk >2 miles with pain level <5/10 Met 8/9  1/10  6.  The patient will have improved trunk flexor and extensor muscle strength to at least 4+/5 needed for gardening, picking up limbs, using riding mower, stooping/bending new        PLAN:  PT FREQUENCY: 1x/week  PT DURATION: 8 weeks  PLANNED INTERVENTIONS: Therapeutic exercises, Therapeutic activity, Neuromuscular re-education, Balance training, Gait training, Patient/Family education, Self Care, Joint mobilization, Aquatic Therapy, Dry Needling, Cryotherapy, Moist heat, Taping, Ultrasound, Ionotophoresis 4mg /ml Dexamethasone, Manual therapy, and  Re-evaluation  PLAN FOR NEXT SESSION: DN/ES particularly left; gluteal and core ex's; squatting/sit to stand; step ups for attic ladder; hip hikes off step  Lavinia Sharps, PT 11/19/22 12:31 PM Phone: [phone] Fax: [phone]

## 2022-11-19 NOTE — Telephone Encounter (Signed)
Xanax 0.5 mg Requested Prescriptions    No prescriptions requested or ordered in this encounter     Date of patient request: 11/19/22 Last office visit: 10/05/22 Date of last refill: 08/18/22 Last refill amount: 60 Follow up time period per chart: as needed

## 2022-11-25 ENCOUNTER — Ambulatory Visit: Payer: Medicare Other | Admitting: Physical Therapy

## 2022-11-25 DIAGNOSIS — M5459 Other low back pain: Secondary | ICD-10-CM

## 2022-11-25 DIAGNOSIS — M6281 Muscle weakness (generalized): Secondary | ICD-10-CM

## 2022-11-25 DIAGNOSIS — M25551 Pain in right hip: Secondary | ICD-10-CM | POA: Diagnosis not present

## 2022-11-25 DIAGNOSIS — M25552 Pain in left hip: Secondary | ICD-10-CM | POA: Diagnosis not present

## 2022-11-25 NOTE — Therapy (Signed)
OUTPATIENT PHYSICAL THERAPY LOWER EXTREMITY PROGRESS NOTE   Patient Name: Lorraine [name] MRN: [id number] DOB:December 13, 1950, 72 y.o., female Today's Date: 11/25/2022        PT End of Session - 11/25/22 0841     Visit Number 14    Date for PT Re-Evaluation 01/14/23    Authorization Type Medicare    PT Start Time [phone]    PT Stop Time 0930    PT Time Calculation (min) 48 min    Activity Tolerance Patient tolerated treatment well               Past Medical History:  Diagnosis Date   Anxiety    Aortic atherosclerosis (HCC) 02/03/2018   Noted on CT Abd/Pelvis   AR (aortic regurgitation) 07/18/2017   Mild, noted on ECHO   Bilateral cataracts    Bilateral plantar fasciitis    Chronic knee pain    Chronic low back pain    Chronic lumbar radiculopathy 07/14/2017   Mild, L5, noted on electromyography   History of palpitations    2014-- low risk exercise tolerence test, no ischemi, PVC's (09-06-2012)   HSV-1 (herpes simplex virus 1) infection    Cold sore   Hx of colonic polyps    Hyperlipidemia    Insomnia    Lactose intolerance    Left nephrolithiasis 02/03/2018   Nonobstructing, noted on CT Abd/pelvis   Morton's metatarsalgia, neuralgia, or neuroma, bilateral    Osteoarthritis    knees and lumbar, feet   Osteopenia    Peripheral neuropathy    bilateral feet -- burning and stinging   PONV (postoperative nausea and vomiting)    severe   Presence of Watchman left atrial appendage closure device 06/11/2021   Watchman 27mm FLX with Dr. Lalla Brothers   PVC's (premature ventricular contractions)    Systolic murmur    "Slight"   Vaginal cyst    Wears glasses    Past Surgical History:  Procedure Laterality Date   ANTERIOR CERVICAL DECOMP/DISCECTOMY FUSION  01/2014   C4 -- C6   ATRIAL FIBRILLATION ABLATION N/A 04/09/2021   Procedure: ATRIAL FIBRILLATION ABLATION;  Surgeon: Lanier Prude, MD;  Location: [facility];  Service: Cardiovascular;  Laterality: N/A;    BREAST BIOPSY Left 07/17/2018   x2   CATARACT EXTRACTION W/ INTRAOCULAR LENS  IMPLANT, BILATERAL  2015   COLONOSCOPY  05/14/2015   CYSTO WITH HYDRODISTENSION N/A 04/14/2018   Procedure: CYSTOSCOPY/HYDRODISTENSION WITH INSTILLATION OF PYRIDIUM AND MARCAINE, BLADDER BIOPSY WITH FULGERATION 0.5 TO 2 CM;  Surgeon: Jerilee Field, MD;  Location: [facility];  Service: Urology;  Laterality: N/A;   ELBOW DEBRIDEMENT Right 09/10/2010   and tendon debridement and radial tunnel release   EXCISION MORTON'S NEUROMA Bilateral left 02-20-2009/  right & left 05-08-2009   EXCISION VAGINAL CYST N/A 07/01/2016   Procedure: EXCISION VAGINAL CYST;  Surgeon: Ranae Pila, MD;  Location: [facility];  Service: Gynecology;  Laterality: N/A;   LEFT ATRIAL APPENDAGE OCCLUSION N/A 06/11/2021   Procedure: LEFT ATRIAL APPENDAGE OCCLUSION;  Surgeon: Lanier Prude, MD;  Location: [facility];  Service: Cardiovascular;  Laterality: N/A;   PLANTAR FASCIA RELEASE Bilateral left 04-02-2010/  right 04-02-2016   TEE WITHOUT CARDIOVERSION N/A 06/11/2021   Procedure: TRANSESOPHAGEAL ECHOCARDIOGRAM (TEE);  Surgeon: Lanier Prude, MD;  Location: [facility] INVASIVE CV LAB;  Service: Cardiovascular;  Laterality: N/A;   TONSILLECTOMY AND ADENOIDECTOMY  child   TOTAL ABDOMINAL HYSTERECTOMY  1974  w/  Left salpingoophorectomy and Partial right salpingoophorectomy   Patient Active Problem List   Diagnosis Date Noted   Presence of Watchman left atrial appendage closure device 06/12/2021   Atrial fibrillation (HCC) 06/11/2021   Prolapse of female genital organs 07/09/2020   Candidiasis 12/19/2019   Chronic pain of both shoulders 11/28/2019   Malignant neoplasm of lower-inner quadrant of left breast in female, estrogen receptor positive (HCC) 07/26/2018   Anxiety about health 05/31/2018   Paroxysmal atrial fibrillation (HCC) 05/18/2018   Overweight (BMI 25.0-29.9) 05/18/2018    Systolic murmur 07/14/2017   Shingles 11/23/2016   Arthritis 05/26/2016   Insomnia 05/26/2016   Neuropathy 05/26/2016   Positive ANA (antinuclear antibody) 05/26/2016   Hyperlipidemia 05/26/2016   Neck and shoulder pain 01/26/2014   Cervicalgia 01/26/2014   METATARSALGIA 05/06/2010   PLANTAR FASCIITIS, LEFT 05/06/2010   FOOT PAIN, BILATERAL 05/06/2010    PCP: Neena Rhymes MD  REFERRING PROVIDER: Romero Belling MD  REFERRING DIAG: bil glute medius tendonopathy, history of glute tear and right ischial bursa pain  THERAPY DIAG:  Hip pain; weakness Rationale for Evaluation and Treatment: Rehabilitation  ONSET DATE: 03/15/22  SUBJECTIVE:   SUBJECTIVE STATEMENT: I see the [facility] at the end of the month.  I took a heating pad, a lot of pain on Monday.  I took a prednisone the last 3 days and that really helped.  The DN helps but I was just in bad shape this week.  Strain in low back with moving plants around.  Reports she's done a little yoga and light hip ex's just not to the gym    New MRI:   "progressive lumbar disc and facet degeneration.  Severe spinal stenosis and mild to moderate right neural foraminal stenosis at L4-5, mild to moderate left lateral recess and left neural foraminal stenosis L1-2"  PERTINENT HISTORY: Several injections in bursa on left over the years, fewer injections on right.  Just finished a Round of prednisone for 6 days helped but the left hip pain has returned.  Tore right glute from yardwork and fell twice years ago and took months to heal.  1 month ago got on recumbent bike at work for 27 min and strained right glute again (ischial area); I don't feel as stable   Does chair yoga; does water exercise on her own at [employer] Walking on the treadmill Resolved A-fib; newly referred to pain clinic for spinal cord stimulator to address peripheral neuropathy in feet;  Chronic lumbar radiculopathy; peripheral neuropathy in feet; cervical fusion Bil knee  OA Lots of PT over the year "I didn't get anything out of it"  PAIN:  PAIN:  Are you having pain? Yes NPRS scale: 1-2/10 left hip and ITB; bil LBP Pain location: right ischial tuberosity; left lateral hip trochanteric bursa Aggravating factors: bending down;  sudden movement; sidelying; sitting in the car Relieving factors: walking does not ; movement PRECAUTIONS: None  WEIGHT BEARING RESTRICTIONS: No  FALLS:  Has patient fallen in last 6 months? No   OCCUPATION: working 12 hour shifts  PLOF: Independent  PATIENT GOALS: I told the doctor I didn't need to come but I wanted to do the exercises properly and try the DN (mostly here for the DN)   OBJECTIVE:     PATIENT SURVEYS:  FOTO 65% 6/28  69% goal met taken out of FOTO  COGNITION: Overall cognitive status: Within functional limits for tasks assessed      MUSCLE LENGTH: Hamstrings: Right 70 deg;  Left 75 deg Decreased hip flexor length: extension to 5 degrees bil  POSTURE: pelvic drop with attempted SLS <3 sec right/left 8/9:  immediate left pelvic drop but able to SLS 8 sec; minimal drop on right SLS 8 sec  PALPATION: Tender points left lateral glutes; tender points right proximal HS at ischial tuberosity; tender bil ITB  LOWER EXTREMITY ROM: Bil knee flexion 125 degrees  LUMBAR ROM;    LOWER EXTREMITY MMT:  MMT Right eval Left eval 6/28 8/9 9/6  Hip flexion 4 4 4  4+ 4+  Hip extension 4- 4- 4 4 4+  Hip abduction 3- 3- 4- 4 4  Hip adduction       Hip internal rotation       Hip external rotation 4- 4- 4 4 4+  Knee flexion 4- 4 4 4+ 4+  Knee extension 4 4 4  4+ 4+  Ankle dorsiflexion       Ankle plantarflexion       Ankle inversion       Ankle eversion        (Blank rows = not tested)  6/28; TRUNK STRENGTH:  Decreased activation of transverse abdominus muscles; abdominals 4-/5; decreased activation of lumbar multifidi; trunk extensors 4-/5  8/9:  abdominal and trunk extensors grossly  4/5  FUNCTIONAL TESTS:  5x sit to stand no hands 14.02 sec 6/11:  9.51 sec 8/9: 9.45  GAIT:  Comments: pelvic drop, lateral trunk lean   TODAY'S TREATMENT:  DATE: 11/25/2022 Discussion of recent flare up  Discussion of current mobility and exercise Manual therapy: soft tissue mobilization to bil gluteals, bil ITB Trigger Point Dry-Needling  Treatment instructions: Expect mild to moderate muscle soreness. S/S of pneumothorax if dry needled over a lung field, and to seek immediate medical attention should they occur. Patient verbalized understanding of these instructions and education. Patient Consent Given: Yes Education handout provided: Yes Muscles treated:bil lumbar multifidi, bil gluteals and bil ITB prone Electrical stimulation performed:yes left lumbar multifid; left gluteals; left ITB Parameters:10 min 1.5 ma Treatment response/outcome: improved soft tissue mobility    DATE: 11/19/2022 Discussion of recent flare up and discussion of how to lower intensity of ex in the pool: dec speed of movement, fewer reps, shorter duration Review of overall progress with PT Manual therapy: soft tissue mobilization to bil gluteals, bil ITB Trigger Point Dry-Needling  Treatment instructions: Expect mild to moderate muscle soreness. S/S of pneumothorax if dry needled over a lung field, and to seek immediate medical attention should they occur. Patient verbalized understanding of these instructions and education. Patient Consent Given: Yes Education handout provided: Yes Muscles treated:bil lumbar multifidi, bil gluteals and bil ITB prone Electrical stimulation performed:yes left lumbar multifid; left gluteals; left ITB Parameters:10 min 1.5 ma Treatment response/outcome: improved soft tissue mobility   DATE: 8/20 Discussion of return to ex machines this weekend and dosage.   Leg press set up Discussion of knee extension machines (bothers knee) 6 inch forward step ups 5x right/left 6 inch  lateral step ups 5x right/left 6 inch glute hip hikes 5x right/left Manual therapy: soft tissue mobilization to bil gluteals, bil ITB Trigger Point Dry-Needling  Treatment instructions: Expect mild to moderate muscle soreness. S/S of pneumothorax if dry needled over a lung field, and to seek immediate medical attention should they occur. Patient verbalized understanding of these instructions and education. Patient Consent Given: Yes Education handout provided: Yes Muscles treated:bil lumbar multifidi, bil gluteals and bil ITB prone Electrical stimulation performed:yes bil lumbar multifid  and bil gluteals Parameters:10 min 1.5 ma Treatment response/outcome: improved soft tissue mobility   DATE: 8/14 4 inch forward step ups 8x right/left 4 inch lateral step ups 8x right/left 2 inch glute hip hikes 5x right/left Manual therapy: soft tissue mobilization to bil gluteals, bil ITB Trigger Point Dry-Needling  Treatment instructions: Expect mild to moderate muscle soreness. S/S of pneumothorax if dry needled over a lung field, and to seek immediate medical attention should they occur. Patient verbalized understanding of these instructions and education. Patient Consent Given: Yes Education handout provided: Yes Muscles treated:bil lumbar multifidi, bil gluteals and bil ITB prone Electrical stimulation performed:yes bil lumbar multifid and bil gluteals Parameters:10 min 1.5 ma Treatment response/outcome: improved soft tissue mobility    PATIENT EDUCATION:  Education details: DN info per patient instructions Person educated: Patient Education method: Explanation Education comprehension: verbalized understanding  HOME EXERCISE PROGRAM:  Access Code: [email or web address] URL: [email or web address] Date: 08/05/2022 Prepared by: Lavinia Sharps  Exercises - Clamshell  - 1 x daily - 7 x weekly - 1 sets - 10 reps - Seated Hip Abduction with Resistance  - 1 x daily - 7 x weekly - 1 sets - 10  reps - Hip Flexor Stretch on Step  - 1 x daily - 7 x weekly - 1 sets - 10 reps - Standing Hip Extension with Leg Bent and Support  - 1 x daily - 7 x weekly - 1 sets - 10 reps  ASSESSMENT:  CLINICAL IMPRESSION: Improving following recent flare up.  Decreased tender points overall compared to last visit.  She finds DN combined with ES to be especially helpful and it allows her to perform yoga and light exercise as well as light gardening activities afterwards.  Therapist monitoring response to all interventions and modifying treatment based on symptoms.    OBJECTIVE IMPAIRMENTS: decreased activity tolerance, decreased strength, increased fascial restrictions, impaired perceived functional ability, increased muscle spasms, and pain.   ACTIVITY LIMITATIONS: lifting, bending, sitting, squatting, sleeping, stairs, dressing, hygiene/grooming, and locomotion level  PARTICIPATION LIMITATIONS: meal prep, cleaning, laundry, driving, shopping, community activity, and occupation  PERSONAL FACTORS: Past/current experiences, Time since onset of injury/illness/exacerbation, and 3+ comorbidities: multi joint OA, peripheral neuropathy in both feet, chronic back pain  are also affecting patient's functional outcome.   REHAB POTENTIAL: Good  CLINICAL DECISION MAKING: moderate EVALUATION COMPLEXITY: moderate   GOALS: Goals reviewed with patient? Yes  SHORT TERM GOALS: Target date: 08/19/2022   The patient will demonstrate knowledge of basic self care strategies and exercises to promote healing   Baseline: Goal status: met 5/29  2.  The patient will report a 30% improvement in pain levels with functional activities which are currently difficult including bending, sidelying, sitting in the car Baseline:  Goal status: partially met 20%  3.  The patient will have improved hip strength to at least 4-/5 needed for standing, walking longer distances and descending stairs at home and in the community   Baseline:  Goal status: met 6/11  4.  Improved LE strength with 5x sit to stand time improved to <13 sec Baseline:  Goal status: met 6/11  LONG TERM GOALS: Target date: 01/14/2023    The patient will be independent in a safe self progression of a home exercise program to promote further recovery of function   Baseline:  Goal status: ongoing  2.  The patient will report a 60% improvement in pain levels with functional activities which are currently difficult including bending, sidelying,sitting in the car Baseline:  6/28: 40-50% better Met 8/9:    car 100 %: better sidelying 80% better;   Goal status: ongoing 3.  The patient will have improved hip strength to at least 4/5 needed for standing, walking longer distances and descending stairs at home and in the community  Baseline:  Goal status: ongoing    4.  The patient will have improved FOTO score to   68%     indicating improved function with less pain  Baseline:  Goal status: met 6/28  5. Able to walk >2 miles with pain level <5/10 Met 8/9  1/10  6.  The patient will have improved trunk flexor and extensor muscle strength to at least 4+/5 needed for gardening, picking up limbs, using riding mower, stooping/bending new        PLAN:  PT FREQUENCY: 1x/week  PT DURATION: 8 weeks  PLANNED INTERVENTIONS: Therapeutic exercises, Therapeutic activity, Neuromuscular re-education, Balance training, Gait training, Patient/Family education, Self Care, Joint mobilization, Aquatic Therapy, Dry Needling, Cryotherapy, Moist heat, Taping, Ultrasound, Ionotophoresis 4mg /ml Dexamethasone, Manual therapy, and Re-evaluation  PLAN FOR NEXT SESSION: DN/ES particularly left; gluteal and core ex's; squatting/sit to stand; step ups for attic ladder; hip hikes off step  Lavinia Sharps, PT 11/25/22 9:48 AM Phone: [phone] Fax: [phone]

## 2022-11-28 ENCOUNTER — Other Ambulatory Visit: Payer: Self-pay | Admitting: Podiatry

## 2022-11-29 DIAGNOSIS — Z23 Encounter for immunization: Secondary | ICD-10-CM | POA: Diagnosis not present

## 2022-12-02 DIAGNOSIS — M5416 Radiculopathy, lumbar region: Secondary | ICD-10-CM | POA: Diagnosis not present

## 2022-12-03 ENCOUNTER — Encounter: Payer: Medicare Other | Admitting: Physical Therapy

## 2022-12-07 ENCOUNTER — Other Ambulatory Visit: Payer: Self-pay

## 2022-12-07 ENCOUNTER — Other Ambulatory Visit: Payer: Self-pay | Admitting: Internal Medicine

## 2022-12-07 MED ORDER — VALACYCLOVIR HCL 1 G PO TABS: 1000.0000 mg | ORAL_TABLET | Freq: Every day | ORAL | 5 refills | Status: DC

## 2022-12-08 ENCOUNTER — Ambulatory Visit (INDEPENDENT_AMBULATORY_CARE_PROVIDER_SITE_OTHER): Payer: Medicare Other | Admitting: Family Medicine

## 2022-12-08 ENCOUNTER — Encounter: Payer: Self-pay | Admitting: Family Medicine

## 2022-12-08 VITALS — BP 128/70 | HR 76 | Temp 97.9°F | Ht 63.0 in | Wt 188.6 lb

## 2022-12-08 DIAGNOSIS — Z6833 Body mass index (BMI) 33.0-33.9, adult: Secondary | ICD-10-CM | POA: Diagnosis not present

## 2022-12-08 DIAGNOSIS — M545 Low back pain, unspecified: Secondary | ICD-10-CM | POA: Diagnosis not present

## 2022-12-08 DIAGNOSIS — G8929 Other chronic pain: Secondary | ICD-10-CM | POA: Diagnosis not present

## 2022-12-08 DIAGNOSIS — E785 Hyperlipidemia, unspecified: Secondary | ICD-10-CM | POA: Diagnosis not present

## 2022-12-08 DIAGNOSIS — E669 Obesity, unspecified: Secondary | ICD-10-CM | POA: Diagnosis not present

## 2022-12-08 LAB — BASIC METABOLIC PANEL
BUN: 23 mg/dL (ref 6–23)
CO2: 24 mEq/L (ref 19–32)
Calcium: 9.3 mg/dL (ref 8.4–10.5)
Chloride: 109 mEq/L (ref 96–112)
Creatinine, Ser: 0.64 mg/dL (ref 0.40–1.20)
GFR: 88.27 mL/min (ref >60.00)
Glucose, Bld: 99 mg/dL (ref 70–99)
Potassium: 4.1 mEq/L (ref 3.5–5.1)
Sodium: 141 mEq/L (ref 135–145)

## 2022-12-08 LAB — HEPATIC FUNCTION PANEL
ALT: 23 U/L (ref 0–35)
AST: 16 U/L (ref 0–37)
Albumin: 4.1 g/dL (ref 3.5–5.2)
Alkaline Phosphatase: 80 U/L (ref 39–117)
Bilirubin, Direct: 0.1 mg/dL (ref 0.0–0.3)
Total Bilirubin: 0.9 mg/dL (ref 0.2–1.2)
Total Protein: 6.6 g/dL (ref 6.0–8.3)

## 2022-12-08 LAB — LIPID PANEL
Cholesterol: 191 mg/dL (ref 0–200)
HDL: 61.6 mg/dL (ref >39.00)
LDL Cholesterol: 114 mg/dL — ABNORMAL HIGH (ref 0–99)
NonHDL: 129.36
Total CHOL/HDL Ratio: 3
Triglycerides: 75 mg/dL (ref 0.0–149.0)
VLDL: 15 mg/dL (ref 0.0–40.0)

## 2022-12-08 MED ORDER — TRAMADOL HCL 50 MG PO TABS
50.0000 mg | ORAL_TABLET | Freq: Three times a day (TID) | ORAL | 0 refills | Status: AC | PRN
Start: 2022-12-08 — End: 2022-12-13

## 2022-12-08 MED ORDER — VALACYCLOVIR HCL 1 G PO TABS: 1000.0000 mg | ORAL_TABLET | Freq: Every day | ORAL | 5 refills | Status: DC

## 2022-12-08 MED ORDER — MELOXICAM 15 MG PO TABS: 15.0000 mg | ORAL_TABLET | Freq: Every day | ORAL | 1 refills | Status: DC

## 2022-12-08 NOTE — Assessment & Plan Note (Signed)
Chronic problem.  On Crestor 5mg  daily.  She stopped taking Zetia when she got sick this summer.  Check labs.  Adjust meds prn

## 2022-12-08 NOTE — Patient Instructions (Addendum)
Follow up in 6 months to recheck cholesterol We'll notify you of your lab results and make any changes if needed Continue to work on healthy diet and regular exercise- you can do it! Use the Tramadol as needed for severe breakthrough pain Call with any questions or concerns Stay Safe!  Stay Healthy! Happy Fall!!!

## 2022-12-08 NOTE — Assessment & Plan Note (Signed)
New.  Pt has gained 6 lbs since last visit and BMI now 33.41  Encouraged regular exercise and low carb diet.  Will continue to follow.

## 2022-12-08 NOTE — Assessment & Plan Note (Signed)
Chronic problem.  Following w/ Dr Maurice Small for injxns but continues to have breakthrough pain.  Was going to have a spinal stimulator implanted this summer but she got sick in July and this had to be postponed.  Meloxicam dose increased now that she is not on anticoagulation.  Tramadol provided to use for severe breakthrough pain.  Pt expressed understanding and is in agreement w/ plan.

## 2022-12-08 NOTE — Progress Notes (Signed)
Subjective:    Patient ID: Lorraine [name], female    DOB: 03/29/1950, 72 y.o.   MRN: [id number]  HPI Hyperlipidemia- ongoing issue for pt.  On Crestor 5mg  daily.  Stopped taking Zetia.  Denies CP, SOB, abd pain, N/V.  Obesity- pt has gained 6 lbs since last visit.  BMI now 33.41.  no regular exercise due to fatigue.  Low back pain- pt is getting injections w/ Dr Maurice Small.  Continues to have breakthrough pain.  Asking to increase Meloxicam to 15mg  daily.  Would like to have Tramadol on hand to use for severe pain.   Review of Systems For ROS see HPI     Objective:   Physical Exam Vitals reviewed.  Constitutional:      General: She is not in acute distress.    Appearance: Normal appearance. She is well-developed. She is not ill-appearing.  HENT:     Head: Normocephalic and atraumatic.     Right Ear: Tympanic membrane and ear canal normal.     Left Ear: Tympanic membrane and ear canal normal.     Nose: No congestion.     Mouth/Throat:     Mouth: Mucous membranes are moist.     Pharynx: No oropharyngeal exudate or posterior oropharyngeal erythema.  Eyes:     Extraocular Movements: Extraocular movements intact.     Conjunctiva/sclera: Conjunctivae normal.     Pupils: Pupils are equal, round, and reactive to light.  Neck:     Thyroid: No thyromegaly.  Cardiovascular:     Rate and Rhythm: Normal rate and regular rhythm.     Pulses: Normal pulses.     Heart sounds: Normal heart sounds. No murmur heard. Pulmonary:     Effort: Pulmonary effort is normal. No respiratory distress.     Breath sounds: Normal breath sounds.  Abdominal:     General: There is no distension.     Palpations: Abdomen is soft.     Tenderness: There is no abdominal tenderness.  Musculoskeletal:     Cervical back: Normal range of motion and neck supple.     Right lower leg: No edema.     Left lower leg: No edema.  Lymphadenopathy:     Cervical: No cervical adenopathy.  Skin:    General: Skin is warm  and dry.  Neurological:     General: No focal deficit present.     Mental Status: She is alert and oriented to person, place, and time.  Psychiatric:        Mood and Affect: Mood normal.        Behavior: Behavior normal.        Thought Content: Thought content normal.           Assessment & Plan:

## 2022-12-09 ENCOUNTER — Ambulatory Visit: Payer: Medicare Other | Admitting: Physical Therapy

## 2022-12-09 DIAGNOSIS — M6281 Muscle weakness (generalized): Secondary | ICD-10-CM

## 2022-12-09 DIAGNOSIS — M25552 Pain in left hip: Secondary | ICD-10-CM | POA: Diagnosis not present

## 2022-12-09 DIAGNOSIS — M25551 Pain in right hip: Secondary | ICD-10-CM | POA: Diagnosis not present

## 2022-12-09 DIAGNOSIS — M5459 Other low back pain: Secondary | ICD-10-CM

## 2022-12-09 NOTE — Therapy (Signed)
OUTPATIENT PHYSICAL THERAPY LOWER EXTREMITY PROGRESS NOTE   Patient Name: Lorraine [name] MRN: [id number] DOB:1951-01-24, 72 y.o., female Today's Date: 12/09/2022        PT End of Session - 12/09/22 0746     Visit Number 15    Date for PT Re-Evaluation 01/14/23    Authorization Type Medicare    PT Start Time 0755    PT Stop Time 0842    PT Time Calculation (min) 47 min    Activity Tolerance Patient tolerated treatment well               Past Medical History:  Diagnosis Date   Anxiety    Aortic atherosclerosis (HCC) 02/03/2018   Noted on CT Abd/Pelvis   AR (aortic regurgitation) 07/18/2017   Mild, noted on ECHO   Bilateral cataracts    Bilateral plantar fasciitis    Chronic knee pain    Chronic low back pain    Chronic lumbar radiculopathy 07/14/2017   Mild, L5, noted on electromyography   History of palpitations    2014-- low risk exercise tolerence test, no ischemi, PVC's (09-06-2012)   HSV-1 (herpes simplex virus 1) infection    Cold sore   Hx of colonic polyps    Hyperlipidemia    Insomnia    Lactose intolerance    Left nephrolithiasis 02/03/2018   Nonobstructing, noted on CT Abd/pelvis   Morton's metatarsalgia, neuralgia, or neuroma, bilateral    Osteoarthritis    knees and lumbar, feet   Osteopenia    Peripheral neuropathy    bilateral feet -- burning and stinging   PONV (postoperative nausea and vomiting)    severe   Presence of Watchman left atrial appendage closure device 06/11/2021   Watchman 27mm FLX with Dr. Lalla Brothers   PVC's (premature ventricular contractions)    Systolic murmur    "Slight"   Vaginal cyst    Wears glasses    Past Surgical History:  Procedure Laterality Date   ANTERIOR CERVICAL DECOMP/DISCECTOMY FUSION  01/2014   C4 -- C6   ATRIAL FIBRILLATION ABLATION N/A 04/09/2021   Procedure: ATRIAL FIBRILLATION ABLATION;  Surgeon: Lanier Prude, MD;  Location: [facility];  Service: Cardiovascular;  Laterality: N/A;    BREAST BIOPSY Left 07/17/2018   x2   CATARACT EXTRACTION W/ INTRAOCULAR LENS  IMPLANT, BILATERAL  2015   COLONOSCOPY  05/14/2015   CYSTO WITH HYDRODISTENSION N/A 04/14/2018   Procedure: CYSTOSCOPY/HYDRODISTENSION WITH INSTILLATION OF PYRIDIUM AND MARCAINE, BLADDER BIOPSY WITH FULGERATION 0.5 TO 2 CM;  Surgeon: Jerilee Field, MD;  Location:  Hospital;  Service: Urology;  Laterality: N/A;   ELBOW DEBRIDEMENT Right 09/10/2010   and tendon debridement and radial tunnel release   EXCISION MORTON'S NEUROMA Bilateral left 02-20-2009/  right & left 05-08-2009   EXCISION VAGINAL CYST N/A 07/01/2016   Procedure: EXCISION VAGINAL CYST;  Surgeon: Ranae Pila, MD;  Location: [facility];  Service: Gynecology;  Laterality: N/A;   LEFT ATRIAL APPENDAGE OCCLUSION N/A 06/11/2021   Procedure: LEFT ATRIAL APPENDAGE OCCLUSION;  Surgeon: Lanier Prude, MD;  Location: [facility];  Service: Cardiovascular;  Laterality: N/A;   PLANTAR FASCIA RELEASE Bilateral left 04-02-2010/  right 04-02-2016   TEE WITHOUT CARDIOVERSION N/A 06/11/2021   Procedure: TRANSESOPHAGEAL ECHOCARDIOGRAM (TEE);  Surgeon: Lanier Prude, MD;  Location: [facility]IVE CV [facility];  Service: Cardiovascular;  Laterality: N/A;   TONSILLECTOMY AND ADENOIDECTOMY  child   TOTAL ABDOMINAL HYSTERECTOMY  1974  w/  Left salpingoophorectomy and Partial right salpingoophorectomy   Patient Active Problem List   Diagnosis Date Noted   Obesity (BMI 30-39.9) 12/08/2022   Chronic bilateral low back pain without sciatica 12/08/2022   Presence of Watchman left atrial appendage closure device 06/12/2021   Atrial fibrillation (HCC) 06/11/2021   Prolapse of female genital organs 07/09/2020   Candidiasis 12/19/2019   Chronic pain of both shoulders 11/28/2019   Malignant neoplasm of lower-inner quadrant of left breast in female, estrogen receptor positive (HCC) 07/26/2018   Anxiety about health 05/31/2018    Paroxysmal atrial fibrillation (HCC) 05/18/2018   Systolic murmur 07/14/2017   Shingles 11/23/2016   Arthritis 05/26/2016   Insomnia 05/26/2016   Neuropathy 05/26/2016   Positive ANA (antinuclear antibody) 05/26/2016   Hyperlipidemia 05/26/2016   Neck and shoulder pain 01/26/2014   Cervicalgia 01/26/2014   METATARSALGIA 05/06/2010   PLANTAR FASCIITIS, LEFT 05/06/2010   FOOT PAIN, BILATERAL 05/06/2010    PCP: Neena Rhymes MD  REFERRING PROVIDER: Romero Belling MD  REFERRING DIAG: bil glute medius tendonopathy, history of glute tear and right ischial bursa pain  THERAPY DIAG:  Hip pain; weakness Rationale for Evaluation and Treatment: Rehabilitation  ONSET DATE: 03/15/22  SUBJECTIVE:   SUBJECTIVE STATEMENT: Dr. Maurice Small gave me injections last week to L5 and L1.  My low back is still hurting and the sides too.  I'm going to go back to the pool and just walk when I leave here.  Some walking and yoga this week.     New MRI:   "progressive lumbar disc and facet degeneration.  Severe spinal stenosis and mild to moderate right neural foraminal stenosis at L4-5, mild to moderate left lateral recess and left neural foraminal stenosis L1-2"  PERTINENT HISTORY: Several injections in bursa on left over the years, fewer injections on right.  Just finished a Round of prednisone for 6 days helped but the left hip pain has returned.  Tore right glute from yardwork and fell twice years ago and took months to heal.  1 month ago got on recumbent bike at work for 27 min and strained right glute again (ischial area); I don't feel as stable   Does chair yoga; does water exercise on her own at [employer] Walking on the treadmill Resolved A-fib; newly referred to pain clinic for spinal cord stimulator to address peripheral neuropathy in feet;  Chronic lumbar radiculopathy; peripheral neuropathy in feet; cervical fusion Bil knee OA Lots of PT over the year "I didn't get anything out of it"   PAIN:  PAIN:  Are you having pain? Yes NPRS scale: 3/10 left hip > right hip and ITB; bil LBP Pain location: right ischial tuberosity; left lateral hip trochanteric bursa Aggravating factors: bending down;  sudden movement; sidelying; sitting in the car Relieving factors: walking does not ; movement PRECAUTIONS: None  WEIGHT BEARING RESTRICTIONS: No  FALLS:  Has patient fallen in last 6 months? No   OCCUPATION: working 12 hour shifts  PLOF: Independent  PATIENT GOALS: I told the doctor I didn't need to come but I wanted to do the exercises properly and try the DN (mostly here for the DN)   OBJECTIVE:     PATIENT SURVEYS:  FOTO 65% 6/28  69% goal met taken out of FOTO  COGNITION: Overall cognitive status: Within functional limits for tasks assessed      MUSCLE LENGTH: Hamstrings: Right 70 deg; Left 75 deg Decreased hip flexor length: extension to 5 degrees bil  POSTURE:  pelvic drop with attempted SLS <3 sec right/left 8/9:  immediate left pelvic drop but able to SLS 8 sec; minimal drop on right SLS 8 sec  PALPATION: Tender points left lateral glutes; tender points right proximal HS at ischial tuberosity; tender bil ITB  LOWER EXTREMITY ROM: Bil knee flexion 125 degrees  LUMBAR ROM;    LOWER EXTREMITY MMT:  MMT Right eval Left eval 6/28 8/9 9/6  Hip flexion 4 4 4  4+ 4+  Hip extension 4- 4- 4 4 4+  Hip abduction 3- 3- 4- 4 4  Hip adduction       Hip internal rotation       Hip external rotation 4- 4- 4 4 4+  Knee flexion 4- 4 4 4+ 4+  Knee extension 4 4 4  4+ 4+  Ankle dorsiflexion       Ankle plantarflexion       Ankle inversion       Ankle eversion        (Blank rows = not tested)  6/28; TRUNK STRENGTH:  Decreased activation of transverse abdominus muscles; abdominals 4-/5; decreased activation of lumbar multifidi; trunk extensors 4-/5  8/9:  abdominal and trunk extensors grossly 4/5  FUNCTIONAL TESTS:  5x sit to stand no hands 14.02 sec 6/11:   9.51 sec 8/9: 9.45 normal range  GAIT:  Comments: pelvic drop, lateral trunk lean   TODAY'S TREATMENT:  DATE: 12/09/2022 Discussion of current mobility and exercise Discussed trial of strengthening at the gym: max of 10 reps at moderate intensity 3 LE ex alternating with 3 UE exs Manual therapy: soft tissue mobilization to bil gluteals, bil ITB Trigger Point Dry-Needling  Treatment instructions: Expect mild to moderate muscle soreness. S/S of pneumothorax if dry needled over a lung field, and to seek immediate medical attention should they occur. Patient verbalized understanding of these instructions and education. Patient Consent Given: Yes Education handout provided: Yes Muscles treated:bil lumbar multifidi, bil gluteals and bil ITB prone Electrical stimulation performed:yes bil lumbar multifid; bil gluteals Parameters:10 min 1.5 ma Treatment response/outcome: improved soft tissue mobility   DATE: 11/25/2022 Discussion of recent flare up  Discussion of current mobility and exercise Manual therapy: soft tissue mobilization to bil gluteals, bil ITB Trigger Point Dry-Needling  Treatment instructions: Expect mild to moderate muscle soreness. S/S of pneumothorax if dry needled over a lung field, and to seek immediate medical attention should they occur. Patient verbalized understanding of these instructions and education. Patient Consent Given: Yes Education handout provided: Yes Muscles treated:bil lumbar multifidi, bil gluteals and bil ITB prone Electrical stimulation performed:yes left lumbar multifid; left gluteals; left ITB Parameters:10 min 1.5 ma Treatment response/outcome: improved soft tissue mobility    DATE: 11/19/2022 Discussion of recent flare up and discussion of how to lower intensity of ex in the pool: dec speed of movement, fewer reps, shorter duration Review of overall progress with PT Manual therapy: soft tissue mobilization to bil gluteals, bil ITB Trigger Point  Dry-Needling  Treatment instructions: Expect mild to moderate muscle soreness. S/S of pneumothorax if dry needled over a lung field, and to seek immediate medical attention should they occur. Patient verbalized understanding of these instructions and education. Patient Consent Given: Yes Education handout provided: Yes Muscles treated:bil lumbar multifidi, bil gluteals and bil ITB prone Electrical stimulation performed:yes left lumbar multifid; left gluteals; left ITB Parameters:10 min 1.5 ma Treatment response/outcome: improved soft tissue mobility   DATE: 8/20 Discussion of return to ex machines this weekend and dosage.  Leg press set up Discussion of knee extension machines (bothers knee) 6 inch forward step ups 5x right/left 6 inch lateral step ups 5x right/left 6 inch glute hip hikes 5x right/left Manual therapy: soft tissue mobilization to bil gluteals, bil ITB Trigger Point Dry-Needling  Treatment instructions: Expect mild to moderate muscle soreness. S/S of pneumothorax if dry needled over a lung field, and to seek immediate medical attention should they occur. Patient verbalized understanding of these instructions and education. Patient Consent Given: Yes Education handout provided: Yes Muscles treated:bil lumbar multifidi, bil gluteals and bil ITB prone Electrical stimulation performed:yes bil lumbar multifid and bil gluteals Parameters:10 min 1.5 ma Treatment response/outcome: improved soft tissue mobility   DATE: 8/14 4 inch forward step ups 8x right/left 4 inch lateral step ups 8x right/left 2 inch glute hip hikes 5x right/left Manual therapy: soft tissue mobilization to bil gluteals, bil ITB Trigger Point Dry-Needling  Treatment instructions: Expect mild to moderate muscle soreness. S/S of pneumothorax if dry needled over a lung field, and to seek immediate medical attention should they occur. Patient verbalized understanding of these instructions and education. Patient  Consent Given: Yes Education handout provided: Yes Muscles treated:bil lumbar multifidi, bil gluteals and bil ITB prone Electrical stimulation performed:yes bil lumbar multifid and bil gluteals Parameters:10 min 1.5 ma Treatment response/outcome: improved soft tissue mobility    PATIENT EDUCATION:  Education details: DN info per patient instructions Person educated: Patient Education method: Explanation Education comprehension: verbalized understanding  HOME EXERCISE PROGRAM:  Access Code: [email or web address] URL: [email or web address] Date: 08/05/2022 Prepared by: Lavinia Sharps  Exercises - Clamshell  - 1 x daily - 7 x weekly - 1 sets - 10 reps - Seated Hip Abduction with Resistance  - 1 x daily - 7 x weekly - 1 sets - 10 reps - Hip Flexor Stretch on Step  - 1 x daily - 7 x weekly - 1 sets - 10 reps - Standing Hip Extension with Leg Bent and Support  - 1 x daily - 7 x weekly - 1 sets - 10 reps  ASSESSMENT:  CLINICAL IMPRESSION: Several tender points particularly in left ITB but overall positive response to DN combined with ES.  Due to symptom irritability with repetitive exercise and stretching, recommend low reps with moderate load instead.  Therapist monitoring response to all interventions and modifying treatment accordingly.     OBJECTIVE IMPAIRMENTS: decreased activity tolerance, decreased strength, increased fascial restrictions, impaired perceived functional ability, increased muscle spasms, and pain.   ACTIVITY LIMITATIONS: lifting, bending, sitting, squatting, sleeping, stairs, dressing, hygiene/grooming, and locomotion level  PARTICIPATION LIMITATIONS: meal prep, cleaning, laundry, driving, shopping, community activity, and occupation  PERSONAL FACTORS: Past/current experiences, Time since onset of injury/illness/exacerbation, and 3+ comorbidities: multi joint OA, peripheral neuropathy in both feet, chronic back pain  are also affecting patient's functional  outcome.   REHAB POTENTIAL: Good  CLINICAL DECISION MAKING: moderate EVALUATION COMPLEXITY: moderate   GOALS: Goals reviewed with patient? Yes  SHORT TERM GOALS: Target date: 08/19/2022   The patient will demonstrate knowledge of basic self care strategies and exercises to promote healing   Baseline: Goal status: met 5/29  2.  The patient will report a 30% improvement in pain levels with functional activities which are currently difficult including bending, sidelying, sitting in the car Baseline:  Goal status: partially met 20%  3.  The patient will have improved hip strength to at least 4-/5 needed for standing, walking longer distances and descending stairs at home and in the  community  Baseline:  Goal status: met 6/11  4.  Improved LE strength with 5x sit to stand time improved to <13 sec Baseline:  Goal status: met 6/11  LONG TERM GOALS: Target date: 01/14/2023    The patient will be independent in a safe self progression of a home exercise program to promote further recovery of function   Baseline:  Goal status: ongoing  2.  The patient will report a 60% improvement in pain levels with functional activities which are currently difficult including bending, sidelying,sitting in the car Baseline:   6/28: 40-50% better Met 8/9:    car 100 %: better sidelying 80% better;   Goal status: ongoing 3.  The patient will have improved hip strength to at least 4/5 needed for standing, walking longer distances and descending stairs at home and in the community  Baseline:  Goal status: ongoing    4.  The patient will have improved FOTO score to   68%     indicating improved function with less pain  Baseline:  Goal status: met 6/28  5. Able to walk >2 miles with pain level <5/10 Met 8/9  1/10  6.  The patient will have improved trunk flexor and extensor muscle strength to at least 4+/5 needed for gardening, picking up limbs, using riding mower,  stooping/bending new        PLAN:  PT FREQUENCY: 1x/week  PT DURATION: 8 weeks  PLANNED INTERVENTIONS: Therapeutic exercises, Therapeutic activity, Neuromuscular re-education, Balance training, Gait training, Patient/Family education, Self Care, Joint mobilization, Aquatic Therapy, Dry Needling, Cryotherapy, Moist heat, Taping, Ultrasound, Ionotophoresis 4mg /ml Dexamethasone, Manual therapy, and Re-evaluation  PLAN FOR NEXT SESSION: KX; DN/ES bil; gluteal and core ex's; squatting/sit to stand; step ups for attic ladder; hip hikes off step  Lavinia Sharps, PT 12/09/22 8:45 AM Phone: [phone] Fax: [phone]

## 2022-12-17 ENCOUNTER — Ambulatory Visit: Payer: Medicare Other | Attending: Physical Medicine and Rehabilitation | Admitting: Physical Therapy

## 2022-12-17 DIAGNOSIS — M5459 Other low back pain: Secondary | ICD-10-CM | POA: Insufficient documentation

## 2022-12-17 DIAGNOSIS — M25551 Pain in right hip: Secondary | ICD-10-CM | POA: Insufficient documentation

## 2022-12-17 DIAGNOSIS — M25552 Pain in left hip: Secondary | ICD-10-CM | POA: Insufficient documentation

## 2022-12-17 DIAGNOSIS — M6281 Muscle weakness (generalized): Secondary | ICD-10-CM | POA: Diagnosis not present

## 2022-12-17 NOTE — Therapy (Signed)
OUTPATIENT PHYSICAL THERAPY LOWER EXTREMITY PROGRESS NOTE   Patient Name: Lorraine [name] MRN: [id number] DOB:06/19/1950, 72 y.o., female Today's Date: 12/17/2022        PT End of Session - 12/17/22 0849     Visit Number 16    Date for PT Re-Evaluation 01/14/23    Authorization Type Medicare    PT Start Time 0846    PT Stop Time 0930    PT Time Calculation (min) 44 min    Activity Tolerance Patient tolerated treatment well               Past Medical History:  Diagnosis Date   Anxiety    Aortic atherosclerosis (HCC) 02/03/2018   Noted on CT Abd/Pelvis   AR (aortic regurgitation) 07/18/2017   Mild, noted on ECHO   Bilateral cataracts    Bilateral plantar fasciitis    Chronic knee pain    Chronic low back pain    Chronic lumbar radiculopathy 07/14/2017   Mild, L5, noted on electromyography   History of palpitations    2014-- low risk exercise tolerence test, no ischemi, PVC's (09-06-2012)   HSV-1 (herpes simplex virus 1) infection    Cold sore   Hx of colonic polyps    Hyperlipidemia    Insomnia    Lactose intolerance    Left nephrolithiasis 02/03/2018   Nonobstructing, noted on CT Abd/pelvis   Morton's metatarsalgia, neuralgia, or neuroma, bilateral    Osteoarthritis    knees and lumbar, feet   Osteopenia    Peripheral neuropathy    bilateral feet -- burning and stinging   PONV (postoperative nausea and vomiting)    severe   Presence of Watchman left atrial appendage closure device 06/11/2021   Watchman 27mm FLX with Dr. Lalla Brothers   PVC's (premature ventricular contractions)    Systolic murmur    "Slight"   Vaginal cyst    Wears glasses    Past Surgical History:  Procedure Laterality Date   ANTERIOR CERVICAL DECOMP/DISCECTOMY FUSION  01/2014   C4 -- C6   ATRIAL FIBRILLATION ABLATION N/A 04/09/2021   Procedure: ATRIAL FIBRILLATION ABLATION;  Surgeon: Lanier Prude, MD;  Location: [facility];  Service: Cardiovascular;  Laterality: N/A;    BREAST BIOPSY Left 07/17/2018   x2   CATARACT EXTRACTION W/ INTRAOCULAR LENS  IMPLANT, BILATERAL  2015   COLONOSCOPY  05/14/2015   CYSTO WITH HYDRODISTENSION N/A 04/14/2018   Procedure: CYSTOSCOPY/HYDRODISTENSION WITH INSTILLATION OF PYRIDIUM AND MARCAINE, BLADDER BIOPSY WITH FULGERATION 0.5 TO 2 CM;  Surgeon: Jerilee Field, MD;  Location: [facility];  Service: Urology;  Laterality: N/A;   ELBOW DEBRIDEMENT Right 09/10/2010   and tendon debridement and radial tunnel release   EXCISION MORTON'S NEUROMA Bilateral left 02-20-2009/  right & left 05-08-2009   EXCISION VAGINAL CYST N/A 07/01/2016   Procedure: EXCISION VAGINAL CYST;  Surgeon: Ranae Pila, MD;  Location: [facility];  Service: Gynecology;  Laterality: N/A;   LEFT ATRIAL APPENDAGE OCCLUSION N/A 06/11/2021   Procedure: LEFT ATRIAL APPENDAGE OCCLUSION;  Surgeon: Lanier Prude, MD;  Location: [facility];  Service: Cardiovascular;  Laterality: N/A;   PLANTAR FASCIA RELEASE Bilateral left 04-02-2010/  right 04-02-2016   TEE WITHOUT CARDIOVERSION N/A 06/11/2021   Procedure: TRANSESOPHAGEAL ECHOCARDIOGRAM (TEE);  Surgeon: Lanier Prude, MD;  Location: [facility];  Service: Cardiovascular;  Laterality: N/A;   TONSILLECTOMY AND ADENOIDECTOMY  child   TOTAL ABDOMINAL HYSTERECTOMY  1974  w/  Left salpingoophorectomy and Partial right salpingoophorectomy   Patient Active Problem List   Diagnosis Date Noted   Obesity (BMI 30-39.9) 12/08/2022   Chronic bilateral low back pain without sciatica 12/08/2022   Presence of Watchman left atrial appendage closure device 06/12/2021   Atrial fibrillation (HCC) 06/11/2021   Prolapse of female genital organs 07/09/2020   Candidiasis 12/19/2019   Chronic pain of both shoulders 11/28/2019   Malignant neoplasm of lower-inner quadrant of left breast in female, estrogen receptor positive (HCC) 07/26/2018   Anxiety about health 05/31/2018    Paroxysmal atrial fibrillation (HCC) 05/18/2018   Systolic murmur 07/14/2017   Shingles 11/23/2016   Arthritis 05/26/2016   Insomnia 05/26/2016   Neuropathy 05/26/2016   Positive ANA (antinuclear antibody) 05/26/2016   Hyperlipidemia 05/26/2016   Neck and shoulder pain 01/26/2014   Cervicalgia 01/26/2014   Enthesopathy of ankle and tarsus 05/06/2010   PLANTAR FASCIITIS, LEFT 05/06/2010   FOOT PAIN, BILATERAL 05/06/2010    PCP: Neena Rhymes MD  REFERRING PROVIDER: Romero Belling MD  REFERRING DIAG: bil glute medius tendonopathy, history of glute tear and right ischial bursa pain  THERAPY DIAG:  Hip pain; weakness Rationale for Evaluation and Treatment: Rehabilitation  ONSET DATE: 03/15/22  SUBJECTIVE:   SUBJECTIVE STATEMENT: My lower back was hurting really bad in the early part of the week so I took a prednisone for 3 days which helped.  Last night right anterior thigh pain started and is affecting my walk.    New MRI:   "progressive lumbar disc and facet degeneration.  Severe spinal stenosis and mild to moderate right neural foraminal stenosis at L4-5, mild to moderate left lateral recess and left neural foraminal stenosis L1-2"  PERTINENT HISTORY: Several injections in bursa on left over the years, fewer injections on right.  Just finished a Round of prednisone for 6 days helped but the left hip pain has returned.  Tore right glute from yardwork and fell twice years ago and took months to heal.  1 month ago got on recumbent bike at work for 27 min and strained right glute again (ischial area); I don't feel as stable   Does chair yoga; does water exercise on her own at [employer] Walking on the treadmill Resolved A-fib; newly referred to pain clinic for spinal cord stimulator to address peripheral neuropathy in feet;  Chronic lumbar radiculopathy; peripheral neuropathy in feet; cervical fusion Bil knee OA Lots of PT over the year "I didn't get anything out of it"   PAIN:  PAIN:  Are you having pain? Yes NPRS scale: 2-3/10 left hip > right hip and ITB; bil LBP Pain location: right ischial tuberosity; left lateral hip trochanteric bursa Aggravating factors: bending down;  sudden movement; sidelying; sitting in the car Relieving factors: walking does not ; movement PRECAUTIONS: None  WEIGHT BEARING RESTRICTIONS: No  FALLS:  Has patient fallen in last 6 months? No   OCCUPATION: working 12 hour shifts  PLOF: Independent  PATIENT GOALS: I told the doctor I didn't need to come but I wanted to do the exercises properly and try the DN (mostly here for the DN)   OBJECTIVE:     PATIENT SURVEYS:  FOTO 65% 6/28  69% goal met taken out of FOTO  COGNITION: Overall cognitive status: Within functional limits for tasks assessed      MUSCLE LENGTH: Hamstrings: Right 70 deg; Left 75 deg Decreased hip flexor length: extension to 5 degrees bil  POSTURE: pelvic drop with attempted SLS <  3 sec right/left 8/9:  immediate left pelvic drop but able to SLS 8 sec; minimal drop on right SLS 8 sec  PALPATION: Tender points left lateral glutes; tender points right proximal HS at ischial tuberosity; tender bil ITB  LOWER EXTREMITY ROM: Bil knee flexion 125 degrees  LUMBAR ROM;    LOWER EXTREMITY MMT:  MMT Right eval Left eval 6/28 8/9 9/6  Hip flexion 4 4 4  4+ 4+  Hip extension 4- 4- 4 4 4+  Hip abduction 3- 3- 4- 4 4  Hip adduction       Hip internal rotation       Hip external rotation 4- 4- 4 4 4+  Knee flexion 4- 4 4 4+ 4+  Knee extension 4 4 4  4+ 4+  Ankle dorsiflexion       Ankle plantarflexion       Ankle inversion       Ankle eversion        (Blank rows = not tested)  6/28; TRUNK STRENGTH:  Decreased activation of transverse abdominus muscles; abdominals 4-/5; decreased activation of lumbar multifidi; trunk extensors 4-/5  8/9:  abdominal and trunk extensors grossly 4/5  FUNCTIONAL TESTS:  5x sit to stand no hands 14.02  sec 6/11:  9.51 sec 8/9: 9.45 normal range  GAIT:  Comments: pelvic drop, lateral trunk lean   TODAY'S TREATMENT:  DATE: 12/17/22 Manual therapy: soft tissue mobilization to bil gluteals, bil ITB, right quads Trigger Point Dry-Needling  Treatment instructions: Expect mild to moderate muscle soreness. S/S of pneumothorax if dry needled over a lung field, and to seek immediate medical attention should they occur. Patient verbalized understanding of these instructions and education. Patient Consent Given: Yes Education handout provided: Yes Muscles treated:sidelying right quads and gluteals; bil lumbar multifidi, left gluteals and left ITB prone Electrical stimulation performed:yes right quads 5 min; bil lumbar multifidi 5 min Parameters: 1.5 ma Treatment response/outcome: improved soft tissue mobility    DATE: 12/09/2022 Discussion of current mobility and exercise Discussed trial of strengthening at the gym: max of 10 reps at moderate intensity 3 LE ex alternating with 3 UE exs Manual therapy: soft tissue mobilization to bil gluteals, bil ITB Trigger Point Dry-Needling  Treatment instructions: Expect mild to moderate muscle soreness. S/S of pneumothorax if dry needled over a lung field, and to seek immediate medical attention should they occur. Patient verbalized understanding of these instructions and education. Patient Consent Given: Yes Education handout provided: Yes Muscles treated:bil lumbar multifidi, bil gluteals and bil ITB prone Electrical stimulation performed:yes bil lumbar multifid; bil gluteals Parameters:10 min 1.5 ma Treatment response/outcome: improved soft tissue mobility   DATE: 11/25/2022 Discussion of recent flare up  Discussion of current mobility and exercise Manual therapy: soft tissue mobilization to bil gluteals, bil ITB Trigger Point Dry-Needling  Treatment instructions: Expect mild to moderate muscle soreness. S/S of pneumothorax if dry needled over a lung  field, and to seek immediate medical attention should they occur. Patient verbalized understanding of these instructions and education. Patient Consent Given: Yes Education handout provided: Yes Muscles treated:bil lumbar multifidi, bil gluteals and bil ITB prone Electrical stimulation performed:yes left lumbar multifid; left gluteals; left ITB Parameters:10 min 1.5 ma Treatment response/outcome: improved soft tissue mobility    DATE: 11/19/2022 Discussion of recent flare up and discussion of how to lower intensity of ex in the pool: dec speed of movement, fewer reps, shorter duration Review of overall progress with PT Manual therapy: soft tissue mobilization to bil gluteals,  bil ITB Trigger Point Dry-Needling  Treatment instructions: Expect mild to moderate muscle soreness. S/S of pneumothorax if dry needled over a lung field, and to seek immediate medical attention should they occur. Patient verbalized understanding of these instructions and education. Patient Consent Given: Yes Education handout provided: Yes Muscles treated:bil lumbar multifidi, bil gluteals and bil ITB prone Electrical stimulation performed:yes left lumbar multifid; left gluteals; left ITB Parameters:10 min 1.5 ma Treatment response/outcome: improved soft tissue mobility   DATE: 8/20 Discussion of return to ex machines this weekend and dosage.   Leg press set up Discussion of knee extension machines (bothers knee) 6 inch forward step ups 5x right/left 6 inch lateral step ups 5x right/left 6 inch glute hip hikes 5x right/left Manual therapy: soft tissue mobilization to bil gluteals, bil ITB Trigger Point Dry-Needling  Treatment instructions: Expect mild to moderate muscle soreness. S/S of pneumothorax if dry needled over a lung field, and to seek immediate medical attention should they occur. Patient verbalized understanding of these instructions and education. Patient Consent Given: Yes Education handout provided:  Yes Muscles treated:bil lumbar multifidi, bil gluteals and bil ITB prone Electrical stimulation performed:yes bil lumbar multifid and bil gluteals Parameters:10 min 1.5 ma Treatment response/outcome: improved soft tissue mobility    PATIENT EDUCATION:  Education details: DN info per patient instructions Person educated: Patient Education method: Explanation Education comprehension: verbalized understanding  HOME EXERCISE PROGRAM:  Access Code: [email or web address] URL: [email or web address] Date: 08/05/2022 Prepared by: Lavinia Sharps  Exercises - Clamshell  - 1 x daily - 7 x weekly - 1 sets - 10 reps - Seated Hip Abduction with Resistance  - 1 x daily - 7 x weekly - 1 sets - 10 reps - Hip Flexor Stretch on Step  - 1 x daily - 7 x weekly - 1 sets - 10 reps - Standing Hip Extension with Leg Bent and Support  - 1 x daily - 7 x weekly - 1 sets - 10 reps  ASSESSMENT:  CLINICAL IMPRESSION: The patient reports increased pain on the right side including new onset of right anterior thigh pain affecting her walk starting yesterday (no known cause).  She is highly sensitive with DN on the right today especially gluteals even when combined with ES to help with pain modulation.  Overall she reports treatment has been helpful in managing her pain and allowing her to continue with home and work functional activities.  Therapist continually adjusting treatment based on symptom response.     OBJECTIVE IMPAIRMENTS: decreased activity tolerance, decreased strength, increased fascial restrictions, impaired perceived functional ability, increased muscle spasms, and pain.   ACTIVITY LIMITATIONS: lifting, bending, sitting, squatting, sleeping, stairs, dressing, hygiene/grooming, and locomotion level  PARTICIPATION LIMITATIONS: meal prep, cleaning, laundry, driving, shopping, community activity, and occupation  PERSONAL FACTORS: Past/current experiences, Time since onset of  injury/illness/exacerbation, and 3+ comorbidities: multi joint OA, peripheral neuropathy in both feet, chronic back pain  are also affecting patient's functional outcome.   REHAB POTENTIAL: Good  CLINICAL DECISION MAKING: moderate EVALUATION COMPLEXITY: moderate   GOALS: Goals reviewed with patient? Yes  SHORT TERM GOALS: Target date: 08/19/2022   The patient will demonstrate knowledge of basic self care strategies and exercises to promote healing   Baseline: Goal status: met 5/29  2.  The patient will report a 30% improvement in pain levels with functional activities which are currently difficult including bending, sidelying, sitting in the car Baseline:  Goal status: partially met 20%  3.  The patient will have improved  hip strength to at least 4-/5 needed for standing, walking longer distances and descending stairs at home and in the community  Baseline:  Goal status: met 6/11  4.  Improved LE strength with 5x sit to stand time improved to <13 sec Baseline:  Goal status: met 6/11  LONG TERM GOALS: Target date: 01/14/2023    The patient will be independent in a safe self progression of a home exercise program to promote further recovery of function   Baseline:  Goal status: ongoing  2.  The patient will report a 60% improvement in pain levels with functional activities which are currently difficult including bending, sidelying,sitting in the car Baseline:   6/28: 40-50% better Met 8/9:    car 100 %: better sidelying 80% better;   Goal status: ongoing 3.  The patient will have improved hip strength to at least 4/5 needed for standing, walking longer distances and descending stairs at home and in the community  Baseline:  Goal status: ongoing    4.  The patient will have improved FOTO score to   68%     indicating improved function with less pain  Baseline:  Goal status: met 6/28  5. Able to walk >2 miles with pain level <5/10 Met 8/9  1/10  6.  The patient will  have improved trunk flexor and extensor muscle strength to at least 4+/5 needed for gardening, picking up limbs, using riding mower, stooping/bending new        PLAN:  PT FREQUENCY: 1x/week  PT DURATION: 8 weeks  PLANNED INTERVENTIONS: Therapeutic exercises, Therapeutic activity, Neuromuscular re-education, Balance training, Gait training, Patient/Family education, Self Care, Joint mobilization, Aquatic Therapy, Dry Needling, Cryotherapy, Moist heat, Taping, Ultrasound, Ionotophoresis 4mg /ml Dexamethasone, Manual therapy, and Re-evaluation  PLAN FOR NEXT SESSION: KX; DN/ES bil; gluteal and core ex's; squatting/sit to stand; step ups for attic ladder; hip hikes off step  Lavinia Sharps, PT 12/17/22 10:14 AM Phone: [phone] Fax: [phone]

## 2022-12-22 ENCOUNTER — Ambulatory Visit: Payer: Medicare Other | Admitting: Physical Therapy

## 2022-12-22 DIAGNOSIS — M25552 Pain in left hip: Secondary | ICD-10-CM | POA: Diagnosis not present

## 2022-12-22 DIAGNOSIS — M5459 Other low back pain: Secondary | ICD-10-CM | POA: Diagnosis not present

## 2022-12-22 DIAGNOSIS — M6281 Muscle weakness (generalized): Secondary | ICD-10-CM | POA: Diagnosis not present

## 2022-12-22 DIAGNOSIS — M25551 Pain in right hip: Secondary | ICD-10-CM | POA: Diagnosis not present

## 2022-12-22 NOTE — Therapy (Addendum)
OUTPATIENT PHYSICAL THERAPY LOWER EXTREMITY PROGRESS NOTE/DISCHARGE SUMMARY   Patient Name: Lorraine [name] MRN: [id number] DOB:May 19, 1950, 72 y.o., female Today's Date: 12/22/2022        PT End of Session - 12/22/22 0850     Visit Number 17    Date for PT Re-Evaluation 01/14/23    Authorization Type Medicare    PT Start Time 0846    PT Stop Time 0930    PT Time Calculation (min) 44 min    Activity Tolerance Patient tolerated treatment well               Past Medical History:  Diagnosis Date   Anxiety    Aortic atherosclerosis (HCC) 02/03/2018   Noted on CT Abd/Pelvis   AR (aortic regurgitation) 07/18/2017   Mild, noted on ECHO   Bilateral cataracts    Bilateral plantar fasciitis    Chronic knee pain    Chronic low back pain    Chronic lumbar radiculopathy 07/14/2017   Mild, L5, noted on electromyography   History of palpitations    2014-- low risk exercise tolerence test, no ischemi, PVC's (09-06-2012)   HSV-1 (herpes simplex virus 1) infection    Cold sore   Hx of colonic polyps    Hyperlipidemia    Insomnia    Lactose intolerance    Left nephrolithiasis 02/03/2018   Nonobstructing, noted on CT Abd/pelvis   Morton's metatarsalgia, neuralgia, or neuroma, bilateral    Osteoarthritis    knees and lumbar, feet   Osteopenia    Peripheral neuropathy    bilateral feet -- burning and stinging   PONV (postoperative nausea and vomiting)    severe   Presence of Watchman left atrial appendage closure device 06/11/2021   Watchman 27mm FLX with Dr. Lalla Brothers   PVC's (premature ventricular contractions)    Systolic murmur    "Slight"   Vaginal cyst    Wears glasses    Past Surgical History:  Procedure Laterality Date   ANTERIOR CERVICAL DECOMP/DISCECTOMY FUSION  01/2014   C4 -- C6   ATRIAL FIBRILLATION ABLATION N/A 04/09/2021   Procedure: ATRIAL FIBRILLATION ABLATION;  Surgeon: Lanier Prude, MD;  Location: [facility];  Service: Cardiovascular;   Laterality: N/A;   BREAST BIOPSY Left 07/17/2018   x2   CATARACT EXTRACTION W/ INTRAOCULAR LENS  IMPLANT, BILATERAL  2015   COLONOSCOPY  05/14/2015   CYSTO WITH HYDRODISTENSION N/A 04/14/2018   Procedure: CYSTOSCOPY/HYDRODISTENSION WITH INSTILLATION OF PYRIDIUM AND MARCAINE, BLADDER BIOPSY WITH FULGERATION 0.5 TO 2 CM;  Surgeon: Jerilee Field, MD;  Location: [facility];  Service: Urology;  Laterality: N/A;   ELBOW DEBRIDEMENT Right 09/10/2010   and tendon debridement and radial tunnel release   EXCISION MORTON'S NEUROMA Bilateral left 02-20-2009/  right & left 05-08-2009   EXCISION VAGINAL CYST N/A 07/01/2016   Procedure: EXCISION VAGINAL CYST;  Surgeon: Ranae Pila, MD;  Location: [facility];  Service: Gynecology;  Laterality: N/A;   LEFT ATRIAL APPENDAGE OCCLUSION N/A 06/11/2021   Procedure: LEFT ATRIAL APPENDAGE OCCLUSION;  Surgeon: Lanier Prude, MD;  Location: [facility];  Service: Cardiovascular;  Laterality: N/A;   PLANTAR FASCIA RELEASE Bilateral left 04-02-2010/  right 04-02-2016   TEE WITHOUT CARDIOVERSION N/A 06/11/2021   Procedure: TRANSESOPHAGEAL ECHOCARDIOGRAM (TEE);  Surgeon: Lanier Prude, MD;  Location: [facility];  Service: Cardiovascular;  Laterality: N/A;   TONSILLECTOMY AND ADENOIDECTOMY  child   TOTAL ABDOMINAL HYSTERECTOMY  1974  w/  Left salpingoophorectomy and Partial right salpingoophorectomy   Patient Active Problem List   Diagnosis Date Noted   Obesity (BMI 30-39.9) 12/08/2022   Chronic bilateral low back pain without sciatica 12/08/2022   Presence of Watchman left atrial appendage closure device 06/12/2021   Atrial fibrillation (HCC) 06/11/2021   Prolapse of female genital organs 07/09/2020   Candidiasis 12/19/2019   Chronic pain of both shoulders 11/28/2019   Malignant neoplasm of lower-inner quadrant of left breast in female, estrogen receptor positive (HCC) 07/26/2018   Anxiety about  health 05/31/2018   Paroxysmal atrial fibrillation (HCC) 05/18/2018   Systolic murmur 07/14/2017   Shingles 11/23/2016   Arthritis 05/26/2016   Insomnia 05/26/2016   Neuropathy 05/26/2016   Positive ANA (antinuclear antibody) 05/26/2016   Hyperlipidemia 05/26/2016   Neck and shoulder pain 01/26/2014   Cervicalgia 01/26/2014   Enthesopathy of ankle and tarsus 05/06/2010   PLANTAR FASCIITIS, LEFT 05/06/2010   FOOT PAIN, BILATERAL 05/06/2010    PCP: Neena Rhymes MD  REFERRING PROVIDER: Romero Belling MD  REFERRING DIAG: bil glute medius tendonopathy, history of glute tear and right ischial bursa pain  THERAPY DIAG:  Hip pain; weakness Rationale for Evaluation and Treatment: Rehabilitation  ONSET DATE: 03/15/22  SUBJECTIVE:   SUBJECTIVE STATEMENT: Last time was rough but the next day I felt better.  It wasn't a pleasant experience but I think the DN helped. I'm going to do some yard work today.     New MRI:   "progressive lumbar disc and facet degeneration.  Severe spinal stenosis and mild to moderate right neural foraminal stenosis at L4-5, mild to moderate left lateral recess and left neural foraminal stenosis L1-2"  PERTINENT HISTORY: Several injections in bursa on left over the years, fewer injections on right.  Just finished a Round of prednisone for 6 days helped but the left hip pain has returned.  Tore right glute from yardwork and fell twice years ago and took months to heal.  1 month ago got on recumbent bike at work for 27 min and strained right glute again (ischial area); I don't feel as stable   Does chair yoga; does water exercise on her own at [employer] Walking on the treadmill Resolved A-fib; newly referred to pain clinic for spinal cord stimulator to address peripheral neuropathy in feet;  Chronic lumbar radiculopathy; peripheral neuropathy in feet; cervical fusion Bil knee OA Lots of PT over the year "I didn't get anything out of it"  PAIN:  PAIN:  Are  you having pain? Yes NPRS scale: 2/10  right hip and lateral thigh  and bil ITB; bil LBP Pain location: right ischial tuberosity; left lateral hip trochanteric bursa Aggravating factors: bending down;  sudden movement; sidelying; sitting in the car Relieving factors: walking does not ; movement PRECAUTIONS: None  WEIGHT BEARING RESTRICTIONS: No  FALLS:  Has patient fallen in last 6 months? No   OCCUPATION: working 12 hour shifts  PLOF: Independent  PATIENT GOALS: I told the doctor I didn't need to come but I wanted to do the exercises properly and try the DN (mostly here for the DN)   OBJECTIVE:     PATIENT SURVEYS:  FOTO 65% 6/28  69% goal met taken out of FOTO  COGNITION: Overall cognitive status: Within functional limits for tasks assessed      MUSCLE LENGTH: Hamstrings: Right 70 deg; Left 75 deg Decreased hip flexor length: extension to 5 degrees bil  POSTURE: pelvic drop with attempted SLS <3 sec  right/left 8/9:  immediate left pelvic drop but able to SLS 8 sec; minimal drop on right SLS 8 sec  PALPATION: Tender points left lateral glutes; tender points right proximal HS at ischial tuberosity; tender bil ITB  LOWER EXTREMITY ROM: Bil knee flexion 125 degrees  LUMBAR ROM;    LOWER EXTREMITY MMT:  MMT Right eval Left eval 6/28 8/9 9/6  Hip flexion 4 4 4  4+ 4+  Hip extension 4- 4- 4 4 4+  Hip abduction 3- 3- 4- 4 4  Hip adduction       Hip internal rotation       Hip external rotation 4- 4- 4 4 4+  Knee flexion 4- 4 4 4+ 4+  Knee extension 4 4 4  4+ 4+  Ankle dorsiflexion       Ankle plantarflexion       Ankle inversion       Ankle eversion        (Blank rows = not tested)  6/28; TRUNK STRENGTH:  Decreased activation of transverse abdominus muscles; abdominals 4-/5; decreased activation of lumbar multifidi; trunk extensors 4-/5  8/9:  abdominal and trunk extensors grossly 4/5  FUNCTIONAL TESTS:  5x sit to stand no hands 14.02 sec 6/11:  9.51  sec 8/9: 9.45 normal range  GAIT:  Comments: pelvic drop, lateral trunk lean   TODAY'S TREATMENT:  DATE: 12/22/22 Discussion of status and response to treatment Manual therapy: soft tissue mobilization to bil lumbar paraspinals; bil gluteals, bil ITB, right quads Trigger Point Dry-Needling  Treatment instructions: Expect mild to moderate muscle soreness. S/S of pneumothorax if dry needled over a lung field, and to seek immediate medical attention should they occur. Patient verbalized understanding of these instructions and education. Patient Consent Given: Yes Education handout provided: Yes Muscles treated:sidelying right quads and gluteals; bil lumbar multifidi, left gluteals and left ITB prone Electrical stimulation performed:yes right quads 10 min sidelying Parameters: 1.5 ma Treatment response/outcome: improved soft tissue mobility    DATE: 12/17/22 Manual therapy: soft tissue mobilization to bil gluteals, bil ITB, right quads Trigger Point Dry-Needling  Treatment instructions: Expect mild to moderate muscle soreness. S/S of pneumothorax if dry needled over a lung field, and to seek immediate medical attention should they occur. Patient verbalized understanding of these instructions and education. Patient Consent Given: Yes Education handout provided: Yes Muscles treated:sidelying right quads and gluteals; bil lumbar multifidi, left gluteals and left ITB prone Electrical stimulation performed:yes right quads 5 min; bil lumbar multifidi 5 min Parameters: 1.5 ma Treatment response/outcome: improved soft tissue mobility    DATE: 12/09/2022 Discussion of current mobility and exercise Discussed trial of strengthening at the gym: max of 10 reps at moderate intensity 3 LE ex alternating with 3 UE exs Manual therapy: soft tissue mobilization to bil gluteals, bil ITB Trigger Point Dry-Needling  Treatment instructions: Expect mild to moderate muscle soreness. S/S of pneumothorax if dry  needled over a lung field, and to seek immediate medical attention should they occur. Patient verbalized understanding of these instructions and education. Patient Consent Given: Yes Education handout provided: Yes Muscles treated:bil lumbar multifidi, bil gluteals and bil ITB prone Electrical stimulation performed:yes bil lumbar multifid; bil gluteals Parameters:10 min 1.5 ma Treatment response/outcome: improved soft tissue mobility   DATE: 11/25/2022 Discussion of recent flare up  Discussion of current mobility and exercise Manual therapy: soft tissue mobilization to bil gluteals, bil ITB Trigger Point Dry-Needling  Treatment instructions: Expect mild to moderate muscle soreness. S/S of pneumothorax if dry  needled over a lung field, and to seek immediate medical attention should they occur. Patient verbalized understanding of these instructions and education. Patient Consent Given: Yes Education handout provided: Yes Muscles treated:bil lumbar multifidi, bil gluteals and bil ITB prone Electrical stimulation performed:yes left lumbar multifid; left gluteals; left ITB Parameters:10 min 1.5 ma Treatment response/outcome: improved soft tissue mobility    PATIENT EDUCATION:  Education details: DN info per patient instructions Person educated: Patient Education method: Explanation Education comprehension: verbalized understanding  HOME EXERCISE PROGRAM:  Access Code: [email or web address] URL: [email or web address] Date: 08/05/2022 Prepared by: Lavinia Sharps  Exercises - Clamshell  - 1 x daily - 7 x weekly - 1 sets - 10 reps - Seated Hip Abduction with Resistance  - 1 x daily - 7 x weekly - 1 sets - 10 reps - Hip Flexor Stretch on Step  - 1 x daily - 7 x weekly - 1 sets - 10 reps - Standing Hip Extension with Leg Bent and Support  - 1 x daily - 7 x weekly - 1 sets - 10 reps  ASSESSMENT:  CLINICAL IMPRESSION: Highly sensitive in right anterior quads since most recent exacerbation  of symptoms.  Although DN, even with ES, is very painful, she feels the after-effect is pain relief.  Decreased overall tender points noted in gluteals.  Therapist monitoring response to all interventions and modifying treatment accordingly.    OBJECTIVE IMPAIRMENTS: decreased activity tolerance, decreased strength, increased fascial restrictions, impaired perceived functional ability, increased muscle spasms, and pain.   ACTIVITY LIMITATIONS: lifting, bending, sitting, squatting, sleeping, stairs, dressing, hygiene/grooming, and locomotion level  PARTICIPATION LIMITATIONS: meal prep, cleaning, laundry, driving, shopping, community activity, and occupation  PERSONAL FACTORS: Past/current experiences, Time since onset of injury/illness/exacerbation, and 3+ comorbidities: multi joint OA, peripheral neuropathy in both feet, chronic back pain  are also affecting patient's functional outcome.   REHAB POTENTIAL: Good  CLINICAL DECISION MAKING: moderate EVALUATION COMPLEXITY: moderate   GOALS: Goals reviewed with patient? Yes  SHORT TERM GOALS: Target date: 08/19/2022   The patient will demonstrate knowledge of basic self care strategies and exercises to promote healing   Baseline: Goal status: met 5/29  2.  The patient will report a 30% improvement in pain levels with functional activities which are currently difficult including bending, sidelying, sitting in the car Baseline:  Goal status: partially met 20%  3.  The patient will have improved hip strength to at least 4-/5 needed for standing, walking longer distances and descending stairs at home and in the community  Baseline:  Goal status: met 6/11  4.  Improved LE strength with 5x sit to stand time improved to <13 sec Baseline:  Goal status: met 6/11  LONG TERM GOALS: Target date: 01/14/2023    The patient will be independent in a safe self progression of a home exercise program to promote further recovery of function    Baseline:  Goal status: ongoing  2.  The patient will report a 60% improvement in pain levels with functional activities which are currently difficult including bending, sidelying,sitting in the car Baseline:   6/28: 40-50% better Met 8/9:    car 100 %: better sidelying 80% better;   Goal status: ongoing 3.  The patient will have improved hip strength to at least 4/5 needed for standing, walking longer distances and descending stairs at home and in the community  Baseline:  Goal status: ongoing    4.  The patient will have improved FOTO score to   68%  indicating improved function with less pain  Baseline:  Goal status: met 6/28  5. Able to walk >2 miles with pain level <5/10 Met 8/9  1/10  6.  The patient will have improved trunk flexor and extensor muscle strength to at least 4+/5 needed for gardening, picking up limbs, using riding mower, stooping/bending new        PLAN:  PT FREQUENCY: 1x/week  PT DURATION: 8 weeks  PLANNED INTERVENTIONS: Therapeutic exercises, Therapeutic activity, Neuromuscular re-education, Balance training, Gait training, Patient/Family education, Self Care, Joint mobilization, Aquatic Therapy, Dry Needling, Cryotherapy, Moist heat, Taping, Ultrasound, Ionotophoresis 4mg /ml Dexamethasone, Manual therapy, and Re-evaluation  PLAN FOR NEXT SESSION: KX; DN/ES bil; try supine position for DN quads instead of sidelying; gluteal and core ex's; squatting/sit to stand; step ups for attic ladder; hip hikes off step  Lavinia Sharps, PT 12/22/22 1:45 PM Phone: [phone] Fax: [phone]  PHYSICAL THERAPY DISCHARGE SUMMARY  Visits from Start of Care: 17  Current functional level related to goals / functional outcomes: The patient called to cancel her appt on 11/1 and expressed readiness for discharge from PT.    Remaining deficits: As above   Education / Equipment: HEP   Patient agrees to discharge. Patient goals were partially met.  Patient is being discharged due to the patient's request.  Lavinia Sharps, PT 01/06/23 12:38 PM Phone: [phone] Fax: [phone]

## 2022-12-23 DIAGNOSIS — H40013 Open angle with borderline findings, low risk, bilateral: Secondary | ICD-10-CM | POA: Diagnosis not present

## 2022-12-23 DIAGNOSIS — H35363 Drusen (degenerative) of macula, bilateral: Secondary | ICD-10-CM | POA: Diagnosis not present

## 2022-12-25 ENCOUNTER — Other Ambulatory Visit: Payer: Self-pay | Admitting: Podiatry

## 2022-12-27 DIAGNOSIS — M7062 Trochanteric bursitis, left hip: Secondary | ICD-10-CM | POA: Diagnosis not present

## 2022-12-27 DIAGNOSIS — M5416 Radiculopathy, lumbar region: Secondary | ICD-10-CM | POA: Diagnosis not present

## 2022-12-28 ENCOUNTER — Ambulatory Visit: Payer: Medicare Other | Admitting: Physical Therapy

## 2023-01-05 ENCOUNTER — Encounter: Payer: Medicare Other | Admitting: Physical Therapy

## 2023-01-05 DIAGNOSIS — M5416 Radiculopathy, lumbar region: Secondary | ICD-10-CM | POA: Diagnosis not present

## 2023-01-14 ENCOUNTER — Encounter: Payer: Medicare Other | Admitting: Physical Therapy

## 2023-01-16 ENCOUNTER — Encounter: Payer: Self-pay | Admitting: Family Medicine

## 2023-01-17 MED ORDER — ALPRAZOLAM 0.5 MG PO TABS: 0.5000 mg | ORAL_TABLET | Freq: Two times a day (BID) | ORAL | 1 refills | Status: DC | PRN

## 2023-01-25 ENCOUNTER — Other Ambulatory Visit: Payer: Self-pay | Admitting: Podiatry

## 2023-02-02 DIAGNOSIS — H35363 Drusen (degenerative) of macula, bilateral: Secondary | ICD-10-CM | POA: Diagnosis not present

## 2023-02-08 DIAGNOSIS — M47816 Spondylosis without myelopathy or radiculopathy, lumbar region: Secondary | ICD-10-CM | POA: Diagnosis not present

## 2023-02-15 ENCOUNTER — Encounter: Payer: Self-pay | Admitting: Family Medicine

## 2023-02-16 ENCOUNTER — Telehealth: Payer: Self-pay

## 2023-02-16 MED ORDER — ZOLPIDEM TARTRATE 10 MG PO TABS: 10.0000 mg | ORAL_TABLET | Freq: Every day | ORAL | 3 refills | Status: DC

## 2023-02-16 NOTE — Telephone Encounter (Signed)
Sent to PCP ?

## 2023-02-16 NOTE — Addendum Note (Signed)
Addended by: Sheliah Hatch on: 02/16/2023 04:13 PM   Modules accepted: Orders

## 2023-02-16 NOTE — Telephone Encounter (Signed)
Prescription sent to pharmacy.

## 2023-02-21 DIAGNOSIS — M47816 Spondylosis without myelopathy or radiculopathy, lumbar region: Secondary | ICD-10-CM | POA: Diagnosis not present

## 2023-03-16 ENCOUNTER — Encounter: Payer: Self-pay | Admitting: Family Medicine

## 2023-03-17 ENCOUNTER — Encounter: Payer: Self-pay | Admitting: Hematology and Oncology

## 2023-03-17 MED ORDER — ALPRAZOLAM 0.5 MG PO TABS: 0.5000 mg | ORAL_TABLET | Freq: Two times a day (BID) | ORAL | 1 refills | Status: DC | PRN

## 2023-03-17 NOTE — Telephone Encounter (Signed)
 Requested Prescriptions   Pending Prescriptions Disp Refills   ALPRAZolam  (XANAX ) 0.5 MG tablet 60 tablet 1    Sig: Take 1 tablet (0.5 mg total) by mouth 2 (two) times daily as needed for anxiety.     Date of patient request: 03/17/2023 Last office visit: 12/08/2022 Upcoming visit: 06/08/2023 Date of last refill: 02/15/2023 Last refill amount: 60

## 2023-03-23 ENCOUNTER — Encounter (HOSPITAL_COMMUNITY): Payer: Self-pay

## 2023-03-23 ENCOUNTER — Other Ambulatory Visit: Payer: Self-pay

## 2023-03-23 ENCOUNTER — Emergency Department (HOSPITAL_COMMUNITY)
Admission: EM | Admit: 2023-03-23 | Discharge: 2023-03-23 | Disposition: A | Discharge status: Home / Self Care | Payer: Medicare Other | Attending: Emergency Medicine | Admitting: Emergency Medicine

## 2023-03-23 ENCOUNTER — Emergency Department (HOSPITAL_COMMUNITY): Payer: Medicare Other

## 2023-03-23 DIAGNOSIS — N200 Calculus of kidney: Secondary | ICD-10-CM | POA: Diagnosis not present

## 2023-03-23 DIAGNOSIS — R197 Diarrhea, unspecified: Secondary | ICD-10-CM | POA: Diagnosis not present

## 2023-03-23 DIAGNOSIS — Z853 Personal history of malignant neoplasm of breast: Secondary | ICD-10-CM | POA: Insufficient documentation

## 2023-03-23 DIAGNOSIS — K529 Noninfective gastroenteritis and colitis, unspecified: Secondary | ICD-10-CM | POA: Insufficient documentation

## 2023-03-23 DIAGNOSIS — D72829 Elevated white blood cell count, unspecified: Secondary | ICD-10-CM | POA: Insufficient documentation

## 2023-03-23 DIAGNOSIS — R1084 Generalized abdominal pain: Secondary | ICD-10-CM | POA: Diagnosis not present

## 2023-03-23 DIAGNOSIS — R1030 Lower abdominal pain, unspecified: Secondary | ICD-10-CM | POA: Diagnosis present

## 2023-03-23 DIAGNOSIS — R7989 Other specified abnormal findings of blood chemistry: Secondary | ICD-10-CM | POA: Insufficient documentation

## 2023-03-23 DIAGNOSIS — K5900 Constipation, unspecified: Secondary | ICD-10-CM | POA: Diagnosis not present

## 2023-03-23 LAB — COMPREHENSIVE METABOLIC PANEL
ALT: 31 U/L (ref 0–44)
AST: 33 U/L (ref 15–41)
Albumin: 4.2 g/dL (ref 3.5–5.0)
Alkaline Phosphatase: 170 U/L — ABNORMAL HIGH (ref 38–126)
Anion gap: 13 (ref 5–15)
BUN: 37 mg/dL — ABNORMAL HIGH (ref 8–23)
CO2: 21 mmol/L — ABNORMAL LOW (ref 22–32)
Calcium: 8.8 mg/dL — ABNORMAL LOW (ref 8.9–10.3)
Chloride: 104 mmol/L (ref 98–111)
Creatinine, Ser: 0.61 mg/dL (ref 0.44–1.00)
GFR, Estimated: >60 mL/min (ref >60)
Glucose, Bld: 172 mg/dL — ABNORMAL HIGH (ref 70–99)
Potassium: 3.6 mmol/L (ref 3.5–5.1)
Sodium: 138 mmol/L (ref 135–145)
Total Bilirubin: 1.3 mg/dL — ABNORMAL HIGH (ref 0.0–1.2)
Total Protein: 7.8 g/dL (ref 6.5–8.1)

## 2023-03-23 LAB — CBC WITH DIFFERENTIAL/PLATELET
Abs Immature Granulocytes: 0.05 10*3/uL (ref 0.00–0.07)
Basophils Absolute: 0 10*3/uL (ref 0.0–0.1)
Basophils Relative: 0 %
Eosinophils Absolute: 0 10*3/uL (ref 0.0–0.5)
Eosinophils Relative: 0 %
HCT: 50.8 % — ABNORMAL HIGH (ref 36.0–46.0)
Hemoglobin: 16.5 g/dL — ABNORMAL HIGH (ref 12.0–15.0)
Immature Granulocytes: 0 %
Lymphocytes Relative: 3 %
Lymphs Abs: 0.5 10*3/uL — ABNORMAL LOW (ref 0.7–4.0)
MCH: 28.4 pg (ref 26.0–34.0)
MCHC: 32.5 g/dL (ref 30.0–36.0)
MCV: 87.6 fL (ref 80.0–100.0)
Monocytes Absolute: 0.3 10*3/uL (ref 0.1–1.0)
Monocytes Relative: 2 %
Neutro Abs: 15.3 10*3/uL — ABNORMAL HIGH (ref 1.7–7.7)
Neutrophils Relative %: 95 %
Platelets: 370 10*3/uL (ref 150–400)
RBC: 5.8 MIL/uL — ABNORMAL HIGH (ref 3.87–5.11)
RDW: 16.1 % — ABNORMAL HIGH (ref 11.5–15.5)
WBC: 16.2 10*3/uL — ABNORMAL HIGH (ref 4.0–10.5)
nRBC: 0 % (ref 0.0–0.2)

## 2023-03-23 LAB — LIPASE, BLOOD: Lipase: 26 U/L (ref 11–51)

## 2023-03-23 MED ORDER — AMOXICILLIN-POT CLAVULANATE 875-125 MG PO TABS: 1.0000 | ORAL_TABLET | Freq: Two times a day (BID) | ORAL | 0 refills | Status: DC

## 2023-03-23 MED ORDER — KETOROLAC TROMETHAMINE 30 MG/ML IJ SOLN
15.0000 mg | Freq: Once | INTRAMUSCULAR | Status: AC
  Administered 2023-03-23: 15 mg via INTRAVENOUS
  Filled 2023-03-23: qty 1

## 2023-03-23 MED ORDER — ONDANSETRON HCL 4 MG PO TABS: 4.0000 mg | ORAL_TABLET | Freq: Four times a day (QID) | ORAL | 0 refills | Status: DC

## 2023-03-23 MED ORDER — ONDANSETRON HCL 4 MG/2ML IJ SOLN
4.0000 mg | Freq: Once | INTRAMUSCULAR | Status: AC
  Administered 2023-03-23: 4 mg via INTRAVENOUS
  Filled 2023-03-23: qty 2

## 2023-03-23 MED ORDER — HYDROCODONE-ACETAMINOPHEN 5-325 MG PO TABS: 2.0000 | ORAL_TABLET | ORAL | 0 refills | Status: DC | PRN

## 2023-03-23 MED ORDER — IOHEXOL 300 MG/ML  SOLN
100.0000 mL | Freq: Once | INTRAMUSCULAR | Status: AC | PRN
  Administered 2023-03-23: 100 mL via INTRAVENOUS

## 2023-03-23 MED ORDER — AMOXICILLIN-POT CLAVULANATE 875-125 MG PO TABS
1.0000 | ORAL_TABLET | Freq: Once | ORAL | Status: AC
  Administered 2023-03-23: 1 via ORAL
  Filled 2023-03-23: qty 1

## 2023-03-23 MED ORDER — HYDROMORPHONE HCL 1 MG/ML IJ SOLN
1.0000 mg | Freq: Once | INTRAMUSCULAR | Status: AC
Start: 2023-03-23 — End: 2023-03-23
  Administered 2023-03-23: 1 mg via INTRAVENOUS
  Filled 2023-03-23 (×2): qty 1

## 2023-03-23 NOTE — ED Notes (Signed)
 Pt came in with BM down pants, changed pt , changed pt linen , pt sitting in bathroom.

## 2023-03-23 NOTE — Discharge Instructions (Addendum)
 Evaluation today revealed acute colitis which is inflammation of your large intestines likely secondary to infection.  Most of the time it is viral but given that your white blood cell count was high and you have had abdominal pain we will treat for bacterial colitis empirically.  I have sent Augmentin  to your pharmacy.  Also send Norco for pain and Zofran  for nausea and vomiting.  Please follow-up your PCP.  If your abdominal pain worsens, you develop a fever, persistent nausea vomiting diarrhea, inability to tolerate fluid intake or any other concerning symptom please return emerged part further evaluation.

## 2023-03-23 NOTE — ED Notes (Signed)
Pt able to tolerate fluids and crackers

## 2023-03-23 NOTE — ED Triage Notes (Addendum)
 Pt BIBA with urinary retention and abdominal pain since yesterday. Diarrhea developed today.    Pt states exposure to someone at work with N/V/D.

## 2023-03-23 NOTE — ED Provider Notes (Addendum)
 [place] EMERGENCY DEPARTMENT AT [facility] Provider Note   CSN: [id number] Arrival date & time: 03/23/23  9241     History  Chief Complaint  Patient presents with   Urinary Retention   HPI Lorraine [name] is a 73 y.o. female with A-fib, chronic low back pain, breast cancer presenting for abdominal pain.  Started around 11 PM last night.  Pain is located all about the lower abdomen.  States she felt like she needed to urinate at that time and could not.  Concern for urinary retention.  Also states she is developed diarrhea earlier this morning as well.  Denies nausea and vomiting.  States she was exposed to someone at work who was having persistent diarrhea last week.  Denies fever.  HPI     Home Medications Prior to Admission medications   Medication Sig Start Date End Date Taking? Authorizing Provider  amoxicillin -clavulanate (AUGMENTIN ) 875-125 MG tablet Take 1 tablet by mouth every 12 (twelve) hours. 03/23/23  Yes Tayana Shankle K, PA-C  HYDROcodone -acetaminophen  (NORCO/VICODIN) 5-325 MG tablet Take 2 tablets by mouth every 4 (four) hours as needed. 03/23/23  Yes Omaira Mellen K, PA-C  ondansetron  (ZOFRAN ) 4 MG tablet Take 1 tablet (4 mg total) by mouth every 6 (six) hours. 03/23/23  Yes Kerensa Nicklas K, PA-C  ALPRAZolam  (XANAX ) 0.5 MG tablet Take 1 tablet (0.5 mg total) by mouth 2 (two) times daily as needed for anxiety. 03/17/23   Tabori, Katherine E, MD  anastrozole  (ARIMIDEX ) 1 MG tablet Take 1 tablet (1 mg total) by mouth daily. 03/17/22   Gudena, Vinay, MD  cyanocobalamin  (VITAMIN B12) 1000 MCG/ML injection Inject 1 mL (1,000 mcg total) into the muscle once a week. 03/17/22   Gudena, Vinay, MD  gabapentin  (NEURONTIN ) 100 MG capsule TAKE ONE CAPSULE BY MOUTH THREE TIMES A DAY Patient taking differently: as needed. TAKE ONE CAPSULE BY MOUTH THREE TIMES A DAY 03/23/22   Hyatt, Max T, DPM  gabapentin  (NEURONTIN ) 300 MG capsule TAKE 1 CAPSULE BY MOUTH 3 TIMES A DAY 01/25/23    Standiford, Marsa FALCON, DPM  meloxicam  (MOBIC ) 15 MG tablet Take 1 tablet (15 mg total) by mouth daily. 12/08/22   Tabori, Katherine E, MD  polyvinyl alcohol (LIQUIFILM TEARS) 1.4 % ophthalmic solution Place 1 drop into both eyes as needed for dry eyes.    [provider]  rosuvastatin  (CRESTOR ) 5 MG tablet Take 1 tablet (5 mg total) by mouth daily. 07/27/22 07/22/23  Mona Vinie BROCKS, MD  valACYclovir  (VALTREX ) 1000 MG tablet Take 1 tablet (1,000 mg total) by mouth daily. 12/08/22   Mahlon Comer BRAVO, MD  zolpidem  (AMBIEN ) 10 MG tablet Take 1 tablet (10 mg total) by mouth at bedtime. 02/16/23   Tabori, Katherine E, MD      Allergies    Amitriptyline , Tamoxifen , Codeine, and Nortriptyline     Review of Systems   See HPI for pertinent positives  Physical Exam Updated Vital Signs BP 134/70   Pulse 97   Temp 98.1 F (36.7 C) (Oral)   Resp 18   SpO2 97%  Physical Exam Vitals and nursing note reviewed.  HENT:     Head: Normocephalic and atraumatic.     Mouth/Throat:     Mouth: Mucous membranes are moist.  Eyes:     General:        Right eye: No discharge.        Left eye: No discharge.     Conjunctiva/sclera: Conjunctivae normal.  Cardiovascular:  Rate and Rhythm: Normal rate and regular rhythm.     Pulses: Normal pulses.     Heart sounds: Normal heart sounds.  Pulmonary:     Effort: Pulmonary effort is normal.     Breath sounds: Normal breath sounds.  Abdominal:     General: Abdomen is flat.     Palpations: Abdomen is soft.     Tenderness: There is abdominal tenderness in the right lower quadrant, suprapubic area and left lower quadrant. There is no guarding or rebound.  Skin:    General: Skin is warm and dry.  Neurological:     General: No focal deficit present.  Psychiatric:        Mood and Affect: Mood normal.     ED Results / Procedures / Treatments   Labs (all labs ordered are listed, but only abnormal results are displayed) Labs Reviewed  CBC WITH  DIFFERENTIAL/PLATELET - Abnormal; Notable for the following components:      Result Value   WBC 16.2 (*)    RBC 5.80 (*)    Hemoglobin 16.5 (*)    HCT 50.8 (*)    RDW 16.1 (*)    Neutro Abs 15.3 (*)    Lymphs Abs 0.5 (*)    All other components within normal limits  COMPREHENSIVE METABOLIC PANEL - Abnormal; Notable for the following components:   CO2 21 (*)    Glucose, Bld 172 (*)    BUN 37 (*)    Calcium  8.8 (*)    Alkaline Phosphatase 170 (*)    Total Bilirubin 1.3 (*)    All other components within normal limits  LIPASE, BLOOD  URINALYSIS, ROUTINE W REFLEX MICROSCOPIC    EKG None  Radiology CT ABDOMEN PELVIS W CONTRAST Result Date: 03/23/2023 CLINICAL DATA:  73 year old female with abdominal pain since yesterday, diarrhea now. Sick contacts. EXAM: CT ABDOMEN AND PELVIS WITH CONTRAST TECHNIQUE: Multidetector CT imaging of the abdomen and pelvis was performed using the standard protocol following bolus administration of intravenous contrast. RADIATION DOSE REDUCTION: This exam was performed according to the departmental dose-optimization program which includes automated exposure control, adjustment of the mA and/or kV according to patient size and/or use of iterative reconstruction technique. CONTRAST:  OMNIPAQUE  IOHEXOL  300 MG/ML  SOLN COMPARISON:  Noncontrast CT Abdomen and Pelvis 02/03/2018. FINDINGS: Lower chest: Normal heart size. No pericardial or pleural effusion. Negative lung bases, stable chronic elevation of the left hemidiaphragm. Hepatobiliary: Negative liver and gallbladder. Pancreas: Negative. Spleen: Negative. Adrenals/Urinary Tract: Normal adrenal glands. Nonobstructed kidneys with chronic small left lower pole renal calculus. Symmetric renal enhancement and contrast excretion. Decompressed ureters and bladder. Chronic pelvic phleboliths. Stomach/Bowel: Fluid throughout nondilated colon from the hepatic flexure distally. Thickened and inflamed appearance of the large  bowel from the splenic flexure through the descending colon, involving a segment of greater than 20 cm (series 8, image 92). The right colon has a more normal appearance. The cecum is on a lax mesentery and located to the left of midline. Retained stool in the cecum and ascending colon. Terminal ileum is decompressed. Appendix is not identified. No pericecal inflammation. The small bowel is decompressed throughout the abdomen and pelvis. Small volume of retained fluid in the stomach. Duodenum is decompressed. No free air. Inflammation in the left gutter but no discrete free fluid. Vascular/Lymphatic: Aortoiliac calcified atherosclerosis. Normal caliber abdominal aorta. Major arterial structures, portal venous system are patent. No lymphadenopathy. Reproductive: Chronically absent uterus. Diminutive or absent ovaries. Other: No pelvis free fluid. Musculoskeletal:  Chronic lumbar spine degeneration including L4-L5 spondylolisthesis, facet arthropathy. No acute osseous abnormality identified. IMPRESSION: 1. Positive for long segment Acute Colitis, most pronounced in the descending colon. And fluid in the distal large bowel compatible with diarrhea. 2. No obstructed or inflamed small bowel. No other acute or inflammatory process identified in the abdomen or pelvis. 3.  Aortic Atherosclerosis (ICD10-I70.0). Electronically Signed   By: VEAR Hurst M.D.   On: 03/23/2023 11:15    Procedures Procedures    Medications Ordered in ED Medications  amoxicillin -clavulanate (AUGMENTIN ) 875-125 MG per tablet 1 tablet (has no administration in time range)  ketorolac  (TORADOL ) 30 MG/ML injection 15 mg (has no administration in time range)  ondansetron  (ZOFRAN ) injection 4 mg (4 mg Intravenous Given 03/23/23 0909)  HYDROmorphone  (DILAUDID ) injection 1 mg (1 mg Intravenous Given 03/23/23 0909)  iohexol  (OMNIPAQUE ) 300 MG/ML solution 100 mL (100 mLs Intravenous Contrast Given 03/23/23 1040)    ED Course/ Medical Decision Making/  A&P Clinical Course as of 03/23/23 1245  Wed Mar 23, 2023  1038 Initial PVR bladder scan was 70 mL per nurse [JR]    Clinical Course User Index [JR] Lang Norleen POUR, PA-C                                 Medical Decision Making Amount and/or Complexity of Data Reviewed Labs: ordered. Radiology: ordered.  Risk Prescription drug management.   Initial Impression and Ddx 73 year old well-appearing female presenting for abdominal pain and diarrhea.  Exam notable for lower generalized abdominal tenderness.  DDx includes appendicitis, diverticulitis, pyelonephritis, UTI, malignancy, other. Patient PMH that increases complexity of ED encounter:  A-fib, chronic low back pain, breast cancer  Interpretation of Diagnostics - I independent reviewed and interpreted the labs as followed: leukocytosis, elevated BUN  - I independently visualized the following imaging with scope of interpretation limited to determining acute life threatening conditions related to emergency care: CT abdomen, which revealed long segment acute colitis  Patient Reassessment and Ultimate Disposition/Management On reassessment, she stated her pain was conservatively much improved. Suspect this is infectious colitis given recent exposure and acute onset of symptoms. Given leukocytosis and abdominal pain, feel it is appropriate to go ahead and treat empirically for bacterial colitis.  Otherwise she looks well, nontoxic and workup does not suggest sepsis.  Started on Augmentin .  Fluid challenge with no complication.  Sent home with Augmentin , few tablets of Norco and Zofran .  Discussed return precautions.  Advised her to follow-up with PCP.  Vitals are normal.  Discharged in good condition.  Patient management required discussion with the following services or consulting groups:  None  Complexity of Problems Addressed Acute complicated illness or Injury  Additional Data Reviewed and Analyzed Further history obtained  from: Past medical history and medications listed in the EMR and Prior ED visit notes  Patient Encounter Risk Assessment Prescriptions     Final Clinical Impression(s) / ED Diagnoses Final diagnoses:  Acute colitis    Rx / DC Orders ED Discharge Orders          Ordered    amoxicillin -clavulanate (AUGMENTIN ) 875-125 MG tablet  Every 12 hours        03/23/23 1245    ondansetron  (ZOFRAN ) 4 MG tablet  Every 6 hours        03/23/23 1245    HYDROcodone -acetaminophen  (NORCO/VICODIN) 5-325 MG tablet  Every 4 hours PRN  03/23/23 1245              Lang Norleen POUR, PA-C 03/23/23 1246    Charlyn Sora, MD 03/25/23 1247

## 2023-03-23 NOTE — ED Notes (Signed)
 Pt urinated but the sample was contaminated

## 2023-03-25 ENCOUNTER — Inpatient Hospital Stay: Payer: Medicare Other | Admitting: Adult Health

## 2023-03-25 ENCOUNTER — Inpatient Hospital Stay: Payer: Medicare Other | Admitting: Hematology and Oncology

## 2023-03-28 ENCOUNTER — Ambulatory Visit: Payer: Self-pay | Admitting: Family Medicine

## 2023-03-28 ENCOUNTER — Ambulatory Visit: Payer: Medicare Other | Admitting: Family Medicine

## 2023-03-28 ENCOUNTER — Telehealth: Payer: Self-pay | Admitting: *Deleted

## 2023-03-28 ENCOUNTER — Telehealth: Payer: Self-pay

## 2023-03-28 ENCOUNTER — Encounter: Payer: Self-pay | Admitting: Hematology and Oncology

## 2023-03-28 ENCOUNTER — Encounter: Payer: Self-pay | Admitting: Family Medicine

## 2023-03-28 VITALS — BP 122/70 | HR 83 | Temp 98.3°F | Ht 63.0 in | Wt 179.5 lb

## 2023-03-28 DIAGNOSIS — K529 Noninfective gastroenteritis and colitis, unspecified: Secondary | ICD-10-CM

## 2023-03-28 LAB — CBC WITH DIFFERENTIAL/PLATELET
Basophils Absolute: 0.1 10*3/uL (ref 0.0–0.1)
Basophils Relative: 1.1 % (ref 0.0–3.0)
Eosinophils Absolute: 0.3 10*3/uL (ref 0.0–0.7)
Eosinophils Relative: 3.7 % (ref 0.0–5.0)
HCT: 45.7 % (ref 36.0–46.0)
Hemoglobin: 14.8 g/dL (ref 12.0–15.0)
Lymphocytes Relative: 22.9 % (ref 12.0–46.0)
Lymphs Abs: 2.1 10*3/uL (ref 0.7–4.0)
MCHC: 32.5 g/dL (ref 30.0–36.0)
MCV: 86.6 fL (ref 78.0–100.0)
Monocytes Absolute: 0.8 10*3/uL (ref 0.1–1.0)
Monocytes Relative: 8.3 % (ref 3.0–12.0)
Neutro Abs: 5.8 10*3/uL (ref 1.4–7.7)
Neutrophils Relative %: 64 % (ref 43.0–77.0)
Platelets: 393 10*3/uL (ref 150.0–400.0)
RBC: 5.28 Mil/uL — ABNORMAL HIGH (ref 3.87–5.11)
RDW: 15.3 % (ref 11.5–15.5)
WBC: 9.1 10*3/uL (ref 4.0–10.5)

## 2023-03-28 LAB — BASIC METABOLIC PANEL
BUN: 17 mg/dL (ref 6–23)
CO2: 27 meq/L (ref 19–32)
Calcium: 10 mg/dL (ref 8.4–10.5)
Chloride: 103 meq/L (ref 96–112)
Creatinine, Ser: 0.8 mg/dL (ref 0.40–1.20)
GFR: 73.44 mL/min (ref >60.00)
Glucose, Bld: 107 mg/dL — ABNORMAL HIGH (ref 70–99)
Potassium: 4 meq/L (ref 3.5–5.1)
Sodium: 141 meq/L (ref 135–145)

## 2023-03-28 LAB — HEPATIC FUNCTION PANEL
ALT: 20 U/L (ref 0–35)
AST: 13 U/L (ref 0–37)
Albumin: 4.2 g/dL (ref 3.5–5.2)
Alkaline Phosphatase: 88 U/L (ref 39–117)
Bilirubin, Direct: 0.1 mg/dL (ref 0.0–0.3)
Total Bilirubin: 0.7 mg/dL (ref 0.2–1.2)
Total Protein: 7.4 g/dL (ref 6.0–8.3)

## 2023-03-28 NOTE — Progress Notes (Signed)
   Subjective:    Patient ID: Lorraine [name], female    DOB: November 04, 1950, 73 y.o.   MRN: [id number]  HPI ER f/u- pt went to ER on 1/8 w/ abd pain and diarrhea.  WBC 16.2 w/ 15.3 Neutrophils.  Alk phos 170.  CT abd/pelvis showed long segment acute colitis.  She was feeling better in the ER so she was d/c'd w/ Augmentin , Percocet, and Zofran .  Pt reports she is unable to take the antibiotic due to abd pain.  She did not take her dose last night.  She got 4.5 days of tx.  Pt reports feeling 'extremely distended and bloated'.  Did have a 'small explosive watery' BM this morning x3.  Tm 99.  Pt is drinking fluids- has pain w/ fluids and eating.   Review of Systems For ROS see HPI     Objective:   Physical Exam Vitals reviewed.  Constitutional:      General: She is not in acute distress.    Appearance: Normal appearance. She is not ill-appearing.  HENT:     Head: Normocephalic and atraumatic.  Cardiovascular:     Rate and Rhythm: Normal rate and regular rhythm.  Pulmonary:     Effort: Pulmonary effort is normal. No respiratory distress.     Breath sounds: No wheezing or rhonchi.  Abdominal:     General: There is distension (mild).     Palpations: Abdomen is soft.     Tenderness: There is abdominal tenderness (mild). There is no guarding.  Skin:    General: Skin is warm and dry.  Neurological:     General: No focal deficit present.     Mental Status: She is alert and oriented to person, place, and time.  Psychiatric:        Mood and Affect: Mood normal.        Behavior: Behavior normal.        Thought Content: Thought content normal.           Assessment & Plan:  Colitis- new.  Reviewed ER notes, imaging, labs.  Encouraged pt to finish Augmentin  as directed as she seems to be considerably improved.  Stressed that she should take this w/ bland food and not on an empty stomach.  Encouraged her to use Zofran  prn.  Try and limit her pain medication use due to bloating and likely  constipation.  Increase fluid intake.  Repeat labs to ensure white count has improved and Alk phos has also normalized.  Reviewed supportive care and red flags that should prompt return.  Pt expressed understanding and is in agreement w/ plan.

## 2023-03-28 NOTE — Telephone Encounter (Signed)
 Chief Complaint: ER visit 03/23/23 diagnosed with colitis; moderate abdominal pain  Symptoms: lower abdominal pain, loss of appetite, weakness, diarrhea Frequency: since 03/22/23; constant Pertinent Negatives: Patient denies nausea, vomiting, chest pain, SOB, black tarry stools, blood in stools Disposition: [] ED /[] Urgent Care (no appt availability in office) / [x] Appointment(In office/virtual)/ []  [facility]/ [] Home Care/ [] Refused Recommended Disposition /[] [facility]/ []  Follow-up with PCP Additional Notes: Patient c/o moderate lower abdominal pain. She states she thinks the pain medication and nausea medication from the ED help but she states the antibiotic worsens her pain. Patient requesting to be switched to different antibiotic. Due to patient's symptom of generalized weakness, recommended patient have her friend drive her to appointment.   Copied from CRM [id number]. Topic: Clinical - Red Word Triage >> Mar 28, 2023  8:04 AM Alfonso ORN wrote: Red Word that prompted transfer to Nurse Triage: patient was at [facility] long Emergency room on wednesday  did ct scan and blood test - large intestine is inflame have an infection ,white blood cell count is up  and was put on amoxicillin  , patient lower abdomen hurts , patient stated she is not feeling well Reason for Disposition  [1] MILD-MODERATE pain AND [2] constant AND [3] present > 2 hours  Answer Assessment - Initial Assessment Questions 1. LOCATION: Where does it hurt?      Lower abdomen, across both sides. Below navel.  2. RADIATION: Does the pain shoot anywhere else? (e.g., chest, back)     Not a shooting pain.  3. ONSET: When did the pain begin? (e.g., minutes, hours or days ago)      January 7th, around 11pm at night.  4. SUDDEN: Gradual or sudden onset?     Sudden.  5. PATTERN Does the pain come and go, or is it constant?    - If it comes and goes: How long does it last? Do you have pain now?      (Note: Comes and goes means the pain is intermittent. It goes away completely between bouts.)    - If constant: Is it getting better, staying the same, or getting worse?      (Note: Constant means the pain never goes away completely; most serious pain is constant and gets worse.)      Constant; she states on Saturday she felt she was getting better but feels worse yesterday and this morning.  6. SEVERITY: How bad is the pain?  (e.g., Scale 1-10; mild, moderate, or severe)    - MILD (1-3): Doesn't interfere with normal activities, abdomen soft and not tender to touch.     - MODERATE (4-7): Interferes with normal activities or awakens from sleep, abdomen tender to touch.     - SEVERE (8-10): Excruciating pain, doubled over, unable to do any normal activities.       4/10.  7. RECURRENT SYMPTOM: Have you ever had this type of stomach pain before? If Yes, ask: When was the last time? and What happened that time?      Patient denies having colitis before.  8. CAUSE: What do you think is causing the stomach pain?     Patient was seen in ED on 03/23/23 and diagnosed with colitis.  9. RELIEVING/AGGRAVATING FACTORS: What makes it better or worse? (e.g., antacids, bending or twisting motion, bowel movement)     Unsure if zofran  is helping; antibiotic worsens the pain. States her pain medicine they gave her helps with the pain at night.  10. OTHER SYMPTOMS: Do you have any other symptoms? (e.g., back pain, diarrhea, fever, urination pain, vomiting)       Abdominal feel distended, diarrhea (last episode this AM), weakness, loss of appetite.  Protocols used: Abdominal Pain - Female-A-AH

## 2023-03-28 NOTE — Telephone Encounter (Signed)
 Patient called and stated she's sick and unable to make appt. Patient states will call back to schedule appt when she is feeling better. Appt canceled per patient request.

## 2023-03-28 NOTE — Telephone Encounter (Signed)
-----   Message from Comer Greet sent at 03/28/2023  4:14 PM EST ----- Labs look MUCH better!  White blood cell count is down in normal range, sugar looks good, and Alk phos has also returned to normal.  These are all indications that your infection is improving.

## 2023-03-28 NOTE — Telephone Encounter (Signed)
 Pt has been notified.

## 2023-03-28 NOTE — Telephone Encounter (Signed)
 FYI

## 2023-03-28 NOTE — Patient Instructions (Signed)
 Follow up as needed or as scheduled We'll notify you of your lab results and make any changes if needed Drink LOTS of fluids Continue to eat bland foods FINISH the antibiotics- make sure to take w/ food START a Probiotic to help restore gut balance Use GasX as needed for bloating REST! Call with any questions or concerns Hang in there!

## 2023-03-28 NOTE — Telephone Encounter (Signed)
 AFT - 25: COMPARING AN OPERATION TO MONITORING, WITH OR WITHOUT ENDOCRINE THERAPY (COMET) FOR LOW RISK DCIS: A PHASE III PROSPECTIVE RANDOMIZED TRIAL   Pt called today to cancel her month 54 appt on 03/31/23 due to her health.  The pt was recently in the ED on 03/23/23 for acute colitis. The research nurse called the pt to see if her research appt could be re-scheduled for February 6th at 2:45 pm.  The pt said that her current illness has hit her hard and fast.  The pt said that she would prefer a morning appt with Dr. Gudena when she recovers from her current illness.  She also said that she needs to re-schedule her mammogram appt and then she would call to re-schedule her study visit.  The nurse offered support to the pt.  The nurse will continue to follow this patient and get her scheduled for her month 54 visit on the COMET study.  Levon FREDRIK Sandifer RN, BSN, CCRP Clinical Research Nurse Lead 03/28/2023 1:34 PM

## 2023-03-29 ENCOUNTER — Encounter: Payer: Self-pay | Admitting: Family Medicine

## 2023-03-31 ENCOUNTER — Inpatient Hospital Stay: Payer: Medicare Other | Admitting: Hematology and Oncology

## 2023-03-31 ENCOUNTER — Encounter: Payer: Self-pay | Admitting: Family Medicine

## 2023-04-03 ENCOUNTER — Encounter: Payer: Self-pay | Admitting: Family Medicine

## 2023-04-10 ENCOUNTER — Other Ambulatory Visit: Payer: Self-pay | Admitting: Hematology and Oncology

## 2023-04-11 ENCOUNTER — Telehealth: Payer: Self-pay | Admitting: Family Medicine

## 2023-04-11 NOTE — Telephone Encounter (Signed)
-----   Message from [facility] C sent at 04/11/2023  3:22 PM EST ----- Regarding: AWV Patient hasn't had an AWV since 2018. Please call to see if she will set up an AWV sequential with Raynelle Fanning.

## 2023-04-11 NOTE — Telephone Encounter (Signed)
Called Pt to schedule AW visit and she responded 'Not right now' .

## 2023-04-12 ENCOUNTER — Telehealth: Payer: Self-pay | Admitting: Hematology and Oncology

## 2023-04-12 NOTE — Telephone Encounter (Signed)
Scheduled appointments per 1/28 scheduling message. Patient was made aware via voicemail. Patient is active on MyChart and will be mailed an appointment reminder.

## 2023-04-19 ENCOUNTER — Ambulatory Visit (HOSPITAL_BASED_OUTPATIENT_CLINIC_OR_DEPARTMENT_OTHER): Payer: Medicare Other

## 2023-04-19 DIAGNOSIS — I351 Nonrheumatic aortic (valve) insufficiency: Secondary | ICD-10-CM

## 2023-04-19 LAB — ECHOCARDIOGRAM COMPLETE
AV Vena cont: 0.33 cm
Area-P 1/2: 3.27 cm2
P 1/2 time: 945 ms
S' Lateral: 2.44 cm

## 2023-05-05 ENCOUNTER — Encounter: Payer: Self-pay | Admitting: Hematology and Oncology

## 2023-05-11 ENCOUNTER — Telehealth: Payer: Self-pay | Admitting: *Deleted

## 2023-05-11 ENCOUNTER — Encounter: Payer: Self-pay | Admitting: *Deleted

## 2023-05-11 ENCOUNTER — Inpatient Hospital Stay: Payer: Medicare Other | Attending: Hematology and Oncology | Admitting: Hematology and Oncology

## 2023-05-11 DIAGNOSIS — K529 Noninfective gastroenteritis and colitis, unspecified: Secondary | ICD-10-CM | POA: Diagnosis not present

## 2023-05-11 DIAGNOSIS — F419 Anxiety disorder, unspecified: Secondary | ICD-10-CM | POA: Diagnosis not present

## 2023-05-11 DIAGNOSIS — R634 Abnormal weight loss: Secondary | ICD-10-CM | POA: Insufficient documentation

## 2023-05-11 DIAGNOSIS — Z79899 Other long term (current) drug therapy: Secondary | ICD-10-CM | POA: Diagnosis not present

## 2023-05-11 DIAGNOSIS — Z79811 Long term (current) use of aromatase inhibitors: Secondary | ICD-10-CM | POA: Diagnosis not present

## 2023-05-11 DIAGNOSIS — C50312 Malignant neoplasm of lower-inner quadrant of left female breast: Secondary | ICD-10-CM

## 2023-05-11 DIAGNOSIS — M8580 Other specified disorders of bone density and structure, unspecified site: Secondary | ICD-10-CM | POA: Insufficient documentation

## 2023-05-11 DIAGNOSIS — M51369 Other intervertebral disc degeneration, lumbar region without mention of lumbar back pain or lower extremity pain: Secondary | ICD-10-CM | POA: Insufficient documentation

## 2023-05-11 DIAGNOSIS — M48061 Spinal stenosis, lumbar region without neurogenic claudication: Secondary | ICD-10-CM | POA: Diagnosis not present

## 2023-05-11 DIAGNOSIS — Z1721 Progesterone receptor positive status: Secondary | ICD-10-CM | POA: Diagnosis not present

## 2023-05-11 DIAGNOSIS — G629 Polyneuropathy, unspecified: Secondary | ICD-10-CM | POA: Diagnosis not present

## 2023-05-11 DIAGNOSIS — Z170 Estrogen receptor positive status [ER+]: Secondary | ICD-10-CM | POA: Insufficient documentation

## 2023-05-11 MED ORDER — ANASTROZOLE 1 MG PO TABS
1.0000 mg | ORAL_TABLET | Freq: Every day | ORAL | 3 refills | Status: DC
Start: 2023-05-11 — End: 2023-05-11

## 2023-05-11 MED ORDER — ANASTROZOLE 1 MG PO TABS: 1.0000 mg | ORAL_TABLET | Freq: Every day | ORAL | 3 refills | Status: AC

## 2023-05-11 MED ORDER — CYANOCOBALAMIN 1000 MCG/ML IJ SOLN: 1000.0000 ug | INTRAMUSCULAR | 6 refills | Status: DC

## 2023-05-11 NOTE — Assessment & Plan Note (Signed)
 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 COMET clinical trial randomized to active surveillance arm 2. Tamoxifen 20 mg daily started 09/20/2018 stopped 11/01/2018.  Vaginal dryness (Mona Misty Stanley treatment 11/16/2018) restarted tamoxifen 5 mg 03/30/2019 discontinued tamoxifen in August 2021. 3.  Started anastrozole 09/11/2021 (because the mammogram showed increasing calcifications)   Breast cancer surveillance: Bilateral mammogram  09/03/2021: Mild increase in left breast calcifications span 8.9 by 7.3 x 4 cm previously it was 9 x 5.4 x 4.3 cm.  We will check on the clinical trials protocol if there is any change in the management required.  I do not intend to biopsy for the slight change.   Left mammogram 09/15/22: Stable appearance of left breast calcifications 6.7 x 4.5 x 8.1 cm (previously 8.9 x 4 x 7.4 cm)   Peripheral neuropathy: Unclear etiology.  Takes B12 injections at bariatric office.    Anastrozole Toxicities: Tolerating it extremely well without any problems or concerns.  She does have bilateral hip discomfort but she has had it even before starting anastrozole therapy. Patient is keen on stopping anastrozole therapy at the end of 5 years.   Breast cancer surveillance: Breast exam 09/21/2022: Benign Mammogram Planned for 05/16/23 MRI lumbar spine 08/19/2022: Disc and facet degeneration, severe spinal stenosis L4-L5 Bone density 08/10/2022 at physicians: Osteopenia: Recommended calcium and vitamin D in addition to weightbearing exercises.   For spinal stenosis she is planning to undergo spinal stimulator implant on a clinical study.   Return to clinic in 1 year for follow-up

## 2023-05-11 NOTE — Telephone Encounter (Signed)
 RN successfully sent refill for Anastrozole to pt pharmacy on file.

## 2023-05-11 NOTE — Research (Unsigned)
 AFT - 25: COMPARING AN OPERATION TO MONITORING, WITH OR WITHOUT ENDOCRINE THERAPY (COMET) FOR LOW RISK DCIS: A PHASE III PROSPECTIVE RANDOMIZED TRIAL   Patient arrives today unaccompanied for her 54 month study visit. This visit was originally scheduled for January 2025.  This visit was delayed until 05/11/23 because of the pt's colitis.    PROs: The pt's online questionnaires are not due at this time point.     LABS: No labs were required today.  BREAST IMAGING: The pt's unilateral imaging is scheduled for 05/16/23.  The pt's imaging was previously scheduled for 03/21/23, but the pt canceled due to sickness.  The imaging center had a delayed opening due to weather on 05/06/23, and the pt's mammogram was moved until 05/16/23.    MEDICATION REVIEW: Patient states she is not taking some of her medications until she sees her GI doctor on 05/20/23.  The pt said that she stopped taking her anastrozole on 03/23/23 due to her colitis infection.  Dr. Pamelia Hoit feels that the pt's colitis is unrelated to her anastrozole.    VITAL SIGNS: Vital signs are collected per study protocol.   MD/PROVIDER VISIT: Patient was seen by Dr Pamelia Hoit for today's visit.  MD completed breast exam. The pt was originally scheduled for this visit in January 2025, but it was canceled due to her health.  The study is aware of this delay.     ADVERSE EVENTS: Patient Lorraine [name] reports AEs as below. Attributions provided by Dr Pamelia Hoit today.    ADVERSE EVENT LOG:    Study/Protocol: AFT - 25: COMPARING AN OPERATION TO MONITORING, WITH OR WITHOUT ENDOCRINE THERAPY (COMET) FOR LOW RISK DCIS: A PHASE III PROSPECTIVE RANDOMIZED TRIAL   Cycle: 54 month visit   Event Grade Attribution   Date resolved Comments  Arthralgia 2 Unrelated   Ongoing Baseline comorbidity of arthritis. Unchanged per patient  Fever  1 Unrelated   03/25/23 Pt states she ran a mild fever with her colitis that lasted until 03/25/23.    Osteopenia 1 Unrelated   Ongoing  "Osteopenia", per patient report. Patient gets bone density scans every 2 yrs  Peripheral neuropathy; sensory 2 Unrelated   Ongoing Moderate pain. Pt states that she has had "foot issues for years" that do not limit her ADLs.   Colitis  3 Unrelated  Improving ER visit on 03/23/23, discharged with antibiotics/pain medications. Seeing GI specialist on 05/20/23.  This AE does not meet criteria for expedited reporting.    Pt denies any Covid infections during this reporting period.   Not Evaluated:  Acute Coronary Syndrome, Ischemia Cerebrovascular, and cholesterol high.   Pt denies the following Solicited AE's:  Allergic reaction, hot flashes, myalgia, hypertension, nausea, and fracture.     DISPOSITION: Upon completion off all study requirements, patient was take to the scheduling desk and her next study visit, month 60, was scheduled for 11/29/2023.   The patient was thanked for their time and continued voluntary participation in this study. Patient Lorraine [name] has been provided direct contact information and is encouraged to contact this Nurse for any needs or questions.  Janan Ridge RN, BSN, CCRP Clinical Research Nurse Lead 05/11/2023 3:13 PM

## 2023-05-12 NOTE — Progress Notes (Signed)
 Patient Care Team: Sheliah Hatch, MD as PCP - General (Family Medicine) Rennis Golden Lisette Abu, MD as PCP - Cardiology (Cardiology) Lanier Prude, MD as PCP - Electrophysiology (Cardiology) Elon Spanner, Madelaine Etienne, MD as Consulting Physician (Obstetrics and Gynecology) Elinor Parkinson, [place] as Consulting Physician (Podiatry) Donnetta Hail, MD as Consulting Physician (Rheumatology) Sheral Apley, MD as Attending Physician (Orthopedic Surgery) Chrystie Nose, MD as Consulting Physician (Cardiology)  DIAGNOSIS:  Encounter Diagnosis  Name Primary?   Malignant neoplasm of lower-inner quadrant of left breast in female, estrogen receptor positive (HCC)     SUMMARY OF ONCOLOGIC HISTORY: Oncology History  Malignant neoplasm of lower-inner quadrant of left breast in female, estrogen receptor positive (HCC)  07/26/2018 Initial Diagnosis   Screening mammogram detected a 10cm span of calcifications in the left breast, biopsy confirmed intermediate grade DCIS, ER 100%, PR 100%.    07/26/2018 Cancer Staging   Staging form: Breast, AJCC 8th Edition - Clinical: Stage 0 (cTis (DCIS), cN0, cM0, ER+, PR+) - Signed by Loa Socks, NP on 07/26/2018   03/20/2019 - 10/2019 Anti-estrogen oral therapy   Tamoxifen, 5mg  daily     CHIEF COMPLIANT: Follow-up on Comet clinical trial, recent colitis  HISTORY OF PRESENT ILLNESS:   History of Present Illness   Lorraine [name] is a 73 year old female who presents with ongoing abdominal pain following a diagnosis of acute colitis.  She initially experienced severe abdominal pain and diarrhea, which began suddenly at 11 PM, prompting a visit to the emergency room. She had an urge to have a bowel movement and an inability to urinate. By 5:30 AM, she was taken to the hospital by EMS, where diarrhea began. A CT scan confirmed the diagnosis of colitis. She was treated with pain and nausea medications, and antibiotics, which she discontinued after  four days due to adverse effects. Symptoms have persisted for nearly two months, with some recent improvement after starting IB Guard, a peppermint oil supplement.  The abdominal pain is described as 'gas abdominal pain times ten,' with mornings being better and worsening around 1 or 2 PM. She has been on a bland diet, primarily consuming bone broth, rice, and chicken, and has recently started reintroducing foods. She reports significant weight loss, from 187 pounds to 173 pounds, which she attributes to her illness. She has not experienced diarrhea since the initial episode and the first week following it.  She has stopped most medications due to fear of adverse effects, continuing only her sleeping medication and occasionally Xanax for anxiety. She has also stopped taking anastrozole and vitamin B12 but wants to resume them. She takes B12 injections weekly and plans to continue.  She lives alone and managed her condition with minimal assistance from friends, who were cautious due to concerns about contagion. She has recently returned to work, although she initially found it challenging due to her symptoms.         ALLERGIES:  is allergic to amitriptyline, tamoxifen, codeine, and nortriptyline.  MEDICATIONS:  Current Outpatient Medications  Medication Sig Dispense Refill   ALPRAZolam (XANAX) 0.5 MG tablet Take 1 tablet (0.5 mg total) by mouth 2 (two) times daily as needed for anxiety. 60 tablet 1   meloxicam (MOBIC) 15 MG tablet Take 1 tablet (15 mg total) by mouth daily. 90 tablet 1   polyvinyl alcohol (LIQUIFILM TEARS) 1.4 % ophthalmic solution Place 1 drop into both eyes as needed for dry eyes.     rosuvastatin (CRESTOR)  5 MG tablet Take 1 tablet (5 mg total) by mouth daily. 90 tablet 3   valACYclovir (VALTREX) 1000 MG tablet Take 1 tablet (1,000 mg total) by mouth daily. 30 tablet 5   zolpidem (AMBIEN) 10 MG tablet Take 1 tablet (10 mg total) by mouth at bedtime. 30 tablet 3   anastrozole  (ARIMIDEX) 1 MG tablet Take 1 tablet (1 mg total) by mouth daily. 90 tablet 3   cyanocobalamin (VITAMIN B12) 1000 MCG/ML injection Inject 1 mL (1,000 mcg total) into the skin every 30 (thirty) days. 12 mL 6   gabapentin (NEURONTIN) 100 MG capsule TAKE ONE CAPSULE BY MOUTH THREE TIMES A DAY (Patient not taking: Reported on 05/11/2023) 90 capsule 0   gabapentin (NEURONTIN) 300 MG capsule TAKE 1 CAPSULE BY MOUTH 3 TIMES A DAY (Patient not taking: Reported on 05/11/2023) 90 capsule 0   No current facility-administered medications for this visit.    PHYSICAL EXAMINATION: ECOG PERFORMANCE STATUS: 1 - Symptomatic but completely ambulatory  Vitals:   05/11/23 1402  BP: (!) 143/65  Pulse: 61  Resp: 18  Temp: 98.2 F (36.8 C)  SpO2: 100%   Filed Weights   05/11/23 1402  Weight: 173 lb 1.6 oz (78.5 kg)    Physical Exam   MEASUREMENTS: Weight- 173.      (exam performed in the presence of a chaperone)  LABORATORY DATA:  I have reviewed the data as listed    Latest Ref Rng & Units 03/28/2023   10:17 AM 03/23/2023    9:00 AM 12/08/2022    8:31 AM  CMP  Glucose 70 - 99 mg/dL 161  096  99   BUN 6 - 23 mg/dL 17  37  23   Creatinine 0.40 - 1.20 mg/dL 0.45  4.09  8.11   Sodium 135 - 145 mEq/L 141  138  141   Potassium 3.5 - 5.1 mEq/L 4.0  3.6  4.1   Chloride 96 - 112 mEq/L 103  104  109   CO2 19 - 32 mEq/L 27  21  24    Calcium 8.4 - 10.5 mg/dL 91.4  8.8  9.3   Total Protein 6.0 - 8.3 g/dL 7.4  7.8  6.6   Total Bilirubin 0.2 - 1.2 mg/dL 0.7  1.3  0.9   Alkaline Phos 39 - 117 U/L 88  170  80   AST 0 - 37 U/L 13  33  16   ALT 0 - 35 U/L 20  31  23      Lab Results  Component Value Date   WBC 9.1 03/28/2023   HGB 14.8 03/28/2023   HCT 45.7 03/28/2023   MCV 86.6 03/28/2023   PLT 393.0 03/28/2023   NEUTROABS 5.8 03/28/2023    ASSESSMENT & PLAN:  Malignant neoplasm of lower-inner quadrant of left breast in female, estrogen receptor positive (HCC) 07/17/2018: Screening mammogram detected  a 10cm span of calcifications in the left breast, biopsy confirmed intermediate grade DCIS, ER 100%, PR 100% Stage 0   Current treatment:  1. COMET clinical trial randomized to active surveillance arm 2. Tamoxifen 20 mg daily started 09/20/2018 stopped 11/01/2018.  Vaginal dryness (Mona Misty Stanley treatment 11/16/2018) restarted tamoxifen 5 mg 03/30/2019 discontinued tamoxifen in August 2021. 3.  Started anastrozole 09/11/2021 (because the mammogram showed increasing calcifications)   Breast cancer surveillance: Bilateral mammogram  09/03/2021: Mild increase in left breast calcifications span 8.9 by 7.3 x 4 cm previously it was 9 x 5.4 x 4.3 cm.  We will check on the clinical trials protocol if there is any change in the management required.  I do not intend to biopsy for the slight change.   Left mammogram 09/15/22: Stable appearance of left breast calcifications 6.7 x 4.5 x 8.1 cm (previously 8.9 x 4 x 7.4 cm)   Peripheral neuropathy: Unclear etiology.  Takes B12 injections at bariatric office.    Anastrozole Toxicities: Tolerating it extremely well without any problems or concerns.  She does have bilateral hip discomfort but she has had it even before starting anastrozole therapy. Patient is keen on stopping anastrozole therapy at the end of 5 years.   Breast cancer surveillance: Breast exam 09/21/2022: Benign Mammogram Planned for 05/16/23 MRI lumbar spine 08/19/2022: Disc and facet degeneration, severe spinal stenosis L4-L5 Bone density 08/10/2022 at physicians: Osteopenia: Recommended calcium and vitamin D in addition to weightbearing exercises.   For spinal stenosis she is planning to undergo spinal stimulator implant on a clinical study.   Return to clinic in 1 year for follow-up ------------------------------------- Assessment and Plan    Acute Colitis Diagnosed via CT scan. Severe nocturnal abdominal pain followed by diarrhea. Treated with pain and antiemetic medications. Antibiotics poorly  tolerated, discontinued after four days. Symptoms improved over two months with IB Guard (peppermint oil). No current diarrhea, but persistent abdominal pain and bloating, especially in the afternoons. Significant weight loss. Hesitant to undergo colonoscopy prep due to weakness. - Continue IB Guard (peppermint oil) 2 capsules BID - Refer to gastroenterologist Dr. Marca Ancona for further evaluation on March 7, including consideration of colonoscopy with biopsies when stronger  Medication Management Discontinued most medications due to severe abdominal pain and adverse reactions. Currently taking sleeping medication, occasional Xanax for anxiety, and gastroenteropathy. Willing to resume anastrozole and vitamin B12 injections. - Resume anastrozole - Resume vitamin B12 injections weekly - Renew prescriptions for anastrozole and vitamin B12 injections  General Health Maintenance Mammogram scheduled for March 3, previously delayed due to weather. Incorporating dietary changes, including bland foods and avocado, to manage symptoms. - Perform mammogram on March 3 - Schedule follow-up appointment six months post-mammogram  Follow-up - Follow up with gastroenterologist Dr. Marca Ancona on March 7 - Schedule follow-up appointment with primary care six months post-mammogram.          No orders of the defined types were placed in this encounter.  The patient has a good understanding of the overall plan. she agrees with it. she will call with any problems that may develop before the next visit here. Total time spent: 30 mins including face to face time and time spent for planning, charting and co-ordination of care   Tamsen Meek, MD 05/12/23

## 2023-05-16 ENCOUNTER — Ambulatory Visit
Admission: RE | Admit: 2023-05-16 | Discharge: 2023-05-16 | Disposition: A | Discharge status: Home / Self Care | Payer: Medicare Other | Source: Ambulatory Visit | Attending: Hematology and Oncology

## 2023-05-16 ENCOUNTER — Other Ambulatory Visit: Payer: Self-pay | Admitting: Family Medicine

## 2023-05-16 ENCOUNTER — Other Ambulatory Visit: Payer: Self-pay | Admitting: Hematology and Oncology

## 2023-05-16 DIAGNOSIS — Z1231 Encounter for screening mammogram for malignant neoplasm of breast: Secondary | ICD-10-CM

## 2023-05-16 DIAGNOSIS — Z170 Estrogen receptor positive status [ER+]: Secondary | ICD-10-CM

## 2023-05-16 DIAGNOSIS — D0512 Intraductal carcinoma in situ of left breast: Secondary | ICD-10-CM | POA: Diagnosis not present

## 2023-05-18 ENCOUNTER — Encounter: Payer: Self-pay | Admitting: Family Medicine

## 2023-05-18 ENCOUNTER — Other Ambulatory Visit: Payer: Self-pay

## 2023-05-18 ENCOUNTER — Ambulatory Visit: Admitting: Family Medicine

## 2023-05-18 ENCOUNTER — Telehealth: Payer: Self-pay

## 2023-05-18 VITALS — BP 110/78 | HR 76 | Temp 98.6°F | Ht 63.0 in | Wt 173.0 lb

## 2023-05-18 DIAGNOSIS — J329 Chronic sinusitis, unspecified: Secondary | ICD-10-CM | POA: Diagnosis not present

## 2023-05-18 DIAGNOSIS — B9689 Other specified bacterial agents as the cause of diseases classified elsewhere: Secondary | ICD-10-CM

## 2023-05-18 DIAGNOSIS — R051 Acute cough: Secondary | ICD-10-CM | POA: Diagnosis not present

## 2023-05-18 LAB — POC INFLUENZA A&B (BINAX/QUICKVUE)
Influenza A, POC: NEGATIVE
Influenza B, POC: NEGATIVE

## 2023-05-18 MED ORDER — AMOXICILLIN 875 MG PO TABS: 875.0000 mg | ORAL_TABLET | Freq: Two times a day (BID) | ORAL | 0 refills | Status: AC

## 2023-05-18 MED ORDER — ALPRAZOLAM 0.5 MG PO TABS: 0.5000 mg | ORAL_TABLET | Freq: Two times a day (BID) | ORAL | 1 refills | Status: DC | PRN

## 2023-05-18 NOTE — Progress Notes (Signed)
   Subjective:    Patient ID: Lorraine [name], female    DOB: [birth year], 73 y.o.   MRN: [id number]  HPI URI- pt reports she developed hoarseness on Monday.  + chest congestion, cough.  + HA.  + frontal and maxillary sinus pain/pressure.  Taking OTC cold medication and flonase.  Tm 99.4     Review of Systems For ROS see HPI     Objective:   Physical Exam Vitals reviewed.  Constitutional:      General: She is not in acute distress.    Appearance: Normal appearance. She is well-developed. She is not ill-appearing.  HENT:     Head: Normocephalic and atraumatic.     Right Ear: Tympanic membrane normal.     Left Ear: Tympanic membrane normal.     Nose: Mucosal edema and congestion present. No rhinorrhea.     Right Sinus: Maxillary sinus tenderness and frontal sinus tenderness present.     Left Sinus: Maxillary sinus tenderness and frontal sinus tenderness present.     Mouth/Throat:     Pharynx: Uvula midline. Posterior oropharyngeal erythema present. No oropharyngeal exudate.  Eyes:     Conjunctiva/sclera: Conjunctivae normal.     Pupils: Pupils are equal, round, and reactive to light.  Cardiovascular:     Rate and Rhythm: Normal rate and regular rhythm.     Heart sounds: Normal heart sounds.  Pulmonary:     Effort: Pulmonary effort is normal. No respiratory distress.     Breath sounds: Normal breath sounds. No wheezing.  Musculoskeletal:     Cervical back: Normal range of motion and neck supple.  Lymphadenopathy:     Cervical: No cervical adenopathy.  Skin:    General: Skin is warm and dry.  Neurological:     General: No focal deficit present.     Mental Status: She is oriented to person, place, and time.     Cranial Nerves: No cranial nerve deficit.     Motor: No weakness.     Coordination: Coordination normal.  Psychiatric:        Mood and Affect: Mood normal.        Behavior: Behavior normal.        Thought Content: Thought content normal.           Assessment  & Plan:  Bacterial sinusitis- new.  Pt's sxs and PE consistent w/ dx.  Start Amoxicillin BID.  Reviewed supportive care and red flags that should prompt return.  Pt expressed understanding and is in agreement w/ plan.

## 2023-05-18 NOTE — Telephone Encounter (Signed)
 Sent to Dr.Tabori

## 2023-05-18 NOTE — Telephone Encounter (Signed)
 Prescription sent to pharmacy.

## 2023-05-18 NOTE — Telephone Encounter (Signed)
 LM tp inform patient "prescription has been refilled"

## 2023-05-18 NOTE — Patient Instructions (Signed)
 Follow up as needed or as scheduled START the Amoxicillin twice daily- take w/ food Drink LOTS of fluids REST! Mucinex DM or Delsym for cough Call with any questions or concerns Hang in there!!

## 2023-05-18 NOTE — Addendum Note (Signed)
 Addended by: Sheliah Hatch on: 05/18/2023 11:34 AM   Modules accepted: Orders

## 2023-05-20 DIAGNOSIS — Z860100 Personal history of colon polyps, unspecified: Secondary | ICD-10-CM | POA: Diagnosis not present

## 2023-05-20 DIAGNOSIS — R1030 Lower abdominal pain, unspecified: Secondary | ICD-10-CM | POA: Diagnosis not present

## 2023-05-20 DIAGNOSIS — R9389 Abnormal findings on diagnostic imaging of other specified body structures: Secondary | ICD-10-CM | POA: Diagnosis not present

## 2023-05-20 DIAGNOSIS — K529 Noninfective gastroenteritis and colitis, unspecified: Secondary | ICD-10-CM | POA: Diagnosis not present

## 2023-05-25 ENCOUNTER — Encounter: Payer: Self-pay | Admitting: Family Medicine

## 2023-05-25 ENCOUNTER — Ambulatory Visit (INDEPENDENT_AMBULATORY_CARE_PROVIDER_SITE_OTHER): Admitting: Family Medicine

## 2023-05-25 VITALS — BP 134/74 | HR 69 | Temp 98.4°F | Ht 63.0 in | Wt 168.2 lb

## 2023-05-25 DIAGNOSIS — R051 Acute cough: Secondary | ICD-10-CM

## 2023-05-25 MED ORDER — PREDNISONE 10 MG PO TABS: ORAL_TABLET | ORAL | 0 refills | Status: DC

## 2023-05-25 MED ORDER — ALBUTEROL SULFATE HFA 108 (90 BASE) MCG/ACT IN AERS: 2.0000 | INHALATION_SPRAY | RESPIRATORY_TRACT | 0 refills | Status: DC | PRN

## 2023-05-25 NOTE — Patient Instructions (Signed)
 Follow up as needed or as scheduled START the Prednisone as directed- 3 pills at the same time x3 days, then 2 pills at the same time x3 days, then 1 pill daily.  Take w/ food  USE the Albuterol inhaler every 4 hrs as needed- 2 puffs as needed The prednisone and albuterol will improve the underlying airway inflammation START either daily Claritin or Zyrtec daily (store brand generic works just as good!) Drink LOTS of fluids Call with any questions or concerns Hang in there!!

## 2023-05-25 NOTE — Progress Notes (Signed)
   Subjective:    Patient ID: Lorraine [name], female    DOB: 08-28-1950, 73 y.o.   MRN: [id number]  HPI Cough- pt reports sinuses are improving w/ the Amox.  Pt reports that for the last 2 days, she has had coughing fits that will last 30 minutes or longer.  She reports she feels 'strangled'.  [name] will get flushed, her eyes will get red.  Cough will result in retching.  Supervisor is asking if she has ever used an inhaler.  Review of Systems For ROS see HPI     Objective:   Physical Exam Vitals reviewed.  Constitutional:      General: She is not in acute distress.    Appearance: Normal appearance. She is not ill-appearing.  HENT:     Head: Normocephalic and atraumatic.     Right Ear: Tympanic membrane and ear canal normal.     Left Ear: Tympanic membrane and ear canal normal.     Nose: Congestion present. No rhinorrhea.     Comments: No TTP over frontal or maxillary sinuses    Mouth/Throat:     Pharynx: Oropharynx is clear. No oropharyngeal exudate or posterior oropharyngeal erythema.  Eyes:     Extraocular Movements: Extraocular movements intact.     Conjunctiva/sclera: Conjunctivae normal.  Cardiovascular:     Rate and Rhythm: Normal rate and regular rhythm.  Pulmonary:     Effort: Pulmonary effort is normal. No respiratory distress.     Breath sounds: No wheezing or rhonchi.     Comments: Cough w/ deep breath, talking, or laughing Musculoskeletal:     Cervical back: Neck supple.  Lymphadenopathy:     Cervical: No cervical adenopathy.  Skin:    General: Skin is warm and dry.  Neurological:     General: No focal deficit present.     Mental Status: She is alert and oriented to person, place, and time.  Psychiatric:        Mood and Affect: Mood normal.        Behavior: Behavior normal.        Thought Content: Thought content normal.           Assessment & Plan:  Cough- deteriorated.  Pt's sinuses are improved w/ use of abx but cough is worsening.  Will cough to  the point of feeling 'strangled'.  Coughing comes in fits suggestive of airway inflammation/irritation.  Start Prednisone taper and albuterol inhaler to reduce inflammation and spasm.  Reviewed supportive care and red flags that should prompt return.  Pt expressed understanding and is in agreement w/ plan.

## 2023-06-08 ENCOUNTER — Ambulatory Visit: Payer: Medicare Other | Admitting: Family Medicine

## 2023-06-11 ENCOUNTER — Encounter: Payer: Self-pay | Admitting: Family Medicine

## 2023-06-14 MED ORDER — MELOXICAM 15 MG PO TABS: 15.0000 mg | ORAL_TABLET | Freq: Every day | ORAL | 1 refills | Status: DC

## 2023-06-14 MED ORDER — ZOLPIDEM TARTRATE 10 MG PO TABS: 10.0000 mg | ORAL_TABLET | Freq: Every day | ORAL | 3 refills | Status: DC

## 2023-06-14 NOTE — Telephone Encounter (Signed)
 Requested Prescriptions   Pending Prescriptions Disp Refills   meloxicam (MOBIC) 15 MG tablet 90 tablet 1    Sig: Take 1 tablet (15 mg total) by mouth daily.   zolpidem (AMBIEN) 10 MG tablet 30 tablet 3    Sig: Take 1 tablet (10 mg total) by mouth at bedtime.     Date of patient request: 06/14/2023 Last office visit: 05/25/2023 Upcoming visit: 06/23/2023 Date of last refill: 02/16/2023, 12/08/2022 Last refill amount: 30x3, 90x1

## 2023-06-15 ENCOUNTER — Telehealth: Payer: Self-pay

## 2023-06-15 ENCOUNTER — Other Ambulatory Visit (HOSPITAL_COMMUNITY): Payer: Self-pay

## 2023-06-15 DIAGNOSIS — R194 Change in bowel habit: Secondary | ICD-10-CM | POA: Diagnosis not present

## 2023-06-15 DIAGNOSIS — D122 Benign neoplasm of ascending colon: Secondary | ICD-10-CM | POA: Diagnosis not present

## 2023-06-15 DIAGNOSIS — R1030 Lower abdominal pain, unspecified: Secondary | ICD-10-CM | POA: Diagnosis not present

## 2023-06-15 DIAGNOSIS — K5730 Diverticulosis of large intestine without perforation or abscess without bleeding: Secondary | ICD-10-CM | POA: Diagnosis not present

## 2023-06-15 DIAGNOSIS — D123 Benign neoplasm of transverse colon: Secondary | ICD-10-CM | POA: Diagnosis not present

## 2023-06-15 DIAGNOSIS — Z860101 Personal history of adenomatous and serrated colon polyps: Secondary | ICD-10-CM | POA: Diagnosis not present

## 2023-06-15 DIAGNOSIS — Z09 Encounter for follow-up examination after completed treatment for conditions other than malignant neoplasm: Secondary | ICD-10-CM | POA: Diagnosis not present

## 2023-06-15 LAB — HM COLONOSCOPY

## 2023-06-15 NOTE — Telephone Encounter (Signed)
 Pharmacy Patient Advocate Encounter   Received notification from Pt Calls Messages that prior authorization for ZOLPIDEM 10MG  is required/requested.   Insurance verification completed.   The patient is insured through [employer] .   Per test claim: The current 30 day co-pay is, $1.68.  No PA needed at this time. This test claim was processed through [facility]- copay amounts may vary at other pharmacies due to pharmacy/plan contracts, or as the patient moves through the different stages of their insurance plan.

## 2023-06-15 NOTE — Telephone Encounter (Signed)
 Sent to PA team.

## 2023-06-15 NOTE — Telephone Encounter (Signed)
 Per test claim: The current 30 day co-pay is, $1.68.  No PA needed at this time.

## 2023-06-17 DIAGNOSIS — D122 Benign neoplasm of ascending colon: Secondary | ICD-10-CM | POA: Diagnosis not present

## 2023-06-17 DIAGNOSIS — D123 Benign neoplasm of transverse colon: Secondary | ICD-10-CM | POA: Diagnosis not present

## 2023-06-23 ENCOUNTER — Ambulatory Visit: Admitting: Family Medicine

## 2023-06-23 ENCOUNTER — Encounter: Payer: Self-pay | Admitting: Family Medicine

## 2023-06-23 VITALS — BP 130/84 | HR 95 | Ht 64.0 in | Wt 165.0 lb

## 2023-06-23 DIAGNOSIS — R1030 Lower abdominal pain, unspecified: Secondary | ICD-10-CM

## 2023-06-23 DIAGNOSIS — E785 Hyperlipidemia, unspecified: Secondary | ICD-10-CM

## 2023-06-23 DIAGNOSIS — M8580 Other specified disorders of bone density and structure, unspecified site: Secondary | ICD-10-CM

## 2023-06-23 DIAGNOSIS — E559 Vitamin D deficiency, unspecified: Secondary | ICD-10-CM | POA: Diagnosis not present

## 2023-06-23 LAB — BASIC METABOLIC PANEL WITH GFR
BUN: 17 mg/dL (ref 6–23)
CO2: 24 meq/L (ref 19–32)
Calcium: 10 mg/dL (ref 8.4–10.5)
Chloride: 105 meq/L (ref 96–112)
Creatinine, Ser: 0.74 mg/dL (ref 0.40–1.20)
GFR: 80.51 mL/min (ref >60.00)
Glucose, Bld: 81 mg/dL (ref 70–99)
Potassium: 4.5 meq/L (ref 3.5–5.1)
Sodium: 139 meq/L (ref 135–145)

## 2023-06-23 LAB — LIPID PANEL
Cholesterol: 272 mg/dL — ABNORMAL HIGH (ref 0–200)
HDL: 60 mg/dL (ref >39.00)
LDL Cholesterol: 195 mg/dL — ABNORMAL HIGH (ref 0–99)
NonHDL: 212.23
Total CHOL/HDL Ratio: 5
Triglycerides: 85 mg/dL (ref 0.0–149.0)
VLDL: 17 mg/dL (ref 0.0–40.0)

## 2023-06-23 LAB — CBC WITH DIFFERENTIAL/PLATELET
Basophils Absolute: 0.1 10*3/uL (ref 0.0–0.1)
Basophils Relative: 1 % (ref 0.0–3.0)
Eosinophils Absolute: 0.4 10*3/uL (ref 0.0–0.7)
Eosinophils Relative: 6.5 % — ABNORMAL HIGH (ref 0.0–5.0)
HCT: 47 % — ABNORMAL HIGH (ref 36.0–46.0)
Hemoglobin: 15.5 g/dL — ABNORMAL HIGH (ref 12.0–15.0)
Lymphocytes Relative: 36.7 % (ref 12.0–46.0)
Lymphs Abs: 2.3 10*3/uL (ref 0.7–4.0)
MCHC: 33 g/dL (ref 30.0–36.0)
MCV: 86.6 fl (ref 78.0–100.0)
Monocytes Absolute: 0.4 10*3/uL (ref 0.1–1.0)
Monocytes Relative: 6.7 % (ref 3.0–12.0)
Neutro Abs: 3.1 10*3/uL (ref 1.4–7.7)
Neutrophils Relative %: 49.1 % (ref 43.0–77.0)
Platelets: 269 10*3/uL (ref 150.0–400.0)
RBC: 5.43 Mil/uL — ABNORMAL HIGH (ref 3.87–5.11)
RDW: 16.3 % — ABNORMAL HIGH (ref 11.5–15.5)
WBC: 6.3 10*3/uL (ref 4.0–10.5)

## 2023-06-23 LAB — VITAMIN D 25 HYDROXY (VIT D DEFICIENCY, FRACTURES): VITD: 41.74 ng/mL (ref 30.00–100.00)

## 2023-06-23 LAB — HEPATIC FUNCTION PANEL
ALT: 17 U/L (ref 0–35)
AST: 24 U/L (ref 0–37)
Albumin: 4.9 g/dL (ref 3.5–5.2)
Alkaline Phosphatase: 92 U/L (ref 39–117)
Bilirubin, Direct: 0.1 mg/dL (ref 0.0–0.3)
Total Bilirubin: 0.9 mg/dL (ref 0.2–1.2)
Total Protein: 7.5 g/dL (ref 6.0–8.3)

## 2023-06-23 LAB — TSH: TSH: 0.8 u[IU]/mL (ref 0.35–5.50)

## 2023-06-23 MED ORDER — DICYCLOMINE HCL 10 MG PO CAPS: 10.0000 mg | ORAL_CAPSULE | Freq: Three times a day (TID) | ORAL | 1 refills | Status: DC

## 2023-06-23 NOTE — Progress Notes (Signed)
   Subjective:    Patient ID: Lorraine [name], female    DOB: 18-Oct-1950, 73 y.o.   MRN: [id number]  HPI Hyperlipidemia- chronic problem, on Crestor  5mg  daily.  Stopped statin in January due to illness.  Denies CP, SOB.  Ongoing abd pain since prior to recent colonoscopy.  'it feels like gas I can't pass x10'.  Worsens as the day goes on.  Relieved by lying down.  Some relief w/ IB guard.   Review of Systems For ROS see HPI     Objective:   Physical Exam Vitals reviewed.  Constitutional:      General: She is not in acute distress.    Appearance: Normal appearance. She is well-developed. She is not ill-appearing.  HENT:     Head: Normocephalic and atraumatic.  Eyes:     Conjunctiva/sclera: Conjunctivae normal.     Pupils: Pupils are equal, round, and reactive to light.  Neck:     Thyroid : No thyromegaly.  Cardiovascular:     Rate and Rhythm: Normal rate and regular rhythm.     Pulses: Normal pulses.     Heart sounds: Normal heart sounds. No murmur heard. Pulmonary:     Effort: Pulmonary effort is normal. No respiratory distress.     Breath sounds: Normal breath sounds.  Abdominal:     General: There is no distension.     Palpations: Abdomen is soft.     Tenderness: There is no abdominal tenderness.  Musculoskeletal:     Cervical back: Normal range of motion and neck supple.  Lymphadenopathy:     Cervical: No cervical adenopathy.  Skin:    General: Skin is warm and dry.  Neurological:     Mental Status: She is alert and oriented to person, place, and time.  Psychiatric:        Behavior: Behavior normal.           Assessment & Plan:  Lower abd pain- new to provider, ongoing for pt.  Sxs started prior to colonoscopy and have continued despite recent clean out and normal endoscopic findings.  Sxs improve w/ lying down and some relief w/ IB guard.  Sxs may be IBS related.  Will start Dicyclomine  to see if she has some relief.  Pt expressed understanding and is in  agreement w/ plan.

## 2023-06-23 NOTE — Patient Instructions (Signed)
 Follow up in 6 months to recheck cholesterol- sooner if needed We'll notify you of your lab results and make any changes if needed USE the Dicyclomine as needed for abdominal pressure/pain Call with any questions or concerns Stay Safe!  Stay Healthy! HAPPY BIRTHDAY!!

## 2023-06-24 ENCOUNTER — Other Ambulatory Visit: Payer: Self-pay

## 2023-06-24 ENCOUNTER — Telehealth: Payer: Self-pay

## 2023-06-24 ENCOUNTER — Encounter: Payer: Self-pay | Admitting: Family Medicine

## 2023-06-24 MED ORDER — ROSUVASTATIN CALCIUM 5 MG PO TABS: 5.0000 mg | ORAL_TABLET | Freq: Every day | ORAL | 3 refills | Status: AC

## 2023-06-24 NOTE — Telephone Encounter (Signed)
-----   Message from Neena Rhymes sent at 06/24/2023  7:42 AM EDT ----- Your total cholesterol and LDL (bad cholesterol) have both jumped well above goal!  Based on this, you need to restart your Crestor nightly (#90, 1 refill)

## 2023-06-24 NOTE — Telephone Encounter (Signed)
 Copied from CRM [id number]. Topic: Clinical - Lab/Test Results >> Jun 24, 2023 10:00 AM [place] J wrote: Reason for CRM: Patient calling back from missed call today. She has reviewed her test results via MyChart.

## 2023-06-27 ENCOUNTER — Encounter: Payer: Self-pay | Admitting: Family Medicine

## 2023-06-27 DIAGNOSIS — R1030 Lower abdominal pain, unspecified: Secondary | ICD-10-CM

## 2023-06-28 NOTE — Telephone Encounter (Signed)
 Patient asking if we can change Dicyclomine to Hycosamine please advise

## 2023-07-04 MED ORDER — HYOSCYAMINE SULFATE 0.125 MG PO TABS: 0.1250 mg | ORAL_TABLET | ORAL | 0 refills | Status: DC | PRN

## 2023-07-04 NOTE — Telephone Encounter (Signed)
 [facility] VISIT   Patient agreed to [facility] visit and is aware that copayment and coinsurance may apply. Patient was treated using telemedicine according to accepted telemedicine protocols.  Subjective:   Patient complains of ongoing abd pain/IBS  Patient Active Problem List   Diagnosis Date Noted   Obesity (BMI 30-39.9) 12/08/2022   Chronic bilateral low back pain without sciatica 12/08/2022   Presence of Watchman left atrial appendage closure device 06/12/2021   Atrial fibrillation (HCC) 06/11/2021   Prolapse of female genital organs 07/09/2020   Candidiasis 12/19/2019   Chronic pain of both shoulders 11/28/2019   Malignant neoplasm of lower-inner quadrant of left breast in female, estrogen receptor positive (HCC) 07/26/2018   Anxiety about health 05/31/2018   Paroxysmal atrial fibrillation (HCC) 05/18/2018   Systolic murmur 07/14/2017   Shingles 11/23/2016   Arthritis 05/26/2016   Insomnia 05/26/2016   Neuropathy 05/26/2016   Positive ANA (antinuclear antibody) 05/26/2016   Hyperlipidemia 05/26/2016   Neck and shoulder pain 01/26/2014   Cervicalgia 01/26/2014   Enthesopathy of ankle and tarsus 05/06/2010   PLANTAR FASCIITIS, LEFT 05/06/2010   FOOT PAIN, BILATERAL 05/06/2010   Social History   Tobacco Use   Smoking status: Never   Smokeless tobacco: Never  Substance Use Topics   Alcohol use: Not Currently    Comment: rare    Current Outpatient Medications:    hyoscyamine  (LEVSIN) 0.125 MG tablet, Take 1 tablet (0.125 mg total) by mouth every 4 (four) hours as needed., Disp: 30 tablet, Rfl: 0   albuterol  (VENTOLIN  HFA) 108 (90 Base) MCG/ACT inhaler, Inhale 2 puffs into the lungs every 4 (four) hours as needed for wheezing or shortness of breath., Disp: 8 g, Rfl: 0   ALPRAZolam  (XANAX ) 0.5 MG tablet, Take 1 tablet (0.5 mg total) by mouth 2 (two) times daily as needed for anxiety., Disp: 60 tablet, Rfl: 1   anastrozole  (ARIMIDEX ) 1 MG tablet, Take 1 tablet (1 mg total) by  mouth daily., Disp: 90 tablet, Rfl: 3   cyanocobalamin  (VITAMIN B12) 1000 MCG/ML injection, Inject 1 mL (1,000 mcg total) into the skin every 30 (thirty) days., Disp: 12 mL, Rfl: 6   dicyclomine  (BENTYL ) 10 MG capsule, Take 1 capsule (10 mg total) by mouth 3 (three) times daily before meals., Disp: 90 capsule, Rfl: 1   gabapentin  (NEURONTIN ) 100 MG capsule, TAKE ONE CAPSULE BY MOUTH THREE TIMES A DAY, Disp: 90 capsule, Rfl: 0   gabapentin  (NEURONTIN ) 300 MG capsule, TAKE 1 CAPSULE BY MOUTH 3 TIMES A DAY, Disp: 90 capsule, Rfl: 0   meloxicam  (MOBIC ) 15 MG tablet, Take 1 tablet (15 mg total) by mouth daily., Disp: 90 tablet, Rfl: 1   polyvinyl alcohol (LIQUIFILM TEARS) 1.4 % ophthalmic solution, Place 1 drop into both eyes as needed for dry eyes., Disp: , Rfl:    rosuvastatin  (CRESTOR ) 5 MG tablet, Take 1 tablet (5 mg total) by mouth daily., Disp: 90 tablet, Rfl: 3   valACYclovir  (VALTREX ) 1000 MG tablet, Take 1 tablet (1,000 mg total) by mouth daily., Disp: 30 tablet, Rfl: 5   zolpidem  (AMBIEN ) 10 MG tablet, Take 1 tablet (10 mg total) by mouth at bedtime., Disp: 30 tablet, Rfl: 3  Allergies  Allergen Reactions   Amitriptyline  Nausea Only    Bladder retention   Tamoxifen     Codeine Nausea And Vomiting and Rash   Nortriptyline  Rash    Bladder retention     Assessment and Plan:   Diagnosis: lower abd pain. Please see myChart communication  and orders below.   No orders of the defined types were placed in this encounter.  Meds ordered this encounter  Medications   hyoscyamine  (LEVSIN) 0.125 MG tablet    Sig: Take 1 tablet (0.125 mg total) by mouth every 4 (four) hours as needed.    Dispense:  30 tablet    Refill:  0    Laymon Priest, MD 07/04/2023  A total of 6 minutes were spent by me to personally review the patient-generated inquiry, review patient records and data pertinent to assessment of the patient's problem, develop a management plan including generation of prescriptions  and/or orders, and on subsequent communication with the patient through secure the MyChart portal service.   There is no separately reported E/M service related to this service in the past 7 days nor does the patient have an upcoming soonest available appointment for this issue. This work was completed in less than 7 days.   The patient consented to this service today (see patient agreement prior to ongoing communication). Patient counseled regarding the need for in-person exam for certain conditions and was advised to call the office if any changing or worsening symptoms occur.   The codes to be used for the E/M service are: [x]   99421 for 5-10 minutes of time spent on the inquiry. []   [id number] for 11-20 minutes. []   [id number] for 21+ minutes.

## 2023-07-05 ENCOUNTER — Other Ambulatory Visit: Payer: Self-pay | Admitting: Podiatry

## 2023-07-13 DIAGNOSIS — M8580 Other specified disorders of bone density and structure, unspecified site: Secondary | ICD-10-CM | POA: Insufficient documentation

## 2023-07-13 NOTE — Assessment & Plan Note (Signed)
 Chronic problem.  Stopped her statin in January when she was ill.  Never restarted.  Check labs and restart meds prn.  Pt expressed understanding and is in agreement w/ plan.

## 2023-07-13 NOTE — Assessment & Plan Note (Signed)
 Check Vit D level

## 2023-07-21 ENCOUNTER — Other Ambulatory Visit: Payer: Self-pay

## 2023-07-21 MED ORDER — ALPRAZOLAM 0.5 MG PO TABS: 0.5000 mg | ORAL_TABLET | Freq: Two times a day (BID) | ORAL | 1 refills | Status: DC | PRN

## 2023-07-26 ENCOUNTER — Encounter: Payer: Self-pay | Admitting: Family Medicine

## 2023-07-26 DIAGNOSIS — K5790 Diverticulosis of intestine, part unspecified, without perforation or abscess without bleeding: Secondary | ICD-10-CM | POA: Diagnosis not present

## 2023-07-26 DIAGNOSIS — Z860100 Personal history of colon polyps, unspecified: Secondary | ICD-10-CM | POA: Diagnosis not present

## 2023-07-26 DIAGNOSIS — K589 Irritable bowel syndrome without diarrhea: Secondary | ICD-10-CM | POA: Diagnosis not present

## 2023-08-02 ENCOUNTER — Telehealth: Payer: Self-pay

## 2023-08-02 ENCOUNTER — Encounter: Payer: Self-pay | Admitting: Cardiology

## 2023-08-02 NOTE — Telephone Encounter (Signed)
 Called to check-in with patient. She had LAAO 06/11/2021.  Left message to call back. Will offer to arrange follow-up with Dr. Maximo Spar.

## 2023-08-18 ENCOUNTER — Telehealth: Payer: Self-pay

## 2023-08-18 NOTE — Telephone Encounter (Signed)
 No advice to give based on the limited information provided.  Needs an appt to further discuss and get accurate BP readings

## 2023-08-18 NOTE — Telephone Encounter (Signed)
 Called pt to get clarification on B/P reading Pt reports her B/P has changes since 06/23/2023 She went to Gastrology office 5/13 and numbers were 180s-90s She has been monitoring since and numbers are 180s-80-90s  Pt reports she will not see anyone except Dr.Tabori- Offered appt with another provider and she refused Pt has appt 07/26/2023 She denies -headaches,dizziness, chest pain.

## 2023-08-18 NOTE — Telephone Encounter (Signed)
 Copied from CRM [id number]. Topic: Clinical - Red Word Triage >> Aug 18, 2023  8:28 AM Martinique E wrote: [facility] that prompted transfer to Nurse Triage: Fluctuation in blood pressure. Reading this morning was 175/95, and last night it was 134/72. Patient is worried as it is not normal for her BP to be sporadic. *Patient denied nurse triage for this issue.* Patient questioning if she can get a call before 9-9:30 as she will be out of the house. Callback number [phone].

## 2023-08-18 NOTE — Telephone Encounter (Signed)
 Called patient to make an appointment for Blood pressure assessment no answer, LM to call back I was going to make an appointment. Anything additional to advise?

## 2023-08-19 ENCOUNTER — Other Ambulatory Visit: Payer: Self-pay

## 2023-08-19 MED ORDER — DICYCLOMINE HCL 10 MG PO CAPS: 10.0000 mg | ORAL_CAPSULE | Freq: Three times a day (TID) | ORAL | 1 refills | Status: DC

## 2023-08-26 ENCOUNTER — Ambulatory Visit: Admitting: Family Medicine

## 2023-08-26 ENCOUNTER — Encounter: Payer: Self-pay | Admitting: Family Medicine

## 2023-08-26 VITALS — BP 140/80 | HR 62 | Temp 98.2°F | Wt 163.4 lb

## 2023-08-26 DIAGNOSIS — R030 Elevated blood-pressure reading, without diagnosis of hypertension: Secondary | ICD-10-CM

## 2023-08-26 DIAGNOSIS — J0110 Acute frontal sinusitis, unspecified: Secondary | ICD-10-CM

## 2023-08-26 MED ORDER — PREDNISONE 10 MG PO TABS: ORAL_TABLET | ORAL | 0 refills | Status: DC

## 2023-08-26 NOTE — Patient Instructions (Addendum)
 Follow up in 6 weeks to recheck blood pressure If you are going to check home BP's, get a new cuff (but you don't have to) Drink LOTS of water START the Prednisone  as directed- 3 pills at the same time x3 days, then 2 pills at the same time x3 days, then 1 pill daily.  Take w/ food  Call with any questions or concerns Stay Safe!  Stay Healthy! Have a great summer!!!

## 2023-08-26 NOTE — Progress Notes (Unsigned)
   Subjective:    Patient ID: Lorraine [name], female    DOB: 1950/12/01, 72 y.o.   MRN: [id number]  HPI Elevated BP- BP at GI was elevated at 180/80.  pt reports home readings have been high.  BP log shows readings that range from 99/60-191/91.  Today her BP machine is not accurate when compared to our cuff- her's reads 173/98 and ours is 140/80.  Pt reports R frontal HA that radiates into R ear.  No relief w/ Tylenol , some improvement w/ Mucinex.  Denies CP, SOB, visual changes, edema.   Review of Systems For ROS see HPI     Objective:   Physical Exam Vitals reviewed.  Constitutional:      General: She is not in acute distress.    Appearance: Normal appearance. She is well-developed. She is not ill-appearing.  HENT:     Head: Normocephalic and atraumatic.     Right Ear: Tympanic membrane and ear canal normal.     Left Ear: Tympanic membrane and ear canal normal.     Nose:     Comments: TTP over frontal sinuses  Eyes:     Conjunctiva/sclera: Conjunctivae normal.     Pupils: Pupils are equal, round, and reactive to light.   Neck:     Thyroid : No thyromegaly.   Cardiovascular:     Rate and Rhythm: Normal rate and regular rhythm.     Heart sounds: Normal heart sounds. No murmur heard. Pulmonary:     Effort: Pulmonary effort is normal. No respiratory distress.     Breath sounds: Normal breath sounds.  Abdominal:     General: There is no distension.     Palpations: Abdomen is soft.     Tenderness: There is no abdominal tenderness.   Musculoskeletal:     Cervical back: Normal range of motion and neck supple.  Lymphadenopathy:     Cervical: No cervical adenopathy.   Skin:    General: Skin is warm and dry.   Neurological:     General: No focal deficit present.     Mental Status: She is alert and oriented to person, place, and time.   Psychiatric:        Mood and Affect: Mood normal.        Behavior: Behavior normal.        Thought Content: Thought content normal.            Assessment & Plan:  Elevated BP- new.  Pt's BP was quite high at her GI follow up appt.  She started checking home BP's and they have been very labile.  According to our readings today, her home cuff is not accurate.  Since BP is only mildly elevated here today, will continue to monitor and have her f/u in short order to make sure it is not climbing.  Encouraged increased water, low sodium diet.  Will follow closely.  Frontal sinusitis- new.  Does not appear to be infected but she is quite TTP and suspect there is inflammation.  Start low dose Prednisone  taper.  Reviewed supportive care and red flags that should prompt return.  Pt expressed understanding and is in agreement w/ plan.

## 2023-10-03 ENCOUNTER — Ambulatory Visit: Admitting: Family Medicine

## 2023-10-12 ENCOUNTER — Encounter: Payer: Self-pay | Admitting: Family Medicine

## 2023-10-12 MED ORDER — ZOLPIDEM TARTRATE 10 MG PO TABS: 10.0000 mg | ORAL_TABLET | Freq: Every day | ORAL | 3 refills | Status: DC

## 2023-10-12 MED ORDER — ALPRAZOLAM 0.5 MG PO TABS: 0.5000 mg | ORAL_TABLET | Freq: Two times a day (BID) | ORAL | 1 refills | Status: DC | PRN

## 2023-10-12 NOTE — Telephone Encounter (Signed)
 Requested Prescriptions   Pending Prescriptions Disp Refills   ALPRAZolam  (XANAX ) 0.5 MG tablet 60 tablet 1    Sig: Take 1 tablet (0.5 mg total) by mouth 2 (two) times daily as needed for anxiety.   zolpidem  (AMBIEN ) 10 MG tablet 30 tablet 3    Sig: Take 1 tablet (10 mg total) by mouth at bedtime.     Date of patient request: 10/12/23 Last office visit: 08/26/2023 Upcoming visit: 10/26/2023 Date of last refill: 07/21/23 for Xanax  and 06/14/23 for Ambien  Last refill amount: 30 day supply for both

## 2023-10-26 ENCOUNTER — Encounter: Payer: Self-pay | Admitting: Family Medicine

## 2023-10-26 ENCOUNTER — Ambulatory Visit: Admitting: Family Medicine

## 2023-10-26 VITALS — BP 142/82 | HR 68 | Temp 97.9°F | Ht 64.0 in | Wt 172.0 lb

## 2023-10-26 DIAGNOSIS — R030 Elevated blood-pressure reading, without diagnosis of hypertension: Secondary | ICD-10-CM | POA: Diagnosis not present

## 2023-10-26 DIAGNOSIS — K581 Irritable bowel syndrome with constipation: Secondary | ICD-10-CM | POA: Insufficient documentation

## 2023-10-26 DIAGNOSIS — I10 Essential (primary) hypertension: Secondary | ICD-10-CM | POA: Insufficient documentation

## 2023-10-26 DIAGNOSIS — K589 Irritable bowel syndrome without diarrhea: Secondary | ICD-10-CM | POA: Insufficient documentation

## 2023-10-26 MED ORDER — LINACLOTIDE 72 MCG PO CAPS: 72.0000 ug | ORAL_CAPSULE | Freq: Every day | ORAL | 3 refills | Status: DC

## 2023-10-26 MED ORDER — VALACYCLOVIR HCL 1 G PO TABS: 1000.0000 mg | ORAL_TABLET | Freq: Every day | ORAL | 5 refills | Status: AC

## 2023-10-26 NOTE — Telephone Encounter (Signed)
 Patient states for new medication that was prescribed it is quite expensive and wondering if there are different options or coupons?

## 2023-10-26 NOTE — Progress Notes (Signed)
   Subjective:    Patient ID: Lorraine [name], female    DOB: 14-Apr-1950, 73 y.o.   MRN: [id number]  HPI Elevated BP- BP is again elevated today at 142/78.  Reports she has been running around this morning.  Pt is pleased w/ BP today.  IBS- pt is on daily fiber supplement, stool softener, dicyclomine  BID.  Continues to have sxs daily.  Does not have diarrhea.  Water intake is good.  Review of Systems For ROS see HPI     Objective:   Physical Exam Vitals reviewed.  Constitutional:      General: She is not in acute distress.    Appearance: Normal appearance. She is not ill-appearing.  HENT:     Head: Normocephalic and atraumatic.  Eyes:     Extraocular Movements: Extraocular movements intact.     Conjunctiva/sclera: Conjunctivae normal.  Cardiovascular:     Rate and Rhythm: Normal rate and regular rhythm.  Pulmonary:     Effort: Pulmonary effort is normal. No respiratory distress.  Musculoskeletal:     Cervical back: Neck supple.  Lymphadenopathy:     Cervical: No cervical adenopathy.  Skin:    General: Skin is warm and dry.  Neurological:     General: No focal deficit present.     Mental Status: She is alert and oriented to person, place, and time.  Psychiatric:        Mood and Affect: Mood normal.        Behavior: Behavior normal.        Thought Content: Thought content normal.           Assessment & Plan:  Elevated BP- only minimally elevated today.  She would like to avoid meds if possible.  Encouraged increased water, low salt diet, and regular physical activity.  Will continue to follow.

## 2023-10-26 NOTE — Patient Instructions (Signed)
 Follow up as needed or as scheduled I sent the prescription for Linzess  for your IBS.  If this is too expensive at the pharmacy, please let me know and we'll come up w/ an alternative Continue to drink LOTS of water Continue your fiber supplement and stool softener daily Call with any questions or concerns Stay Safe!  Stay Healthy! Hang in there!

## 2023-10-26 NOTE — Assessment & Plan Note (Signed)
 Deteriorated.  Pt w/ ongoing constipation and daily pain.  Reports that Bentyl  helps w/ daily pain but is not helpful when she has a flare.  Is taking fiber and stool softeners but continues to struggle w/ BM's.  Will try Linzess  for more regular BM's.  Pt aware that if this is too expensive to let me know and we can try something else.  Pt expressed understanding and is in agreement w/ plan.

## 2023-10-28 MED ORDER — LUBIPROSTONE 8 MCG PO CAPS: 8.0000 ug | ORAL_CAPSULE | Freq: Two times a day (BID) | ORAL | 3 refills | Status: DC

## 2023-10-28 NOTE — Telephone Encounter (Signed)
 Copied from CRM #[id number]. Topic: Clinical - Prescription Issue >> Oct 28, 2023  3:19 PM Pinkey ORN wrote: Reason for CRM: linaclotide  (LINZESS ) 72 MCG capsule >> Oct 28, 2023  3:21 PM Pinkey ORN wrote: Patient states the medication is expensive and is wanting to know if there's anything she can be prescribed as an alternative. Please contact patient at [phone]

## 2023-10-28 NOTE — Telephone Encounter (Signed)
 Patient calling in stating medication is quite expensive looking for alternative. Have relayed message to patient that it can take 48-72 for a response.

## 2023-11-01 ENCOUNTER — Encounter: Payer: Self-pay | Admitting: Family Medicine

## 2023-11-01 MED ORDER — TRAMADOL HCL 50 MG PO TABS: 50.0000 mg | ORAL_TABLET | Freq: Three times a day (TID) | ORAL | 0 refills | Status: AC | PRN

## 2023-11-01 NOTE — Telephone Encounter (Signed)
 Patient is asking for more Tramadol  for pain? Please advise, thank you

## 2023-11-09 ENCOUNTER — Other Ambulatory Visit: Payer: Self-pay

## 2023-11-10 ENCOUNTER — Other Ambulatory Visit: Payer: Self-pay

## 2023-11-10 MED ORDER — DICYCLOMINE HCL 10 MG PO CAPS: 10.0000 mg | ORAL_CAPSULE | Freq: Three times a day (TID) | ORAL | 1 refills | Status: DC

## 2023-11-19 DIAGNOSIS — Z23 Encounter for immunization: Secondary | ICD-10-CM | POA: Diagnosis not present

## 2023-11-23 ENCOUNTER — Ambulatory Visit
Admission: RE | Admit: 2023-11-23 | Discharge: 2023-11-23 | Disposition: A | Discharge status: Home / Self Care | Source: Ambulatory Visit | Attending: Hematology and Oncology

## 2023-11-23 DIAGNOSIS — R928 Other abnormal and inconclusive findings on diagnostic imaging of breast: Secondary | ICD-10-CM | POA: Diagnosis not present

## 2023-11-23 DIAGNOSIS — R92323 Mammographic fibroglandular density, bilateral breasts: Secondary | ICD-10-CM | POA: Diagnosis not present

## 2023-11-23 DIAGNOSIS — C50312 Malignant neoplasm of lower-inner quadrant of left female breast: Secondary | ICD-10-CM

## 2023-11-29 ENCOUNTER — Encounter: Payer: Self-pay | Admitting: *Deleted

## 2023-11-29 ENCOUNTER — Inpatient Hospital Stay: Payer: Medicare Other | Attending: Hematology and Oncology | Admitting: Hematology and Oncology

## 2023-11-29 VITALS — BP 138/82 | HR 57 | Temp 98.1°F | Resp 17 | Wt 175.3 lb

## 2023-11-29 DIAGNOSIS — Z006 Encounter for examination for normal comparison and control in clinical research program: Secondary | ICD-10-CM | POA: Insufficient documentation

## 2023-11-29 DIAGNOSIS — Z170 Estrogen receptor positive status [ER+]: Secondary | ICD-10-CM | POA: Insufficient documentation

## 2023-11-29 DIAGNOSIS — C50312 Malignant neoplasm of lower-inner quadrant of left female breast: Secondary | ICD-10-CM

## 2023-11-29 DIAGNOSIS — Z79810 Long term (current) use of selective estrogen receptor modulators (SERMs): Secondary | ICD-10-CM | POA: Diagnosis not present

## 2023-11-29 DIAGNOSIS — G629 Polyneuropathy, unspecified: Secondary | ICD-10-CM | POA: Diagnosis not present

## 2023-11-29 MED ORDER — CYANOCOBALAMIN 1000 MCG/ML IJ SOLN: 1000.0000 ug | INTRAMUSCULAR | 3 refills | Status: AC

## 2023-11-29 NOTE — Assessment & Plan Note (Signed)
 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 COMET clinical trial randomized to active surveillance arm 2. Tamoxifen  20 mg daily started 09/20/2018 stopped 11/01/2018.  Vaginal dryness (Mona Olam treatment 11/16/2018) restarted tamoxifen  5 mg 03/30/2019 discontinued tamoxifen  in August 2021. 3.  Started anastrozole  09/11/2021 (because the mammogram showed increasing calcifications)    Peripheral neuropathy: Unclear etiology.  Takes B12 injections at bariatric office.    Anastrozole  Toxicities: Tolerating it extremely well without any problems or concerns.  She does have bilateral hip discomfort but she has had it even before starting anastrozole  therapy. Patient is keen on stopping anastrozole  therapy at the end of 5 years.   Breast cancer surveillance: Breast exam 11/29/2023: Benign Mammogram 11/23/2023: stable left breast calcifications approximately 8 cm associated with known DCIS, density category B MRI lumbar spine 08/19/2022: Disc and facet degeneration, severe spinal stenosis L4-L5 Bone density 08/10/2022 at physicians: Osteopenia: Recommended calcium  and vitamin D  in addition to weightbearing exercises.   For spinal stenosis she is planning to undergo spinal stimulator implant on a clinical study.   Return to clinic in 1 year for follow-up

## 2023-11-29 NOTE — Progress Notes (Signed)
 Patient Care Team: Mahlon Comer BRAVO, MD as PCP - General (Family Medicine) Mona Vinie BROCKS, MD as PCP - Cardiology (Cardiology) Cindie Ole DASEN, MD as PCP - Electrophysiology (Cardiology) Marne, Kelly Nest, MD as Consulting Physician (Obstetrics and Gynecology) Verta Royden DASEN, [place] as Consulting Physician (Podiatry) Mai Lynwood FALCON, MD as Consulting Physician (Rheumatology) Beverley Evalene BIRCH, MD as Attending Physician (Orthopedic Surgery) Mona Vinie BROCKS, MD as Consulting Physician (Cardiology)  DIAGNOSIS:  Encounter Diagnosis  Name Primary?   Malignant neoplasm of lower-inner quadrant of left breast in female, estrogen receptor positive (HCC) Yes    SUMMARY OF ONCOLOGIC HISTORY: Oncology History  Malignant neoplasm of lower-inner quadrant of left breast in female, estrogen receptor positive (HCC)  07/26/2018 Initial Diagnosis   Screening mammogram detected a 10cm span of calcifications in the left breast, biopsy confirmed intermediate grade DCIS, ER 100%, PR 100%.    07/26/2018 Cancer Staging   Staging form: Breast, AJCC 8th Edition - Clinical: Stage 0 (cTis (DCIS), cN0, cM0, ER+, PR+) - Signed by Crawford Morna Pickle, NP on 07/26/2018   03/20/2019 - 10/2019 Anti-estrogen oral therapy   Tamoxifen , 5mg  daily     CHIEF COMPLIANT: Follow-up on anastrozole   HISTORY OF PRESENT ILLNESS:  History of Present Illness Lorraine [name] is a 73 year old female with breast cancer who presents for follow-up regarding her ongoing treatment and surveillance.  She has been on anastrozole  for two and a half years with good tolerance. Her recent mammogram shows stable findings, possibly smaller and fainter. She is involved in a study related to her breast cancer treatment but lacks detailed information.  In January, she was diagnosed with acute colitis following gastrointestinal issues. She manages irritable bowel syndrome with medications and alternative treatments, noting  improvement over the past eight months. She prefers weekly vitamin B12 injections. Earlier this year, she stopped all medications, including her statin, due to gastrointestinal concerns but resumed them gradually starting in April.  Arthritis affects her quality of life but is not the primary focus of this visit.     ALLERGIES:  is allergic to amitriptyline , tamoxifen , codeine, and nortriptyline .  MEDICATIONS:  Current Outpatient Medications  Medication Sig Dispense Refill   ALPRAZolam  (XANAX ) 0.5 MG tablet Take 1 tablet (0.5 mg total) by mouth 2 (two) times daily as needed for anxiety. 60 tablet 1   anastrozole  (ARIMIDEX ) 1 MG tablet Take 1 tablet (1 mg total) by mouth daily. 90 tablet 3   aspirin EC 81 MG tablet Take 81 mg by mouth daily.     cyanocobalamin  (VITAMIN B12) 1000 MCG/ML injection Inject 1 mL (1,000 mcg total) into the skin once a week. 12 mL 3   dicyclomine  (BENTYL ) 10 MG capsule Take 1 capsule (10 mg total) by mouth 3 (three) times daily before meals. 90 capsule 1   gabapentin  (NEURONTIN ) 300 MG capsule TAKE 1 CAPSULE BY MOUTH 3 TIMES A DAY 90 capsule 0   meloxicam  (MOBIC ) 15 MG tablet Take 1 tablet (15 mg total) by mouth daily. 90 tablet 1   polyvinyl alcohol (LIQUIFILM TEARS) 1.4 % ophthalmic solution Place 1 drop into both eyes as needed for dry eyes.     rosuvastatin  (CRESTOR ) 5 MG tablet Take 1 tablet (5 mg total) by mouth daily. 90 tablet 3   tiZANidine  (ZANAFLEX ) 4 MG tablet Take 4 mg by mouth 3 (three) times daily.     valACYclovir  (VALTREX ) 1000 MG tablet Take 1 tablet (1,000 mg total) by mouth daily. 30 tablet  5   zolpidem  (AMBIEN ) 10 MG tablet Take 1 tablet (10 mg total) by mouth at bedtime. 30 tablet 3   No current facility-administered medications for this visit.    PHYSICAL EXAMINATION: ECOG PERFORMANCE STATUS: 1 - Symptomatic but completely ambulatory  Vitals:   11/29/23 0811  BP: 138/82  Pulse: (!) 57  Resp: 17  Temp: 98.1 F (36.7 C)  SpO2: 100%    Filed Weights   11/29/23 0811  Weight: 175 lb 4.8 oz (79.5 kg)    LABORATORY DATA:  I have reviewed the data as listed    Latest Ref Rng & Units 06/23/2023    8:49 AM 03/28/2023   10:17 AM 03/23/2023    9:00 AM  CMP  Glucose 70 - 99 mg/dL 81  892  827   BUN 6 - 23 mg/dL 17  17  37   Creatinine 0.40 - 1.20 mg/dL 9.25  9.19  9.38   Sodium 135 - 145 mEq/L 139  141  138   Potassium 3.5 - 5.1 mEq/L 4.5  4.0  3.6   Chloride 96 - 112 mEq/L 105  103  104   CO2 19 - 32 mEq/L 24  27  21    Calcium  8.4 - 10.5 mg/dL 89.9  89.9  8.8   Total Protein 6.0 - 8.3 g/dL 7.5  7.4  7.8   Total Bilirubin 0.2 - 1.2 mg/dL 0.9  0.7  1.3   Alkaline Phos 39 - 117 U/L 92  88  170   AST 0 - 37 U/L 24  13  33   ALT 0 - 35 U/L 17  20  31      Lab Results  Component Value Date   WBC 6.3 06/23/2023   HGB 15.5 (H) 06/23/2023   HCT 47.0 (H) 06/23/2023   MCV 86.6 06/23/2023   PLT 269.0 06/23/2023   NEUTROABS 3.1 06/23/2023    ASSESSMENT & PLAN:  Malignant neoplasm of lower-inner quadrant of left breast in female, estrogen receptor positive (HCC) 07/17/2018: Screening mammogram detected a 10cm span of calcifications in the left breast, biopsy confirmed intermediate grade DCIS, ER 100%, PR 100% Stage 0   Current treatment:  1. COMET clinical trial randomized to active surveillance arm 2. Tamoxifen  20 mg daily started 09/20/2018 stopped 11/01/2018.  Vaginal dryness (Mona Olam treatment 11/16/2018) restarted tamoxifen  5 mg 03/30/2019 discontinued tamoxifen  in August 2021. 3.  Started anastrozole  09/11/2021 (because the mammogram showed increasing calcifications)    Peripheral neuropathy: Unclear etiology.  Takes B12 injections at bariatric office.    Anastrozole  Toxicities: Tolerating it extremely well without any problems or concerns.  She does have bilateral hip discomfort but she has had it even before starting anastrozole  therapy. Patient is keen on stopping anastrozole  therapy at the end of 5 years.   Breast  cancer surveillance: Mammogram 11/23/2023: stable left breast calcifications approximately 8 cm associated with known DCIS, density category B MRI lumbar spine 08/19/2022: Disc and facet degeneration, severe spinal stenosis L4-L5 Bone density 08/10/2022 at physicians: Osteopenia: Recommended calcium  and vitamin D  in addition to weightbearing exercises.   For spinal stenosis she is planning to undergo spinal stimulator implant on a clinical study.   Return to clinic in 1 year for follow-up ------------------------------------- Assessment and Plan Assessment & Plan Estrogen receptor positive malignant neoplasm of lower-inner quadrant of left breast Breast cancer stable on anastrozole  for 2.5 years. Mammogram shows no changes. Participating in Comet study. Continued anastrozole  due to lack of data on non-surgical  outcomes and potential benefits. She understands rationale despite desire to discontinue. - Continue anastrozole  1 mg daily. - Schedule annual mammogram for November 23, 2024. - Reassess anastrozole  need annually.      Orders Placed This Encounter  Procedures   MM DIAG BREAST TOMO BILATERAL    Standing Status:   Future    Expected Date:   11/23/2024    Expiration Date:   02/21/2025    Reason for Exam (SYMPTOM  OR DIAGNOSIS REQUIRED):   COMET study annual mammograms    Preferred [facility]    Release to patient:   Immediate   The patient has a good understanding of the overall plan. she agrees with it. she will call with any problems that may develop before the next visit here. Total time spent: 30 mins including face to face time and time spent for planning, charting and co-ordination of care   Naomi MARLA Chad, MD 11/29/23

## 2023-11-29 NOTE — Research (Signed)
 AFT - 25: COMPARING AN OPERATION TO MONITORING, WITH OR WITHOUT ENDOCRINE THERAPY (COMET) FOR LOW RISK DCIS: A PHASE III PROSPECTIVE RANDOMIZED TRIAL   Patient arrives today unaccompanied for her 60 month study visit. The pt's 5 year from randomization date was June 2025.  The pt's month 60 visit was past the 5 year date due to the delaying of her month 54 visit for her GI problems.     PROs: The pt's online questionnaires were completed by the patient.  She was thanked for her support and compliance.   LABS: No labs were required today.   BREAST IMAGING: The pt's bilateral imaging was done on 11/23/23.  Radiologist impression stated stable left breast calcifications with no mammographic evidence of malignancy in the right breast.  Will submit imaging per study protocol.    MEDICATION REVIEW: The pt said that she stopped taking her anastrozole  on 03/23/23 due to her colitis infection.  She stated that she resumed her anastrozole  on 06/14/23.     VITAL SIGNS: Vital signs are collected per study protocol.   MD/PROVIDER VISIT: Patient was seen by Dr Gudena for today's visit.   ADVERSE EVENTS: Patient Lorraine [name] reports AEs as below. Attributions provided by Dr Odean today.   Cycle: 60 month visit   Event Grade Attribution   Date resolved Comments  Arthralgia 2 Unrelated   Ongoing Baseline comorbidity of arthritis. Unchanged per patient  Osteopenia 1 Unrelated   Ongoing Osteopenia, per patient report. Patient gets bone density scans every 2 yrs  Peripheral neuropathy; sensory 2 Unrelated   Ongoing Moderate pain. Pt states that she has had foot issues for years that do not limit her ADLs.   Myalgia  1 Unrelated  Ongoing  Pt states related to over-exertion from exercise.     Pt denies any Covid infections during this reporting period.   Not Evaluated:  Acute Coronary Syndrome, Ischemia Cerebrovascular, and cholesterol high.   Pt denies the following Solicited AE's:  Allergic reaction,  fever, hot flashes, hypertension, nausea, and fracture.     DISPOSITION: Upon completion off all study requirements, patient was taken to the scheduling desk and her next study visit, month 72 was scheduled for 12/05/2024.   Her annual bilateral imaging is scheduled for 11/26/24.  The patient was thanked for their time and continued voluntary participation in this study. Patient Lorraine [name] has been provided direct contact information and is encouraged to contact this Nurse for any needs or questions.  Levon FREDRIK Sandifer RN, BSN, CCRP Clinical Research Nurse Lead 11/29/2023 10:44 AM

## 2023-12-12 ENCOUNTER — Encounter: Payer: Self-pay | Admitting: Family Medicine

## 2023-12-12 ENCOUNTER — Other Ambulatory Visit: Payer: Self-pay

## 2023-12-12 MED ORDER — ALPRAZOLAM 0.5 MG PO TABS: 0.5000 mg | ORAL_TABLET | Freq: Two times a day (BID) | ORAL | 0 refills | Status: DC | PRN

## 2023-12-12 NOTE — Addendum Note (Signed)
 Addended by: Tanashia Ciesla R on: 12/12/2023 06:21 PM   Modules accepted: Orders

## 2023-12-12 NOTE — Telephone Encounter (Signed)
 Refill request received with PCP out of office.  Controlled substance database reviewed.  Alprazolam  0.5 mg #60 last filled on 11/10/2023, previously 10/12/2023, 09/10/2023.  Will refill, but forwarded to PCP for FYI.

## 2023-12-14 ENCOUNTER — Encounter: Payer: Self-pay | Admitting: Internal Medicine

## 2023-12-14 ENCOUNTER — Ambulatory Visit: Attending: Internal Medicine | Admitting: Internal Medicine

## 2023-12-14 VITALS — BP 136/88 | HR 48 | Ht 65.0 in | Wt 172.0 lb

## 2023-12-14 DIAGNOSIS — Q2381 Bicuspid aortic valve: Secondary | ICD-10-CM | POA: Diagnosis not present

## 2023-12-14 DIAGNOSIS — I351 Nonrheumatic aortic (valve) insufficiency: Secondary | ICD-10-CM | POA: Diagnosis not present

## 2023-12-14 DIAGNOSIS — I77810 Thoracic aortic ectasia: Secondary | ICD-10-CM | POA: Diagnosis not present

## 2023-12-14 DIAGNOSIS — E785 Hyperlipidemia, unspecified: Secondary | ICD-10-CM | POA: Diagnosis not present

## 2023-12-14 DIAGNOSIS — R0989 Other specified symptoms and signs involving the circulatory and respiratory systems: Secondary | ICD-10-CM | POA: Insufficient documentation

## 2023-12-14 DIAGNOSIS — I4891 Unspecified atrial fibrillation: Secondary | ICD-10-CM | POA: Diagnosis not present

## 2023-12-14 MED ORDER — LOSARTAN POTASSIUM 25 MG PO TABS: 25.0000 mg | ORAL_TABLET | Freq: Every day | ORAL | 3 refills | Status: AC

## 2023-12-14 NOTE — Progress Notes (Signed)
 OFFICE NOTE  Chief Complaint:  Follow-up aortic valve disease  Primary Care Physician: Lorraine Comer BRAVO, MD  HPI:  Lorraine [name] is a pleasant 73 year old female who works as a IT sales professional at [employer].  She was referred to us  for evaluation of missed or skipped beats. She does not report any awareness of palpitations, however she notices that she occasionally skips beats. This actually may occur on a daily basis. Has been present for many years, however recently was brought to her attention by her primary care doctor. She is actually fairly active, and belongs to the [employer]. She is often swimming or doing land exercises, and does not have any significant limitation to exercise. She denies any chest pain or worsening shortness of breath. Recently she was trying to lose weight and was prescribed phentermine. This did cause marked increase in her palpitations and she discontinued the medicine secondary to that.  07/04/2019  Lorraine [name] is seen today for preoperative clearance.  She is considered a new patient as I last saw her in 2014 for PVCs.  In January 2020 she underwent surgery and postoperatively was found to have A. fib with RVR.  She was taken to [facility] and ultimately converted.  Since then she has been followed in the A. fib clinic by Lorraine Kicks, PA-C.  He did a thorough work-up including an echo and a Myoview  stress test earlier last year.  The stress test was negative for ischemia the echo showed normal LV function however she did have mild aortic insufficiency which was noted on exam.  Symptomatically she is well.  She denies chest pain or worsening shortness of breath however notes significant shoulder pain for which she has been evaluated for surgery.  12/31/2019  Lorraine [name] returns today for follow-up.  She reports a slow recovery after recent shoulder surgery.  In fact she says since then she has been short of breath with minimal exertion.  She says it  seems to be getting worse.  She started doing more exercise but it does not seem to be improving.  She denies any symptoms of atrial fibrillation, in fact questions whether she actually had it although we did have evidence of it.  She has been compliant with her Xarelto .  Labs as of September 15 showed no evidence of anemia with an H&H of 14 and 42.  EKG today shows no atrial fibrillation rather a sinus bradycardia with frequent PVCs.  She says she is unaware of the PVCs.  She does note when she exercises her heart rate elevates.  At times she notes tachypalpitations which can occur with exertion or at rest.  This could be atrial fibrillation.  She has not taken her metoprolol  regularly due to bradycardia and therefore is at risk for breakthrough A. fib with RVR.   11/17/2021  Lorraine [name] returns today for follow-up.  She underwent successful A-fib ablation with Dr. Cindie.  She reports basically resolution of her A-fib symptoms.  Subsequently she had placement of a Watchman left atrial appendage occluder device.  Is now close to 6 months after that and she is done well.  She is maintained on Xarelto  since she cannot tolerate Plavix  and hopes to discontinue the Xarelto  at the end of this month and start low-dose aspirin 81 mg daily.  I reminded her that we also noted that she has an abnormal, bicuspid aortic valve as well as a dilated aortic root.  This will need to be followed  by echocardiography at least annually.  Overall she is doing well and she is asymptomatic.  She would like to reduce medications as much as possible.  She is interested in getting off her statin.  She thinks she is having side effects related to that.  She has been taking it very sparingly but more recently 10 mg daily since June.  12/14/2023  Lorraine [name] returns today for follow-up.  She had a repeat echocardiogram in February at which time her LVEF was slightly lower at 50 to 55% but thought only to have mild AI rather than moderate  previously.  EF on the prior echo year before was 55 to 60%.  The aorta was measured dilated at 42 mm.  This will require follow-up.  She is also concerned about recently having labile blood pressures.  Her PCP noted that her blood pressures ranged from the 90s up to 180s and that she was hesitant to start medication because of potentially worsening lower blood pressure numbers.  Ms. needed was very ill at the beginning of the year apparently with an acute colitis issue.  May have played a role in her lipids including cholesterol which was poorly controlled in April with total 272, triglycerides 85, HDL 60 and LDL 195.  She reportedly had been off of her medications including the statin at that time.  I did review a CT scan of the abdomen and pelvis that she had which indicates diffuse aortic vascular calcification including calcium  at the ostium of both renal arteries and aortoiliac atherosclerosis.  PMHx:  Past Medical History:  Diagnosis Date   Anxiety    Aortic atherosclerosis 02/03/2018   Noted on CT Abd/Pelvis   AR (aortic regurgitation) 07/18/2017   Mild, noted on ECHO   Bilateral cataracts    Bilateral plantar fasciitis    Chronic knee pain    Chronic low back pain    Chronic lumbar radiculopathy 07/14/2017   Mild, L5, noted on electromyography   History of palpitations    2014-- low risk exercise tolerence test, no ischemi, PVC's (09-06-2012)   HSV-1 (herpes simplex virus 1) infection    Cold sore   Hx of colonic polyps    Hyperlipidemia    Insomnia    Lactose intolerance    Left nephrolithiasis 02/03/2018   Nonobstructing, noted on CT Abd/pelvis   Morton's metatarsalgia, neuralgia, or neuroma, bilateral    Osteoarthritis    knees and lumbar, feet   Osteopenia    Peripheral neuropathy    bilateral feet -- burning and stinging   PONV (postoperative nausea and vomiting)    severe   Presence of Watchman left atrial appendage closure device 06/11/2021   Watchman 27mm FLX with  Dr. Cindie   PVC's (premature ventricular contractions)    Systolic murmur    Slight   Vaginal cyst    Wears glasses     Past Surgical History:  Procedure Laterality Date   ANTERIOR CERVICAL DECOMP/DISCECTOMY FUSION  01/2014   C4 -- C6   ATRIAL FIBRILLATION ABLATION N/A 04/09/2021   Procedure: ATRIAL FIBRILLATION ABLATION;  Surgeon: Cindie Ole DASEN, MD;  Location: [facility] INVASIVE CV LAB;  Service: Cardiovascular;  Laterality: N/A;   BREAST BIOPSY Left 07/17/2018   x2   CATARACT EXTRACTION W/ INTRAOCULAR LENS  IMPLANT, BILATERAL  2015   COLONOSCOPY  05/14/2015   CYSTO WITH HYDRODISTENSION N/A 04/14/2018   Procedure: CYSTOSCOPY/HYDRODISTENSION WITH INSTILLATION OF PYRIDIUM  AND MARCAINE , BLADDER BIOPSY WITH FULGERATION 0.5 TO 2 CM;  Surgeon: Nieves,  Donnice, MD;  Location: [facility];  Service: Urology;  Laterality: N/A;   ELBOW DEBRIDEMENT Right 09/10/2010   and tendon debridement and radial tunnel release   EXCISION MORTON'S NEUROMA Bilateral left 02-20-2009/  right & left 05-08-2009   EXCISION VAGINAL CYST N/A 07/01/2016   Procedure: EXCISION VAGINAL CYST;  Surgeon: Kelly Delon Milian, MD;  Location: [facility];  Service: Gynecology;  Laterality: N/A;   LEFT ATRIAL APPENDAGE OCCLUSION N/A 06/11/2021   Procedure: LEFT ATRIAL APPENDAGE OCCLUSION;  Surgeon: Cindie Ole DASEN, MD;  Location: [facility] INVAS[facility];  Service: Cardiovascular;  Laterality: N/A;   PLANTAR FASCIA RELEASE Bilateral left 04-02-2010/  right 04-02-2016   TEE WITHOUT CARDIOVERSION N/A 06/11/2021   Procedure: TRANSESOPHAGEAL ECHOCARDIOGRAM (TEE);  Surgeon: Cindie Ole DASEN, MD;  Location: [facility] INVASIVE CV LAB;  Service: Cardiovascular;  Laterality: N/A;   TONSILLECTOMY AND ADENOIDECTOMY  child   TOTAL ABDOMINAL HYSTERECTOMY  1974   w/  Left salpingoophorectomy and Partial right salpingoophorectomy    FAMHx:  Family History  Problem Relation Age of Onset   Lung cancer Mother         lung   Heart attack Father    Memory loss Father    Cancer Paternal Grandfather        colon   Multiple sclerosis Brother    No history of palpitations in the family.  SOCHx:   reports that she has never smoked. She has never used smokeless tobacco. She reports that she does not currently use alcohol. She reports that she does not use drugs.  ALLERGIES:  Allergies  Allergen Reactions   Amitriptyline  Nausea Only    Bladder retention   Tamoxifen     Codeine Nausea And Vomiting and Rash   Nortriptyline  Rash    Bladder retention     ROS: Pertinent items noted in HPI and remainder of comprehensive ROS otherwise negative.  HOME MEDS: Current Outpatient Medications  Medication Sig Dispense Refill   ALPRAZolam  (XANAX ) 0.5 MG tablet Take 1 tablet (0.5 mg total) by mouth 2 (two) times daily as needed for anxiety. 60 tablet 0   anastrozole  (ARIMIDEX ) 1 MG tablet Take 1 tablet (1 mg total) by mouth daily. 90 tablet 3   aspirin EC 81 MG tablet Take 81 mg by mouth daily.     cyanocobalamin  (VITAMIN B12) 1000 MCG/ML injection Inject 1 mL (1,000 mcg total) into the skin once a week. 12 mL 3   dicyclomine  (BENTYL ) 10 MG capsule Take 1 capsule (10 mg total) by mouth 3 (three) times daily before meals. 90 capsule 1   gabapentin  (NEURONTIN ) 300 MG capsule TAKE 1 CAPSULE BY MOUTH 3 TIMES A DAY 90 capsule 0   losartan (COZAAR) 25 MG tablet Take 1 tablet (25 mg total) by mouth daily. 90 tablet 3   meloxicam  (MOBIC ) 15 MG tablet Take 1 tablet (15 mg total) by mouth daily. 90 tablet 1   polyvinyl alcohol (LIQUIFILM TEARS) 1.4 % ophthalmic solution Place 1 drop into both eyes as needed for dry eyes.     rosuvastatin  (CRESTOR ) 5 MG tablet Take 1 tablet (5 mg total) by mouth daily. 90 tablet 3   tiZANidine  (ZANAFLEX ) 4 MG tablet Take 4 mg by mouth 3 (three) times daily.     valACYclovir  (VALTREX ) 1000 MG tablet Take 1 tablet (1,000 mg total) by mouth daily. 30 tablet 5   zolpidem  (AMBIEN ) 10 MG  tablet Take 1 tablet (10 mg total) by mouth at bedtime. 30  tablet 3   No current facility-administered medications for this visit.    LABS/IMAGING: No results found for this or any previous visit (from the past 48 hours). No results found.  VITALS: BP 136/88   Pulse (!) 48   Ht 5' 5 (1.651 m)   Wt 172 lb (78 kg)   SpO2 96%   BMI 28.62 kg/m   EXAM: General appearance: alert and no distress Neck: no carotid bruit, no JVD and thyroid  not enlarged, symmetric, no tenderness/mass/nodules Lungs: clear to auscultation bilaterally Heart: regular rate and rhythm, S1, S2 normal and diastolic murmur: early diastolic 2/6, blowing at lower left sternal border Abdomen: soft, non-tender; bowel sounds normal; no masses,  no organomegaly Extremities: extremities normal, atraumatic, no cyanosis or edema Pulses: 2+ and symmetric Skin: Skin color, texture, turgor normal. No rashes or lesions Neurologic: Grossly normal  EKG: EKG Interpretation Date/Time:  Wednesday December 14 2023 08:46:38 EDT Ventricular Rate:  48 PR Interval:  154 QRS Duration:  84 QT Interval:  436 QTC Calculation: 389 R Axis:   25  Text Interpretation: Sinus bradycardia Nonspecific ST abnormality When compared with ECG of 03-Oct-2022 10:11, PREVIOUS ECG IS PRESENT No significant change since last tracing Confirmed by Mona Kent [phone]) on 12/14/2023 8:53:17 AM    ASSESSMENT: Labile hypertension Paroxysmal atrial fibrillation-CHA2DS2-VASc score of 2, on Xarelto , s/p afib ablation and LAA occlusion (Watchman) - 05/2021 Frequent PVCs Dyslipidemia Aortic insufficiency, mild to moderate with LVEF 50 to 55% (04/2023) Aorto iliac atherosclerosis and calcification of the bilateral renal artery ostia Biscupid aortic valve - dilated aortic root to 42mm  PLAN: 1.   Ms. Kimball was very ill at the beginning of the year apparently with a colitis.  Abdominal and pelvic CT scan showed diffuse aortic and iliac atherosclerosis with  calcification of the ostial renal arteries.  She has had some labile blood pressure issues recently but previously had no issue with high blood pressure.  I wonder if she may have some renal artery stenosis.  I would like to send her for renal Dopplers.  On average it seems like her blood pressure is still remaining high.  Today was 136/88.  Because she has a dilated aortic root/aneurysm, she may benefit from an ARB which has shown some benefit in slowing the progression of this.  Would recommend starting low-dose losartan 25 mg daily and having her monitor blood pressures at home.  She has follow-up with Dr. Mahlon in about 2 weeks which is a good time to repeat a metabolic profile to make sure her kidney function and potassium on the losartan are okay.  She should also have a repeat lipid testing.  If her cholesterol remains high I would strongly suggest a PCSK9 inhibitor to try to target her cholesterol to be much lower, ideally LDL less than 70.  I will plan a repeat of her echo in February of next year.  Follow-up with me afterwards.  Kent KYM Mona, MD, [facility], FNLA, FACP  [facility]  Medical Director of the Advanced Lipid Disorders &  Cardiovascular Risk Reduction Clinic Diplomate of the American Board of Clinical Lipidology Attending Cardiologist  Direct Dial: [phone]  Fax: [phone]  Website:  [email or web address]  Kent BROCKS Olayinka Gathers 12/14/2023, 1:05 PM

## 2023-12-14 NOTE — Patient Instructions (Signed)
 Medication Instructions:  START losartan 25mg  daily  *If you need a refill on your cardiac medications before your next appointment, please call your pharmacy*  Testing/Procedures: Your physician has requested that you have a renal artery duplex. During this test, an ultrasound is used to evaluate blood flow to the kidneys. Allow one hour for this exam. Do not eat after midnight the day before and avoid carbonated beverages. Take your medications as you usually do.  Your physician has requested that you have an echocardiogram - DUE Feb 2026. Echocardiography is a painless test that uses sound waves to create images of your heart. It provides your doctor with information about the size and shape of your heart and how well your heart's chambers and valves are working. This procedure takes approximately one hour. There are no restrictions for this procedure. Please do NOT wear cologne, perfume, aftershave, or lotions (deodorant is allowed). Please arrive 15 minutes prior to your appointment time.  Please note: We ask at that you not bring children with you during ultrasound (echo/ vascular) testing. Due to room size and safety concerns, children are not allowed in the ultrasound rooms during exams. Our front office staff cannot provide observation of children in our lobby area while testing is being conducted. An adult accompanying a patient to their appointment will only be allowed in the ultrasound room at the discretion of the ultrasound technician under special circumstances. We apologize for any inconvenience.   Follow-Up: At [facility], you and your health needs are our priority.  As part of our continuing mission to provide you with exceptional heart care, our providers are all part of one team.  This team includes your primary Cardiologist (physician) and Advanced Practice Providers or APPs (Physician Assistants and Nurse Practitioners) who all work together to provide you with the  care you need, when you need it.  Your next appointment:    ~ April 2026  We recommend signing up for the patient portal called MyChart.  Sign up information is provided on this After Visit Summary.  MyChart is used to connect with patients for Virtual Visits (Telemedicine).  Patients are able to view lab/test results, encounter notes, upcoming appointments, etc.  Non-urgent messages can be sent to your provider as well.   To learn more about what you can do with MyChart, go to [email or web address].   Other Instructions Tips to Measure your Blood Pressure Correctly Check BP at home 1-2x daily and send readings via MyChart Check BP 1-2 hours after your losartan dose Here's what you can do to ensure a correct reading:  Don't drink a caffeinated beverage or smoke during the 30 minutes before the test.  Sit quietly for five minutes before the test begins.  During the measurement, sit in a chair with your feet on the floor and your arm supported so your elbow is at about heart level.  The inflatable part of the cuff should completely cover at least 80% of your upper arm, and the cuff should be placed on bare skin, not over a shirt.  Don't talk during the measurement.  Have your blood pressure measured twice, with a brief break in between. If the readings are different by 5 points or more, have it done a third time.  In 2017, new guidelines from the American Heart Association, the [employer] of Cardiology, and nine other health organizations lowered the diagnosis of high blood pressure to 130/80 mm Hg or higher for all adults. The guidelines also redefined  the various blood pressure categories to now include normal, elevated, Stage 1 hypertension, Stage 2 hypertension, and hypertensive crisis (see Blood pressure categories).  Blood pressure categories  Blood pressure category SYSTOLIC (upper number)  DIASTOLIC (lower number)  Normal Less than 120 mm Hg and Less than 80 mm Hg   Elevated 120-129 mm Hg and Less than 80 mm Hg  High blood pressure: Stage 1 hypertension 130-139 mm Hg or 80-89 mm Hg  High blood pressure: Stage 2 hypertension 140 mm Hg or higher or 90 mm Hg or higher  Hypertensive crisis (consult your doctor immediately) Higher than 180 mm Hg and/or Higher than 120 mm Hg  Source: American Heart Association and American Stroke Association. For more on getting your blood pressure under control, buy Controlling Your Blood Pressure, a Special Health Report from [facility].   Blood Pressure Log   Date   Time  Blood Pressure  Position  Example: Nov 1 9 AM 124/78 sitting

## 2023-12-20 ENCOUNTER — Other Ambulatory Visit: Payer: Self-pay | Admitting: Podiatry

## 2023-12-26 ENCOUNTER — Ambulatory Visit: Admitting: Family Medicine

## 2023-12-26 ENCOUNTER — Encounter: Payer: Self-pay | Admitting: Family Medicine

## 2023-12-26 VITALS — BP 118/70 | HR 78 | Temp 98.7°F | Ht 65.0 in | Wt 173.2 lb

## 2023-12-26 DIAGNOSIS — R1030 Lower abdominal pain, unspecified: Secondary | ICD-10-CM | POA: Insufficient documentation

## 2023-12-26 DIAGNOSIS — G629 Polyneuropathy, unspecified: Secondary | ICD-10-CM | POA: Diagnosis not present

## 2023-12-26 DIAGNOSIS — E785 Hyperlipidemia, unspecified: Secondary | ICD-10-CM | POA: Diagnosis not present

## 2023-12-26 DIAGNOSIS — I10 Essential (primary) hypertension: Secondary | ICD-10-CM | POA: Diagnosis not present

## 2023-12-26 LAB — LIPID PANEL
Cholesterol: 175 mg/dL (ref 0–200)
HDL: 62.3 mg/dL (ref >39.00)
LDL Cholesterol: 100 mg/dL — ABNORMAL HIGH (ref 0–99)
NonHDL: 112.95
Total CHOL/HDL Ratio: 3
Triglycerides: 66 mg/dL (ref 0.0–149.0)
VLDL: 13.2 mg/dL (ref 0.0–40.0)

## 2023-12-26 LAB — BASIC METABOLIC PANEL WITH GFR
BUN: 23 mg/dL (ref 6–23)
CO2: 25 meq/L (ref 19–32)
Calcium: 9.4 mg/dL (ref 8.4–10.5)
Chloride: 106 meq/L (ref 96–112)
Creatinine, Ser: 0.7 mg/dL (ref 0.40–1.20)
GFR: 85.75 mL/min (ref >60.00)
Glucose, Bld: 88 mg/dL (ref 70–99)
Potassium: 4.3 meq/L (ref 3.5–5.1)
Sodium: 139 meq/L (ref 135–145)

## 2023-12-26 LAB — HEPATIC FUNCTION PANEL
ALT: 14 U/L (ref 0–35)
AST: 18 U/L (ref 0–37)
Albumin: 4.6 g/dL (ref 3.5–5.2)
Alkaline Phosphatase: 99 U/L (ref 39–117)
Bilirubin, Direct: 0.1 mg/dL (ref 0.0–0.3)
Total Bilirubin: 0.5 mg/dL (ref 0.2–1.2)
Total Protein: 7 g/dL (ref 6.0–8.3)

## 2023-12-26 LAB — CBC WITH DIFFERENTIAL/PLATELET
Basophils Absolute: 0.1 K/uL (ref 0.0–0.1)
Basophils Relative: 1 % (ref 0.0–3.0)
Eosinophils Absolute: 0.5 K/uL (ref 0.0–0.7)
Eosinophils Relative: 7.2 % — ABNORMAL HIGH (ref 0.0–5.0)
HCT: 42.6 % (ref 36.0–46.0)
Hemoglobin: 13.7 g/dL (ref 12.0–15.0)
Lymphocytes Relative: 24.9 % (ref 12.0–46.0)
Lymphs Abs: 1.9 K/uL (ref 0.7–4.0)
MCHC: 32.1 g/dL (ref 30.0–36.0)
MCV: 86 fl (ref 78.0–100.0)
Monocytes Absolute: 0.5 K/uL (ref 0.1–1.0)
Monocytes Relative: 6.3 % (ref 3.0–12.0)
Neutro Abs: 4.6 K/uL (ref 1.4–7.7)
Neutrophils Relative %: 60.6 % (ref 43.0–77.0)
Platelets: 299 K/uL (ref 150.0–400.0)
RBC: 4.95 Mil/uL (ref 3.87–5.11)
RDW: 15.1 % (ref 11.5–15.5)
WBC: 7.6 K/uL (ref 4.0–10.5)

## 2023-12-26 LAB — TSH: TSH: 1.48 u[IU]/mL (ref 0.35–5.50)

## 2023-12-26 MED ORDER — TRAMADOL HCL 50 MG PO TABS: 50.0000 mg | ORAL_TABLET | Freq: Three times a day (TID) | ORAL | 0 refills | Status: AC | PRN

## 2023-12-26 MED ORDER — GABAPENTIN 300 MG PO CAPS: 300.0000 mg | ORAL_CAPSULE | Freq: Three times a day (TID) | ORAL | 3 refills | Status: AC

## 2023-12-26 MED ORDER — DIAZEPAM 2 MG PO TABS: 2.0000 mg | ORAL_TABLET | Freq: Two times a day (BID) | ORAL | 1 refills | Status: DC | PRN

## 2023-12-26 NOTE — Assessment & Plan Note (Signed)
 Ongoing issue for pt.  She has been following w/ GI but has not found any relief w/ multiple txs.  Amitiza  worsened abd pain.  Is taking Pre and Probiotics but not noticing much improvement.  Minimal improvement w/ Bentyl .  Some relief w/ Tramadol - asking for refill.  No improvement on Tylenol .  She has done 'lots of research' and wants to try Valium  to improve GI sxs.  Prescription sent for low dose Valium .  Will follow.

## 2023-12-26 NOTE — Assessment & Plan Note (Signed)
 Chronic problem.  Well controlled today on Losartan 25mg  daily.  Currently asymptomatic.  Check labs due to ARB use but no anticipated med changes.

## 2023-12-26 NOTE — Progress Notes (Signed)
   Subjective:    Patient ID: Lorraine [name], female    DOB: 25-Nov-1950, 73 y.o.   MRN: [id number]  HPI Hyperlipidemia- chronic problem, on Crestor  5mg  daily.  Ongoing abd pain.  Has been following w/ GI.  Amitiza  worsened sxs.  Is now taking Pre and Probiotics.  Some relief w/ Tramadol .  No relief w/ Tylenol .  Has been doing research and wonders if Valium  would improve GI sxs.  HTN- ongoing issue.  Started on Losartan 25mg  daily by Dr Mona.  Denies CP, SOB, HA's, visual changes, edema.  Neuropathy- ongoing issue.  On Gabapentin  per Dr Verta at [facility].  Wants me to assume prescribing responsibility for this as she does not want to return to Podiatry.    Review of Systems     Objective:   Physical Exam Vitals reviewed.  Constitutional:      General: She is not in acute distress.    Appearance: Normal appearance. She is well-developed. She is not ill-appearing.  HENT:     Head: Normocephalic and atraumatic.  Eyes:     Conjunctiva/sclera: Conjunctivae normal.     Pupils: Pupils are equal, round, and reactive to light.  Neck:     Thyroid : No thyromegaly.  Cardiovascular:     Rate and Rhythm: Normal rate and regular rhythm.     Pulses: Normal pulses.     Heart sounds: Normal heart sounds. No murmur heard. Pulmonary:     Effort: Pulmonary effort is normal. No respiratory distress.     Breath sounds: Normal breath sounds.  Abdominal:     General: There is no distension.     Palpations: Abdomen is soft.     Tenderness: There is no abdominal tenderness.  Musculoskeletal:     Cervical back: Normal range of motion and neck supple.     Right lower leg: No edema.     Left lower leg: No edema.  Lymphadenopathy:     Cervical: No cervical adenopathy.  Skin:    General: Skin is warm and dry.  Neurological:     Mental Status: She is alert and oriented to person, place, and time.  Psychiatric:        Behavior: Behavior normal.           Assessment & Plan:

## 2023-12-26 NOTE — Assessment & Plan Note (Signed)
 Chronic problem, on Crestor  5mg  daily.  Check labs.  Adjust meds prn

## 2023-12-26 NOTE — Assessment & Plan Note (Signed)
 Chronic problem.  Was following w/ podiatry and taking Gabapentin  but they want her to f/u in order to keep prescribing and she doesn't see the need since everything is stable.  Asking me to write her prescriptions.  Refill send to pharmacy.

## 2023-12-26 NOTE — Patient Instructions (Addendum)
 Follow up in 2 months to recheck abd pain We'll notify you of your lab results and make any changes if needed Continue the Tramadol  as needed for pain START the Valium  for GI symptom relief Continue your Pre and Probiotics Drink LOTS of fluids Call with any questions or concerns Hang in there!

## 2023-12-27 ENCOUNTER — Encounter: Payer: Self-pay | Admitting: Internal Medicine

## 2023-12-27 ENCOUNTER — Encounter: Payer: Self-pay | Admitting: Family Medicine

## 2023-12-27 ENCOUNTER — Ambulatory Visit: Payer: Self-pay | Admitting: Family Medicine

## 2023-12-27 DIAGNOSIS — Z961 Presence of intraocular lens: Secondary | ICD-10-CM | POA: Diagnosis not present

## 2023-12-27 DIAGNOSIS — H5203 Hypermetropia, bilateral: Secondary | ICD-10-CM | POA: Diagnosis not present

## 2023-12-27 DIAGNOSIS — H524 Presbyopia: Secondary | ICD-10-CM | POA: Diagnosis not present

## 2023-12-27 DIAGNOSIS — H353132 Nonexudative age-related macular degeneration, bilateral, intermediate dry stage: Secondary | ICD-10-CM | POA: Diagnosis not present

## 2023-12-27 NOTE — Progress Notes (Signed)
 Lab results have been discussed.   Verbalized understanding? Yes  Are there any questions? No

## 2024-01-13 ENCOUNTER — Ambulatory Visit (INDEPENDENT_AMBULATORY_CARE_PROVIDER_SITE_OTHER)

## 2024-01-13 DIAGNOSIS — R0989 Other specified symptoms and signs involving the circulatory and respiratory systems: Secondary | ICD-10-CM | POA: Diagnosis not present

## 2024-01-17 ENCOUNTER — Encounter: Payer: Self-pay | Admitting: Internal Medicine

## 2024-01-18 ENCOUNTER — Ambulatory Visit: Payer: Self-pay | Admitting: Internal Medicine

## 2024-01-18 ENCOUNTER — Telehealth: Payer: Self-pay

## 2024-01-18 MED ORDER — DICYCLOMINE HCL 10 MG PO CAPS: 10.0000 mg | ORAL_CAPSULE | Freq: Three times a day (TID) | ORAL | 1 refills | Status: AC

## 2024-01-18 MED ORDER — MELOXICAM 15 MG PO TABS: 15.0000 mg | ORAL_TABLET | Freq: Every day | ORAL | 1 refills | Status: AC

## 2024-01-18 NOTE — Addendum Note (Signed)
 Addended by: HONOR BERN A on: 01/18/2024 11:41 AM   Modules accepted: Orders

## 2024-01-18 NOTE — Telephone Encounter (Signed)
 Patient is requesting refill on Meloxicam . Okay to refill?

## 2024-01-19 ENCOUNTER — Encounter: Payer: Self-pay | Admitting: Family Medicine

## 2024-01-19 DIAGNOSIS — M47816 Spondylosis without myelopathy or radiculopathy, lumbar region: Secondary | ICD-10-CM | POA: Diagnosis not present

## 2024-01-27 DIAGNOSIS — H353132 Nonexudative age-related macular degeneration, bilateral, intermediate dry stage: Secondary | ICD-10-CM | POA: Diagnosis not present

## 2024-02-08 ENCOUNTER — Encounter: Payer: Self-pay | Admitting: Family Medicine

## 2024-02-13 NOTE — Telephone Encounter (Signed)
 Requested Prescriptions   Pending Prescriptions Disp Refills   zolpidem  (AMBIEN ) 10 MG tablet 30 tablet 3    Sig: Take 1 tablet (10 mg total) by mouth at bedtime.     Date of patient request: 02/13/24 Last office visit: 12/26/2023 Upcoming visit: 02/24/2024 Date of last refill:  Last refill amount: 30 w/ 3 refills

## 2024-02-14 ENCOUNTER — Ambulatory Visit: Admitting: Podiatry

## 2024-02-14 VITALS — Ht 65.0 in | Wt 173.2 lb

## 2024-02-14 DIAGNOSIS — D2371 Other benign neoplasm of skin of right lower limb, including hip: Secondary | ICD-10-CM

## 2024-02-14 DIAGNOSIS — M7751 Other enthesopathy of right foot: Secondary | ICD-10-CM

## 2024-02-14 MED ORDER — ZOLPIDEM TARTRATE 10 MG PO TABS: 10.0000 mg | ORAL_TABLET | Freq: Every day | ORAL | 3 refills | Status: AC

## 2024-02-14 MED ORDER — DEXAMETHASONE SODIUM PHOSPHATE 120 MG/30ML IJ SOLN
2.0000 mg | Freq: Once | INTRAMUSCULAR | Status: AC
  Administered 2024-02-14: 2 mg via INTRA_ARTICULAR

## 2024-02-14 NOTE — Progress Notes (Signed)
 She presents today chief complaint of painful lesion lateral aspect of her fifth metatarsal head right foot.  States that she only wears rain boots and most of the time 3 different colors of [employer] shoes.  She states with her neuropathy that is really all she can wear.  She says this lesions been here for quite some time and very painful.  Objective: Vital signs are stable alert oriented x 3 she has a reactive benign skin lesion lateral aspect of the right foot with underlying bursa.  There is fluctuance beneath the lesion consistent with bursitis more than likely resulting in her painful symptomatology.  Assessment: Bursitis of the fifth metatarsal lateral head.  Painful benign skin neoplasm.  Plan: I injected dexamethasone  and local anesthetic today and debrided the painful benign skin lesion.  I explained to her how this could be preulcerative she understands that it is will do her best to prevent that from happening.

## 2024-02-16 DIAGNOSIS — G8929 Other chronic pain: Secondary | ICD-10-CM | POA: Diagnosis not present

## 2024-02-16 DIAGNOSIS — M47816 Spondylosis without myelopathy or radiculopathy, lumbar region: Secondary | ICD-10-CM | POA: Diagnosis not present

## 2024-02-16 DIAGNOSIS — M25561 Pain in right knee: Secondary | ICD-10-CM | POA: Diagnosis not present

## 2024-02-16 DIAGNOSIS — M7062 Trochanteric bursitis, left hip: Secondary | ICD-10-CM | POA: Diagnosis not present

## 2024-02-24 ENCOUNTER — Ambulatory Visit: Admitting: Family Medicine

## 2024-02-24 ENCOUNTER — Encounter: Payer: Self-pay | Admitting: Family Medicine

## 2024-02-24 VITALS — BP 132/76 | HR 4 | Temp 97.6°F | Ht 64.75 in | Wt 168.0 lb

## 2024-02-24 DIAGNOSIS — H9202 Otalgia, left ear: Secondary | ICD-10-CM | POA: Diagnosis not present

## 2024-02-24 DIAGNOSIS — I10 Essential (primary) hypertension: Secondary | ICD-10-CM | POA: Diagnosis not present

## 2024-02-24 DIAGNOSIS — R1030 Lower abdominal pain, unspecified: Secondary | ICD-10-CM | POA: Diagnosis not present

## 2024-02-24 DIAGNOSIS — R4589 Other symptoms and signs involving emotional state: Secondary | ICD-10-CM

## 2024-02-24 MED ORDER — TRAMADOL HCL 50 MG PO TABS: 50.0000 mg | ORAL_TABLET | Freq: Two times a day (BID) | ORAL | 0 refills | Status: DC

## 2024-02-24 MED ORDER — ALPRAZOLAM 0.5 MG PO TABS: 0.5000 mg | ORAL_TABLET | Freq: Two times a day (BID) | ORAL | 0 refills | Status: AC | PRN

## 2024-02-24 NOTE — Patient Instructions (Signed)
 Follow up in April for blood pressure and cholesteorl INCREASE the Tramadol  to twice daily When you get to your last few days of medication, send me a message and let me know so I can refill it for you Continue to drink lots of fluids and eat regularly Thankfully your ear looks good! Call with any questions or concerns Stay Safe!  Stay Healthy! Happy Holidays!!!

## 2024-02-24 NOTE — Progress Notes (Unsigned)
° °  Subjective:    Patient ID: Lorraine [name], female    DOB: 07-15-1950, 73 y.o.   MRN: [id number]  HPI HTN- chronic problem, on Losartan  25mg  daily w/ adequate control.  Denies CP, SOB, HA's, visual changes, edema.  Abd pain- ongoing issue.  At last visit we started Diazepam  in hopes of controlling abd sxs.  Doesn't feel that Valium  did anything to improve sxs.  Tramadol  daily improved abd pain.  There were days she wanted to increase to twice daily but she was fearful of running out of medication.  L ear pain- started yesterday.  Feels full.  No drainage.  No fevers.  Denies congestion   Review of Systems For ROS see HPI     Objective:   Physical Exam Vitals reviewed.  Constitutional:      General: She is not in acute distress.    Appearance: She is well-developed. She is not ill-appearing.  HENT:     Head: Normocephalic and atraumatic.     Right Ear: Tympanic membrane and ear canal normal.     Left Ear: Tympanic membrane and ear canal normal.  Eyes:     Conjunctiva/sclera: Conjunctivae normal.     Pupils: Pupils are equal, round, and reactive to light.  Neck:     Thyroid : No thyromegaly.  Cardiovascular:     Rate and Rhythm: Normal rate and regular rhythm.     Heart sounds: Normal heart sounds. No murmur heard. Pulmonary:     Effort: Pulmonary effort is normal. No respiratory distress.     Breath sounds: Normal breath sounds.  Abdominal:     General: There is no distension.     Palpations: Abdomen is soft.     Tenderness: There is no abdominal tenderness.  Musculoskeletal:     Cervical back: Normal range of motion and neck supple.  Lymphadenopathy:     Cervical: No cervical adenopathy.  Skin:    General: Skin is warm and dry.  Neurological:     Mental Status: She is alert and oriented to person, place, and time.  Psychiatric:        Behavior: Behavior normal.           Assessment & Plan:  L ear pain- new.  No evidence of infxn or eustachian tube dysfxn on  PE.  Since sxs just started yesterday, encouraged pt to allow more time to see if something declares itself.  At this time, no need for abx.  Pt expressed understanding and is in agreement w/ plan.

## 2024-02-25 NOTE — Assessment & Plan Note (Signed)
 Since we are stopping Valium , pt is requesting a refill on Alprazolam  to use prn.  Prescription sent

## 2024-02-25 NOTE — Assessment & Plan Note (Signed)
 Chronic problem.  On Losartan  w/ adequate control and currently asymptomatic.  Reviewed recent labs.  No need to repeat.  Will follow.

## 2024-02-25 NOTE — Assessment & Plan Note (Signed)
 Ongoing issue for pt.  No relief w/ Valium  but does have consistent- yet temporary- relief w/ Tramadol .  Will increase to BID dosing to allow her more pain free episodes and ability to eat normally.  Pt expressed understanding and is in agreement w/ plan.

## 2024-03-07 ENCOUNTER — Encounter: Payer: Self-pay | Admitting: Family Medicine

## 2024-03-09 NOTE — Telephone Encounter (Signed)
 Patient scheduled appt for 03/14/24 - FYI

## 2024-03-14 ENCOUNTER — Ambulatory Visit (INDEPENDENT_AMBULATORY_CARE_PROVIDER_SITE_OTHER): Admitting: Family Medicine

## 2024-03-14 ENCOUNTER — Encounter: Payer: Self-pay | Admitting: Family Medicine

## 2024-03-14 VITALS — BP 148/80 | HR 62 | Temp 98.0°F | Ht 64.5 in | Wt 173.0 lb

## 2024-03-14 DIAGNOSIS — J329 Chronic sinusitis, unspecified: Secondary | ICD-10-CM

## 2024-03-14 DIAGNOSIS — B9689 Other specified bacterial agents as the cause of diseases classified elsewhere: Secondary | ICD-10-CM

## 2024-03-14 MED ORDER — ALBUTEROL SULFATE HFA 108 (90 BASE) MCG/ACT IN AERS: 2.0000 | INHALATION_SPRAY | Freq: Four times a day (QID) | RESPIRATORY_TRACT | 0 refills | Status: AC | PRN

## 2024-03-14 MED ORDER — AMOXICILLIN 875 MG PO TABS: 875.0000 mg | ORAL_TABLET | Freq: Two times a day (BID) | ORAL | 0 refills | Status: AC

## 2024-03-14 NOTE — Progress Notes (Signed)
" ° °  Subjective:    Patient ID: Lorraine [name], female    DOB: [birth year], 73 y.o.   MRN: [id number]  HPI URI- sxs started w/ a cough prior to Christmas.  Then developed head congestion, HA.  + frontal/maxillary sinus pain.  + chest congestion w/ 'labored breathing'.  No fever.  Some chills yesterday.  Denies body aches.  Has been taking DayQuil/Nyquil and Albuterol  inhaler w/ some relief.     Review of Systems For ROS see HPI     Objective:   Physical Exam Vitals reviewed.  Constitutional:      General: She is not in acute distress.    Appearance: Normal appearance. She is well-developed. She is not ill-appearing.  HENT:     Head: Normocephalic and atraumatic.     Right Ear: Tympanic membrane normal.     Left Ear: Tympanic membrane normal.     Nose: Mucosal edema and congestion present. No rhinorrhea.     Right Sinus: Maxillary sinus tenderness and frontal sinus tenderness present.     Left Sinus: Maxillary sinus tenderness and frontal sinus tenderness present.     Mouth/Throat:     Pharynx: Uvula midline. Posterior oropharyngeal erythema present. No oropharyngeal exudate.  Eyes:     Conjunctiva/sclera: Conjunctivae normal.     Pupils: Pupils are equal, round, and reactive to light.  Cardiovascular:     Rate and Rhythm: Normal rate and regular rhythm.     Heart sounds: Normal heart sounds.  Pulmonary:     Effort: Pulmonary effort is normal. No respiratory distress.     Breath sounds: Normal breath sounds. No wheezing.  Musculoskeletal:     Cervical back: Normal range of motion and neck supple.  Lymphadenopathy:     Cervical: No cervical adenopathy.  Skin:    General: Skin is warm and dry.  Neurological:     General: No focal deficit present.     Mental Status: She is alert and oriented to person, place, and time.     Cranial Nerves: No cranial nerve deficit.     Motor: No weakness.     Coordination: Coordination normal.  Psychiatric:        Mood and Affect: Mood normal.         Behavior: Behavior normal.        Thought Content: Thought content normal.           Assessment & Plan:  Bacterial sinusitis- new.  Pt's sxs and PE consistent w/ infxn.  Start Amoxicillin  BID.  Reviewed supportive care and red flags that should prompt return.  Pt expressed understanding and is in agreement w/ plan.   "

## 2024-03-14 NOTE — Patient Instructions (Signed)
 Follow up as needed or as scheduled START the Amoxicillin  twice daily- take w/ food USE the albuterol  inhaler as needed for cough or wheezing Continue DayQuil or Nyquil for symptom relief Call with any questions or concerns Hang in there! Happy New Year!!!

## 2024-03-21 ENCOUNTER — Encounter: Payer: Self-pay | Admitting: Family Medicine

## 2024-03-22 MED ORDER — TRAMADOL HCL 50 MG PO TABS: 50.0000 mg | ORAL_TABLET | Freq: Two times a day (BID) | ORAL | 0 refills | Status: AC

## 2024-03-22 NOTE — Telephone Encounter (Signed)
 Requested Prescriptions   Pending Prescriptions Disp Refills   traMADol  (ULTRAM ) 50 MG tablet 60 tablet 0    Sig: Take 1 tablet (50 mg total) by mouth 2 (two) times daily.     Date of patient request: 03/22/2024 Last office visit: 03/14/2024 Upcoming visit: 06/20/2024 Date of last refill: 02/24/2024 Last refill amount: 60 tab 0 refill     Please view mychart message from patient below as well. She has all good comments regarding her care.

## 2024-04-16 ENCOUNTER — Encounter: Payer: Self-pay | Admitting: Internal Medicine

## 2024-04-17 ENCOUNTER — Encounter (HOSPITAL_BASED_OUTPATIENT_CLINIC_OR_DEPARTMENT_OTHER): Payer: Self-pay

## 2024-04-17 ENCOUNTER — Other Ambulatory Visit (HOSPITAL_BASED_OUTPATIENT_CLINIC_OR_DEPARTMENT_OTHER)

## 2024-05-18 ENCOUNTER — Other Ambulatory Visit (HOSPITAL_BASED_OUTPATIENT_CLINIC_OR_DEPARTMENT_OTHER)

## 2024-06-20 ENCOUNTER — Ambulatory Visit: Admitting: Family Medicine

## 2024-11-26 ENCOUNTER — Encounter

## 2024-12-05 ENCOUNTER — Ambulatory Visit: Admitting: Hematology and Oncology
